# Patient Record
Sex: Male | Born: 1940 | Race: White | Hispanic: No | Marital: Married | Smoking: Former smoker
Health system: Southern US, Community
[De-identification: ages and names within clinical notes are randomized; demographics above are authoritative.]

## PROBLEM LIST (undated history)

## (undated) DIAGNOSIS — R05 Cough: Secondary | ICD-10-CM

## (undated) DIAGNOSIS — E785 Hyperlipidemia, unspecified: Secondary | ICD-10-CM

## (undated) DIAGNOSIS — I10 Essential (primary) hypertension: Secondary | ICD-10-CM

## (undated) DIAGNOSIS — I2510 Atherosclerotic heart disease of native coronary artery without angina pectoris: Secondary | ICD-10-CM

## (undated) DIAGNOSIS — Z86010 Personal history of colonic polyps: Secondary | ICD-10-CM

## (undated) DIAGNOSIS — R011 Cardiac murmur, unspecified: Secondary | ICD-10-CM

## (undated) DIAGNOSIS — I4891 Unspecified atrial fibrillation: Secondary | ICD-10-CM

## (undated) DIAGNOSIS — IMO0002 Reserved for concepts with insufficient information to code with codable children: Secondary | ICD-10-CM

## (undated) DIAGNOSIS — M545 Low back pain: Secondary | ICD-10-CM

## (undated) DIAGNOSIS — C801 Malignant (primary) neoplasm, unspecified: Secondary | ICD-10-CM

## (undated) DIAGNOSIS — M1990 Unspecified osteoarthritis, unspecified site: Secondary | ICD-10-CM

## (undated) DIAGNOSIS — N320 Bladder-neck obstruction: Secondary | ICD-10-CM

## (undated) DIAGNOSIS — J309 Allergic rhinitis, unspecified: Secondary | ICD-10-CM

## (undated) DIAGNOSIS — K219 Gastro-esophageal reflux disease without esophagitis: Secondary | ICD-10-CM

## (undated) DIAGNOSIS — R110 Nausea: Secondary | ICD-10-CM

## (undated) DIAGNOSIS — Z85828 Personal history of other malignant neoplasm of skin: Secondary | ICD-10-CM

## (undated) DIAGNOSIS — R9389 Abnormal findings on diagnostic imaging of other specified body structures: Secondary | ICD-10-CM

## (undated) DIAGNOSIS — R0609 Other forms of dyspnea: Secondary | ICD-10-CM

## (undated) DIAGNOSIS — R498 Other voice and resonance disorders: Secondary | ICD-10-CM

## (undated) DIAGNOSIS — IMO0001 Reserved for inherently not codable concepts without codable children: Secondary | ICD-10-CM

## (undated) DIAGNOSIS — R002 Palpitations: Secondary | ICD-10-CM

## (undated) DIAGNOSIS — J310 Chronic rhinitis: Secondary | ICD-10-CM

## (undated) DIAGNOSIS — R7309 Other abnormal glucose: Secondary | ICD-10-CM

## (undated) DIAGNOSIS — J338 Other polyp of sinus: Secondary | ICD-10-CM

## (undated) DIAGNOSIS — R0989 Other specified symptoms and signs involving the circulatory and respiratory systems: Secondary | ICD-10-CM

## (undated) DIAGNOSIS — E559 Vitamin D deficiency, unspecified: Secondary | ICD-10-CM

## (undated) DIAGNOSIS — R1011 Right upper quadrant pain: Secondary | ICD-10-CM

## (undated) DIAGNOSIS — T7840XA Allergy, unspecified, initial encounter: Secondary | ICD-10-CM

## (undated) DIAGNOSIS — K4090 Unilateral inguinal hernia, without obstruction or gangrene, not specified as recurrent: Secondary | ICD-10-CM

## (undated) DIAGNOSIS — B079 Viral wart, unspecified: Secondary | ICD-10-CM

## (undated) DIAGNOSIS — T50995A Adverse effect of other drugs, medicaments and biological substances, initial encounter: Secondary | ICD-10-CM

## (undated) DIAGNOSIS — R42 Dizziness and giddiness: Secondary | ICD-10-CM

## (undated) DIAGNOSIS — H269 Unspecified cataract: Secondary | ICD-10-CM

## (undated) DIAGNOSIS — M549 Dorsalgia, unspecified: Secondary | ICD-10-CM

## (undated) DIAGNOSIS — N400 Enlarged prostate without lower urinary tract symptoms: Secondary | ICD-10-CM

## (undated) DIAGNOSIS — I6529 Occlusion and stenosis of unspecified carotid artery: Secondary | ICD-10-CM

## (undated) DIAGNOSIS — K469 Unspecified abdominal hernia without obstruction or gangrene: Secondary | ICD-10-CM

## (undated) HISTORY — PX: BONY PELVIS SURGERY: SHX572

## (undated) HISTORY — PX: POLYPECTOMY: SHX149

## (undated) HISTORY — DX: Other specified symptoms and signs involving the circulatory and respiratory systems: R09.89

## (undated) HISTORY — PX: OTHER SURGICAL HISTORY: SHX169

## (undated) HISTORY — DX: Unilateral inguinal hernia, without obstruction or gangrene, not specified as recurrent: K40.90

## (undated) HISTORY — DX: Unspecified abdominal hernia without obstruction or gangrene: K46.9

## (undated) HISTORY — DX: Chronic rhinitis: J31.0

## (undated) HISTORY — DX: Personal history of colonic polyps: Z86.010

## (undated) HISTORY — DX: Essential (primary) hypertension: I10

## (undated) HISTORY — PX: HERNIA REPAIR: SHX51

## (undated) HISTORY — DX: Abnormal findings on diagnostic imaging of other specified body structures: R93.89

## (undated) HISTORY — DX: Occlusion and stenosis of unspecified carotid artery: I65.29

## (undated) HISTORY — DX: Dorsalgia, unspecified: M54.9

## (undated) HISTORY — DX: Adverse effect of other drugs, medicaments and biological substances, initial encounter: T50.995A

## (undated) HISTORY — DX: Vitamin D deficiency, unspecified: E55.9

## (undated) HISTORY — DX: Unspecified cataract: H26.9

## (undated) HISTORY — DX: Malignant (primary) neoplasm, unspecified: C80.1

## (undated) HISTORY — DX: Dizziness and giddiness: R42

## (undated) HISTORY — DX: Other voice and resonance disorders: R49.8

## (undated) HISTORY — DX: Personal history of other malignant neoplasm of skin: Z85.828

## (undated) HISTORY — DX: Atherosclerotic heart disease of native coronary artery without angina pectoris: I25.10

## (undated) HISTORY — DX: Other polyp of sinus: J33.8

## (undated) HISTORY — PX: COLONOSCOPY: SHX174

## (undated) HISTORY — PX: TONSILLECTOMY: SUR1361

## (undated) HISTORY — DX: Right upper quadrant pain: R10.11

## (undated) HISTORY — DX: Benign prostatic hyperplasia without lower urinary tract symptoms: N40.0

## (undated) HISTORY — DX: Gastro-esophageal reflux disease without esophagitis: K21.9

## (undated) HISTORY — DX: Reserved for inherently not codable concepts without codable children: IMO0001

## (undated) HISTORY — DX: Hyperlipidemia, unspecified: E78.5

## (undated) HISTORY — DX: Allergic rhinitis, unspecified: J30.9

## (undated) HISTORY — PX: CATARACT EXTRACTION: SUR2

## (undated) HISTORY — DX: Nausea: R11.0

## (undated) HISTORY — DX: Reserved for concepts with insufficient information to code with codable children: IMO0002

## (undated) HISTORY — DX: Unspecified atrial fibrillation: I48.91

## (undated) HISTORY — DX: Allergy, unspecified, initial encounter: T78.40XA

## (undated) HISTORY — DX: Cardiac murmur, unspecified: R01.1

## (undated) HISTORY — DX: Viral wart, unspecified: B07.9

## (undated) HISTORY — DX: Low back pain: M54.5

## (undated) HISTORY — DX: Cough: R05

## (undated) HISTORY — PX: LUMBAR SPINE SURGERY: SHX701

## (undated) HISTORY — PX: BACK SURGERY: SHX140

## (undated) HISTORY — DX: Palpitations: R00.2

## (undated) HISTORY — DX: Other forms of dyspnea: R06.09

## (undated) HISTORY — DX: Bladder-neck obstruction: N32.0

## (undated) HISTORY — DX: Other abnormal glucose: R73.09

## (undated) HISTORY — DX: Unspecified osteoarthritis, unspecified site: M19.90

---

## 1992-08-06 DIAGNOSIS — C4491 Basal cell carcinoma of skin, unspecified: Secondary | ICD-10-CM

## 1992-08-06 HISTORY — DX: Basal cell carcinoma of skin, unspecified: C44.91

## 1992-09-13 DIAGNOSIS — D229 Melanocytic nevi, unspecified: Secondary | ICD-10-CM

## 1992-09-13 HISTORY — DX: Melanocytic nevi, unspecified: D22.9

## 1992-10-08 DIAGNOSIS — D229 Melanocytic nevi, unspecified: Secondary | ICD-10-CM

## 1992-10-08 HISTORY — DX: Melanocytic nevi, unspecified: D22.9

## 2004-03-01 ENCOUNTER — Emergency Department (HOSPITAL_COMMUNITY): Admission: EM | Admit: 2004-03-01 | Discharge: 2004-03-01 | Payer: Self-pay | Admitting: Emergency Medicine

## 2004-03-03 ENCOUNTER — Emergency Department (HOSPITAL_COMMUNITY): Admission: EM | Admit: 2004-03-03 | Discharge: 2004-03-03 | Payer: Self-pay | Admitting: Emergency Medicine

## 2005-04-12 ENCOUNTER — Ambulatory Visit: Payer: Self-pay | Admitting: Internal Medicine

## 2005-04-20 ENCOUNTER — Ambulatory Visit: Payer: Self-pay | Admitting: Internal Medicine

## 2005-05-02 ENCOUNTER — Ambulatory Visit: Payer: Self-pay | Admitting: Internal Medicine

## 2005-06-20 ENCOUNTER — Ambulatory Visit: Payer: Self-pay | Admitting: Internal Medicine

## 2005-06-23 ENCOUNTER — Ambulatory Visit: Payer: Self-pay | Admitting: Internal Medicine

## 2005-11-21 ENCOUNTER — Ambulatory Visit: Payer: Self-pay | Admitting: Internal Medicine

## 2005-12-06 ENCOUNTER — Ambulatory Visit: Payer: Self-pay | Admitting: Internal Medicine

## 2006-01-18 ENCOUNTER — Ambulatory Visit: Payer: Self-pay | Admitting: Internal Medicine

## 2006-04-19 ENCOUNTER — Ambulatory Visit: Payer: Self-pay | Admitting: Internal Medicine

## 2006-07-17 ENCOUNTER — Ambulatory Visit: Payer: Self-pay | Admitting: Internal Medicine

## 2006-07-30 ENCOUNTER — Encounter: Admission: RE | Admit: 2006-07-30 | Discharge: 2006-07-30 | Payer: Self-pay | Admitting: Internal Medicine

## 2006-08-07 ENCOUNTER — Encounter: Payer: Self-pay | Admitting: Internal Medicine

## 2006-08-08 ENCOUNTER — Ambulatory Visit: Payer: Self-pay | Admitting: Internal Medicine

## 2006-10-05 ENCOUNTER — Ambulatory Visit: Payer: Self-pay | Admitting: Internal Medicine

## 2007-01-04 ENCOUNTER — Ambulatory Visit: Payer: Self-pay | Admitting: Internal Medicine

## 2007-01-04 ENCOUNTER — Encounter: Admission: RE | Admit: 2007-01-04 | Discharge: 2007-01-04 | Payer: Self-pay | Admitting: Internal Medicine

## 2007-05-22 ENCOUNTER — Ambulatory Visit: Payer: Self-pay | Admitting: Internal Medicine

## 2007-05-28 ENCOUNTER — Ambulatory Visit: Payer: Self-pay | Admitting: Internal Medicine

## 2007-05-28 LAB — CONVERTED CEMR LAB
ALT: 41 units/L — ABNORMAL HIGH (ref 0–40)
AST: 23 units/L (ref 0–37)
Albumin: 3.9 g/dL (ref 3.5–5.2)
Alkaline Phosphatase: 57 units/L (ref 39–117)
BUN: 14 mg/dL (ref 6–23)
Basophils Absolute: 0 10*3/uL (ref 0.0–0.1)
Calcium: 9.4 mg/dL (ref 8.4–10.5)
Chloride: 108 meq/L (ref 96–112)
Eosinophils Absolute: 0.3 10*3/uL (ref 0.0–0.6)
Eosinophils Relative: 4.3 % (ref 0.0–5.0)
GFR calc non Af Amer: 120 mL/min
HDL: 29.2 mg/dL — ABNORMAL LOW (ref >39.0)
MCHC: 34.7 g/dL (ref 30.0–36.0)
MCV: 94.4 fL (ref 78.0–100.0)
Platelets: 224 10*3/uL (ref 150–400)
RBC: 4.82 M/uL (ref 4.22–5.81)
Triglycerides: 138 mg/dL (ref 0–149)
WBC: 6.9 10*3/uL (ref 4.5–10.5)

## 2007-07-23 ENCOUNTER — Ambulatory Visit: Payer: Self-pay | Admitting: Internal Medicine

## 2007-07-23 DIAGNOSIS — I10 Essential (primary) hypertension: Secondary | ICD-10-CM

## 2007-07-23 DIAGNOSIS — J309 Allergic rhinitis, unspecified: Secondary | ICD-10-CM

## 2007-07-23 HISTORY — DX: Allergic rhinitis, unspecified: J30.9

## 2007-07-23 HISTORY — DX: Essential (primary) hypertension: I10

## 2007-07-25 ENCOUNTER — Encounter: Payer: Self-pay | Admitting: Internal Medicine

## 2007-07-25 ENCOUNTER — Ambulatory Visit: Payer: Self-pay

## 2007-07-25 DIAGNOSIS — E785 Hyperlipidemia, unspecified: Secondary | ICD-10-CM | POA: Insufficient documentation

## 2007-07-25 DIAGNOSIS — N400 Enlarged prostate without lower urinary tract symptoms: Secondary | ICD-10-CM

## 2007-07-25 DIAGNOSIS — K219 Gastro-esophageal reflux disease without esophagitis: Secondary | ICD-10-CM

## 2007-07-25 HISTORY — DX: Gastro-esophageal reflux disease without esophagitis: K21.9

## 2007-07-25 HISTORY — DX: Hyperlipidemia, unspecified: E78.5

## 2007-07-25 HISTORY — DX: Benign prostatic hyperplasia without lower urinary tract symptoms: N40.0

## 2007-07-31 ENCOUNTER — Ambulatory Visit: Payer: Self-pay | Admitting: Internal Medicine

## 2007-08-03 LAB — CONVERTED CEMR LAB
ALT: 44 units/L (ref 0–53)
AST: 25 units/L (ref 0–37)
BUN: 18 mg/dL (ref 6–23)
GFR calc Af Amer: 145 mL/min
HDL: 37.1 mg/dL — ABNORMAL LOW (ref >39.0)
Potassium: 4.3 meq/L (ref 3.5–5.1)
Sodium: 140 meq/L (ref 135–145)
Triglycerides: 78 mg/dL (ref 0–149)
VLDL: 16 mg/dL (ref 0–40)

## 2007-08-05 ENCOUNTER — Telehealth: Payer: Self-pay | Admitting: Internal Medicine

## 2007-08-19 ENCOUNTER — Ambulatory Visit: Payer: Self-pay | Admitting: Internal Medicine

## 2007-08-19 DIAGNOSIS — R9389 Abnormal findings on diagnostic imaging of other specified body structures: Secondary | ICD-10-CM | POA: Insufficient documentation

## 2007-08-19 DIAGNOSIS — T887XXA Unspecified adverse effect of drug or medicament, initial encounter: Secondary | ICD-10-CM | POA: Insufficient documentation

## 2007-08-19 HISTORY — DX: Abnormal findings on diagnostic imaging of other specified body structures: R93.89

## 2007-10-14 ENCOUNTER — Telehealth: Payer: Self-pay | Admitting: Internal Medicine

## 2007-10-18 ENCOUNTER — Ambulatory Visit: Payer: Self-pay | Admitting: Internal Medicine

## 2007-10-18 DIAGNOSIS — Z860100 Personal history of colon polyps, unspecified: Secondary | ICD-10-CM | POA: Insufficient documentation

## 2007-10-18 DIAGNOSIS — Z85828 Personal history of other malignant neoplasm of skin: Secondary | ICD-10-CM | POA: Insufficient documentation

## 2007-10-18 DIAGNOSIS — IMO0002 Reserved for concepts with insufficient information to code with codable children: Secondary | ICD-10-CM | POA: Insufficient documentation

## 2007-10-18 HISTORY — DX: Personal history of other malignant neoplasm of skin: Z85.828

## 2007-10-18 HISTORY — DX: Personal history of colonic polyps: Z86.010

## 2007-10-18 HISTORY — DX: Reserved for concepts with insufficient information to code with codable children: IMO0002

## 2007-10-18 HISTORY — DX: Personal history of colon polyps, unspecified: Z86.0100

## 2007-11-08 ENCOUNTER — Ambulatory Visit: Payer: Self-pay | Admitting: Cardiology

## 2007-12-10 ENCOUNTER — Telehealth: Payer: Self-pay | Admitting: Internal Medicine

## 2007-12-26 HISTORY — PX: CORONARY ARTERY BYPASS GRAFT: SHX141

## 2007-12-31 ENCOUNTER — Ambulatory Visit: Payer: Self-pay | Admitting: Internal Medicine

## 2007-12-31 DIAGNOSIS — R7309 Other abnormal glucose: Secondary | ICD-10-CM | POA: Insufficient documentation

## 2007-12-31 DIAGNOSIS — B079 Viral wart, unspecified: Secondary | ICD-10-CM | POA: Insufficient documentation

## 2007-12-31 HISTORY — DX: Other abnormal glucose: R73.09

## 2007-12-31 HISTORY — DX: Viral wart, unspecified: B07.9

## 2008-03-16 ENCOUNTER — Telehealth: Payer: Self-pay | Admitting: *Deleted

## 2008-03-23 ENCOUNTER — Telehealth: Payer: Self-pay | Admitting: Internal Medicine

## 2008-03-31 ENCOUNTER — Ambulatory Visit: Payer: Self-pay | Admitting: Internal Medicine

## 2008-04-02 LAB — CONVERTED CEMR LAB
CO2: 26 meq/L (ref 19–32)
CRP, High Sensitivity: 2 (ref 0.00–5.00)
Direct LDL: 187.2 mg/dL
GFR calc Af Amer: 144 mL/min
Glucose, Bld: 59 mg/dL — ABNORMAL LOW (ref 70–99)
HDL: 40.8 mg/dL (ref >39.0)
Hgb A1c MFr Bld: 5.7 % (ref 4.6–6.0)
Potassium: 4 meq/L (ref 3.5–5.1)
Sodium: 140 meq/L (ref 135–145)
Total CHOL/HDL Ratio: 5.8

## 2008-04-15 ENCOUNTER — Ambulatory Visit: Payer: Self-pay | Admitting: Internal Medicine

## 2008-04-15 DIAGNOSIS — T50995A Adverse effect of other drugs, medicaments and biological substances, initial encounter: Secondary | ICD-10-CM

## 2008-04-15 HISTORY — DX: Adverse effect of other drugs, medicaments and biological substances, initial encounter: T50.995A

## 2008-05-08 ENCOUNTER — Telehealth: Payer: Self-pay | Admitting: *Deleted

## 2008-05-11 ENCOUNTER — Ambulatory Visit: Payer: Self-pay | Admitting: Internal Medicine

## 2008-05-11 DIAGNOSIS — J019 Acute sinusitis, unspecified: Secondary | ICD-10-CM | POA: Insufficient documentation

## 2008-08-03 ENCOUNTER — Telehealth: Payer: Self-pay | Admitting: *Deleted

## 2008-08-06 ENCOUNTER — Ambulatory Visit: Payer: Self-pay | Admitting: Internal Medicine

## 2008-08-06 LAB — CONVERTED CEMR LAB
ALT: 31 units/L (ref 0–53)
AST: 19 units/L (ref 0–37)
Alkaline Phosphatase: 57 units/L (ref 39–117)
Bilirubin, Direct: 0.1 mg/dL (ref 0.0–0.3)
HDL: 33.1 mg/dL — ABNORMAL LOW (ref >39.0)
Total Bilirubin: 0.9 mg/dL (ref 0.3–1.2)
Total CHOL/HDL Ratio: 5.7

## 2008-08-21 ENCOUNTER — Ambulatory Visit: Payer: Self-pay | Admitting: Internal Medicine

## 2008-08-21 DIAGNOSIS — R0989 Other specified symptoms and signs involving the circulatory and respiratory systems: Secondary | ICD-10-CM

## 2008-08-21 DIAGNOSIS — R0609 Other forms of dyspnea: Secondary | ICD-10-CM | POA: Insufficient documentation

## 2008-08-21 HISTORY — DX: Other specified symptoms and signs involving the circulatory and respiratory systems: R09.89

## 2008-08-21 HISTORY — DX: Other forms of dyspnea: R06.09

## 2008-08-24 ENCOUNTER — Ambulatory Visit: Payer: Self-pay | Admitting: Cardiovascular Disease

## 2008-09-01 ENCOUNTER — Ambulatory Visit: Payer: Self-pay | Admitting: Internal Medicine

## 2008-09-07 ENCOUNTER — Ambulatory Visit: Payer: Self-pay

## 2008-09-07 ENCOUNTER — Ambulatory Visit: Payer: Self-pay | Admitting: Cardiovascular Disease

## 2008-09-07 ENCOUNTER — Encounter: Payer: Self-pay | Admitting: Internal Medicine

## 2008-09-07 ENCOUNTER — Encounter: Payer: Self-pay | Admitting: Cardiovascular Disease

## 2008-09-07 LAB — CONVERTED CEMR LAB
BUN: 13 mg/dL (ref 6–23)
Basophils Absolute: 0.1 10*3/uL (ref 0.0–0.1)
Basophils Relative: 0.9 % (ref 0.0–3.0)
Calcium: 9 mg/dL (ref 8.4–10.5)
Chloride: 112 meq/L (ref 96–112)
Creatinine, Ser: 0.7 mg/dL (ref 0.4–1.5)
Eosinophils Absolute: 0.2 10*3/uL (ref 0.0–0.7)
Eosinophils Relative: 3 % (ref 0.0–5.0)
GFR calc non Af Amer: 119 mL/min
HCT: 45.1 % (ref 39.0–52.0)
Hemoglobin: 15.8 g/dL (ref 13.0–17.0)
MCHC: 35.1 g/dL (ref 30.0–36.0)
MCV: 95.7 fL (ref 78.0–100.0)
Monocytes Absolute: 0.7 10*3/uL (ref 0.1–1.0)
Neutro Abs: 3.4 10*3/uL (ref 1.4–7.7)
RBC: 4.71 M/uL (ref 4.22–5.81)
WBC: 5.9 10*3/uL (ref 4.5–10.5)

## 2008-09-09 ENCOUNTER — Ambulatory Visit: Payer: Self-pay | Admitting: Cardiovascular Disease

## 2008-09-09 ENCOUNTER — Ambulatory Visit: Payer: Self-pay | Admitting: Cardiology

## 2008-09-09 ENCOUNTER — Inpatient Hospital Stay (HOSPITAL_BASED_OUTPATIENT_CLINIC_OR_DEPARTMENT_OTHER): Admission: RE | Admit: 2008-09-09 | Discharge: 2008-09-09 | Payer: Self-pay | Admitting: Cardiovascular Disease

## 2008-09-09 ENCOUNTER — Ambulatory Visit: Payer: Self-pay | Admitting: Thoracic Surgery (Cardiothoracic Vascular Surgery)

## 2008-09-09 ENCOUNTER — Inpatient Hospital Stay (HOSPITAL_COMMUNITY): Admission: AD | Admit: 2008-09-09 | Discharge: 2008-09-16 | Payer: Self-pay | Admitting: Cardiology

## 2008-09-10 ENCOUNTER — Encounter: Payer: Self-pay | Admitting: Cardiology

## 2008-09-15 ENCOUNTER — Telehealth: Payer: Self-pay | Admitting: Internal Medicine

## 2008-09-21 ENCOUNTER — Ambulatory Visit: Payer: Self-pay | Admitting: Thoracic Surgery (Cardiothoracic Vascular Surgery)

## 2008-10-05 ENCOUNTER — Ambulatory Visit: Payer: Self-pay | Admitting: Cardiovascular Disease

## 2008-10-12 ENCOUNTER — Ambulatory Visit: Payer: Self-pay | Admitting: Thoracic Surgery (Cardiothoracic Vascular Surgery)

## 2008-10-12 ENCOUNTER — Encounter
Admission: RE | Admit: 2008-10-12 | Discharge: 2008-10-12 | Payer: Self-pay | Admitting: Thoracic Surgery (Cardiothoracic Vascular Surgery)

## 2008-10-15 ENCOUNTER — Encounter (HOSPITAL_COMMUNITY): Admission: RE | Admit: 2008-10-15 | Discharge: 2008-12-23 | Payer: Self-pay | Admitting: Cardiovascular Disease

## 2008-10-22 ENCOUNTER — Ambulatory Visit: Payer: Self-pay | Admitting: Thoracic Surgery (Cardiothoracic Vascular Surgery)

## 2008-11-02 ENCOUNTER — Ambulatory Visit: Payer: Self-pay | Admitting: Thoracic Surgery (Cardiothoracic Vascular Surgery)

## 2008-12-25 ENCOUNTER — Encounter (HOSPITAL_COMMUNITY): Admission: RE | Admit: 2008-12-25 | Discharge: 2009-03-08 | Payer: Self-pay | Admitting: Cardiovascular Disease

## 2009-01-07 ENCOUNTER — Ambulatory Visit: Payer: Self-pay | Admitting: Cardiovascular Disease

## 2009-01-22 ENCOUNTER — Ambulatory Visit: Payer: Self-pay | Admitting: Internal Medicine

## 2009-01-22 DIAGNOSIS — IMO0001 Reserved for inherently not codable concepts without codable children: Secondary | ICD-10-CM

## 2009-01-22 DIAGNOSIS — I2510 Atherosclerotic heart disease of native coronary artery without angina pectoris: Secondary | ICD-10-CM | POA: Insufficient documentation

## 2009-01-22 DIAGNOSIS — R1011 Right upper quadrant pain: Secondary | ICD-10-CM

## 2009-01-22 DIAGNOSIS — R42 Dizziness and giddiness: Secondary | ICD-10-CM

## 2009-01-22 HISTORY — DX: Reserved for inherently not codable concepts without codable children: IMO0001

## 2009-01-22 HISTORY — DX: Atherosclerotic heart disease of native coronary artery without angina pectoris: I25.10

## 2009-01-22 HISTORY — DX: Right upper quadrant pain: R10.11

## 2009-01-22 HISTORY — DX: Dizziness and giddiness: R42

## 2009-01-26 ENCOUNTER — Encounter: Payer: Self-pay | Admitting: Internal Medicine

## 2009-01-26 LAB — CONVERTED CEMR LAB
Alkaline Phosphatase: 58 units/L (ref 39–117)
Basophils Absolute: 0.1 10*3/uL (ref 0.0–0.1)
Bilirubin, Direct: 0.2 mg/dL (ref 0.0–0.3)
Cholesterol: 136 mg/dL (ref 0–200)
GFR calc Af Amer: 144 mL/min
GFR calc non Af Amer: 119 mL/min
Glucose, Bld: 98 mg/dL (ref 70–99)
Hemoglobin: 15.8 g/dL (ref 13.0–17.0)
Lymphocytes Relative: 26.4 % (ref 12.0–46.0)
MCHC: 33.9 g/dL (ref 30.0–36.0)
MCV: 94.3 fL (ref 78.0–100.0)
Monocytes Absolute: 0.7 10*3/uL (ref 0.1–1.0)
Neutrophils Relative %: 55.9 % (ref 43.0–77.0)
Potassium: 4.1 meq/L (ref 3.5–5.1)
RBC: 4.93 M/uL (ref 4.22–5.81)
Sodium: 141 meq/L (ref 135–145)
Total Bilirubin: 1.1 mg/dL (ref 0.3–1.2)
VLDL: 13 mg/dL (ref 0–40)
WBC: 5.6 10*3/uL (ref 4.5–10.5)

## 2009-01-27 ENCOUNTER — Telehealth: Payer: Self-pay | Admitting: *Deleted

## 2009-02-01 ENCOUNTER — Telehealth: Payer: Self-pay | Admitting: Internal Medicine

## 2009-02-15 ENCOUNTER — Encounter: Payer: Self-pay | Admitting: Internal Medicine

## 2009-03-16 ENCOUNTER — Emergency Department (HOSPITAL_COMMUNITY): Admission: EM | Admit: 2009-03-16 | Discharge: 2009-03-16 | Payer: Self-pay | Admitting: Emergency Medicine

## 2009-03-23 ENCOUNTER — Ambulatory Visit: Payer: Self-pay | Admitting: Internal Medicine

## 2009-04-01 ENCOUNTER — Ambulatory Visit: Payer: Self-pay

## 2009-04-01 ENCOUNTER — Encounter: Payer: Self-pay | Admitting: Internal Medicine

## 2009-04-27 ENCOUNTER — Telehealth: Payer: Self-pay | Admitting: Cardiovascular Disease

## 2009-05-27 ENCOUNTER — Ambulatory Visit: Payer: Self-pay | Admitting: Internal Medicine

## 2009-05-28 LAB — CONVERTED CEMR LAB
CO2: 29 meq/L (ref 19–32)
Chloride: 108 meq/L (ref 96–112)
Glucose, Bld: 106 mg/dL — ABNORMAL HIGH (ref 70–99)
Potassium: 4 meq/L (ref 3.5–5.1)
Sodium: 142 meq/L (ref 135–145)

## 2009-07-06 DIAGNOSIS — I4891 Unspecified atrial fibrillation: Secondary | ICD-10-CM | POA: Insufficient documentation

## 2009-07-06 DIAGNOSIS — K469 Unspecified abdominal hernia without obstruction or gangrene: Secondary | ICD-10-CM

## 2009-07-06 HISTORY — DX: Unspecified atrial fibrillation: I48.91

## 2009-07-06 HISTORY — DX: Unspecified abdominal hernia without obstruction or gangrene: K46.9

## 2009-07-07 ENCOUNTER — Ambulatory Visit: Payer: Self-pay | Admitting: Cardiovascular Disease

## 2009-07-07 DIAGNOSIS — N320 Bladder-neck obstruction: Secondary | ICD-10-CM | POA: Insufficient documentation

## 2009-07-07 HISTORY — DX: Bladder-neck obstruction: N32.0

## 2009-07-27 ENCOUNTER — Encounter: Payer: Self-pay | Admitting: Cardiovascular Disease

## 2009-08-25 ENCOUNTER — Telehealth: Payer: Self-pay | Admitting: *Deleted

## 2009-09-08 ENCOUNTER — Telehealth: Payer: Self-pay | Admitting: *Deleted

## 2009-09-10 ENCOUNTER — Ambulatory Visit: Payer: Self-pay | Admitting: Internal Medicine

## 2009-09-16 ENCOUNTER — Telehealth: Payer: Self-pay | Admitting: *Deleted

## 2009-09-19 IMAGING — CR DG CHEST 2V
2 series · 2 of 2 positions shown · non-contrast
Comparison: 01/18/2006

CLINICAL DATA: Short of breath.

CHEST - 2 VIEW

[view not recorded (1 of 2)]
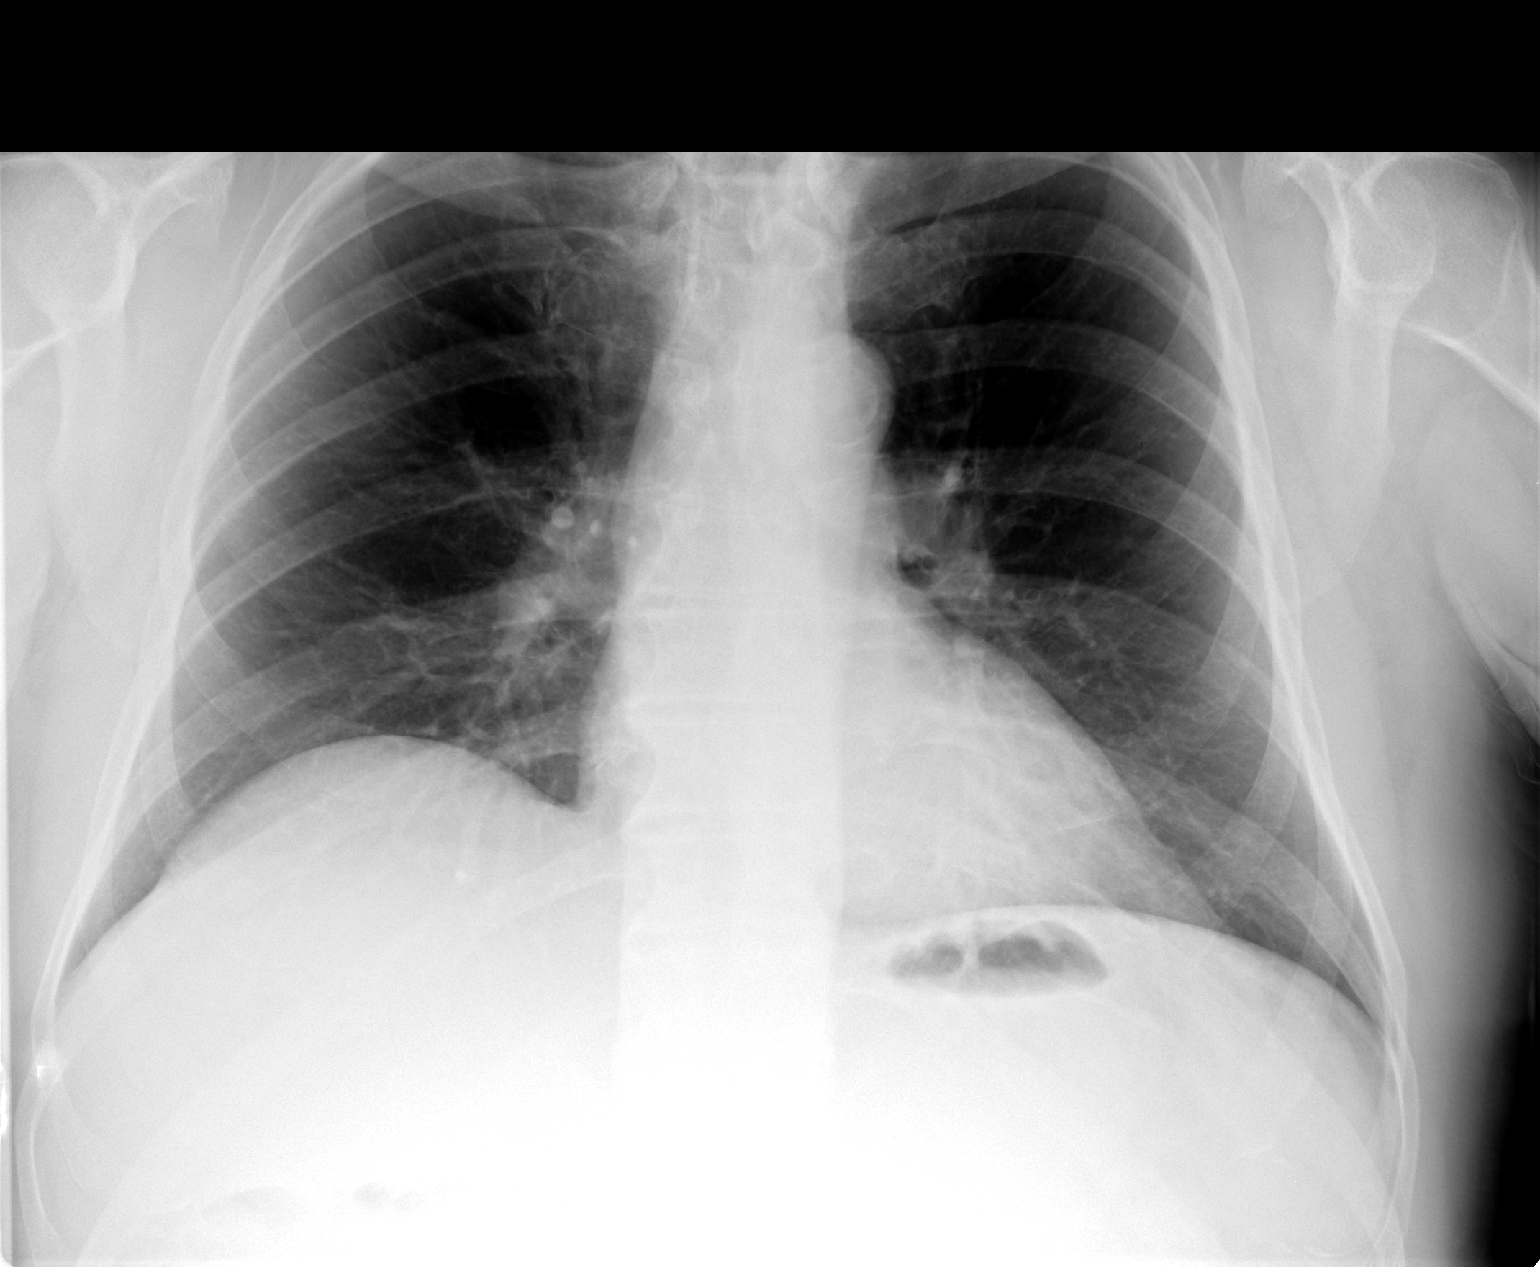

[view not recorded (2 of 2)]
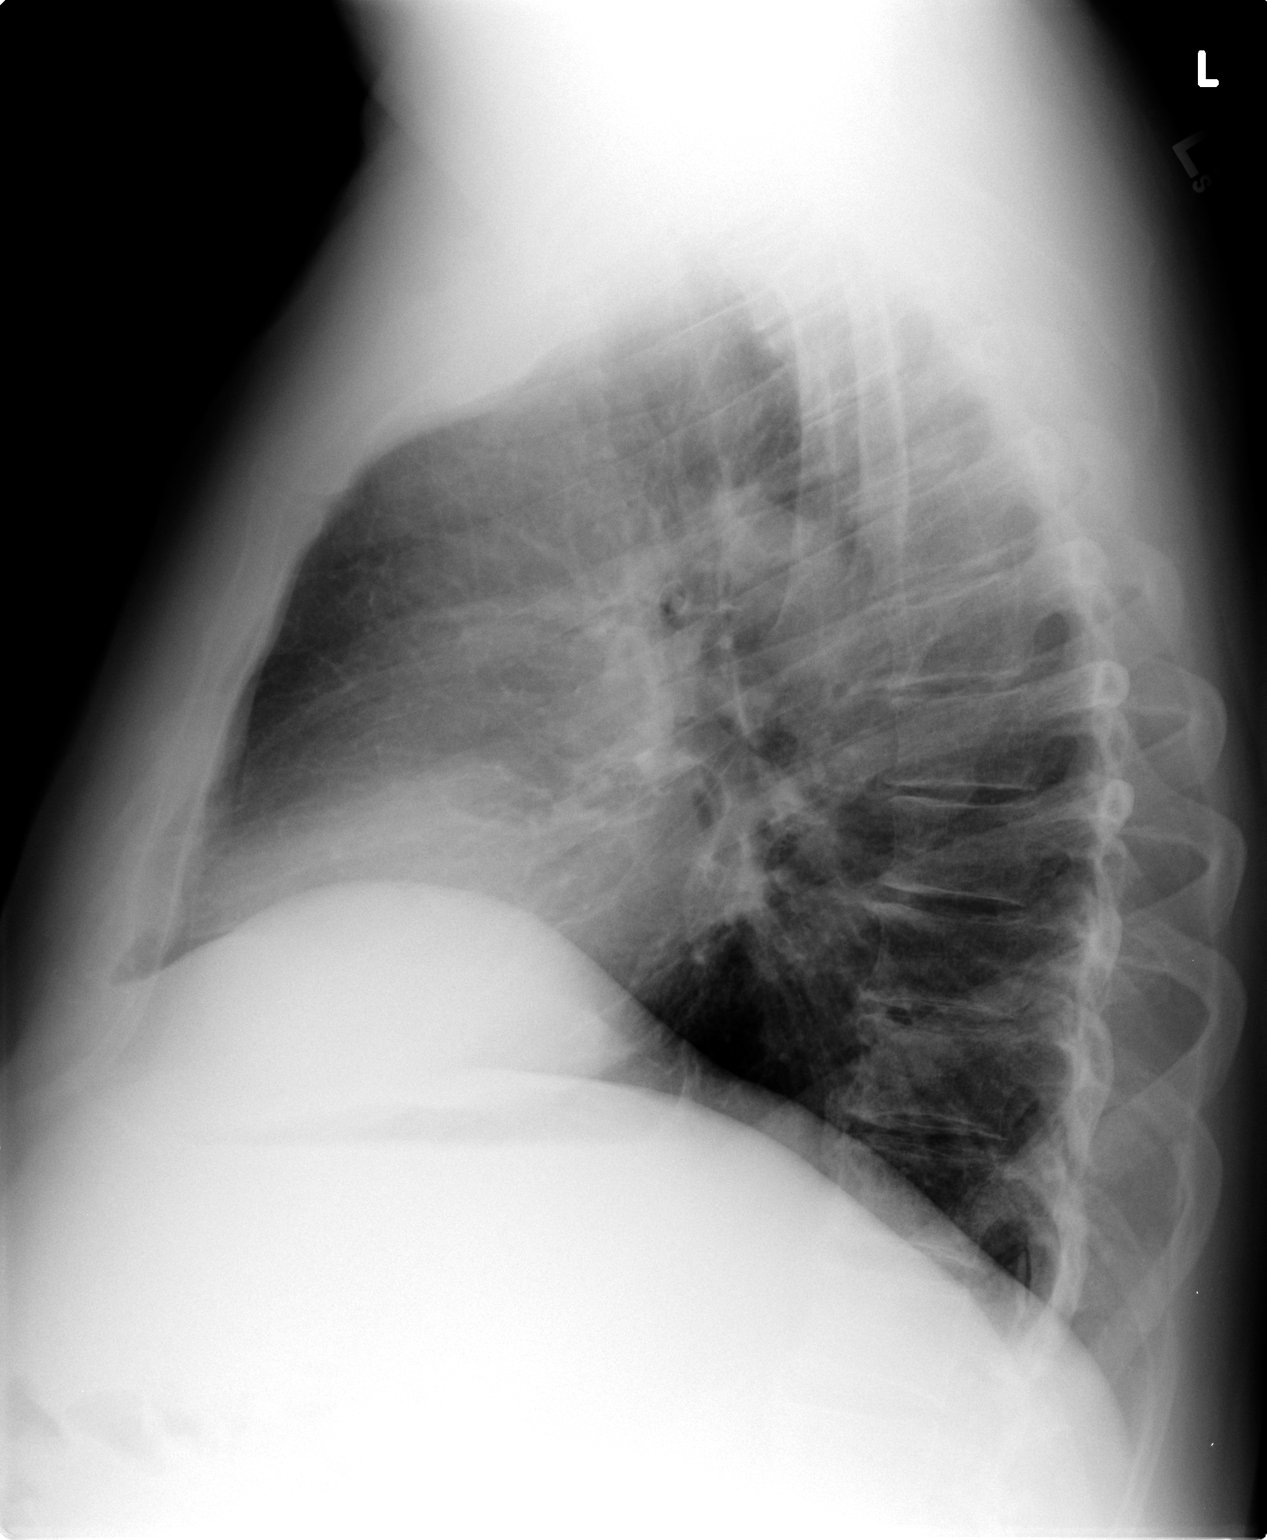

[2 of 2 positions shown; findings below may reference images not displayed]

FINDINGS: The lung volume is normal.  The heart size is normal and
there is no heart failure.  There may be mild chronic lung disease.
There is no acute infiltrate, effusion, or mass lesion.
IMPRESSION: No active cardiopulmonary disease.

## 2009-09-21 ENCOUNTER — Ambulatory Visit: Payer: Self-pay | Admitting: Family Medicine

## 2009-10-10 IMAGING — CR DG CHEST 1V PORT
1 series · 1 of 1 positions shown · non-contrast
Comparison: Preoperative exam 09/10/2008.

CLINICAL DATA: 66-year-old male status post CABG.

PORTABLE CHEST - 1 VIEW

[view not recorded]
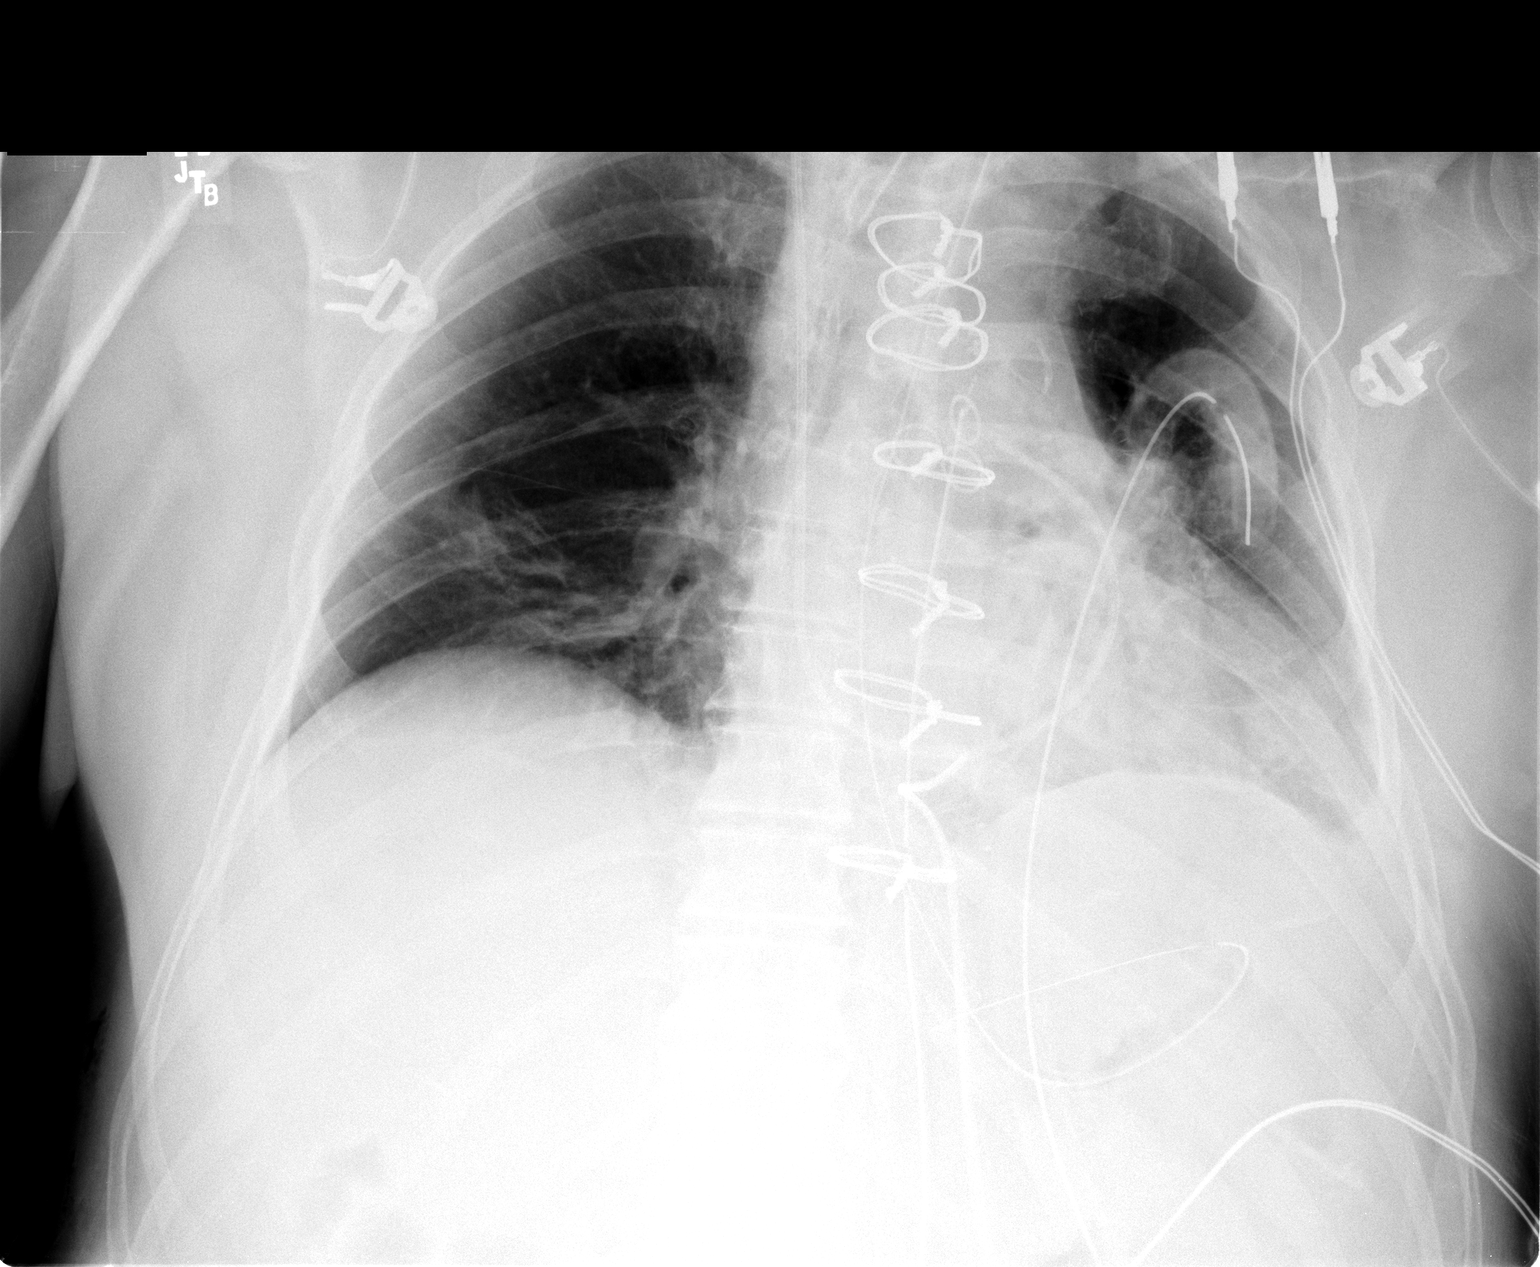

[1 of 1 positions shown; findings below may reference images not displayed]

FINDINGS: AP portable spine view at 4551 hours.  The patient is
rotated to the left.

Endotracheal tube tip projects between the level of clavicles and
carina.  Right IJ approach Swan-Ganz catheter, tip at the level of
the main pulmonary outflow tract.   Enteric tube is looped in the
region of the gastric fundus.  Two left thoracostomy tubes and a
mediastinal drain are in place.

New median sternotomy wires and CABG markers.   Bilateral pulmonary
atelectasis.  No pneumothorax.  Possible small left effusion. No
pulmonary edema. Stable cardiac size and mediastinal contours.
IMPRESSION: 1.  Support apparatus appear appropriately placed as detailed
above.
2.  Pulmonary atelectasis.  Possible trace left effusion.  No
pneumothorax identified.

## 2009-10-26 ENCOUNTER — Ambulatory Visit: Payer: Self-pay | Admitting: Internal Medicine

## 2009-10-26 LAB — CONVERTED CEMR LAB
Albumin: 4.2 g/dL (ref 3.5–5.2)
Basophils Relative: 0.7 % (ref 0.0–3.0)
CO2: 26 meq/L (ref 19–32)
Calcium: 9 mg/dL (ref 8.4–10.5)
Chloride: 107 meq/L (ref 96–112)
Eosinophils Absolute: 0.3 10*3/uL (ref 0.0–0.7)
Eosinophils Relative: 4.5 % (ref 0.0–5.0)
HDL: 39.2 mg/dL (ref >39.00)
Hemoglobin: 15.6 g/dL (ref 13.0–17.0)
LDL Cholesterol: 79 mg/dL (ref 0–99)
Lymphocytes Relative: 28.6 % (ref 12.0–46.0)
MCHC: 34.6 g/dL (ref 30.0–36.0)
MCV: 98.5 fL (ref 78.0–100.0)
Neutro Abs: 3.2 10*3/uL (ref 1.4–7.7)
RBC: 4.57 M/uL (ref 4.22–5.81)
Sodium: 141 meq/L (ref 135–145)
Total CHOL/HDL Ratio: 3
Total Protein: 6.5 g/dL (ref 6.0–8.3)
Triglycerides: 50 mg/dL (ref 0.0–149.0)
WBC: 5.9 10*3/uL (ref 4.5–10.5)

## 2009-11-02 ENCOUNTER — Ambulatory Visit: Payer: Self-pay | Admitting: Internal Medicine

## 2009-11-02 DIAGNOSIS — M549 Dorsalgia, unspecified: Secondary | ICD-10-CM

## 2009-11-02 DIAGNOSIS — E559 Vitamin D deficiency, unspecified: Secondary | ICD-10-CM | POA: Insufficient documentation

## 2009-11-02 HISTORY — DX: Dorsalgia, unspecified: M54.9

## 2009-11-02 HISTORY — DX: Vitamin D deficiency, unspecified: E55.9

## 2009-11-03 ENCOUNTER — Encounter (INDEPENDENT_AMBULATORY_CARE_PROVIDER_SITE_OTHER): Payer: Self-pay | Admitting: *Deleted

## 2009-11-05 ENCOUNTER — Encounter: Payer: Self-pay | Admitting: Cardiovascular Disease

## 2009-11-24 ENCOUNTER — Ambulatory Visit: Payer: Self-pay | Admitting: Internal Medicine

## 2009-12-27 ENCOUNTER — Telehealth: Payer: Self-pay | Admitting: *Deleted

## 2009-12-29 ENCOUNTER — Ambulatory Visit: Payer: Self-pay | Admitting: Cardiovascular Disease

## 2010-02-08 ENCOUNTER — Ambulatory Visit: Payer: Self-pay | Admitting: Cardiovascular Disease

## 2010-02-10 LAB — CONVERTED CEMR LAB
AST: 18 units/L (ref 0–37)
Albumin: 4 g/dL (ref 3.5–5.2)
HDL: 43.2 mg/dL (ref >39.00)
LDL Cholesterol: 81 mg/dL (ref 0–99)
Total Bilirubin: 0.9 mg/dL (ref 0.3–1.2)
Total CHOL/HDL Ratio: 3
Triglycerides: 84 mg/dL (ref 0.0–149.0)

## 2010-03-14 ENCOUNTER — Telehealth: Payer: Self-pay | Admitting: Internal Medicine

## 2010-04-08 ENCOUNTER — Encounter: Payer: Self-pay | Admitting: Internal Medicine

## 2010-04-08 DIAGNOSIS — I6529 Occlusion and stenosis of unspecified carotid artery: Secondary | ICD-10-CM

## 2010-04-08 HISTORY — DX: Occlusion and stenosis of unspecified carotid artery: I65.29

## 2010-04-11 ENCOUNTER — Encounter: Payer: Self-pay | Admitting: Cardiovascular Disease

## 2010-04-11 ENCOUNTER — Ambulatory Visit: Payer: Self-pay

## 2010-05-09 ENCOUNTER — Telehealth: Payer: Self-pay | Admitting: Cardiovascular Disease

## 2010-05-09 DIAGNOSIS — R002 Palpitations: Secondary | ICD-10-CM | POA: Insufficient documentation

## 2010-05-09 HISTORY — DX: Palpitations: R00.2

## 2010-05-10 ENCOUNTER — Encounter: Payer: Self-pay | Admitting: Cardiovascular Disease

## 2010-05-10 ENCOUNTER — Ambulatory Visit: Payer: Self-pay

## 2010-05-11 ENCOUNTER — Ambulatory Visit: Payer: Self-pay | Admitting: Internal Medicine

## 2010-05-11 DIAGNOSIS — J310 Chronic rhinitis: Secondary | ICD-10-CM | POA: Insufficient documentation

## 2010-05-11 HISTORY — DX: Chronic rhinitis: J31.0

## 2010-05-18 ENCOUNTER — Ambulatory Visit: Payer: Self-pay | Admitting: Cardiovascular Disease

## 2010-06-29 ENCOUNTER — Telehealth: Payer: Self-pay | Admitting: Internal Medicine

## 2010-07-13 ENCOUNTER — Encounter (INDEPENDENT_AMBULATORY_CARE_PROVIDER_SITE_OTHER): Payer: Self-pay | Admitting: *Deleted

## 2010-07-26 ENCOUNTER — Telehealth: Payer: Self-pay | Admitting: *Deleted

## 2010-07-28 ENCOUNTER — Ambulatory Visit: Payer: Self-pay | Admitting: Internal Medicine

## 2010-07-28 LAB — CONVERTED CEMR LAB: Vit D, 25-Hydroxy: 43 ng/mL (ref 30–89)

## 2010-08-10 ENCOUNTER — Ambulatory Visit: Payer: Self-pay | Admitting: Internal Medicine

## 2010-08-26 ENCOUNTER — Telehealth: Payer: Self-pay | Admitting: *Deleted

## 2010-09-15 ENCOUNTER — Ambulatory Visit: Payer: Self-pay | Admitting: Internal Medicine

## 2010-09-26 ENCOUNTER — Telehealth: Payer: Self-pay | Admitting: *Deleted

## 2010-10-25 ENCOUNTER — Telehealth: Payer: Self-pay | Admitting: Internal Medicine

## 2010-10-25 ENCOUNTER — Ambulatory Visit: Payer: Self-pay | Admitting: Internal Medicine

## 2010-10-25 DIAGNOSIS — M545 Low back pain, unspecified: Secondary | ICD-10-CM

## 2010-10-25 DIAGNOSIS — R059 Cough, unspecified: Secondary | ICD-10-CM

## 2010-10-25 DIAGNOSIS — IMO0002 Reserved for concepts with insufficient information to code with codable children: Secondary | ICD-10-CM | POA: Insufficient documentation

## 2010-10-25 DIAGNOSIS — R05 Cough: Secondary | ICD-10-CM

## 2010-10-25 HISTORY — DX: Reserved for concepts with insufficient information to code with codable children: IMO0002

## 2010-10-25 HISTORY — DX: Low back pain, unspecified: M54.50

## 2010-10-25 HISTORY — DX: Cough, unspecified: R05.9

## 2010-10-25 LAB — CONVERTED CEMR LAB
Blood in Urine, dipstick: NEGATIVE
Glucose, Urine, Semiquant: NEGATIVE
Nitrite: NEGATIVE
Specific Gravity, Urine: 1.025
pH: 5

## 2010-11-08 ENCOUNTER — Ambulatory Visit: Payer: Self-pay | Admitting: Internal Medicine

## 2010-11-09 ENCOUNTER — Encounter: Payer: Self-pay | Admitting: *Deleted

## 2010-11-09 LAB — CONVERTED CEMR LAB
BUN: 15 mg/dL (ref 6–23)
Basophils Relative: 0.9 % (ref 0.0–3.0)
Chloride: 106 meq/L (ref 96–112)
Eosinophils Relative: 7.6 % — ABNORMAL HIGH (ref 0.0–5.0)
GFR calc non Af Amer: 101.27 mL/min (ref >60)
HCT: 45.2 % (ref 39.0–52.0)
Lymphs Abs: 1.9 10*3/uL (ref 0.7–4.0)
MCV: 96.9 fL (ref 78.0–100.0)
Monocytes Absolute: 0.8 10*3/uL (ref 0.1–1.0)
Monocytes Relative: 12.2 % — ABNORMAL HIGH (ref 3.0–12.0)
Platelets: 205 10*3/uL (ref 150.0–400.0)
Potassium: 4.2 meq/L (ref 3.5–5.1)
RBC: 4.66 M/uL (ref 4.22–5.81)
Sodium: 138 meq/L (ref 135–145)
TSH: 1.07 microintl units/mL (ref 0.35–5.50)
WBC: 6.9 10*3/uL (ref 4.5–10.5)

## 2010-12-05 ENCOUNTER — Telehealth: Payer: Self-pay | Admitting: *Deleted

## 2010-12-08 ENCOUNTER — Ambulatory Visit: Payer: Self-pay | Admitting: Cardiovascular Disease

## 2011-01-22 LAB — CONVERTED CEMR LAB: Blood Glucose, Fingerstick: 101

## 2011-01-24 NOTE — Progress Notes (Signed)
Summary: REQUEST FOR INFORMATION  Phone Note Call from Patient   Caller: Spouse Summary of Call: Pts wife called in to speak with Carollee Herter, CMA..... Rishab has had hx of skin ca and they need the name of a dermatologist that Dr Fabian Sharp would recommend him to go for eval of leision on back..... Adv either pts cardiologist or  Dr Fabian Sharp sent pt to a male dermatologist years ago and pt was not satisfied with that physician.... would like info for another physician (dermatology) that Dr Fabian Sharp would recommend.  Pt can be reached at  [phone].  Initial call taken by: Debbra Riding,  September 26, 2010 9:33 AM  Follow-up for Phone Call        wife called again for status. Follow-up by: Warnell Forester,  September 27, 2010 8:09 AM  Additional Follow-up for Phone Call Additional follow up Details #1::        Dr  Donzetta Starch,  DrHarrison Turner , Dr Janalyn Harder. Dr Terri Piedra     to start .    Additional Follow-up by: Madelin Headings MD,  September 27, 2010 12:42 PM    Additional Follow-up for Phone Call Additional follow up Details #2::    Pt's wife aware of this.  Follow-up by: Romualdo Bolk, CMA (AAMA),  September 27, 2010 1:51 PM

## 2011-01-24 NOTE — Assessment & Plan Note (Signed)
Summary: follow up/ssc   Vital Signs:  Patient profile:   70 year old male Weight:      211 pounds BP sitting:   110 / 70  (right arm) CC: FU meds Tamsulosin has to be documented for Ins, Hypertension Management   History of Present Illness: [name] comesin with wife today for multiple medical problems . THey are going to [place] until July  BPH and prostate problems : had seen urology and given          but above med is just as good  and   generic.  CHronic sinusitis  ongoing waxing anwaning   plus allergie . Going to [place] .   chronic pnd . Nose drips no fever.  CV : fluttering in chest  has seen Dr Eden Emms and to have monitor eval in a few days before travel no associated signs .  No cp or sob.  CAD no cp sob.   Hypertension History:      He complains of palpitations, but denies headache, chest pain, peripheral edema, visual symptoms, neurologic problems, syncope, and side effects from treatment.  He notes no problems with any antihypertensive medication side effects.        Positive major cardiovascular risk factors include male age 27 years old or older, hyperlipidemia, and hypertension.  Negative major cardiovascular risk factors include non-tobacco-user status.        Positive history for target organ damage include ASHD (either angina/prior MI/prior CABG).  Further assessment for target organ damage reveals no history of cardiac end-organ damage (CHF/LVH) or renal insufficiency.     Preventive Screening-Counseling & Management  Alcohol-Tobacco     Alcohol drinks/day: <1     Alcohol type: beer     Smoking Status: never  Caffeine-Diet-Exercise     Caffeine use/day: 1     Does Patient Exercise: no  Current Medications (verified): 1)  Baby Aspirin 81 Mg  Chew (Aspirin) 2)  Aciphex 20 Mg  Tbec (Rabeprazole Sodium) .Marland Kitchen.. 1 By Mouth Once Daily 3)  Singulair 10 Mg  Tabs (Montelukast Sodium) .Marland Kitchen.. 1 By Mouth Once Daily 4)  Flomax 0.4 Mg  Cp24 (Tamsulosin Hcl) .... Take One Tablet  By Mouth Once Daily. 5)  Zyrtec Allergy 10 Mg Tabs (Cetirizine Hcl) 6)  Vitamin D 14782 Unit Caps (Ergocalciferol) .Marland Kitchen.. 1 Tab Every Other Wed 7)  Lipitor 40 Mg Tabs (Atorvastatin Calcium) .Marland Kitchen.. 1 Every Other Day 8)  Metoprolol Tartrate 50 Mg Tabs (Metoprolol Tartrate) .... 1/2 By Mouth Two Times A Day 9)  Co Q-10 30 Mg  Caps (Coenzyme Q10) 10)  Multivitamins   Tabs (Multiple Vitamin)  Allergies (verified): 1)  ! Darvocet  Past History:  Past medical, surgical, family and social histories (including risk factors) reviewed for relevance to current acute and chronic problems.  Past Medical History: Reviewed history from 11/24/2009 and no changes required. Current Problems:  CAD (ICD-414.00) HYPERLIPIDEMIA (ICD-272.4) HYPERTENSION (ICD-401.9) ATRIAL FIBRILLATION (ICD-427.31) UNS ADVRS EFF OTH RX MEDICINAL&BIOLOGICAL SBSTNC (ICD-995.29) HYPERGLYCEMIA, BORDERLINE (ICD-790.29 ? 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 Marjorie Smolder Dr. Cornelius Moras- Heart Surgeon  Past Surgical History: Reviewed history from 01/22/2009 and no changes required. 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 Family History of Alcoholism/Addiction Family History of CAD Male 1st degree relative  father died post op clot CABG Family History Hypertension Family History Lung cancer Family History of Cardiovascular disorder Family History Breast cancer 1st degree relative  sister      Social History: Reviewed  history from 11/24/2009 and no changes required. Retired  state of [employer]  Married Alcohol use-yes Never Smoked     Caffeine use/day:  1  Review of Systems  The patient denies anorexia, fever, weight loss, weight gain, vision loss, chest pain, syncope, dyspnea on exertion, hemoptysis, melena, hematochezia, severe indigestion/heartburn, hematuria, transient blindness, difficulty walking, enlarged lymph nodes, and angioedema.    Physical Exam  General:  Well-developed,well-nourished,in no acute distress; alert,appropriate and cooperative throughout examination Head:  normocephalic and atraumatic.   Eyes:  vision grossly intact, pupils equal, and pupils round.   Ears:  R ear normal, L ear normal, and no external deformities.   Nose:  no external deformity and no external erythema.  congested no face pain  Mouth:  pharynx pink and moist.   Neck:  No deformities, masses, or tenderness noted. Lungs:  Normal respiratory effort, chest expands symmetrically. Lungs are clear to auscultation, no crackles or wheezes.normal breath sounds.   Heart:  normal rate, regular rhythm, no gallop, no rub, and no lifts.   Pulses:  nl cap refill  Extremities:  trace left pedal edema and trace right pedal edema.   Skin:  turgor normal, color normal, no ecchymoses, and no petechiae.   Cervical Nodes:  shoddy nodes  Psych:  Oriented X3, good eye contact, not anxious appearing, and not depressed appearing.     Impression & Recommendations:  Problem # 1:  BLADDER NECK OBSTRUCTION (ICD-596.0) under urology care and evaluation  Problem # 2:  ALLERGIC RHINITIS (ICD-477.9)  His updated medication list for this problem includes:    Zyrtec Allergy 10 Mg Tabs (Cetirizine hcl) .Marland Kitchen... As needed  Problem # 3:  RHINITIS, CHRONIC (ICD-472.0) disc optiions and trial such as astelin as needed . at [facility] for secondary infection.    Problem # 4:  PALPITATIONS (ICD-785.1) under evaluation...sound benign  His updated  medication list for this problem includes:    Metoprolol Tartrate 50 Mg Tabs (Metoprolol tartrate) .Marland Kitchen... 1/2 by mouth two times a day  Problem # 5:  CAD (ICD-414.00)  His updated medication list for this problem includes:    Baby Aspirin 81 Mg Chew (Aspirin) .Marland Kitchen... 1 tab by mouth once daily    Metoprolol Tartrate 50 Mg Tabs (Metoprolol tartrate) .Marland Kitchen... 1/2 by mouth two times a day  Complete Medication List: 1)  Baby Aspirin 81 Mg Chew (Aspirin) .Marland Kitchen.. 1 tab by mouth once daily 2)  Aciphex 20 Mg Tbec (Rabeprazole sodium) .Marland Kitchen.. 1 by mouth once daily 3)  Singulair 10 Mg Tabs (Montelukast sodium) .Marland Kitchen.. 1 by mouth once daily 4)  Flomax 0.4 Mg Cp24 (Tamsulosin hcl) .... Take one tablet by mouth once daily. 5)  Zyrtec Allergy 10 Mg Tabs (Cetirizine hcl) .... As needed 6)  Vitamin D 63875 Unit Caps (Ergocalciferol) .Marland Kitchen.. 1 tab every other wed 7)  Lipitor 40 Mg Tabs (Atorvastatin calcium) .Marland Kitchen.. 1 every other day 8)  Metoprolol Tartrate 50 Mg Tabs (Metoprolol tartrate) .... 1/2 by mouth two times a day 9)  Co Q-10 30 Mg Caps (Coenzyme q10) .Marland Kitchen.. 1 tab by mouth once daily 10)  Multivitamins Tabs (Multiple  vitamin) .Marland Kitchen.. 1 tab by mouth once daily  Hypertension Assessment/Plan:      The patient's hypertensive risk group is category C: Target organ damage and/or diabetes.  Today's blood pressure is 110/70.  His blood pressure goal is < 140/90.  Patient Instructions: 1)  if increasing     signs  of infection go ahead and take  2 500 mg  amoxicillin   3 x per day for sinustis  for 7-10 days. 2)  COnsider adding   astelin antihistamine nose spray. 3)  follow up with Dr Eden Emms about the palpitation evaluation. 4)  Ask Urology about   poss overide of branded flomax.  Prescriptions: ASTELIN 137 MCG/SPRAY SOLN (AZELASTINE HCL) 2 spray two times a day for nasal draonage  #1 x 1   Entered and Authorized by:   Madelin Headings MD   Signed by:   Madelin Headings MD on 05/11/2010   Method used:   Print then Give to  Patient   RxID:   [id number]

## 2011-01-24 NOTE — Progress Notes (Signed)
Summary: refill on aciphex  Phone Note Call from Patient Call back at Home Phone [phone]   Caller: Patient Summary of Call: Pt also needs refills on aciphex Initial call taken by: Romualdo Bolk, CMA Duncan Dull),  December 27, 2009 4:57 PM  Follow-up for Phone Call        Rx sent electronically Follow-up by: Romualdo Bolk, CMA (AAMA),  December 27, 2009 4:58 PM    Prescriptions: ACIPHEX 20 MG  TBEC (RABEPRAZOLE SODIUM) 1 by mouth once daily  #90 x 3   Entered by:   Romualdo Bolk, CMA (AAMA)   Authorized by:   Madelin Headings MD   Signed by:   Romualdo Bolk, CMA (AAMA) on 12/27/2009   Method used:   [facility]        [facility] [phone]* (retail)       [address]       [place], [place]  [address]       Ph: [phone]       Fax: [phone]   RxID:   [id number]

## 2011-01-24 NOTE — Assessment & Plan Note (Signed)
Summary: flu shot//ccm/pt rsc/cjr  Nurse Visit   Allergies: 1)  ! Darvocet  Orders Added: 1)  Flu Vaccine 67yrs + MEDICARE PATIENTS [Q2039] 2)  Administration Flu vaccine - MCR [G0008] Flu Vaccine Consent Questions     Do you have a history of severe allergic reactions to this vaccine? no    Any prior history of allergic reactions to egg and/or gelatin? no    Do you have a sensitivity to the preservative Thimersol? no    Do you have a past history of Guillan-Barre Syndrome? no    Do you currently have an acute febrile illness? no    Have you ever had a severe reaction to latex? no    Vaccine information given and explained to patient? yes    Are you currently pregnant? no    Lot Number:[id number]   Exp Date:06/24/2011   Site Given  Left Deltoid IM 2)  Administration Flu vaccine - MCR [G0008] .lbmedflu

## 2011-01-24 NOTE — Progress Notes (Signed)
Summary: refill singulair  Phone Note Call from Patient   Caller: Patient Summary of Call: Pt needs a refill on singlair and [facility]oprolol[facility]  Initial call taken by: Romualdo Bolk, CMA Duncan Dull),  July 26, 2010 3:07 PM  Follow-up for Phone Call        Pt's wife aware that pt needs a vit d level and a rov before next refill. Rx sent to pharmacy Follow-up by: Romualdo Bolk, CMA Duncan Dull),  July 26, 2010 3:24 PM    Prescriptions: METOPROLOL TARTRATE 50 MG TABS (METOPROLOL TARTRATE) 1/2 by mouth two times a day  #90 x 0   Entered by:   Romualdo Bolk, CMA (AAMA)   Authorized by:   Madelin Headings MD   Signed by:   Romualdo Bolk, CMA (AAMA) on 07/26/2010   Method used:   [facility] to        [facility] [phone] (retail)       [address]       [place], [place]  [address]       Ph: [phone]       Fax: [phone]   RxID:   [id number] SINGULAIR 10 MG  TABS (MONTELUKAST SODIUM) 1 by mouth once daily  #30 x 0   Entered by:   Romualdo Bolk, CMA (AAMA)   Authorized by:   Madelin Headings MD   Signed by:   Romualdo Bolk, CMA (AAMA) on 07/26/2010   Method used:   [facility] to        [facility] [phone]* (retail)       [address]       [place], [place]  [address]       Ph: [phone]       Fax: [phone]   RxID:   [id number]

## 2011-01-24 NOTE — Assessment & Plan Note (Signed)
Summary: rov/palpitations/f/u 48hour monitor/dm   Primary Provider:  Madelin Headings MD  CC:  palpitations.  History of Present Illness: [name] is seen today post CABG in 2009.l  He has not had any SSCP palpitaitons or dyspnea.  He has leg cramps with most statins.  He is tolerating lipitor qod with an LDL of 82    He has prostate issues and flomax is working fairly well.  If his chills continue at night he will See Dr Isabel Caprice to have his prostate rechecked and get some BC's in our office.  He has had recurrent hernias in the past and is having recurrent issues on the right.  I gave hiim Dr Maris Berger name as a referral.  Otherwise he has been compliant with his meds and walks on a daily basis.  He had some palpitations and Event monitor was benign.    Current Problems (verified): 1)  Palpitations  (ICD-785.1) 2)  Carotid Artery Disease  (ICD-433.10) 3)  Vitamin D Deficiency  (ICD-268.9) 4)  Back Pain  (ICD-724.5) 5)  ? of Abscess, Tooth  (ICD-522.5) 6)  Bladder Neck Obstruction  (ICD-596.0) 7)  Cad  (ICD-414.00) 8)  Hyperlipidemia  (ICD-272.4) 9)  Hypertension  (ICD-401.9) 10)  Atrial Fibrillation  (ICD-427.31) 11)  Hernia  (ICD-553.9) 12)  Dizziness  (ICD-780.4) 13)  Ruq Pain  (ICD-789.01) 14)  Muscle Pain  (ICD-729.1) 15)  Dyspnea On Exertion  (ICD-786.09) 16)  Sinusitis- Acute-nos  (ICD-461.9) 17)  Uns Advrs Eff Oth Rx Medicinal&biological Sbstnc  (ICD-995.29) 18)  Hyperglycemia, Borderline  (ICD-790.29) 19)  Verruca Vulgaris  (ICD-078.10) 20)  ? of Patent Foramen Ovale  (ICD-745.5) 21)  Hx, Personal, Malignancy, Skin Nec  (ICD-V10.83) 22)  Lumbar Radiculopathy, Right  (ICD-724.4) 23)  Colonic Polyps, Hx of  (ICD-V12.72) 24)  Advef, Drug/medicinal/biological Subst Nos  (ICD-995.20) 25)  Echocardiogram, Abnormal  (ICD-793.2) 26)  Benign Prostatic Hypertrophy  (ICD-600.00) 27)  Gerd  (ICD-530.81) 28)  Allergic Rhinitis  (ICD-477.9)  Current Medications (verified): 1)  Baby  Aspirin 81 Mg  Chew (Aspirin) .Marland Kitchen.. 1 Tab By Mouth Once Daily 2)  Aciphex 20 Mg  Tbec (Rabeprazole Sodium) .Marland Kitchen.. 1 By Mouth Once Daily 3)  Singulair 10 Mg  Tabs (Montelukast Sodium) .Marland Kitchen.. 1 By Mouth Once Daily 4)  Flomax 0.4 Mg  Cp24 (Tamsulosin Hcl) .... Take One Tablet By Mouth Once Daily. 5)  Zyrtec Allergy 10 Mg Tabs (Cetirizine Hcl) .... As Needed 6)  Vitamin D 04540 Unit Caps (Ergocalciferol) .Marland Kitchen.. 1 Tab Every Other Wed 7)  Lipitor 40 Mg Tabs (Atorvastatin Calcium) .Marland Kitchen.. 1 Every Other Day 8)  Metoprolol Tartrate 50 Mg Tabs (Metoprolol Tartrate) .... 1/2 By Mouth Two Times A Day 9)  Co Q-10 30 Mg  Caps (Coenzyme Q10) .Marland Kitchen.. 1 Tab By Mouth Once Daily 10)  Multivitamins   Tabs (Multiple Vitamin) .Marland Kitchen.. 1 Tab By Mouth Once Daily  Allergies (verified): 1)  ! Darvocet  Past History:  Past Medical History: Last updated: 11/24/2009 Current Problems:  CAD (ICD-414.00) HYPERLIPIDEMIA (ICD-272.4) HYPERTENSION (ICD-401.9) ATRIAL FIBRILLATION (ICD-427.31) UNS ADVRS EFF OTH RX MEDICINAL&BIOLOGICAL SBSTNC (ICD-995.29) HYPERGLYCEMIA, BORDERLINE (ICD-790.29 ? 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 Marjorie Smolder Dr. Cornelius Moras- Heart Surgeon  Past Surgical History: Last updated: 01/22/2009 ls surgery fx pelvis basal cell and early melanoma removal  Inguinal herniorrhaphy Tonsillectomy  Hx of sinus polyps   CABG   2009     Family History: Last updated: 11/24/2009 Family History of Alcoholism/Addiction Family History of CAD Male 1st degree relative  father died post op clot CABG Family History Hypertension Family History Lung cancer Family History of  Cardiovascular disorder Family History Breast cancer 1st degree relative  sister      Social History: Last updated: 11/24/2009 Retired  state of [place]  Married Alcohol use-yes Never Smoked      Review of Systems       Denies fever, malais, weight loss, blurry vision, decreased visual acuity, cough, sputum, SOB, hemoptysis, pleuritic pain heartburn, abdominal pain, melena, lower extremity edema, claudication, or rash.   Vital Signs:  Patient profile:   70 year old male Height:      69 inches Weight:      207 pounds BMI:     30.68 Pulse rate:   77 / minute Resp:     14 per minute BP sitting:   124 / 74  (left arm)  Vitals Entered By: Kem Parkinson (May 18, 2010 10:07 AM)  Physical Exam  General:  Affect appropriate Healthy:  appears stated age HEENT: normal Neck supple with no adenopathy JVP normal no bruits no thyromegaly Lungs clear with no wheezing and good diaphragmatic motion Heart:  S1/S2 no murmur,rub, gallop or click PMI normal Abdomen: benighn, BS positve, no tenderness, no AAA no bruit.  No HSM or HJR Distal pulses intact with no bruits No edema Neuro non-focal Skin warm and dry    Impression & Recommendations:  Problem # 1:  PALPITATIONS (ICD-785.1) Benign no significant findings on monitor.  Continue BB His updated medication list for this problem includes:    Baby Aspirin 81 Mg Chew (Aspirin) .Marland Kitchen... 1 tab by mouth once daily    Metoprolol Tartrate 50 Mg Tabs (Metoprolol tartrate) .Marland Kitchen... 1/2 by mouth two times a day  Problem # 2:  CAROTID ARTERY DISEASE (ICD-433.10) F/U duplex in a year His updated medication list for this problem includes:    Baby Aspirin 81 Mg Chew (Aspirin) .Marland Kitchen... 1 tab by mouth once daily  Problem # 3:  CAD (ICD-414.00) CABG with no angina and non-ischemic myovue Continue medical Rx His updated medication list for this problem includes:    Baby Aspirin 81 Mg Chew (Aspirin) .Marland Kitchen... 1 tab by mouth once daily    Metoprolol  Tartrate 50 Mg Tabs (Metoprolol tartrate) .Marland Kitchen... 1/2 by mouth two times a day  Problem # 4:  HYPERLIPIDEMIA (ICD-272.4) Contineu lipitor every other day as he seems to tolerate this His updated medication list for this problem includes:    Lipitor 40 Mg Tabs (Atorvastatin calcium) .Marland Kitchen... 1 every other day  Patient Instructions: 1)  Your physician recommends that you schedule a follow-up appointment in: 6 months with Dr Eden Emms 2)  Your physician recommends that you continue on your current medications as directed. Please refer to the Current Medication list given to you today.

## 2011-01-24 NOTE — Progress Notes (Signed)
Summary: Pt having palp  Phone Note Call from Patient Call back at Home Phone [phone]   Caller: Patient Summary of Call: Pt having palp for the last two weeks Initial call taken by: Judie Grieve,  May 09, 2010 2:13 PM  Follow-up for Phone Call        Left message to call back Deliah Goody, RN  May 09, 2010 3:08 PM  spoke with pt, he has been having consistant palpitations now for the last 3 weeks. he states he usually has these palpitations are usually associated with the drinking of caffine but more recently they come on at anytime. will check into the pt getting a 48 hoiur monitor to see what is going on. pt scheduled for monitor tomorrow, follow up appt made for pt to see dr Eden Emms next week Deliah Goody, RN  May 09, 2010 4:03 PM   New Problems: PALPITATIONS (ICD-785.1)   New Problems: PALPITATIONS (ICD-785.1)

## 2011-01-24 NOTE — Letter (Signed)
Summary: Generic Letter  [place] [facility]  [address] [place], [place] [address]   Phone: [phone]  Fax: [phone]    11/09/2010  [name] [address] [place], [place]  [address]  Dear Mr. TITSWORTH,  (1) BMP (METABOL)   Sodium                    138 mEq/L                   135-145   Potassium                 4.2 mEq/L                   3.5-5.1   Chloride                  106 mEq/L                   96-112   Carbon Dioxide            27 mEq/L                    19-32   Glucose              [H]  106 mg/dL                   57-84   BUN                       15 mg/dL                    6-96   Creatinine                0.8 mg/dL                   2.9-5.2   Calcium                   8.7 mg/dL                   8.4-13.2   GFR                       101.27 mL/min               >60  Tests: (2) CBC Platelet w/Diff (CBCD)   White Cell Count          6.9 K/uL                    4.5-10.5   Red Cell Count            4.66 Mil/uL                 4.22-5.81   Hemoglobin                15.4 g/dL                   44.0-10.2   Hematocrit                45.2 %                      39.0-52.0   MCV  96.9 fl                     78.0-100.0   MCHC                      34.1 g/dL                   95.6-21.3   RDW                       13.6 %                      11.5-14.6   Platelet Count            205.0 K/uL                  150.0-400.0   Neutrophil %              52.0 %                      43.0-77.0   Lymphocyte %              27.3 %                      12.0-46.0   Monocyte %           [H]  12.2 %                      3.0-12.0   Eosinophils%         [H]  7.6 %                       0.0-5.0   Basophils %               0.9 %                       0.0-3.0   Neutrophill Absolute      3.6 K/uL                    1.4-7.7   Lymphocyte Absolute       1.9 K/uL                    0.7-4.0   Monocyte Absolute         0.8 K/uL                    0.1-1.0  Eosinophils, Absolute                             0.5 K/uL                    0.0-0.7   Basophils Absolute        0.1 K/uL                    0.0-0.1  Tests: (3) TSH (TSH)   FastTSH                   1.07 uIU/mL                 0.35-5.50   Your labs are okay. Your blood sugar is borderline and your thyroid is normal. Please  call to schedule a wellness visit in 6 months.       Sincerely,   Tor Netters, CMA (AAMA)

## 2011-01-24 NOTE — Assessment & Plan Note (Signed)
Summary: lower back pain/ssc   Vital Signs:  Patient profile:   70 year old male Weight:      204 pounds Temp:     98.4 degrees F oral Pulse rate:   72 / minute BP sitting:   130 / 80  (right arm) Cuff size:   regular  Vitals Entered By: Romualdo Bolk, CMA (AAMA) (October 25, 2010 1:15 PM) CC: Lower Back spasms with stinging after urination, no urinary frequency. This has been going on for 2 weeks with back pain. Back pain comes and goes.   History of Present Illness: Patient comes in today  for  urgent work in for acute LBP . CO of  shooting pain arouhd lower back and no  discrete radiation but  legs  feel heavy with this. worse    with sit and bend  and  walking may help a bit.   hx of herniated disc  in the past   years ago.    This feels different  hx of neck djd but no change in this  Cant take   narcotic s for se issues .  Leaving town   Nov 18th to go to [place] .   No  hematuria  some dysuria . No retention.  hx of  Gu  signs   CV no change   Anticoagulation Management History:      Positive risk factors for bleeding include an age of 44 years or older.  The bleeding index is 'intermediate risk'.  Positive CHADS2 values include History of HTN.  Negative CHADS2 values include Age > 28 years old.  His last INR was 1.0 RATIO.    Preventive Screening-Counseling & Management  Alcohol-Tobacco     Alcohol drinks/day: <1     Alcohol type: beer     Smoking Status: never  Caffeine-Diet-Exercise     Caffeine use/day: 1     Does Patient Exercise: no  Current Medications (verified): 1)  Baby Aspirin 81 Mg  Chew (Aspirin) .Marland Kitchen.. 1 Tab By Mouth Once Daily 2)  Aciphex 20 Mg  Tbec (Rabeprazole Sodium) .Marland Kitchen.. 1 By Mouth Once Daily 3)  Singulair 10 Mg  Tabs (Montelukast Sodium) .Marland Kitchen.. 1 By Mouth Once Daily 4)  Flomax 0.4 Mg  Cp24 (Tamsulosin Hcl) .... Take One Tablet By Mouth Once Daily. 5)  Zyrtec Allergy 10 Mg Tabs (Cetirizine Hcl) .... As Needed 6)  Vitamin D 40347 Unit Caps  (Ergocalciferol) .Marland Kitchen.. 1 Tab Every Other Wed 7)  Lipitor 40 Mg Tabs (Atorvastatin Calcium) .Marland Kitchen.. 1 Every Other Day 8)  Metoprolol Tartrate 50 Mg Tabs (Metoprolol Tartrate) .... 1/2 By Mouth Two Times A Day 9)  Co Q-10 30 Mg  Caps (Coenzyme Q10) .Marland Kitchen.. 1 Tab By Mouth Once Daily 10)  Multivitamins   Tabs (Multiple Vitamin) .Marland Kitchen.. 1 Tab By Mouth Once Daily 11)  Clobetasol Propionate 0.05 % Crea (Clobetasol Propionate) .... Apply To Back Two Times A Day  Allergies (verified): 1)  ! Darvocet  Past History:  Past medical, surgical, family and social histories (including risk factors) reviewed, and no changes noted (except as noted below).  Past Medical History: Reviewed history from 11/24/2009 and no changes required. Current Problems:  CAD (ICD-414.00) HYPERLIPIDEMIA (ICD-272.4) HYPERTENSION (ICD-401.9) ATRIAL FIBRILLATION (ICD-427.31) UNS ADVRS EFF OTH RX MEDICINAL&BIOLOGICAL SBSTNC (ICD-995.29) HYPERGLYCEMIA, BORDERLINE (ICD-790.29 ? 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 Marjorie Smolder Dr. Cornelius Moras- Heart Surgeon  Past Surgical History: Reviewed history from 01/22/2009 and no changes required. 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 Jenne Pane Dermatology: Dr. Margo Aye [facility] :  [facility]  Cardiology: Eden Emms Dermatology: Yetta Barre  Family History: Reviewed history from 11/24/2009 and no changes required. Family History of Alcoholism/Addiction Family History of CAD Male 1st degree relative  father died post op clot  CABG Family History Hypertension Family History Lung cancer Family History of Cardiovascular disorder Family History Breast cancer 1st degree relative  sister      Social History: Reviewed history from 11/24/2009 and no changes required. Retired  state of [employer]  Married Alcohol use-yes Never Smoked      Review of Systems  The patient denies anorexia, fever, weight loss, weight gain, vision loss, chest pain, syncope, dyspnea on exertion, prolonged cough, melena, hematochezia, severe indigestion/heartburn, hematuria, incontinence, transient blindness, enlarged lymph nodes, and angioedema.         had rash  upper back and was given cream for this   and some better   mid left back with area wf burning sensistiviy without a rash.   Physical Exam  General:  Well-developed,well-nourished,in no mild distress; alert,appropriate and cooperative throughout examination Head:  Normocephalic and atraumatic without obvious abnormalities. No apparent alopecia or balding. Eyes:  vision grossly intact.   Nose:  no external deformity and no external erythema.   Neck:  non tender localized  Lungs:  Normal respiratory effort, chest expands symmetrically. Lungs are clear to auscultation, no crackles or wheezes.no dullness.   Heart:  Normal rate and regular rhythm. S1 and S2 normal without gallop, murmur, click, rub or other extra sounds. Abdomen:  Bowel sounds positive,abdomen soft and non-tender without masses, organomegaly or  noted. Pulses:  pulses intact without delay   Extremities:  no clubbing cyanosis or edema  Neurologic:  gait antalgic and leans forward    no obvious focal weakness   states has numbness in right foot from pervi disc issue    alert & oriented X3.   Skin:  no ecchymoses and no petechiae.   Cervical Nodes:  No lymphadenopathy noted Psych:  Oriented X3, normally interactive, good eye contact, not anxious appearing, and not depressed appearing.     Impression &  Recommendations:  Problem # 1:  BACK PAIN, LUMBAR (ICD-724.2)  new onset   with leg .sx  acute on chronic  suspect mechanical  get x ray   and then refer .  Hc of djd  .  no ob weakness today   His updated medication list for this problem includes:    Baby Aspirin 81 Mg Chew (Aspirin) .Marland Kitchen... 1 tab by mouth once daily    Cyclobenzaprine Hcl 10 Mg Tabs (Cyclobenzaprine hcl) .Marland Kitchen... 1 by mouth three times a day as needed muscle relaxant  Orders: UA Dipstick w/o Micro (automated)  (81003) T-Lumbar Spine Complete, 5 Views (16109UE) Orthopedic Referral (Ortho)  Problem # 2:  NEURITIS (ICD-729.2)  left flank ? what to make of this .     seems not related to above.  wife worried about some type of referred lung issues  Orders: Orthopedic Referral (Ortho)  Problem # 3:  COUGH (ICD-786.2)  with chronic  sinusitis   check  cxray r./o pathology .     Orders: T-2 View CXR (71020TC)  Problem # 4:  CAD (ICD-414.00) stable  His updated medication list for this problem includes:    Baby Aspirin 81 Mg Chew (Aspirin) .Marland Kitchen... 1 tab by mouth once daily    Metoprolol Tartrate 50 Mg Tabs (Metoprolol tartrate) .Marland Kitchen... 1/2 by mouth two times a day  Complete Medication List: 1)  Baby Aspirin 81 Mg Chew (Aspirin) .Marland Kitchen.. 1 tab by mouth once daily 2)  Aciphex 20 Mg Tbec (Rabeprazole sodium) .Marland Kitchen.. 1 by mouth once daily 3)  Singulair 10 Mg Tabs (Montelukast sodium) .Marland Kitchen.. 1 by mouth once daily 4)  Flomax 0.4 Mg Cp24 (Tamsulosin hcl) .... Take one tablet by mouth once daily. 5)  Zyrtec Allergy 10 Mg Tabs (Cetirizine hcl) .... As needed 6)  Vitamin D 57846 Unit Caps (Ergocalciferol) .Marland Kitchen.. 1 tab every other wed 7)  Lipitor 40 Mg Tabs (Atorvastatin calcium) .Marland Kitchen.. 1 every other day 8)  Metoprolol Tartrate 50 Mg Tabs (Metoprolol tartrate) .... 1/2 by mouth two times a day 9)  Co Q-10 30 Mg Caps (Coenzyme q10) .Marland Kitchen.. 1 tab by mouth once daily 10)  Multivitamins Tabs (Multiple vitamin) .Marland Kitchen.. 1 tab by mouth once daily 11)   Clobetasol Propionate 0.05 % Crea (Clobetasol propionate) .... Apply to back two times a day 12)  Cyclobenzaprine Hcl 10 Mg Tabs (Cyclobenzaprine hcl) .Marland Kitchen.. 1 by mouth three times a day as needed muscle relaxant  Anticoagulation Management Assessment/Plan:             Patient Instructions: 1)  get x ray today and will call about results .   2)  can do referral  to ortho . 3)  This still acts like mechanical back pain .  4)  try muscle relaxant and tylenol  or  care with Consepcion Hearing   with care  Prescriptions: CYCLOBENZAPRINE HCL 10 MG TABS (CYCLOBENZAPRINE HCL) 1 by mouth three times a day as needed muscle relaxant  #30 x 0   Entered and Authorized by:   Madelin Headings MD   Signed by:   Madelin Headings MD on 10/25/2010   Method used:   Electronically to        [facility] [phone]* (retail)       [address]       [place], [place]  [address]       Ph: [phone]       Fax: [phone]   RxID:   [id number]    Orders Added: 1)  UA Dipstick w/o Micro (automated)  [81003] 2)  T-2 View CXR [71020TC] 3)  T-Lumbar Spine Complete, 5 Views [71110TC] 4)  Est. Patient Level IV [51884] 5)  Orthopedic Referral [Ortho]    Laboratory Results   Urine Tests    Routine Urinalysis   Color: yellow Appearance: Clear Glucose: negative   (Normal Range: Negative) Bilirubin: 1+   (Normal Range: Negative) Ketone: trace (5)   (Normal Range: Negative) Spec. Gravity: 1.025   (Normal Range: 1.003-1.035) Blood: negative   (Normal Range: Negative) pH: 5.0   (Normal Range: 5.0-8.0) Protein: trace   (Normal Range: Negative) Urobilinogen: 0.2   (Normal Range: 0-1) Nitrite: negative   (Normal Range: Negative) Leukocyte Esterace: negative   (Normal Range: Negative)    Comments: Rita Ohara  October 25, 2010 1:27 PM

## 2011-01-24 NOTE — Progress Notes (Signed)
Summary: lower back pain and trouble walking  Phone Note Call from Patient Call back at Home Phone [phone]   Caller: Wife Summary of Call: Spoke to wife- Pt is having a hard time walking due to pain. Pt seen by a derm and given that has been using for x 1 month. Pt is having lower back pain. The whole top part of his body is leaning forward. This has been going on since 10/31. They want to be worked in this afternoon.  Initial call taken by: Romualdo Bolk, CMA (AAMA),  October 25, 2010 9:06 AM  Follow-up for Phone Call        Per Dr. Fabian Sharp- work in the afternoon. Pt's wife aware and appt made. Follow-up by: Romualdo Bolk, CMA (AAMA),  October 25, 2010 11:52 AM

## 2011-01-24 NOTE — Progress Notes (Signed)
Summary: Pt is having catarac surgery on Wed March 30th. No pre-op req  Phone Note Call from Patient Call back at Home Phone [phone]   Caller: spouse-Eileen Summary of Call: Pts wife called and pt is going to be having catarac surgery on Wednesday 03/23/10. There is no pre-op appt req per Dr. Rogers [facility], so pt has cx his pre-op appt on April 20th 2011. Initial call taken by: Lucy Antigua,  March 14, 2010 3:10 PM

## 2011-01-24 NOTE — Procedures (Signed)
Summary: summary report  summary report   Imported By: Mirna Mires 05/24/2010 11:29:37  _____________________________________________________________________  External Attachment:    Type:   Image     Comment:   External Document

## 2011-01-24 NOTE — Assessment & Plan Note (Signed)
Summary: per check out/sf   Visit Type:  Follow-up Primary Provider:  Madelin Headings MD  CC:  no complaints.  History of Present Illness: [name] is seen today post CABG in 2009.l  He has not had any SSCP palpitaitons or dyspnea.  He has leg cramps with most statins.  We will try to have him take lipitor every other day and check his lipids in 3 months.  He has had some chills at night but has no signs of occult infection or SBE.  He has prostate issues and flomax is working fairly well.  If his chills continue at night he will See Dr Isabel Caprice to have his prostate rechecked and get some BC's in our office.  He has had recurrent hernias in the past and is having recurrent issues on the right.  I gave hiim Dr Maris Berger name as a referral.  Otherwise he has been compliant with his meds and walks on a daily basis.    Current Problems (verified): 1)  Vitamin D Deficiency  (ICD-268.9) 2)  Back Pain  (ICD-724.5) 3)  ? 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 of Patent Foramen Ovale  (ICD-745.5) 19)  Hx, Personal, Malignancy, Skin Nec  (ICD-V10.83) 20)  Lumbar Radiculopathy, Right  (ICD-724.4) 21)  Colonic Polyps, Hx of  (ICD-V12.72) 22)  Advef, Drug/medicinal/biological Subst Nos  (ICD-995.20) 23)  Echocardiogram, Abnormal  (ICD-793.2) 24)  Benign Prostatic Hypertrophy  (ICD-600.00) 25)  Gerd  (ICD-530.81) 26)  Allergic Rhinitis  (ICD-477.9)  Current Medications (verified): 1)  Baby Aspirin 81 Mg   Chew (Aspirin) 2)  Aciphex 20 Mg  Tbec (Rabeprazole Sodium) .Marland Kitchen.. 1 By Mouth Once Daily 3)  Singulair 10 Mg  Tabs (Montelukast Sodium) .Marland Kitchen.. 1 By Mouth Once Daily 4)  Flomax 0.4 Mg  Cp24 (Tamsulosin Hcl) .... Take One Tablet By Mouth Once Daily. 5)  Zyrtec Allergy 10 Mg Tabs (Cetirizine Hcl) 6)  Vitamin D 60454 Unit Caps (Ergocalciferol) .Marland Kitchen.. 1 Tab Every Other Wed 7)  Lipitor 40 Mg Tabs (Atorvastatin Calcium) .Marland Kitchen.. 1 Every Other Day 8)  Metoprolol Tartrate 50 Mg Tabs (Metoprolol Tartrate) .... 1/2 By Mouth Two Times A Day 9)  Co Q-10 30 Mg  Caps (Coenzyme Q10)  Allergies (verified): 1)  ! Darvocet  Past History:  Past Medical History: Last updated: 11/24/2009 Current Problems:  CAD (ICD-414.00) HYPERLIPIDEMIA (ICD-272.4) HYPERTENSION (ICD-401.9) ATRIAL FIBRILLATION (ICD-427.31) UNS ADVRS EFF OTH RX MEDICINAL&BIOLOGICAL SBSTNC (ICD-995.29) HYPERGLYCEMIA, BORDERLINE (ICD-790.29 ? 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 Marjorie Smolder Dr. Cornelius Moras- Heart Surgeon  Past Surgical History: Last updated: 01/22/2009 ls surgery fx pelvis basal cell and early melanoma removal Inguinal herniorrhaphy Tonsillectomy  Hx of sinus polyps   CABG   2009     Family History: Last updated: 11/24/2009 Family History of  Alcoholism/Addiction Family History of CAD Male 1st degree relative  father died post op clot CABG Family History Hypertension Family History Lung cancer Family History of Cardiovascular disorder Family History Breast cancer 1st degree relative  sister      Social History: Last updated: 11/24/2009 Retired  state of [employer]  Married Alcohol  use-yes Never Smoked      Review of Systems       Denies fever, malais, weight loss, blurry vision positive with need for cataract surgery, decreased visual acuity, cough, sputum, SOB, hemoptysis, pleuritic pain, palpitaitons, heartburn, abdominal pain, melena, lower extremity edema, claudication, or rash.  All other systems reviewed and negative except as noted in HPI  Vital Signs:  Patient profile:   70 year old male Height:      69 inches Weight:      207 pounds BMI:     30.68 Pulse rate:   90 / minute BP sitting:   116 / 74  (left arm) Cuff size:   regular  Vitals Entered By: Burnett Kanaris, CNA (December 29, 2009 8:53 AM)  Physical Exam  General:  Affect appropriate Healthy:  appears stated age HEENT: normal Neck supple with no adenopathy JVP normal no bruits no thyromegaly Lungs clear with no wheezing and good diaphragmatic motion Heart:  S1/S2 no murmur,rub, gallop or click PMI normal Abdomen: benighn, BS positve, no tenderness, no AAA no bruit.  No HSM or HJR Distal pulses intact with no bruits No edema Neuro non-focal Skin warm and dry    Impression & Recommendations:  Problem # 1:  CAD (ICD-414.00) Nl LV CABG 2009 continue ASA and BB His updated medication list for this problem includes:    Baby Aspirin 81 Mg Chew (Aspirin)    Metoprolol Tartrate 50 Mg Tabs (Metoprolol tartrate) .Marland Kitchen... 1/2 by mouth two times a day  Problem # 2:  HYPERLIPIDEMIA (ICD-272.4) every other day on statin and recheck in 3 months.  Continue CoEnQ His updated medication list for this problem includes:    Lipitor 40 Mg Tabs (Atorvastatin calcium) .Marland Kitchen... 1 every other day  Problem # 3:  BLADDER NECK OBSTRUCTION (ICD-596.0) F/U Grapey continue flomax  Problem # 4:  HYPERTENSION (ICD-401.9) Well cotnrolled continue low sodium diet His updated medication list for this problem includes:    Baby Aspirin 81 Mg Chew (Aspirin)    Metoprolol Tartrate 50 Mg Tabs (Metoprolol tartrate) .Marland Kitchen...  1/2 by mouth two times a day  Problem # 5:  HERNIA (ICD-553.9) Refer to Dr Abbey Chatters.  Limit weight lifting to 20 lbs.  Patient Instructions: 1)  Your physician recommends that you schedule a follow-up appointment in: 6 months with dr Eden Emms 2)  Your physician recommends that you continue on your current medications as directed. Please refer to the Current Medication list given to you today.take lipitor  evry other day  3)  Your physician recommends that you return for lab work in: return for fasting labs in 6-8 weeks lipid liver

## 2011-01-24 NOTE — Assessment & Plan Note (Signed)
Summary: fup labs//ccm   Vital Signs:  Patient profile:   70 year old male Weight:      204 pounds Pulse rate:   78 / minute BP sitting:   120 / 80  (right arm) Cuff size:   regular  Vitals Entered By: Romualdo Bolk, CMA Duncan Dull) (August 10, 2010 2:27 PM) CC: Follow-up visit on labs, Hypertension Management   History of Present Illness: [name] comes in today  with wife for follow up of labs and multiple medical problems . Since last visit  here  there have been no major changes in health status  . HT  no se of  meds seen  VIt d  her for follow up  CAD :   NO new cp sob. Neck  : no change    Hypertension History:      He denies headache, chest pain, palpitations, dyspnea with exertion, orthopnea, PND, peripheral edema, visual symptoms, neurologic problems, syncope, and side effects from treatment.  He notes no problems with any antihypertensive medication side effects.        Positive major cardiovascular risk factors include male age 72 years old or older, hyperlipidemia, and hypertension.  Negative major cardiovascular risk factors include non-tobacco-user status.        Positive history for target organ damage include ASHD (either angina/prior MI/prior CABG).  Further assessment for target organ damage reveals no history of cardiac end-organ damage (CHF/LVH) or renal insufficiency.     Preventive Screening-Counseling & Management  Alcohol-Tobacco     Alcohol drinks/day: <1     Alcohol type: beer     Smoking Status: never  Caffeine-Diet-Exercise     Caffeine use/day: 1     Does Patient Exercise: no  Current Medications (verified): 1)  Baby Aspirin 81 Mg  Chew (Aspirin) .Marland Kitchen.. 1 Tab By Mouth Once Daily 2)  Aciphex 20 Mg  Tbec (Rabeprazole Sodium) .Marland Kitchen.. 1 By Mouth Once Daily 3)  Singulair 10 Mg  Tabs (Montelukast Sodium) .Marland Kitchen.. 1 By Mouth Once Daily 4)  Flomax 0.4 Mg  Cp24 (Tamsulosin Hcl) .... Take One Tablet By Mouth Once Daily. 5)  Zyrtec Allergy 10 Mg Tabs  (Cetirizine Hcl) .... As Needed 6)  Vitamin D 98119 Unit Caps (Ergocalciferol) .Marland Kitchen.. 1 Tab Every Other Wed 7)  Lipitor 40 Mg Tabs (Atorvastatin Calcium) .Marland Kitchen.. 1 Every Other Day 8)  Metoprolol Tartrate 50 Mg Tabs (Metoprolol Tartrate) .... 1/2 By Mouth Two Times A Day 9)  Co Q-10 30 Mg  Caps (Coenzyme Q10) .Marland Kitchen.. 1 Tab By Mouth Once Daily 10)  Multivitamins   Tabs (Multiple Vitamin) .Marland Kitchen.. 1 Tab By Mouth Once Daily  Allergies (verified): 1)  ! Darvocet  Past History:  Past medical, surgical, family and social histories (including risk factors) reviewed for relevance to current acute and chronic problems.  Past Medical History: Reviewed history from 11/24/2009 and no changes required. Current Problems:  CAD (ICD-414.00) HYPERLIPIDEMIA (ICD-272.4) HYPERTENSION (ICD-401.9) ATRIAL FIBRILLATION (ICD-427.31) UNS ADVRS EFF OTH RX MEDICINAL&BIOLOGICAL SBSTNC (ICD-995.29) HYPERGLYCEMIA, BORDERLINE (ICD-790.29 ? 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 Marjorie Smolder Dr. Cornelius Moras- Heart Surgeon  Past Surgical History: Reviewed history from 01/22/2009 and no changes required. 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 Jenne Pane Dermatology: Dr. Margo Aye [facility]rology :  [facility]  Cardiology: Eden Emms PMH-FH-SH reviewed for relevance  Family History: Reviewed history from 11/24/2009 and no changes required. Family History of Alcoholism/Addiction Family History of CAD Male 1st degree relative  father died post op clot CABG Family History  Hypertension Family History Lung cancer Family History of Cardiovascular disorder Family History Breast cancer 1st degree relative  sister      Social History: Reviewed history from 11/24/2009 and no changes required. Retired  state of [employer]  Married Alcohol use-yes Never Smoked      Review of Systems  The patient denies dyspnea on exertion and peripheral edema.         chronic rhinitis , chronic  neck pain  Physical Exam  General:  Well-developed,well-nourished,in no acute distress; alert,appropriate and cooperative throughout examination Head:  normocephalic and atraumatic.   Eyes:  vision grossly intact.   Neck:  no masses.   Lungs:  Normal respiratory effort, chest expands symmetrically. Lungs are clear to auscultation, no crackles or wheezes.normal breath sounds.   Heart:  normal rate, regular rhythm, no gallop, no rub, and no lifts.   Abdomen:  Bowel sounds positive,abdomen soft and non-tender without masses, organomegaly or  noted. Pulses:  nl cap refill  Neurologic:  alert & oriented X3 and gait normal.   Skin:  turgor normal and color normal.   Cervical Nodes:  No lymphadenopathy noted Psych:  Oriented X3, good eye contact, not anxious appearing, and not depressed appearing.   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 1 tab by mouth once daily  Problem # 3:  HYPERTENSION (ICD-401.9)  His updated medication list for this problem includes:    Metoprolol Tartrate 50 Mg Tabs (Metoprolol tartrate) .Marland Kitchen... 1/2 by mouth two times a day  BP today: 120/80 Prior BP: 124/74 (05/18/2010)  Prior 10 Yr Risk Heart Disease: N/A (11/02/2009)  Labs Reviewed: K+: 4.1 (10/26/2009) Creat: : 0.7 (10/26/2009)   Chol:  141 (02/08/2010)   HDL: 43.20 (02/08/2010)   LDL: 81 (02/08/2010)   TG: 84.0 (02/08/2010)  Problem # 4:  HYPERLIPIDEMIA (ICD-272.4)  His updated medication list for this problem includes:    Lipitor 40 Mg Tabs (Atorvastatin calcium) .Marland Kitchen... 1 every other day  Labs Reviewed: SGOT: 18 (02/08/2010)   SGPT: 28 (02/08/2010)  Prior 10 Yr Risk Heart Disease: N/A (11/02/2009)   HDL:43.20 (02/08/2010), 39.20 (10/26/2009)  LDL:81 (02/08/2010), 79 (10/26/2009)  Chol:141 (02/08/2010), 128 (10/26/2009)  Trig:84.0 (02/08/2010), 50.0 (10/26/2009)  Complete Medication List: 1)  Baby Aspirin 81 Mg Chew (Aspirin) .Marland Kitchen.. 1 tab by mouth once daily 2)  Aciphex 20 Mg Tbec (Rabeprazole sodium) .Marland Kitchen.. 1 by mouth once daily 3)  Singulair 10 Mg Tabs (Montelukast sodium) .Marland Kitchen.. 1 by mouth once daily 4)  Flomax 0.4 Mg Cp24 (Tamsulosin hcl) .... Take one tablet by mouth once daily. 5)  Zyrtec Allergy 10 Mg Tabs (Cetirizine hcl) .... As needed 6)  Vitamin D 16109 Unit Caps (Ergocalciferol) .Marland Kitchen.. 1 tab every other wed 7)  Lipitor 40 Mg Tabs (Atorvastatin calcium) .Marland Kitchen.. 1 every other day 8)  Metoprolol Tartrate 50 Mg Tabs (Metoprolol tartrate) .... 1/2 by mouth two  times a day 9)  Co Q-10 30 Mg Caps (Coenzyme q10) .Marland Kitchen.. 1 tab by mouth once daily 10)  Multivitamins Tabs (Multiple vitamin) .Marland Kitchen.. 1 tab by mouth once daily  Hypertension Assessment/Plan:      The patient's hypertensive risk group is category C: Target organ damage and/or diabetes.  Today's blood pressure is 120/80.  His blood pressure goal is < 140/90.  Patient Instructions: 1)  bmp,  tsh ,  cbcdiff in early november  .  2)  dx  CAD, HT , DJD,   High risk   meds   3)  follow up depending  on results . 4)  ok to finish out the vitamin d and then stop.

## 2011-01-24 NOTE — Progress Notes (Signed)
Summary: [facility] [facility] [place]  Phone Note Refill Request Call back at [phone] [facility] from:  spouse Marjean Donna on June 29, 2010 2:09 PM  Refills Requested: Medication #1:  SINGULAIR 10 MG  TABS 1 by mouth once daily Pt out of town in [facility], [place] and is needing a refill of Singulair 10mg  [facility] [place] [place]  [phone]   Method Requested: Telephone to Pharmacy Initial call taken by: Lucy Antigua,  June 29, 2010 2:11 PM  Follow-up for Phone Call        Rx called to pharmacy Follow-up by: Raechel Ache, RN,  June 29, 2010 2:57 PM    Prescriptions: SINGULAIR 10 MG  TABS (MONTELUKAST SODIUM) 1 by mouth once daily  #30 x 0   Entered by:   Raechel Ache, RN   Authorized by:   Madelin Headings MD   Signed by:   Raechel Ache, RN on 06/29/2010   Method used:   Historical   RxID:   [id number]

## 2011-01-24 NOTE — Letter (Signed)
Summary: Colonoscopy-Changed to Office Visit Letter  [facility]  [address] [place], [place] [address]   Phone: [phone]  Fax: [phone]      July 13, 2010 MRN: [id number]   [name] [address] [place], [place]  [address]   Dear Mr. TOSO,   According to our records, it is time for you to schedule a Colonoscopy. However, after reviewing your medical record, I feel that an office visit would be most appropriate to more completely evaluate you and determine your need for a repeat procedure.  Please call [phone] (option #2) at your convenience to schedule an office visit. If you have any questions, concerns, or feel that this letter is in error, we would appreciate your call.   Sincerely,    Iva Boop, M.D.  [facility] [phone]

## 2011-01-24 NOTE — Progress Notes (Signed)
Summary: REFILL REQUEST  Phone Note Refill Request   Refills Requested: Medication #1:  ACIPHEX 20 MG  TBEC 1 by mouth once daily   [facility]...Marland KitchenMarland Kitchen90-day supply.  Medication #2:  SINGULAIR 10 MG  TABS 1 by mouth once daily   [facility]...Marland KitchenMarland Kitchen90-day supply.    Initial call taken by: Debbra Riding,  August 26, 2010 10:34 AM  Follow-up for Phone Call        Rx sent to pharmacy and left message on pharmacy's voicemail to fill the singulair as the 90 days and not the 30 days. Follow-up by: Romualdo Bolk, CMA (AAMA),  August 26, 2010 10:37 AM    Prescriptions: SINGULAIR 10 MG  TABS (MONTELUKAST SODIUM) 1 by mouth once daily  #90 x 0   Entered by:   Romualdo Bolk, CMA (AAMA)   Authorized by:   Madelin Headings MD   Signed by:   Romualdo Bolk, CMA (AAMA) on 08/26/2010   Method used:   Electronically to        [facility] [phone]* (retail)       [address]       [place], [place]  [address]       Ph: [phone]       Fax: [phone]   RxID:   [id number] SINGULAIR 10 MG  TABS (MONTELUKAST SODIUM) 1 by mouth once daily  #30 x 2   Entered by:   Romualdo Bolk, CMA (AAMA)   Authorized by:   Madelin Headings MD   Signed by:   Romualdo Bolk, CMA (AAMA) on 08/26/2010   Method used:   Electronically to        [facility] [phone]* (retail)       [address]       [place], [place]  [address]       Ph: [phone]       Fax: [phone]   RxID:   [id number] ACIPHEX 20 MG  TBEC (RABEPRAZOLE SODIUM) 1 by mouth once daily  #90 x 0   Entered by:   Romualdo Bolk, CMA (AAMA)   Authorized by:   Madelin Headings MD   Signed by:   Romualdo Bolk, CMA (AAMA) on 08/26/2010   Method used:   Electronically to        [facility] [phone]* (retail)       [address]       [place], [place]  [address]       Ph:  [phone]       Fax: [phone]   RxID:   [id number]

## 2011-01-24 NOTE — Miscellaneous (Signed)
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 

## 2011-01-26 NOTE — Assessment & Plan Note (Signed)
Summary: 6 months CAD/CABG/Palpitations  pfh,rn   Visit Type:  6 months follow up Primary Provider:  Madelin Headings MD   History of Present Illness: [name] is seen today post CABG in 2009.l  He has not had any SSCP palpitaitons or dyspnea.  He has leg cramps with most statins.  He is tolerating lipitor qod with an LDL of 82    He has prostate issues and flomax is working fairly well.  If his chills continue at night he will See Dr Isabel [name] to have his prostate rechecked and get some BC's in our office.  He has had recurrent hernias in the past and is having recurrent issues on the right.  I gave hiim Dr Maris Berger name as a referral.  Otherwise he has been compliant with his meds and walks on a daily basis.  He had some palpitations and Event monitor was benign.    He is excited to go to [facility] to see his daughter who is a Clinical research associate and got a new job Clinical biochemist for [employer].  Current Problems (verified): 1)  Neuritis  (ICD-729.2) 2)  Cough  (ICD-786.2) 3)  Back Pain, Lumbar  (ICD-724.2) 4)  Rhinitis, Chronic  (ICD-472.0) 5)  Palpitations  (ICD-785.1) 6)  Carotid Artery Disease  (ICD-433.10) 7)  Vitamin D Deficiency  (ICD-268.9) 8)  Back Pain  (ICD-724.5) 9)  ? 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 of Patent Foramen Ovale  (ICD-745.5) 25)  Hx, Personal, Malignancy, Skin Nec  (ICD-V10.83) 26)  Lumbar Radiculopathy, Right  (ICD-724.4) 27)  Colonic Polyps, Hx of   (ICD-V12.72) 28)  Advef, Drug/medicinal/biological Subst Nos  (ICD-995.20) 29)  Echocardiogram, Abnormal  (ICD-793.2) 30)  Benign Prostatic Hypertrophy  (ICD-600.00) 31)  Gerd  (ICD-530.81) 32)  Allergic Rhinitis  (ICD-477.9)  Current Medications (verified): 1)  Baby Aspirin 81 Mg  Chew (Aspirin) .Marland Kitchen.. 1 Tab By Mouth Once Daily 2)  Aciphex 20 Mg  Tbec (Rabeprazole Sodium) .Marland Kitchen.. 1 By Mouth Once Daily 3)  Singulair 10 Mg  Tabs (Montelukast Sodium) .Marland Kitchen.. 1 By Mouth Once Daily 4)  Flomax 0.4 Mg  Cp24 (Tamsulosin Hcl) .... Take One Tablet By Mouth Once Daily. 5)  Zyrtec Allergy 10 Mg Tabs (Cetirizine Hcl) .... As Needed 6)  Vitamin D 13086 Unit Caps (Ergocalciferol) .Marland Kitchen.. 1 Tab Every Other Wed 7)  Lipitor 40 Mg Tabs (Atorvastatin Calcium) .Marland Kitchen.. 1 Every Other Day 8)  Metoprolol Tartrate 50 Mg Tabs (Metoprolol Tartrate) .... 1/2 By Mouth Two Times A Day 9)  Co Q-10 30 Mg  Caps (Coenzyme Q10) .Marland Kitchen.. 1 Tab By Mouth Once Daily 10)  Multivitamins   Tabs (Multiple Vitamin) .Marland Kitchen.. 1 Tab By Mouth Once Daily 11)  Clobetasol Propionate 0.05 % Crea (Clobetasol Propionate) .... Apply To Back Two Times A Day 12)  Cyclobenzaprine Hcl 10 Mg Tabs (Cyclobenzaprine Hcl) .Marland Kitchen.. 1 By Mouth Three Times A Day As Needed Muscle Relaxant  Allergies: 1)  ! Darvocet  Past History:  Past Medical  History: Last updated: 11/24/2009 Current Problems:  CAD (ICD-414.00) HYPERLIPIDEMIA (ICD-272.4) HYPERTENSION (ICD-401.9) ATRIAL FIBRILLATION (ICD-427.31) UNS ADVRS EFF OTH RX MEDICINAL&BIOLOGICAL SBSTNC (ICD-995.29) HYPERGLYCEMIA, BORDERLINE (ICD-790.29 ? 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  Marjorie Smolder Dr. Cornelius Moras- Heart Surgeon  Past Surgical History: Last updated: 01/22/2009 ls surgery fx pelvis basal cell and early melanoma removal Inguinal herniorrhaphy Tonsillectomy  Hx of sinus polyps   CABG   2009     Family History: Last updated: 11/24/2009 Family History of Alcoholism/Addiction Family History of CAD Male 1st degree relative  father died post op clot CABG Family History Hypertension Family History Lung cancer Family History of Cardiovascular disorder Family History Breast cancer 1st degree relative  sister      Social History: Last updated: 11/24/2009 Retired  state of [place]  Married Alcohol use-yes Never Smoked      Review of Systems       Denies fever, malais, weight loss, blurry vision, decreased visual acuity, cough, sputum, SOB, hemoptysis, pleuritic pain, palpitaitons, heartburn, abdominal pain, melena, lower extremity edema, claudication, or rash.   Vital Signs:  Patient profile:   70 year old male Height:      69 inches Weight:      204 pounds BMI:     30.23 Pulse rate:   80 / minute Pulse rhythm:   regular Resp:     18 per minute BP sitting:   118 / 70  (left arm) Cuff size:   large  Vitals Entered By: Vikki Ports (December 08, 2010 9:26 AM)  Physical Exam  General:  Affect appropriate Healthy:  appears stated age HEENT: normal Neck supple with no adenopathy JVP normal no bruits no thyromegaly Lungs clear with no wheezing and good diaphragmatic motion Heart:  S1/S2 no murmur,rub, gallop or click PMI normal Abdomen: benighn, BS positve, no tenderness, no AAA no bruit.  No HSM or HJR Distal pulses intact with no bruits No edema Neuro non-focal Skin warm and dry    Impression & Recommendations:  Problem # 1:  PALPITATIONS (ICD-785.1) Benign no arrythmia on event monitor  Continue BB His updated medication list for this problem includes:    Baby Aspirin 81 Mg Chew (Aspirin) .Marland Kitchen... 1 tab by mouth once daily     Metoprolol Tartrate 50 Mg Tabs (Metoprolol tartrate) .Marland Kitchen... 1/2 by mouth two times a day  Problem # 2:  CAD (ICD-414.00) CABG 2009 no angina cotniue ASA and BB His updated medication list for this problem includes:    Baby Aspirin 81 Mg Chew (Aspirin) .Marland Kitchen... 1 tab by mouth once daily    Metoprolol Tartrate 50 Mg Tabs (Metoprolol tartrate) .Marland Kitchen... 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  Stable  F/U Grapey  Problem # 4:  HYPERLIPIDEMIA (ICD-272.4) Continue every other day lipitor  Does not need generic as insurance plan covers regular lipitor and $5 His updated medication list for this problem includes:    Lipitor 40 Mg Tabs (Atorvastatin calcium) .Marland Kitchen... 1 every other day

## 2011-01-26 NOTE — Progress Notes (Signed)
Summary: refills  Phone Note Call from Patient Call back at Home Phone [phone]   Caller: Patient Summary of Call: Pt needs to have written rx to take to [facility]. on all rx's. Initial call taken by: Romualdo Bolk, CMA Duncan Dull),  December 05, 2010 5:04 PM  Follow-up for Phone Call        wife aware that we will have the rx's ready when she comes in for lab work Follow-up by: Romualdo Bolk, CMA Duncan Dull),  December 05, 2010 5:05 PM    Prescriptions: METOPROLOL TARTRATE 50 MG TABS (METOPROLOL TARTRATE) 1/2 by mouth two times a day  #90 Tablet x 0   Entered by:   Romualdo Bolk, CMA (AAMA)   Authorized by:   Madelin Headings MD   Signed by:   Romualdo Bolk, CMA (AAMA) on 12/05/2010   Method used:   Print then Give to Patient   RxID:   [id number] LIPITOR 40 MG TABS (ATORVASTATIN CALCIUM) 1 every other day  #90 x 0   Entered by:   Romualdo Bolk, CMA (AAMA)   Authorized by:   Madelin Headings MD   Signed by:   Romualdo Bolk, CMA (AAMA) on 12/05/2010   Method used:   Print then Give to Patient   RxID:   [id number] FLOMAX 0.4 MG  CP24 (TAMSULOSIN HCL) Take one tablet by mouth once daily.  #180 Capsule x 0   Entered by:   Romualdo Bolk, CMA (AAMA)   Authorized by:   Madelin Headings MD   Signed by:   Romualdo Bolk, CMA (AAMA) on 12/05/2010   Method used:   Print then Give to Patient   RxID:   [id number] SINGULAIR 10 MG  TABS (MONTELUKAST SODIUM) 1 by mouth once daily  #90 Tablet x 0   Entered by:   Romualdo Bolk, CMA (AAMA)   Authorized by:   Madelin Headings MD   Signed by:   Romualdo Bolk, CMA (AAMA) on 12/05/2010   Method used:   Print then Give to Patient   RxID:   [id number] ACIPHEX 20 MG  TBEC (RABEPRAZOLE SODIUM) 1 by mouth once daily  #90 x 0   Entered by:   Romualdo Bolk, CMA (AAMA)   Authorized by:   Madelin Headings MD   Signed by:   Romualdo Bolk, CMA (AAMA) on 12/05/2010   Method used:   Print then  Give to Patient   RxID:   [id number]

## 2011-02-27 ENCOUNTER — Encounter: Payer: Self-pay | Admitting: Internal Medicine

## 2011-02-27 ENCOUNTER — Ambulatory Visit (INDEPENDENT_AMBULATORY_CARE_PROVIDER_SITE_OTHER): Payer: Medicare Other | Admitting: Internal Medicine

## 2011-02-27 VITALS — BP 120/80 | HR 64 | Temp 98.9°F | Wt 202.0 lb

## 2011-02-27 DIAGNOSIS — J069 Acute upper respiratory infection, unspecified: Secondary | ICD-10-CM

## 2011-02-27 DIAGNOSIS — B9689 Other specified bacterial agents as the cause of diseases classified elsewhere: Secondary | ICD-10-CM

## 2011-02-27 DIAGNOSIS — J329 Chronic sinusitis, unspecified: Secondary | ICD-10-CM

## 2011-02-27 MED ORDER — AZITHROMYCIN 250 MG PO TABS: 250.0000 mg | ORAL_TABLET | ORAL | Status: AC

## 2011-02-27 NOTE — Progress Notes (Signed)
  Subjective:    Patient ID: [name], male    DOB: 1941-01-26, 70 y.o.   MRN: [id number]  HPI Comes in today for acute visit after about 2 weeks of uri congestion worse than baseline and no increasing coughing now thick green phlegm. Some wheezy feeling in chest. No fever CP SOB . No hemoptysis.   Face and sinuses somewhat worse .  Not taking new meds for this .   Recently traveled back from [place] .     Personally reviewed  and updated past/ social hx  Review of Systems No fever sweats chest pain sob NVD .  No change in  Ms status .  No ets . No bleeding vision or hearing changes     Objective:   Physical Exam WDWN in nad looks tired and congested .  HEENT: Normocephalic ;atraumatic , Eyes;  PERRL, EOMs  Full, lids and conjunctiva clear,,Ears: no deformities, canals nl, TM landmarks normal, Nose: no deformity mucoid  discharge  Mouth : OP clear without lesion or edema .  Neck no masses or adenopathy Chest:  Clear to A&P without wheezes rales or rhonchi  ? Coarse BS  Well healed midline scar CV:  S1-S2 no gallops or murmurs peripheral perfusion is normal No clubbing cyanosis or edema  Nl cap perfusion Neuro  Grossly non focal         Assessment & Plan:  Poss bacterial bronchitis with prolonged uri Acute on chronic sinus congestion. CAD stable  Because of length of illness and progression will add antibiotic and if not getting better  Consider getting chest x ray.  Call with alarm sx for eval prn.    Expectant management.

## 2011-03-02 ENCOUNTER — Encounter: Payer: Self-pay | Admitting: Internal Medicine

## 2011-03-28 ENCOUNTER — Other Ambulatory Visit: Payer: Self-pay

## 2011-04-03 ENCOUNTER — Ambulatory Visit (INDEPENDENT_AMBULATORY_CARE_PROVIDER_SITE_OTHER)
Admission: RE | Admit: 2011-04-03 | Discharge: 2011-04-03 | Disposition: A | Discharge status: Home / Self Care | Payer: Medicare Other | Source: Ambulatory Visit | Attending: Internal Medicine | Admitting: Internal Medicine

## 2011-04-03 ENCOUNTER — Ambulatory Visit (INDEPENDENT_AMBULATORY_CARE_PROVIDER_SITE_OTHER): Payer: Medicare Other | Admitting: Internal Medicine

## 2011-04-03 ENCOUNTER — Encounter: Payer: Self-pay | Admitting: Internal Medicine

## 2011-04-03 VITALS — BP 100/70 | HR 66 | Wt 204.0 lb

## 2011-04-03 DIAGNOSIS — L259 Unspecified contact dermatitis, unspecified cause: Secondary | ICD-10-CM

## 2011-04-03 DIAGNOSIS — I10 Essential (primary) hypertension: Secondary | ICD-10-CM

## 2011-04-03 DIAGNOSIS — M25472 Effusion, left ankle: Secondary | ICD-10-CM | POA: Insufficient documentation

## 2011-04-03 DIAGNOSIS — M25476 Effusion, unspecified foot: Secondary | ICD-10-CM

## 2011-04-03 DIAGNOSIS — L309 Dermatitis, unspecified: Secondary | ICD-10-CM

## 2011-04-03 DIAGNOSIS — M25473 Effusion, unspecified ankle: Secondary | ICD-10-CM

## 2011-04-03 DIAGNOSIS — I2510 Atherosclerotic heart disease of native coronary artery without angina pectoris: Secondary | ICD-10-CM

## 2011-04-03 LAB — CBC WITH DIFFERENTIAL/PLATELET
Basophils Absolute: 0.1 10*3/uL (ref 0.0–0.1)
Eosinophils Absolute: 0.5 10*3/uL (ref 0.0–0.7)
Hemoglobin: 15.9 g/dL (ref 13.0–17.0)
Lymphocytes Relative: 20.8 % (ref 12.0–46.0)
MCHC: 34.4 g/dL (ref 30.0–36.0)
Monocytes Relative: 8.7 % (ref 3.0–12.0)
Neutrophils Relative %: 63.1 % (ref 43.0–77.0)
RDW: 13.8 % (ref 11.5–14.6)

## 2011-04-03 LAB — BASIC METABOLIC PANEL
Calcium: 9.4 mg/dL (ref 8.4–10.5)
GFR: 87.18 mL/min (ref >60.00)
Glucose, Bld: 121 mg/dL — ABNORMAL HIGH (ref 70–99)
Potassium: 4.1 mEq/L (ref 3.5–5.1)
Sodium: 138 mEq/L (ref 135–145)

## 2011-04-03 LAB — SEDIMENTATION RATE: Sed Rate: 5 mm/hr (ref 0–22)

## 2011-04-03 LAB — URIC ACID: Uric Acid, Serum: 5.2 mg/dL (ref 4.0–7.8)

## 2011-04-03 NOTE — Assessment & Plan Note (Signed)
No obvious injury but area of pain as the lateral ankle. Good imaging study and lab work. Consider orthopedic evaluation if persistent or progressive.

## 2011-04-03 NOTE — Progress Notes (Signed)
  Subjective:    Patient ID: [name], male    DOB: Nov 10, 1941, 70 y.o.   MRN: [id number]  HPI Patient comes in today with wife for an acute visit. He noticed the insidious onset of left foot and ankle discomfort he says a month ago but over the last few days has increased in pain in intensity. He denies a specific injury. He feels some swelling at the end of the day and his ankle mostly on the outside. It is decreased in the morning increased pain to walk. He has also developed a rash on the top of his foot that he is using clobetasol for a few days. He had a rash similar to this on the back of his neck and is seeing a dermatologist at [facility] dermatology. He has no history of gout and has not taken any medication for his ankle leg. He is able to walk and his tennis shoe without limping and it feels better when it was on. CAD HT  Stable.   Review of Systems No new chest pain shortness of breath skin redness claudication no fever sweats . No other new joint swelling. Rest as per HPI     Objective:   Physical Exam Well-developed well-nourished in no acute distress. Examination of his left lower extremity shows some mild swelling at the left lateral malleolus and retrocalcaneal area. Point tenderness around the anterior talofibular ligaments but no bony tenderness he has some pain on eversion and more on inversion. No joint swelling on his is no significant redness over the joint or warmth. Negative squeeze test.   ok   Pulse present no ulcers  Skin left foot a patch of feeding hyperemic macular rash on the dorsum of his left foot no vesicle noted  back of neck is clear.     Assessment & Plan:  Left foot and ankle pain. He is to be more related to the lateral ankle of the of the fifth metatarsal could be involved. If this history was more consistent it would seem like an ankle sprain. However this could be arthritis. We discussed the possibility of gout although the onset does not seem  typical. There is always a risk and has no history of same.  Wi'll get x-ray of the area and laboratory tests including a CBC sedimentation rate and uric acid. Realizing the limitations of these lab tests  discussed using a compression support but his wife said he won't do it anyway.    Rash on top of the foot this appears to be a dermatitis almost like a contact but may be fading has only been on the steroid cream for 2 days they will follow up with dermatology as planned.  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

## 2011-04-03 NOTE — Patient Instructions (Signed)
Will notify you  of labs when available. And also x ray report.  Then plan follow up

## 2011-04-04 NOTE — Progress Notes (Signed)
Pt's wife aware of results

## 2011-04-21 ENCOUNTER — Encounter: Payer: Self-pay | Admitting: Internal Medicine

## 2011-04-21 ENCOUNTER — Ambulatory Visit (INDEPENDENT_AMBULATORY_CARE_PROVIDER_SITE_OTHER): Payer: Medicare Other | Admitting: Internal Medicine

## 2011-04-21 ENCOUNTER — Ambulatory Visit (INDEPENDENT_AMBULATORY_CARE_PROVIDER_SITE_OTHER)
Admission: RE | Admit: 2011-04-21 | Discharge: 2011-04-21 | Disposition: A | Discharge status: Home / Self Care | Payer: Medicare Other | Source: Ambulatory Visit | Attending: Internal Medicine | Admitting: Internal Medicine

## 2011-04-21 VITALS — BP 110/80 | Temp 98.8°F | Wt 204.0 lb

## 2011-04-21 DIAGNOSIS — R059 Cough, unspecified: Secondary | ICD-10-CM | POA: Insufficient documentation

## 2011-04-21 DIAGNOSIS — J988 Other specified respiratory disorders: Secondary | ICD-10-CM

## 2011-04-21 DIAGNOSIS — R0789 Other chest pain: Secondary | ICD-10-CM

## 2011-04-21 DIAGNOSIS — R05 Cough: Secondary | ICD-10-CM | POA: Insufficient documentation

## 2011-04-21 DIAGNOSIS — I2510 Atherosclerotic heart disease of native coronary artery without angina pectoris: Secondary | ICD-10-CM

## 2011-04-21 NOTE — Progress Notes (Signed)
  Subjective:    Patient ID: [name], male    DOB: [birth year], 70 y.o.   MRN: [id number]  HPI  Patient comes in today with wife for acute visit. He had the sudden onset of upper respiratory congestion sneezing     3 days ago with malaise but no fever or chills. He has a runny nose.   Now complains of front of chest and heavy and sore with cough no hemoptysis no face pain. This pain and discomfort does not feel like his heart pain. It is a burning in his chest when he breathes.  No current tobacco no shortness of breath question of wheezing.  Has tried over-the-counter Mucinex DM with mild help. Because of the weekend to come in today for evaluation..    Past history family history social history reviewed in the electronic medical record.  Review of Systems No change in exercise tolerance hearing vision neck pain nausea vomiting diarrhea. His left ankle is much better the ankle brace since last visit no unusual bleeding. No hemoptysis. No syncope or neurologic symptoms.    Objective:   Physical Exam Well-developed well-nourished in no acute distress looks mildly fatigued no respiratory symptoms quiet respirations without laboring. HEENT: Normocephalic ;atraumatic , Eyes;  PERRL, EOMs  Full, lids and conjunctiva clear,,Ears: no deformities, canals nl, TM landmarks normal, Nose: no deformity Minimal discharge no face pain  Mouth : OP clear without lesion or edema . Neck no mass or bruit Chest:  Clear to A&P without wheezes rales or rhonchi CV:  S1-S2 no gallops or murmurs peripheral perfusion is normal RR  Abd non tender   No clubbing or cyanosis or sig edema.     Assessment & Plan:  Acute respiratory infection with cough and chest discomfort. His pulse ox symmetry is good.  I do not hear pneumonia and this is probably viral however he is at high risk. Discussed options. We'll get chest x-ray today and if negative for acute disease. Will observe this as a viral respiratory infection.  He'll call with alarm features or persistence or progression.Marland Kitchen  Avoid decongestants but can use some of the other of the counters if helpful. Can call next week if needed.   Total visit > 50% spent counseling and coordinating care

## 2011-04-21 NOTE — Progress Notes (Signed)
Patient is aware 

## 2011-04-21 NOTE — Patient Instructions (Signed)
This may be a viral  Respiratory infection but chest x ray will  May sure you dont have pneumonia. Continue meds  and tylenol .  Will notify you of chest  X ray results  when back

## 2011-05-09 NOTE — Assessment & Plan Note (Signed)
OFFICE VISIT   [name], [name]  DOB:  [birth year]                                        November 02, 2008  CHART #:  [id number]   HISTORY:  The patient is a 70 year old gentleman who returns on today's  date for office visit for followup on a sternal wound cellulitis.  The  patient previously underwent a coronary artery bypass grafting x5 on  September 11, 2008, by Dr. Tressie Stalker.  He was seen on October 22, 2008, in the office by Dr. Cornelius Moras where he was found to have some small  areas of redness and swelling along the sternal incision, which he  thought looked perhaps like a small abscess.  He applied Neosporin  ointment and was instructed to follow up in the office.  He was seen by  Dr. Cornelius Moras and felt to have a mild cellulitis involving the sternal  incision and was given a 10-day course for oral Keflex.  He returns on  today's date for followup.  Currently, he reports that he is doing well.  The erythema has shown significant improvement.  He denies fevers,  chills, or other constitutional symptoms.  He does have an occasional  cough with some chest discomfort, but no obvious click in the sternum  itself.  He feels as though he is progressing nicely in his overall  recovery.   PHYSICAL EXAMINATION:  VITAL SIGNS:  Blood pressure is 100/64, pulse is  80, respirations 18, and oxygen saturation is 95% on room air.  GENERAL:  A well-developed, adult male in no acute distress.  The  incision is healing well.  There is no signs currently of erythema,  flocculence, or any type of drainage.  There is no sternal click.  CARDIAC:  Regular rate and rhythm.  Normal S1 and S2.  PULMONARY:  Lungs are clear.  EXTREMITY:  Trace edema.   ASSESSMENT:  The patient has continued his progressive recovery  following his coronary revascularization.  He shows no current evidence  of ongoing sternal incision cellulitis and has  gotten a good result from the Keflex.  At this  point, we can now see him  again on a p.r.n. basis if he has any further surgically related  difficulties.   Rowe Clack, P.A.-C.   Sherryll Burger  D:  11/02/2008  T:  11/02/2008  Job:  [id number]   cc:   [facility] Office

## 2011-05-09 NOTE — Assessment & Plan Note (Signed)
[facility]                            CARDIOLOGY OFFICE NOTE   NAME:[name], [name]                       MRN:          [id number]  DATE:01/07/2009                            DOB:          February 23, 1941    [name] returns today for followup.  He had coronary bypass surgery in  September.  He had a small area of serous drainage from his chest, which  is now healed.  There was no febrile illness or evidence of significant  sternal wound infection.  From a cardiac perspective, he is not having  significant chest pain.  There is no PND, orthopnea, no palpitations.   He does have some postural symptoms.  He has had these for a long time,  particularly in the morning when he gets up, he feels a little bit oozy  and dizzy.  The symptoms can last for about an hour.  Again, he has had  them for more than 5 years prior to his cardiac surgery.  There was no  vertebrobasilar insufficiency or carotid disease.  He has not had frank  syncope and this sense of dizziness is not related to true vertigo  and/or palpitations.   The patient also complains of significant sinus infection.  He had some  leftover penicillin that he took with some improvement.  However, his  previous anginal symptoms involve some right-sided face and jaw pain and  he was concerned because he feels his sinuses are still congested.   He has not had any sputum production.  He has had a bit of a headache  with the right maxillary pain.  There has been no phonophobia and there  has been a bit of neuralgia on this side.   The patient has been finishing cardiac rehab.  He has been doing well.  He asked when he can go back to golfing.  I told him he could golf now,  but I would not drive off the [facility] March.   His review of systems otherwise negative.   CURRENT MEDICATIONS:  1. Coenzyme Q.  2. Flomax 0.4 mg a day.  3. Aciphex 20 a day.  4. An aspirin a day.  5. Singulair 10 a day.  6. Zyrtec 10 a  day.  7. Lipitor 40 a day.  8. Metoprolol 25 b.i.d.  9. He uses a CVES on [place] in [place].   FAMILY HISTORY:  Noncontributory.   PHYSICAL EXAMINATION:  GENERAL:  Remarkable for an elderly male in no  distress.  VITAL SIGNS:  Weight is 198, blood pressure 110/70, he is not postural,  pulse 73 and regular, respiratory 14, afebrile.  HEENT:  Unremarkable.  NECK:  Carotids are normal without bruit.  No lymphadenopathy,  thyromegaly, or JVP elevation.  LUNGS:  Clear with good diaphragmatic motion.  No wheezing.  S1 and S2  with normal heart sounds.  PMI normal.  Sternum is well-healed with no  serous drainage or areas of dehiscence.  ABDOMEN:  Benign.  Bowel sounds positive.  No AAA, no tenderness, no  bruit, no hepatosplenomegaly, no hepatojugular, or tenderness.  EXTREMITIES:  Pulses are intact.  No edema.  NEURO:  Nonfocal.  SKIN:  Warm and dry.  No muscular weakness.  He is status post  endovascular harvesting with the medial incisions across from both  knees.  He interestingly has an abundance of hair growth on the right  leg likely from having it shaved prior to surgery.   His electrocardiogram shows sinus rhythm with nonspecific ST-T-wave  changes, poor R-wave progression.   IMPRESSION:  1. Coronary artery disease, previous coronary artery bypass grafting.      Continue aspirin, beta-blocker.  Currently not having any chest      pain.  Sternum well healed now.  2. Dizziness in the setting of sinus infection.  We will check a      noncontrast head CT to rule out acoustic neuroma since this is a      chronic problem and also to check his sinuses for resolution.  He      may benefit from an ENT referral.  3. Hypertension, currently well controlled.  Continue low-sodium diet      and current medications.  4. Hyperlipidemia in the setting of coronary artery disease.  Continue      Lipitor.  Lipid and liver profile in 6 months.  5. 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      Briefly, currently maintaining sinus rhythm.  He will call me if      there is any palpitations.  He will continue aspirin.  6. Rehabilitation.  The patient will finish his cardiac rehab this      week.  He will then slowly get back into his golfing, otherwise he      has no significant limitations.  He will follow up with his primary      care MD for his other medical problems.  7. History of reflux.  Continue p.r.n. Aciphex.  Avoid late meals and      spicy foods.  Avoid recumbency on a full stomach.     Noralyn Pick. Eden Emms, MD, [facility]  Electronically Signed    PCN/MedQ  DD: 01/07/2009  DT: 01/07/2009  Job #: [id number]

## 2011-05-09 NOTE — Assessment & Plan Note (Signed)
[name]                            CARDIOLOGY OFFICE NOTE   NAME:[name], GER[place]                       MRN:          [id number]  DATE:09/07/2008                            DOB:          26-Jul-1941    [name] was seen today as an add-on and he had a high-risk Myoview  study.   The patient was initially referred by Dr. Fabian Chambers for dyspnea, chest  pain, and a history of rheumatic fever.   He has been having exertional dyspnea and diaphoresis.  He gets  occasional chest tightness.  His Myoview showed evidence of multivessel  disease with inferolateral wall ischemia from base to apex and also an  area of anterior wall ischemia.  I talked to the patient at length about  his results and showed him his pictures.  I explained to him I would  like to proceed with diagnostic heart cath as soon as possible.  We will  also add long-acting Imdur to his beta-blocker and ACE inhibitor in  terms of his symptoms.  He has not had any rest pain.  He knows to go to  the hospital immediately for any significant recurrent episodes.   The risks of cath were discussed including stroke, need for emergency  surgery, bleeding, and myocardial infarction.  His wife had a lot of  questions in regards to this and also in regards to a referral for open-  heart surgery.  I explained to her that we had very good surgeons here  in town and he would not need to be referred elsewhere.  I also  explained to them that I did not have a day in the lab this week, but I  wanted him done as soon as possible and one of my partners would do his  diagnostic cath.   They seem to understand the need for surgery and also the need for  diagnostic cath in the wrist.   ALLERGIES:  He is allergic to DARVOCET.   CURRENT MEDICATIONS:  1. Flomax 0.4 a day.  2. Metoprolol 25 b.i.d.  3. Benicar 20 a day.  4. Aciphex 20 a day.  5. An aspirin a day.  6. Singulair 10 a day.  7. Zyrtec 10 a  day.  8. Multivitamins.  9. Saw palmetto.  10.Crestor 5 mg every other day.  11.Imdur 30 a day to be started this noon.   PHYSICAL EXAMINATION:  GENERAL:  Remarkable for healthy-appearing male  in no distress.  VITALS:  His blood pressure 130/80, pulse 70 and regular, respiratory  rate 14, and afebrile.  HEENT:  Unremarkable.  NECK:  Carotids normal without bruit.  No lymphadenopathy, thyromegaly,  or JVP elevation.  LUNGS:  Clear.  Good diaphragmatic motion.  No wheezing.  CARDIAC:  S1, S2 with normal heart sounds.  ABDOMEN:  Benign.  Bowel sounds positive.  No AAA.  No tenderness.  No  bruit.  No hepatosplenomegaly or hepatojugular reflux.  No tenderness.  EXTREMITIES:  Distal pulses are intact.  No edema.  NEURO:  Nonfocal.  SKIN:  Warm and dry.  MUSCULOSKELETAL:  No muscular weakness.   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  Myoview is high-risk showing inferolateral wall ischemia as  well as an area of anterior wall ischemia.   IMPRESSION:  1. Diagnostic heart cath to be performed, add Imdur.  Significant      evidence for multivessel coronary disease.  2. Hypertension, currently well controlled.  Continue current dose of      Benicar.  3. Hypercholesterolemia, continue Crestor.  4. Reflux, continue Aciphex.  May need to reassess use of proton pump      inhibitors if he needs to be placed on Plavix.  5. History of prostatism, continue Flomax.  PSA in 6 months.   Considerable time was spent with the patient and his wife today going  over the results of the stress test and formulating a plan for  diagnostic heart cath.     Jon Pick. Eden Emms, MD, [facility]  Electronically Signed    PCN/MedQ  DD: 09/07/2008  DT: 09/08/2008  Job #: [id number]

## 2011-05-09 NOTE — Consult Note (Signed)
[name], [name] NO.:  [id number]   MEDICAL RECORD NO.:  [id number]          Chambers TYPE:  INP   LOCATION:  3311                         FACILITY:  [facility]   PHYSICIAN:  Salvatore Decent. Cornelius Moras, M.D. DATE OF BIRTH:  02-Jan-1941   DATE OF CONSULTATION:  09/09/2008  DATE OF DISCHARGE:                                 CONSULTATION   REQUESTING PHYSICIAN:  Noralyn Pick. Eden Emms, MD, [facility]   REASON FOR CONSULTATION:  Severe three-vessel coronary artery disease.   HISTORY OF PRESENT ILLNESS:  [name] is a 70 year old gentleman  with no previous history of coronary artery disease, but risk factors  notable for history of hypertension, hyperlipidemia, remote history of  tobacco use, and a family history of coronary artery disease.  Jon  Chambers was originally referred to Dr. 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  More recently, [name] was referred back to Dr.  Eden Emms for Jon new development of symptoms of exertional shortness of  breath and chest discomfort.  [name] states that for probably Jon  past 6 months or so, [name] has had some mild tightness that occurs in his  neck, left shoulder, and upper chest that is usually associated with  strenuous physical activity and relieved by rest.  These symptoms have  gone off and on sporadically until approximately 2 months ago when [name]  developed particularly severe episode while [name] was attending a wedding.  This tightness was associated with severe diaphoresis and shortness of  breath.  Jon symptoms resolved after about 30 minutes.  Since then, [name]  has continued to have some intermittent episodes that are similar, again  usually associated with walking up a hill or other relatively strenuous  activity.  [name] returned to see Dr. Charlton Haws and his stress Cardiolite  exam was performed.  This was abnormal and notable for evidence  suggestive of multivessel coronary artery disease with inferolateral  wall ischemia and anterior wall ischemia.  [name] subsequently was  brought in for elective cardiac catheterization today.  This was  performed by Dr. Marca Ancona and Dr. Tonny Bollman.  Findings are  notable for severe three-vessel coronary artery disease with preserved  left ventricular function and coronary anatomy relatively unfavorable  for percutaneous coronary intervention.  Cardiothoracic surgical  consultation was requested.   REVIEW OF SYSTEMS:  GENERAL:  [name] reports normal appetite.  [name]  is actually gained a few pounds over Jon last couple of months due to  decreased activity.  CARDIAC:  Notable for a 76-month history of  progressive symptoms of exertional tightness in his neck, left shoulder  and upper chest that is usually brought on with physical activity and  relieved by rest.  [name] denies any episodes of prolonged chest  discomfort lasting more than 30 minutes.  [name] denies PND,  orthopnea, or lower extremity edema.  [name] has occasional  tachypalpitations and dizzy spells.  [name] denies any syncopal episodes.  RESPIRATORY:  Notable only for Jon presence of shortness of breath that  occurs with activity during episodes of chest discomfort.  [name]  otherwise has no exertional shortness of breath.  [name] denies  persistent cough or history of hemoptysis.  GASTROINTESTINAL:  Notable  for some symptoms of reflux that are controlled with medical treatment.  [name] has no difficulty swallowing.  [name] does report  irregular bowel function, although [name] denies any problems with severe  constipation or diarrhea.  [name] has no history of hematemesis,  hematochezia, or melena.  MUSCULOSKELETAL:  Notable for chronic aches  and pains related to degenerative disk disease of Jon cervical spine and  lumbar spine.  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  This involves Jon right foot.  [name] denies  symptoms suggestive of previous TIA or stroke.  GENITOURINARY:  Notable  for longstanding symptoms of benign prostatic hypertrophy which are  controlled.  [name] denies urgency or dysuria.  HEENT:  Negative.  PSYCHIATRIC:  Negative.   PAST MEDICAL HISTORY:  1. Hypertension.  2. Hyperlipidemia.  3. 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  4. Benign prostatic hypertrophy.  5. GE reflux disease.   PAST SURGICAL HISTORY:  1. Multiple surgeries for bilateral inguinal hernias and recurrent      inguinal hernias.  2. Sinus surgery for recurrent sinus infections.  3. Lumbar laminectomy and diskectomy.   Family history is notable in [name]'s father, suffered heart attack  relatively young age and ultimately died during open heart surgery.   SOCIAL HISTORY:  [name] is married and lives with his wife here  locally in [place].  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  [name] has a distant past history of tobacco use,  although [name] quit smoking many years ago.  [name] denies excessive alcohol  consumption.   CURRENT MEDICATIONS:  1. Flomax 0.4 mg daily.  2. Metoprolol 25 mg twice daily.  3. Benicar 20 mg daily.  4. Aciphex 20 mg daily.  5. Aspirin 325 mg daily.  6. Singulair 10 mg daily.  7. Zyrtec 10 mg daily.  8. Multivitamin.  9. Saw palmetto.  10.Crestor 5 mg every other day.  11.Imdur 30 mg a day.   DRUG ALLERGIES:  [name] has had sensitivity to most STATIN DRUGS  with muscle aches and pain.   PHYSICAL EXAM:  GENERAL:  [name] is a well-appearing male who  appears his stated age in no acute distress.  HEENT:  Unrevealing.  NECK:  Supple.  There is no cervical or supraclavicular lymphadenopathy.  There is no jugular venous distention.  No carotid bruits are noted.   CHEST:  Auscultation of Jon chest demonstrates clear breath sounds,  which are symmetrical bilaterally.  No wheezes or rhonchi are noted.  CARDIOVASCULAR:  Notable for regular rate and rhythm.  No murmurs, rubs,  or gallops noted.  ABDOMEN:  Soft, nondistended, and nontender.  Bowel sounds are present.  EXTREMITIES:  Warm and well perfused.  There is no lower extremity  edema.  Pulses are palpable in Jon posterior tibial positions in both  feet.  There is no sign of venous insufficiency.  SKIN:  Clean, dry, and healthy-appearing throughout.  RECTAL AND GU:  Deferred.  NEUROLOGIC:  Grossly nonfocal and symmetrical throughout.   DIAGNOSTIC TEST:  Cardiac catheterization performed earlier today is  reviewed.  This demonstrates severe three-vessel coronary artery disease  with normal left ventricular function.  Specifically, there is 30%  stenosis of Jon distal left main coronary artery with heavily-calcified  70-80% ostial and proximal stenosis of Jon left anterior descending  coronary artery.  There is 70% proximal stenosis of Jon first diagonal  branch.  There is 80-90% stenosis of Jon left circumflex coronary artery  just after takeoff of Jon first circumflex marginal branch.  There is  60% proximal stenosis of Jon circumflex marginal branch.  There is right  dominant coronary circulation.  There is 90% stenosis of Jon mid right  coronary artery.  There is 80% ostial stenosis of Jon posterior  descending coronary artery and there is 70% stenosis of Jon distal right  coronary artery beyond Jon bifurcation with Jon posterior descending  coronary artery.  Ejection fraction estimated roughly 55%.  There are no  significant wall motion abnormalities.   IMPRESSION:  Severe three-vessel coronary artery disease with symptoms  of angina pectoris, normal left ventricular function, and coronary  anatomy relatively unfavorable for percutaneous coronary intervention.  I agree that [name]  would best be treated with surgical  revascularization.   PLAN:  I have discussed options at length with [name] and his  family this afternoon.  Alternative treatment strategies have been  reviewed.  They understand and accept all potential associated risks of  surgery including but not limited to risk of death, stroke, myocardial  infarction, congestive heart failure, respiratory failure, pneumonia,  bleeding requiring blood transfusion, arrhythmia, infection, and  recurrent coronary artery disease.  All of their questions have been  addressed.  We tentatively plan to proceed with surgery on Friday  September 11, 2008.      Salvatore Decent. Cornelius Moras, M.D.  Electronically Signed     CHO/MEDQ  D:  09/09/2008  T:  09/09/2008  Job:  [id number]   cc:   Noralyn Pick. Eden Emms, MD, [facility]  Marca Ancona, MD  Neta Mends. Fabian Sharp, MD

## 2011-05-09 NOTE — Assessment & Plan Note (Signed)
OFFICE VISIT   BACH, ROCCHI  DOB:  [birth year]                                        October 12, 2008  CHART #:  [id number]   HISTORY OF PRESENT ILLNESS:  The patient returns for further followup  status post coronary artery bypass grafting x5 on September 11, 2008.  He was last seen here in the office on September 21, 2008.  Since then  the patient has continued to do quite well.  He was seen in the office  last week by Dr. Eden Emms.  He was taken off of amiodarone at that time.  He was given a prescription for doxycycline due to what was felt to be  probable mild superficial cellulitis involving his right lower leg  endoscopic vein harvest incision.  He otherwise continues to do  extremely well and he returns to the office for routine followup today.  The patient reports he feels quite good at this time.  He has no  shortness of breath.  He has minimal chest discomfort and he is no  longer taking any pain medicines other than Tylenol.  His activity level  is quite good and he is walking up to an hour at each time everyday.  His exercise tolerance is excellent.  His appetite is good.  He is  sleeping well at night.  He has no complaints.   PHYSICAL EXAMINATION:  GENERAL:  Notable for well-appearing male.  VITAL SIGNS:  Blood pressure 116/77, pulse 73 and regular, and oxygen  saturation 94% on room air.  CHEST:  Reveals a median sternotomy incision that is healing nicely.  The sternum is stable on palpation.  The breath sounds are clear to  auscultation and symmetrical bilaterally.  CARDIOVASCULAR:  Includes regular rate and rhythm.  No murmurs, rubs, or  gallops are noted.  ABDOMEN:  Soft and nontender.  EXTREMITIES:  Warm and well perfused.  The small incision just below the  right knee from endoscopic vein harvest has healed completely.  There is  no sign of ongoing infection at all and there is no surrounding redness  or cellulitis.  There is no  lower extremity edema.   DIAGNOSTIC TEST:  Chest x-ray obtained today at the [facility] is reviewed.  This reveals clear lung fields bilaterally.  There  are no pleural effusions.  All the sternal wires appeared intact.   IMPRESSION:  Satisfactory progress following recent coronary artery  bypass grafting.   PLAN:  I have encouraged the patient to continue to increase his  physical activity as tolerated with his only limitation at this point  remaining that he refrain from heavy lifting or strenuous use of his  arms or shoulders for at least another 2 months.  I think, he can go  back to driving an automobile.  I have encouraged him to get started in  the cardiac rehabilitation program.  All of his questions have been  addressed.  We have not made any changes in his current medications.  In  the future, he will call and return to see [place] here at [facility] only should further problems or difficulties arise.   Salvatore Decent. Cornelius Moras, M.D.  Electronically Signed   CHO/MEDQ  D:  10/12/2008  T:  10/12/2008  Job:  [id number]   cc:  Wallis Bamberg. Johnsie Cancel, MD, [facility]

## 2011-05-09 NOTE — Assessment & Plan Note (Signed)
OFFICE VISIT   [name], [name]  DOB:  1941-08-16                                        October 22, 2008  CHART #:  [id number]   HISTORY OF PRESENT ILLNESS:  The patient returns as an unscheduled visit  to the office today, having previously undergone coronary artery bypass  grafting x5 on September 11, 2008.  He was last seen here in the office  10 days ago at which time, he was doing quite well.  The patient has  continued to do well overall and is now actively taking part in the  cardiac rehabilitation program.  He noticed some small areas of redness  and swelling along his sternal incision, which he thought looked perhaps  like a small abscess.  He applied Neosporin ointment.  He called the  office and subsequently was instructed to come to the office today for  further evaluation.  He has not had any fevers or chills.  He has not  had any night sweats.  He is otherwise feeling quite well.  He has no  significant pain in the chest whatsoever.  He has not had any drainage  from the sternal incision.  The remainder of his review of systems is  noncontributory.  The remainder of his past medical history is  unchanged.   PHYSICAL EXAMINATION:  GENERAL:  Notable for a well-appearing male who  appears of stated age, in no acute distress.  VITAL SIGNS:  Blood pressure 124/76, pulse 72, and temperature 97.3  degrees Fahrenheit.  CHEST:  Reveals that the sternal incision remains entirely intact.  There is no tenderness on palpation at all up and down either side of  the sternum.  There is no surrounding cellulitis.  There is some mild  areas of redness along the sternal incision which is not very  impressive.  On palpation, there is no associated fluctuance.  No  drainage can be elicited.  The appearance of the redness is almost  suggestive of mild folliculitis, although the induration is really very  mild.  There is nothing to suggest there is an underlying  stitch abscess  or deep sternal wound infection.  The remainder of his physical exam is entirely unrevealing.   IMPRESSION:  Question mild cellulitis involving sternal incision.   PLAN:  We will give the patient a prescription for oral Keflex 250 mg  p.o. 3 times daily to take for the next 10 days.  We will plan to see  him back in 2 weeks.  He will call or return sooner should he develop  any increased redness, chest pain, drainage, or fever.  All of his  questions have been addressed.   Salvatore Decent. Cornelius Moras, M.D.  Electronically Signed   CHO/MEDQ  D:  10/22/2008  T:  10/22/2008  Job:  [id number]

## 2011-05-09 NOTE — Assessment & Plan Note (Signed)
[facility]                            CARDIOLOGY OFFICE NOTE   NAME:[name], [name]                       MRN:          [id number]  DATE:10/05/2008                            DOB:          Jul 28, 1941    [name] returns for followup.  He had recent coronary artery bypass  surgery.  He was Dr. Cornelius Moras in followup.  There is a superficial drainage  from his sternum.  He was treated with Keflex for 5 days.  It seems to  have improved.  There has been no evidence of systemic or sternal wound  infection.   He is otherwise doing well.  He and his wife had a myriad of questions  today.  Their daughter is getting married and they wanted to know if he  could drive.  I told him usually not for 4 weeks, but he could certainly  be a passenger.  I told him not to be in the car for more than 2 hours  at a time without getting out to stretch his legs.  He continues to have  some pain and swelling with erythema in the right lower extremity.  After looking at it, I told him we would probably add doxycycline to his  regimen.  He is to see Dr. Cornelius Moras on Monday.  At that point, he can be  rechecked and he is to have his chest x-ray then.  He has had some  musculoskeletal-type pain in his neck and chest.  There has been no  evidence of recurrent angina.  He had PAF in the perioperative period.  It was not recurred.  I told him we would stop his amiodarone today.  His blood pressure is little low and he needs to cut back on his  Lopressor, otherwise he is doing fine.   ALLERGIES:  He is allergic to DARVOCET.   CURRENT MEDICATIONS INCLUDE:  1. Flomax 0.4 a day.  2. Aciphex 20 a day.  3. Aspirin a day.  4. Singulair 10 a day.  5. Zyrtec 10 a day.  6. Keflex 500 mg b.i.d. for 5 days, which he has done with Lipitor 40      a day.  7. Folic acid.  8. Amiodarone to be discontinued.  9. Metoprolol to be cut back to 25 b.i.d.  10.Tramadol p.r.n. for pain.  11.He uses the  [facility] on [address] in [place].   PHYSICAL EXAMINATION:  GENERAL:  Remarkable for healthy-appearing male  in no distress.  VITAL SIGNS:  Blood pressure is 100/70, pulse 71 and regular,  respiratory rate 14, afebrile, and weight 197.  HEENT:  Unremarkable.  Carotids normal without bruit.  No  lymphadenopathy, thyromegaly, or JVP elevation.  LUNGS:  Clear.  Good diaphragmatic motion.  No wheezing.  HEART:  S1 and S2 with normal heart sounds.  PMI normal.  Sternum is  well healed without any serous drainage.  No crepitus.  ABDOMEN:  Benign.  Bowel sounds positive.  No AAA.  No tenderness, no  bruit, no hepatosplenomegaly, no hepatojugular reflux.  EXTREMITIES:  Distal pulses are intact.  There is trace edema in the  right lower extremity.  He has a bit of erythema around the endoscopic  vein harvest site in the right leg with a small cord, but no evidence of  DVT.  Distal pulses are intact.  NEURO:  Nonfocal.  SKIN:  Warm and dry.  No muscular weakness.   IMPRESSION:  1. Coronary artery disease status post coronary artery bypass graft.      Continue aspirin and beta-blocker.  2. Question superficial infection with thrombophlebitis in the right      lower extremity.  Elevate leg at the end of the night.  Warm      compresses.  Doxycycline 100 b.i.d. for 7 days.  Follow up with Dr.      Cornelius Moras next Monday.  3. History of paroxysmal atrial fibrillation.  Currently maintaining      sinus rhythm.  Continue low-dose beta-blocker.  Stop amiodarone.  4. Hyperlipidemia in the setting of coronary artery disease.  Continue      Lipitor.  Lipid and liver profile in 6 months.  5. Seasonal allergies.  Continue Singulair and Zyrtec p.r.n.  6. History of reflux, improved.  Continue Aciphex 20 a day.   I will see the patient back in 3 months.     Noralyn Pick. Eden Emms, MD, [facility]  Electronically Signed    PCN/MedQ  DD: 10/05/2008  DT: 10/06/2008  Job #: [id number]

## 2011-05-09 NOTE — Discharge Summary (Signed)
[name], [name] NO.:  [id number]   MEDICAL RECORD NO.:  [id number]          PATIENT TYPE:  INP   LOCATION:  2015                         FACILITY:  [facility]   PHYSICIAN:  Salvatore Decent. Cornelius Moras, M.D. DATE OF BIRTH:  March 04, 1941   DATE OF ADMISSION:  09/09/2008  DATE OF DISCHARGE:  09/16/2008                               DISCHARGE SUMMARY   ADMITTING DIAGNOSES:  1. Multivessel coronary artery disease (with an ejection fraction of      55%).  2. History of hypertension.  3. History of hyperlipidemia.  4. History of remote tobacco use.  5. History of gastroesophageal reflux disease.  6. History of benign prostatic hypertrophy.   DISCHARGE DIAGNOSES:  1. Multivessel coronary artery disease (with an ejection fraction of      55%).  2. History of hypertension.  3. History of hyperlipidemia.  4. History of remote tobacco use.  5. History of gastroesophageal reflux disease.  6. History of benign prostatic hypertrophy.  7. Postoperative paroxysmal atrial fibrillation and flutter with      increased ventricular rate (conversion to normal sinus rhythm).   PROCEDURES:  1. Cardiac catheterization performed on September 09, 2008, by Dr.      Shirlee Latch (left anterior descending showed a hazy lesion proximally      with a stenosis of up to 90%, first diagonal branch totally      occluded 50-60% proximal circumflex stenosis, 90% proximal right      coronary artery and 80% posterior descending artery with an      ejection fraction of 55%).  2. Coronary artery bypass grafting surgery x5 (left internal mammary      artery to left anterior descending, saphenous vein graft to first      diagonal branch, saphenous vein graft to second circumflex marginal      branch, saphenous vein graft to posterior descending coronary      artery with sequential saphenous vein graft to second right      posterolateral branch with endoscopic vein harvesting of the right      lower extremity by  Dr. Cornelius Moras on September 11, 2008.   HISTORY OF PRESENTING ILLNESS:  This is a 70 year old Caucasian male  with a past medical history of hypertension, hyperlipidemia, remote  history of tobacco use, and a family history of coronary artery disease  who presented with symptoms of exertional shortness of breath and chest  discomfort.  According to medical records, this has been going on for  about the last 6 months.  He described the chest discomfort as mild  tightness that occurred in his left upper chest, left neck, and left  shoulder.  It was usually associated with strenuous physical activity,  but relieved by rest.  About 2 months ago, he developed these symptoms  while he was attending a wedding, but also associated with these  aforementioned symptoms with severe diaphoresis.  The symptoms resolved  after approximately 30 minutes.  A Cardiolite exam was performed by Dr.  Eden Emms.  The results were abnormal and findings were suggestive of  multivessel coronary artery disease with inferolateral  wall ischemia and  anterior wall ischemia.  The patient then presented to [facility] on  September 09, 2008, to undergo the aforementioned cardiac  catheterization by Dr. Shirlee Latch and Dr. Excell Seltzer.  The patient was found to  have multivessel coronary artery disease with preserved EF.  Cardiothoracic consultation was obtained with Dr. Cornelius Moras.  It should be  note that the patient had undergone carotid studies preoperatively.  There was no evidence of significant internal carotid artery stenosis.  The patient then underwent the aforementioned coronary artery bypass  grafting surgery with Dr. Cornelius Moras on September 11, 2008.   BRIEF HOSPITAL COURSE STAY:  The patient was extubated later the evening  of surgery.  Drips were weaned as tolerated.  He later went into atrial  fibrillation with increased ventricular rate into the 120s and 130s.  He  was placed on an amiodarone drip.  He then converted to normal sinus   rhythm.  The amiodarone drip was discontinued and he was placed on p.o.  amiodarone.  The patient was anemic postoperatively.  H and H was  monitored closely.  He did not require any transfusion.  He was started  on Nu-Iron.  Chest tubes were removed by September 13, 2008.  Followup  chest x-ray revealed no pneumothorax, left lower lobe atelectasis,  however.  The patient was transferred from CVICU to 2000 for further  convalescence.  The patient had some complaints of dizziness on  postoperative day #3.  He was again found to have PAF, flutter with  increased ventricular rate in the 120s-140s.  He was given Lopressor IV  the evening of September 13, 2008, as well as the morning of September 14, 2008, because of increased ventricular rate despite both of these  doses.  Amiodarone drip was resumed.  The patient already had been  previously placed on a Lopressor.  This was increased 3 times daily as  well as amiodarone p.o.  The patient again converted to normal sinus  rhythm where he remained until discharge.  The patient was volume  overloaded.  He was diuresed accordingly.  He was also found to have  thrombocytopenia, but gradually his platelets continued to increase,  last platelet count was found to be 166,000.  Blood pressure medications  were adjusted accordingly over the next couple of days.  The patient was  weaned off oxygen, continued to progress with cardiac rehab and  currently on postop day #5 the patient had some complaints of postnasal  drip and occasional cough.   LABORATORY STUDIES:  PT and INR were 15.4 and 1.2 respectively.  Last  BMET done September 15, 2008, potassium 4, BUN and creatinine were 16  and 0.68 respectively.  Last CBC also done this date, H and H was 8.6  and 24.9, white count of 9100, platelet count of 166,000.  Last chest x-  ray was done on September 14, 2008, which showed improving aeration of  the left base.  A small left pleural effusion with  atelectasis as well  as mild atelectasis of the right base.   PHYSICAL EXAMINATION:  VITAL SIGNS:  T-max of 99.5, but later became  afebrile, heart rate in the 90s, BP 115-120s/70s, O2 sat was 99% on room  air, preop weight 93 kg, today's weight was down to 96.3 kg.  CBG 112,  135, and 104 respectively.  CARDIOVASCULAR:  Regular rate and rhythm.  PULMONARY:  Clear to auscultation bilaterally.  No rales, wheezes, or  rhonchi.  ABDOMEN:  Soft, nontender, occasional bowel sounds.  EXTREMITIES:  Trace edema right greater than left.  Right lower  extremity wounds are clean, dry and continue to be heal.   DISCHARGE INSTRUCTIONS:  The patient is going to be discharged home  today.  The patient was instructed he is not to drive or lift more than  10 pounds.  He is to continue with his breathing exercises daily, he is  to walk every day and increase his frequency and durations as tolerated.  He is to remain on a low-fat, low-salt diet.  He may shower.  He is to  cleanse his wounds with soap and water.  Regarding followup  appointments, the patient needs to contact Dr. Fabio Bering office for  followup appointment in 2 weeks.  He also has an appointment to see Dr.  Cornelius Moras on October 12, 2008.  Prior to this office appointment, a chest x-  ray will be obtained.   Discharge medications are as follows;  1. EC-ASA 81 mg p.o. daily.  2. Singulair 10 mg p.o. daily.  3. Zyrtec 10 mg p.o. daily.  4. Aciphex 20 mg p.o. daily.  5. Flomax 0.4 mg p.o. daily.  6. Lasix 40 mg p.o. daily x6 days.  7. KCl 20 mEq p.o. daily x6 days.  8. Lipitor 40 mg p.o. at bedtime.  9. Benicar 10 mg p.o. daily.  10.Nu-Iron 150 mg p.o. daily.  11.Lopressor 50 mg p.o. two times daily.  12.Amiodarone 400 mg p.o. daily x2 weeks, then 200 mg p.o. daily      thereafter.  13.Folic acid 1 mg p.o. daily.  14.Ultram 50 mg 1-2 tablets q.4-6 h. as needed for pain.  Please note,      the patient was not discharged home on Coumadin  because he is      maintaining a normal sinus rhythm.      Doree Fudge, PA      [place] [facility]. Cornelius Moras, M.D.  Electronically Signed    DZ/MEDQ  D:  09/16/2008  T:  09/17/2008  Job:  [id number]   cc:   Noralyn Pick. Eden Emms, MD, [facility]  Marca Ancona, MD  Neta Mends. Fabian Sharp, MD

## 2011-05-09 NOTE — Cardiovascular Report (Signed)
NAMEREILLY, BLADES NO.:  [id number]   MEDICAL RECORD NO.:  [id number]          PATIENT TYPE:  OIB   LOCATION:  1962                         FACILITY:  [facility]   PHYSICIAN:  Marca Ancona, MD      DATE OF BIRTH:  [birth year]   DATE OF PROCEDURE:  09/09/2008  DATE OF DISCHARGE:  09/09/2008                            CARDIAC CATHETERIZATION   INDICATIONS:  Stable angina with positive Myoview.   PROCEDURES:  1. Left heart catheterization.  2. Coronary angiography.  3. Left ventriculography.   PROCEDURE NOTE:  After informed consent was obtained, the patient was  sterilely prepped and draped.  Lidocaine 1% was used to locally  anesthetize the right groin area.  The right common femoral artery was  entered using Seldinger technique and a 4-French arterial sheath was  placed in the artery.  The left coronary artery was engaged with the 4-  [place] JL-4 catheter.  The right coronary artery was engaged with the 4-  [place] 3DRC catheter and the left ventricle was entered using the angled  pigtail catheter.   COMPLICATIONS:  There were no complications.   FINDINGS:  Hemodynamics:  1. LV 125/13/15, aorta 123/72.  2. Left ventriculogram.  EF was 55%.  There were no significant wall      motion abnormalities.  There was no gradient on pullback from the      left ventricle to the aorta.  3. Coronary angiography:  The coronary artery system is right      dominant.  There is a 90% proximal RCA stenosis and an 80% ostial      PDA stenosis.  The left main has a 30% distal lesion.  The LAD has      a hazy lesion proximally that could be up to 90% at the ostium of      the LAD.  The first diagonal has a branch that is totally occluded.      There is a probably 50-60% proximal circumflex stenosis.   ASSESSMENT AND PLAN:  There is severe proximal right coronary artery and  ostial posterior descending artery stenoses.  Additionally, there is a  defect in the proximal left  anterior descending that appears to be  consistent with significant ostial left anterior descending stenosis.  I  discussed this case with Interventional Cardiology, who also reviewed  the films and we think the best plan at this point will be bypass  surgery.  We will contact surgery and plan for coronary artery bypass  grafting.      Marca Ancona, MD  Electronically Signed    DM/MEDQ  D:  09/09/2008  T:  09/10/2008  Job:  [id number]   cc:   Noralyn Pick. Eden Emms, MD, [facility]

## 2011-05-09 NOTE — Discharge Summary (Signed)
NAMEBEAR, OSTEN NO.:  [id number]   MEDICAL RECORD NO.:  [id number]          PATIENT TYPE:  INP   LOCATION:  2015                         FACILITY:  [facility]   PHYSICIAN:  Salvatore Decent. Cornelius Moras, M.D. DATE OF BIRTH:  Jul 10, 1941   DATE OF ADMISSION:  09/09/2008  DATE OF DISCHARGE:  09/16/2008                               DISCHARGE SUMMARY   ADDENDUM   DISCHARGE MEDICATIONS:  It was stated he was discharged on Benicar 10 mg  p.o. daily.  This was an error.  He was informed he is not to take  Benicar until seen by his cardiologist.      Doree Fudge, PA      Salvatore Decent. Cornelius Moras, M.D.  Electronically Signed    DZ/MEDQ  D:  09/18/2008  T:  09/18/2008  Job:  [id number]

## 2011-05-09 NOTE — Assessment & Plan Note (Signed)
[facility]                            CARDIOLOGY OFFICE NOTE   NAME:Greenhalgh, REGINOLD                       MRN:          [id number]  DATE:08/24/2008                            DOB:          11/21/1941    [name] is a 70 year old patient followed by Dr. Fabian Sharp for  dyspnea, chest pain, history of rheumatic fever.   The patient had a bad episode about 6 weeks ago at a wedding, he had  severe exertional dyspnea and diaphoresis.  His chest got tight and  lasted for about 30 minutes.  He needed to stop in his tracks.  He had  never had anything quite this bad before.  He was a previous Brewing technologist and currently has had some occupational exposures in the  past.   However, he does not carry a diagnosis of COPD, asthma and he quit  smoking 45 years ago.   In general, he can get some tightness in his chest with exertion and it  is not clear whether this is an asthmatic component or angina.   There is no radiation to the shoulders.  No diaphoresis.  He has chronic  palpitations which have not changed, they tend to be flip-flop.  He has  had no sustained long heart beats and no sudden changes in his heart  beats.  There is a question of previous rheumatic fever.   The patient apparently has a previous history of an abnormal echo with a  leaky heart valve and a question of PFO.  We have no documentation of  this.   The patient's coronary risk factors include hypertension,  hyperlipidemia, and borderline hyperglycemia.   Family history is also positive with the father having premature  coronary artery disease.  He died post CABG from a clot.   The patient's review of systems otherwise negative.   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   His meds include baby  aspirin, Aciphex 20 a day, Singulair 10 a day,  Flomax 0.4 a day, metoprolol 50 mg half a tablet b.i.d., Benicar 20 a  day.   He also takes Crestor 5 mg a day.  He is allergic to DARVOCET.   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   The patient is happily married.  He has three daughters, 2 used to live  here, 1 still lives up in [place].  His wife's health is fine.  He does  not drink or smoke.  He enjoys golfing and coin collecting.   PHYSICAL EXAMINATION:  GENERAL:  A pleasant elderly white male with a  [place] accent.  VITAL SIGNS:  His blood pressure is equal to 138/85, pulse 70 and  regular, respiratory 14, afebrile.  Weight 206.  HEENT:  Unremarkable.  NECK:  Carotids are normal without bruit.  No lymphadenopathy,  thyromegaly, or JVP elevation.  LUNGS:  Clear with good  diaphragmatic motion.  No wheezing.  CARDIAC:  S1 and S2 with a soft systolic murmur.  PMI normal.  ABDOMEN:  Benign.  Bowel sounds positive.  No AAA.  No tenderness.  No  bruit.  No hepatosplenomegaly or hepatojugular reflux.  EXTREMITIES:  Distal pulses are intact.  No edema.  NEURO:  Nonfocal.  SKIN:  Warm and dry.  MUSCULOSKELETAL:  No muscular weakness.   EKG shows sinus rhythm with first-degree heart block with PR interval of  218, slightly poor R-wave progression, and flat ST segments in the  lateral leads.   IMPRESSION:  1. Dyspnea with particularly bad episode 6 weeks ago, check PFTs being      post bronchodilator.  Check 2-D echocardiogram given history of      rheumatic disease.  Check right ventricular and left ventricular      function.  2. Tightness in the chest with coronary risk factors, abnormal EKG.      Followup stress Myoview.  3. Soft murmur in the history of question of a leaky heart valve and a      patent foramen ovale.  We will check his 2-D echocardiogram.      Initially, we will just  check for patent foramen ovale with color      Doppler and not to use contrast.  4. Hypertension, currently well controlled.  Continue current dose of      ACE inhibitor and beta-blocker.  5. 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      Check PFTs being postbronchodilator.  6. Hyperlipidemia with multiple coronary risk factors.  Continue      Crestor.  Lipid and liver profile in 6 months.  7. History of reflux.  Continue Aciphex, low-spice diet.  Avoid late-      night meals.  8. History of prostatism.  Continue Flomax, urinary stream adequate.      Check PSA at least yearly.   Further recommendations will be based on the results of his echo stress  test and PFTs.     Noralyn Pick. Eden Emms, MD, [facility]  Electronically Signed   PCN/MedQ  DD: 08/24/2008  DT: 08/25/2008  Job #: [id number]

## 2011-05-09 NOTE — Assessment & Plan Note (Signed)
OFFICE VISIT   DODGE, ATOR  DOB:  January 25, 1941                                        September 21, 2008  CHART #:  [id number]   HISTORY OF PRESENT ILLNESS:  The patient returns to the office as an  unscheduled visit for followup, status post coronary artery bypass  grafting x5 on September 11, 2008.  He was discharged from the hospital  last week.  He called to say that he was concerned about some drainage  from his sternal incision.  He presents to the office today and overall,  he reports feeling quite well.  He denies any fevers or chills.  He  denies any shortness of breath.  He has only minimal residual soreness  in his chest.  His breathing is quite good and he is getting around  quite well without problem.  He states that over the weekend, he had a  couple of small spots of yellowish drainage inferiorly from his sternal  incision.  This was not enough to saturate his shirt and represented  perhaps 10-12 small droplets of drainage.  The remainder of his review  of systems is unrevealing.   PHYSICAL EXAMINATION:  GENERAL:  Notable for well-appearing male.  VITAL SIGNS:  Blood pressure 109/67, pulse 82, and oxygen saturation  95%.  He is afebrile.  CHEST:  Reveals a median sternotomy incision that is healing nicely.  Inferiorly, one can barely appreciate a small droplet of serous drainage  when palpating along the xiphoid process.  There is no surrounding  cellulitis.  There is no purulent drainage whatsoever.  Skin edges are  intact without necrosis.  Breath sounds are clear to auscultation.  CARDIOVASCULAR:  Demonstrates regular rate and rhythm.  ABDOMEN:  Soft and nontender.  EXTREMITIES:  The right lower extremity endoscopic vein harvest  incisions are healing well.  The remainder of his physical exam is unrevealing.   IMPRESSION:  The patient appears to be doing well.  He has a tiny amount  of drainage inferiorly from the sternal incision  that is probably not of  any significance.   PLAN:  We will give the patient a short course of oral Keflex in the  event this could potentially represent a very minor superficial wound  infection.  However, I am skeptical that there is a problem at all.  We  will see the patient back in 3 weeks as previously planned.   Salvatore Decent. Cornelius Moras, M.D.  Electronically Signed   CHO/MEDQ  D:  09/21/2008  T:  09/22/2008  Job:  [id number]

## 2011-05-09 NOTE — Op Note (Signed)
[name], Jon NO.:  [id number]   MEDICAL RECORD NO.:  [id number]          PATIENT TYPE:  INP   LOCATION:  2314                         FACILITY:  [facility]   PHYSICIAN:  Salvatore Decent. Cornelius Moras, M.D. DATE OF BIRTH:  [birth year]   DATE OF PROCEDURE:  09/11/2008  DATE OF DISCHARGE:                               OPERATIVE REPORT   PREOPERATIVE DIAGNOSIS:  Severe 3-vessel coronary artery disease.   POSTOPERATIVE DIAGNOSIS:  Severe 3-vessel coronary artery disease.   PROCEDURE:  Median sternotomy for coronary artery bypass grafting X5  (left internal mammary artery to distal left anterior descending  coronary artery, saphenous vein graft to first diagonal branch,  saphenous vein graft to second circumflex marginal branch, saphenous  vein graft to posterior descending coronary artery with sequential  saphenous vein graft to second right posterolateral branch, and  endoscopic saphenous vein harvest from right thigh and right lower leg).   SURGEON:  Salvatore Decent. Cornelius Moras, MD   ASSISTANT:  Doree Fudge, PA   ANESTHESIA:  General.   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  The  patient presents with symptoms of exertional shortness of breath and  chest discomfort.  Stress Myoview exam was abnormal prompting cardiac  catheterization.  This was performed on September 09, 2008, and findings  were notable for the presence of severe 3-vessel coronary artery disease  with normal left ventricular function.  A full consultation note has  been dictated previously.  The patient and his s family have been  counseled at length regarding the indications, risks, and potential  benefits of surgery.  Alternative treatment strategies have been  discussed.  They understand and accept all potential associated risks  and desire to proceed with surgery as described.   OPERATIVE FINDINGS:  1. Normal left ventricular systolic function.  2. Trivial mitral regurgitation.  3. Good-quality left internal mammary artery and saphenous vein      conduit for grafting.  4. Diffuse coronary artery disease with only fair target vessel for      grafting and poor target vessels for grafting including the      diagonal branch and the circumflex marginal branch.   OPERATIVE NOTE IN DETAIL:  The patient was brought to the operating room  on the above-mentioned date and central monitoring was established by  the Anesthesia Service under the care and direction of Dr. Laverle Hobby.  Specifically, a Swan-Ganz catheter was placed through the right  internal jugular approach.  A radial arterial line was placed.  Intravenous antibiotics were administered.  Following induction with  general endotracheal anesthesia, a Foley catheter was placed.  The  patient's chest, abdomen, both groins, and both lower extremities are  prepared and draped in the sterile manner.  Baseline transesophageal  echocardiogram was performed by Dr. Katrinka Blazing.  This demonstrated normal  left ventricular systolic function with trivial mitral regurgitation.  No other  abnormalities were noted.   A median sternotomy incision was performed and the left internal mammary  artery was dissected from the chest wall and prepared for bypass  grafting.  The left internal mammary artery was good-quality conduit.  Simultaneously, saphenous vein was obtained from the patient's right  thigh and the upper portion of the right lower leg using endoscopic vein  harvest technique.  The saphenous vein was good-quality conduit.  After  the saphenous vein had been removed, the small incisions in the right  lower extremity were closed in multiple layers with running absorbable  suture.  The patient was  heparinized systemically and the left internal  mammary artery was transected.  It was noted to have excellent flow.   The pericardium was opened.  The ascending aorta was normal in  appearance.  The ascending aorta and right atrium were cannulated for  cardiopulmonary bypass.  Cardiopulmonary bypass was begun and the  surface of heart was inspected.  Distal target vessels were selected for  coronary artery bypass grafting.  A temperature probe was placed in the  left ventricular septum and a cardioplegic catheter was placed in the  ascending aorta.  The patient was allowed to cool passively to 32  degrees systemic temperature.  The aortic crossclamp was applied and  cold blood cardioplegia was delivered in the antegrade fashion through  the aortic root.  Iced saline slush was applied for topical hypothermia.  The initial cardioplegic arrest and myocardial cooling was felt to be  excellent.  Repeat doses of cardioplegia were administered  intermittently throughout the crossclamp portion of the operation  through the aortic root and down the subsequently placed vein graft to  maintain left ventricular septal temperature below 15 degrees  centigrade.   The following distal coronary anastomoses were performed:  1. The posterior descending coronary artery was grafted with saphenous      vein graft in the side-to-side fashion.  This vessel was somewhat      small caliber measuring 1.3 mm in diameter, but otherwise was      fairly good quality target vessel for grafting.  2. The second posterolateral branch was grafted using a sequential      saphenous vein graft off the vein placed to the posterior      descending coronary artery.  This vessel measured 1.5 mm in      diameter and this was a good-quality target vessel for grafting.  3. The second circumflex marginal branch was grafted with a saphenous      vein graft in the end-to-side fashion.  This vessel measured 1.2 mm      in diameter.   It was diffusely diseased and was a very poor quality      target vessel for grafting.  It was chronically occluded      proximally.  4. The diagonal branch of the left anterior descending coronary artery      was grafted with saphenous vein graft in the end-to-side fashion.      This vessel measured 1.2 mm in diameter and was a very poor prep      quality target vessel for grafting.  5. The distal left anterior descending coronary artery was grafted      with left internal mammary artery in the end-to-side fashion.  This      vessel measured 1.6 mm in diameter and was a good-quality target      vessel at the site of distal grafting.   All  3 proximal saphenous vein anastomoses were performed directly to the  ascending aorta prior to removal of the aortic crossclamp.  The left  ventricular septal temperature rose rapidly with reperfusion of the left  internal mammary artery.  The aortic crossclamp was removed after a  total crossclamp time of 102 minutes.  All proximal and distal coronary  anastomoses were inspect for hemostasis and appropriate graft  orientation.  Epicardial pacing wires were fixed to the right  ventricular free wall and to the right atrial appendage.  Normal sinus  rhythm resumed spontaneously.  The patient weaned from cardiopulmonary  bypass without difficulty.  The patient's rhythm at separation from  bypass was normal sinus rhythm.  No inotropic support was required.  Total cardiopulmonary bypass time of the operation was 124 minutes.  Followup transesophageal echocardiogram performed by Dr. Katrinka Blazing after  separation from bypass demonstrated normal left ventricular function.  No other abnormalities were noted.   The venous and arterial cannulae were removed uneventfully.  Protamine  was administered to reverse anticoagulation.  The mediastinum and the  left chest were irrigated with saline solution-containing vancomycin.  Meticulous surgical hemostasis was  ascertained.  The mediastinum and  left chest were drained with 3 chest tubes, exited through separate stab  incisions inferiorly.  The pericardium and soft tissues anterior to the  aorta were reapproximated loosely.  The sternum was closed with double-  strength sternal wire.  The soft tissues anterior to the sternum were  closed in multiple layers and the skin was closed with a running  subcuticular skin closure.   The patient tolerated the procedure well and was transported to the  surgical intensive care unit in stable condition.  There were no  intraoperative complications.  All sponge, instrument, and needle counts  were verified as correct at the completion of the operation.  No blood  products were administered.      Salvatore Decent. Cornelius Moras, M.D.  Electronically Signed     CHO/MEDQ  D:  09/11/2008  T:  09/12/2008  Job:  [id number]   cc:   Noralyn Pick. Eden Emms, MD, [facility]  Veverly Fells. Excell Seltzer, MD

## 2011-05-09 NOTE — Assessment & Plan Note (Signed)
[facility]                            CARDIOLOGY OFFICE NOTE   NAME:[name], [name]                       MRN:          [id number]  DATE:11/08/2007                            DOB:          01/08/1941    PRIMARY CARE PHYSICIAN:  Neta Mends. Panosh, MD.   REASON FOR PRESENTATION:  Evaluate patient with an abnormal echo and  palpitations.   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  He says he is not sure whether this was  rheumatic heart disease or polio.  He has never had any residual other  than he was told he had a slightly leaky mitral valve some years ago on  an echocardiogram.  He has had stress tests in the past.  The last one  was probably a little longer than five years ago.  Apparently these have  been normal.  He did, because of his questionable rheumatic history and  mitral valve history, have an echo ordered by Dr. Fabian Sharp.  This  demonstrated a questionable patent foramen ovale with a slightly  aneurysmal septum, but no significant valvular abnormalities.  He had a  well-preserved ejection fraction.   He presents for evaluation of this.   He does get around and does things at his daughter's house such as leaf  blowing.  With this activity, he does not bring on any chest discomfort,  neck or arm discomfort.  He does not have any activity such as nausea,  vomiting, or diaphoresis.  He is somewhat limited by cervical disk  disease.  He does occasionally get palpitations.  He cannot bring these  on.  These seem to be isolated skipped beats.  They were worse some time  ago and are better now.  He has not had any presyncope or syncope  associated with these.  He has had no shortness of breath and denies any  PND or orthopnea.   PAST MEDICAL HISTORY:  1. Hypertension x4 years well controlled.  2. Gastroesophageal reflux  disease.  3. Nasal polyps.  4. 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   ALLERGIES:  CODEINE.  He has also been INTOLERANT OF ALL THE  CHOLESTEROL DRUGS.   CURRENT MEDICATIONS:  1. Aspirin 81 mg daily.  2. Singular 10 mg daily.  3. Aciphex 20 mg b.i.d.  4. Benicar 20 mg daily.  5. Flomax 0.4 mg daily.  6. Saw Palmetto, fish oil, multivitamin, zinc, Coenzyme-Q.   SOCIAL HISTORY:  The patient is retired.  He is married.  He has three  children.  He was a Company secretary.   FAMILY HISTORY:  Noncontributory for early coronary artery disease  though his father had heart disease in his 47s.   REVIEW OF SYSTEMS:  As stated in the HPI and positive for dizziness,  occasional palpitations, and hypertension, reflux, colitis, decreased  urinary stream, neck discomfort.  Negative for other systems.   PHYSICAL EXAMINATION:  GENERAL:  The patient is in no distress.  VITAL SIGNS:  Blood pressure 135/77, heart rate 79 and regular, weight  210 pounds, body mass index 30.  HEENT:  Eyelids unremarkable, pupils equal, round, and react to light,  fundi not visualized, oral mucosa unremarkable.  NECK:  No jugulovenous distention at 45 degrees, carotid upstroke brisk  and symmetric, no bruits, no thyromegaly.  LYMPHATICS:  No cervical, axillary, or inguinal adenopathy.  LUNGS:  Clear to auscultation bilaterally.  BACK:  No costovertebral angle tenderness.  CHEST:  Unremarkable.  HEART:  The PMI not displaced or sustained, S1 and S2 within normal  limits, no S3, no S4, no clicks, no rubs, no murmurs.  ABDOMEN:  Obese, positive bowel sounds normal in frequency and pitch, no  bruits, no rebound, no guarding, no midline pulsatile mass, no  hepatomegaly, no splenomegaly.  SKIN:  No rashes, no nodules.  EXTREMITIES:  2+ pulses, no edema.   EKG:  Sinus rhythm, rate 79, axis within normal limits, intervals within  normal limits, no acute STT wave changes.   ASSESSMENT AND PLAN:  1. 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  The patient has a mild      abnormality on his echocardiogram and no symptoms.  At this point,      there is no suggestion of a shunt.  There will be no change in his      therapy.  No further cardiovascular testing is suggested.  2. Palpitations.  These are infrequent.  No further cardiovascular      testing is suggested.  3. Primary risk reduction.  He has had a stress test in the last five      years or so, and no further testing is indicated.  He will continue      with primary risk reduction.  4. Hypertension.  Blood pressure is well-controlled, and he will      continue with the medications as listed.  5. Dyslipidemia.  Unfortunately, he has NOT TOLERATED LIPID LOWERING      DRUGS.  He is      going to continue with the fish oil.  6. Followup.  I will see him back as needed.     Rollene Rotunda, MD, [facility]  Electronically Signed    JH/MedQ  DD: 11/08/2007  DT: 11/09/2007  Job #: [id number]   cc:   Neta Mends. Fabian Sharp, MD

## 2011-05-13 ENCOUNTER — Encounter: Payer: Self-pay | Admitting: Cardiovascular Disease

## 2011-05-23 ENCOUNTER — Other Ambulatory Visit: Payer: Self-pay | Admitting: Internal Medicine

## 2011-05-23 MED ORDER — MONTELUKAST SODIUM 10 MG PO TABS: 10.0000 mg | ORAL_TABLET | Freq: Every day | ORAL | Status: DC

## 2011-05-23 MED ORDER — METOPROLOL TARTRATE 50 MG PO TABS: 50.0000 mg | ORAL_TABLET | Freq: Two times a day (BID) | ORAL | Status: DC

## 2011-05-23 MED ORDER — RABEPRAZOLE SODIUM 20 MG PO TBEC: 20.0000 mg | DELAYED_RELEASE_TABLET | Freq: Every day | ORAL | Status: DC

## 2011-05-23 NOTE — Telephone Encounter (Signed)
Pt req written scripts for Aciphex, Singulair,Metoprolol. Pls call when ready.

## 2011-05-23 NOTE — Telephone Encounter (Signed)
Rx's [facility]. Left message for pt's wife about this.

## 2011-05-24 ENCOUNTER — Telehealth: Payer: Self-pay | Admitting: *Deleted

## 2011-05-24 MED ORDER — METOPROLOL TARTRATE 50 MG PO TABS: 50.0000 mg | ORAL_TABLET | Freq: Two times a day (BID) | ORAL | Status: DC

## 2011-05-24 NOTE — Telephone Encounter (Signed)
[facility] Wife wants rx to be printed out and she will pick it up today.

## 2011-05-26 ENCOUNTER — Encounter: Payer: Self-pay | Admitting: Cardiovascular Disease

## 2011-05-26 ENCOUNTER — Ambulatory Visit (INDEPENDENT_AMBULATORY_CARE_PROVIDER_SITE_OTHER): Payer: Medicare Other | Admitting: Cardiovascular Disease

## 2011-05-26 ENCOUNTER — Ambulatory Visit: Payer: Medicare Other | Admitting: Cardiovascular Disease

## 2011-05-26 VITALS — BP 128/72 | HR 74 | Resp 18 | Ht 69.0 in | Wt 206.8 lb

## 2011-05-26 DIAGNOSIS — I2510 Atherosclerotic heart disease of native coronary artery without angina pectoris: Secondary | ICD-10-CM

## 2011-05-26 DIAGNOSIS — N400 Enlarged prostate without lower urinary tract symptoms: Secondary | ICD-10-CM

## 2011-05-26 DIAGNOSIS — E785 Hyperlipidemia, unspecified: Secondary | ICD-10-CM

## 2011-05-26 DIAGNOSIS — I10 Essential (primary) hypertension: Secondary | ICD-10-CM

## 2011-05-26 NOTE — Assessment & Plan Note (Signed)
Well controlled.  Continue current medications and low sodium Dash type diet.    

## 2011-05-26 NOTE — Assessment & Plan Note (Signed)
Cholesterol is at goal.  Continue current dose of statin and diet Rx.  No myalgias or side effects.  F/U  LFT's in 6 months. Lab Results  Component Value Date   LDLCALC 81 02/08/2010

## 2011-05-26 NOTE — Patient Instructions (Signed)
Your physician recommends that you schedule a follow-up appointment in: YEAR WITH DR NISHAN  Your physician recommends that you continue on your current medications as directed. Please refer to the Current Medication list given to you today. 

## 2011-05-26 NOTE — Assessment & Plan Note (Signed)
Stable with no angina and good activity level.  Continue medical Rx  

## 2011-05-26 NOTE — Assessment & Plan Note (Signed)
F/U Dr Annabell Howells  Encouraged him to get a PSA when he returns from [place]

## 2011-05-26 NOTE — Progress Notes (Signed)
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 He has leg cramps with most statins. He is tolerating lipitor qod with an LDL of 82 He has prostate issues and flomax is working fairly well.He has had recurrent hernias in the past and is having recurrent issues on the right. I gave hiim Dr Maris Berger name as a referral. Otherwise he has been compliant with his meds and walks on a daily basis. He had some palpitations and Event monitor was benign.  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 He will be going to [place] soon and is an avid Public librarian.   ROS: Denies fever, malais, weight loss, blurry vision, decreased visual acuity, cough, sputum, SOB, hemoptysis, pleuritic pain, palpitaitons, heartburn, abdominal pain, melena, lower extremity edema, claudication, or rash.  All other systems reviewed and negative  General: Affect appropriate Healthy:  appears stated age HEENT: normal Neck supple with no adenopathy JVP normal no bruits no thyromegaly Lungs clear with no wheezing and good diaphragmatic motion Heart:  S1/S2 no murmur,rub, gallop or click PMI normal Abdomen: benighn, BS positve, no tenderness, no AAA no bruit.  No HSM or HJR Distal pulses intact with no bruits No edema Neuro non-focal Skin warm and dry No muscular weakness   Current Outpatient Prescriptions  Medication Sig Dispense Refill  . aspirin 81 MG tablet Take 81 mg by mouth daily.        Marland Kitchen atorvastatin (LIPITOR) 40 MG tablet Take 40 mg by mouth daily. 1 every other day      . cetirizine (ZYRTEC) 10 MG tablet Take 10 mg by mouth daily.        Marland Kitchen co-enzyme Q-10 30 MG capsule Take 30 mg by mouth daily.       . metoprolol (LOPRESSOR) 50 MG tablet Take 1 tablet (50 mg total) by mouth 2 (two) times daily. Take 1/2 bid   180 tablet  2  . montelukast (SINGULAIR) 10 MG tablet Take 1 tablet (10 mg  total) by mouth at bedtime.  90 tablet  2  . MULTIPLE VITAMIN PO Take by mouth.        . NON FORMULARY Mag, zinc and calcium       . RABEprazole (ACIPHEX) 20 MG tablet Take 1 tablet (20 mg total) by mouth daily.  90 tablet  2  . Tamsulosin HCl (FLOMAX) 0.4 MG CAPS Take 0.4 mg by mouth daily.       Marland Kitchen DISCONTD: clobetasol (TEMOVATE) 0.05 % cream Apply topically 2 (two) times daily.          Allergies  Propoxyphene n-acetaminophen  Electrocardiogram:  NSR 75 PR 210 otherwise normal ECG  Assessment and Plan

## 2011-07-21 ENCOUNTER — Ambulatory Visit (INDEPENDENT_AMBULATORY_CARE_PROVIDER_SITE_OTHER): Payer: Medicare Other | Admitting: Internal Medicine

## 2011-07-21 ENCOUNTER — Encounter: Payer: Self-pay | Admitting: Internal Medicine

## 2011-07-21 VITALS — BP 130/90 | HR 68 | Temp 98.6°F | Wt 204.0 lb

## 2011-07-21 DIAGNOSIS — M79609 Pain in unspecified limb: Secondary | ICD-10-CM

## 2011-07-21 DIAGNOSIS — M79672 Pain in left foot: Secondary | ICD-10-CM

## 2011-07-21 NOTE — Patient Instructions (Addendum)
This could be a tendinitis   But   Concerned  About other   Processes. Get appt with   Orthopedics and call us with appt so we can send copy of notes.  And x ray reports.

## 2011-07-22 DIAGNOSIS — M79672 Pain in left foot: Secondary | ICD-10-CM | POA: Insufficient documentation

## 2011-07-22 NOTE — Progress Notes (Signed)
  Subjective:    Patient ID: [name], male    DOB: 11/14/1941, 70 y.o.   MRN: [id number]  HPI Comes in today with wife because of continue problem let foot .  See last visit .  Has used a ankle brace .  Got some better and then went to [place] did a lot of walking . Pain  On top of foot and lateral.    Gets better after up an around awhile. No new injury.  Took ibuprofen a couple times and using elastic support .  No other rx.   Review of Systems NO fever sob.  Rash under the wrap  Has cortisone cream to use.from dermatology Past history family history social history reviewed in the electronic medical record.     Objective:   Physical Exam WDWN in nad Left le  :   Minimal swelling at ankle and neg squeexz test.  No redness ok rom tender over extensor tendons and pain with dorsiflexion active more than passive.  No point tenderness.  Pulse present nl cap refill   SKIN: post le red rash no vesicle. Reviewed past x rays    Assessment & Plan:  Left foot pain  Prev exam more consistent with ankle swelling but now acts like foot tendinitis however because of  Context    And persistence rec ortho consult.

## 2011-08-02 ENCOUNTER — Other Ambulatory Visit: Payer: Self-pay | Admitting: *Deleted

## 2011-08-02 MED ORDER — ATORVASTATIN CALCIUM 40 MG PO TABS: 40.0000 mg | ORAL_TABLET | Freq: Every day | ORAL | Status: DC

## 2011-08-03 ENCOUNTER — Telehealth: Payer: Self-pay | Admitting: Gastroenterology

## 2011-08-03 NOTE — Telephone Encounter (Signed)
Numbers in the system are out of order. No additional contact numbers. Noted to mail a letter to the patient.

## 2011-08-04 ENCOUNTER — Telehealth: Payer: Self-pay | Admitting: Cardiovascular Disease

## 2011-08-04 MED ORDER — ATORVASTATIN CALCIUM 40 MG PO TABS: ORAL_TABLET | ORAL | Status: DC

## 2011-08-04 NOTE — Telephone Encounter (Signed)
Pt needs refill on [phone] and they need to verify directions

## 2011-09-25 LAB — GLUCOSE, CAPILLARY
Glucose-Capillary: 104 — ABNORMAL HIGH
Glucose-Capillary: 107 — ABNORMAL HIGH
Glucose-Capillary: 110 — ABNORMAL HIGH
Glucose-Capillary: 113 — ABNORMAL HIGH
Glucose-Capillary: 123 — ABNORMAL HIGH
Glucose-Capillary: 125 — ABNORMAL HIGH
Glucose-Capillary: 126 — ABNORMAL HIGH
Glucose-Capillary: 128 — ABNORMAL HIGH
Glucose-Capillary: 130 — ABNORMAL HIGH
Glucose-Capillary: 133 — ABNORMAL HIGH
Glucose-Capillary: 135 — ABNORMAL HIGH
Glucose-Capillary: 135 — ABNORMAL HIGH
Glucose-Capillary: 143 — ABNORMAL HIGH
Glucose-Capillary: 163 — ABNORMAL HIGH
Glucose-Capillary: 166 — ABNORMAL HIGH
Glucose-Capillary: 86

## 2011-09-25 LAB — POCT I-STAT 4, (NA,K, GLUC, HGB,HCT)
Glucose, Bld: 118 — ABNORMAL HIGH
Glucose, Bld: 118 — ABNORMAL HIGH
Glucose, Bld: 126 — ABNORMAL HIGH
HCT: 26 — ABNORMAL LOW
HCT: 26 — ABNORMAL LOW
HCT: 33 — ABNORMAL LOW
HCT: 49
Hemoglobin: 11.2 — ABNORMAL LOW
Hemoglobin: 16.7
Hemoglobin: 8.8 — ABNORMAL LOW
Potassium: 3.7
Potassium: 3.7
Potassium: 4.7
Sodium: 137
Sodium: 137
Sodium: 141

## 2011-09-25 LAB — COMPREHENSIVE METABOLIC PANEL
ALT: 31
BUN: 10
CO2: 25
Calcium: 9
GFR calc non Af Amer: >60
Glucose, Bld: 106 — ABNORMAL HIGH
Sodium: 139

## 2011-09-25 LAB — CBC
HCT: 23.7 — ABNORMAL LOW
HCT: 24.9 — ABNORMAL LOW
HCT: 24.9 — ABNORMAL LOW
HCT: 25.4 — ABNORMAL LOW
HCT: 27.6 — ABNORMAL LOW
HCT: 44.7
Hemoglobin: 10.5 — ABNORMAL LOW
Hemoglobin: 15.4
Hemoglobin: 8.1 — ABNORMAL LOW
Hemoglobin: 8.5 — ABNORMAL LOW
Hemoglobin: 8.6 — ABNORMAL LOW
Hemoglobin: 8.7 — ABNORMAL LOW
Hemoglobin: 9.4 — ABNORMAL LOW
MCHC: 33.7
MCHC: 34.1
MCHC: 34.1
MCHC: 34.3
MCHC: 34.5
MCHC: 34.7
MCHC: 35.2
MCV: 94.5
MCV: 95
MCV: 96.6
MCV: 96.9
MCV: 96.9
MCV: 97.3
MCV: 97.6
Platelets: 115 — ABNORMAL LOW
Platelets: 116 — ABNORMAL LOW
Platelets: 118 — ABNORMAL LOW
Platelets: 89 — ABNORMAL LOW
Platelets: 93 — ABNORMAL LOW
RBC: 2.43 — ABNORMAL LOW
RBC: 2.56 — ABNORMAL LOW
RBC: 2.57 — ABNORMAL LOW
RBC: 2.63 — ABNORMAL LOW
RBC: 3.22 — ABNORMAL LOW
RBC: 4.43
RDW: 13.1
RDW: 13.6
RDW: 13.6
RDW: 13.7
RDW: 14.1
WBC: 11.5 — ABNORMAL HIGH
WBC: 8.9
WBC: 9.1
WBC: 9.6

## 2011-09-25 LAB — URINALYSIS, ROUTINE W REFLEX MICROSCOPIC
Bilirubin Urine: NEGATIVE
Glucose, UA: NEGATIVE
Hgb urine dipstick: NEGATIVE
Ketones, ur: NEGATIVE
Nitrite: NEGATIVE
Protein, ur: NEGATIVE
Specific Gravity, Urine: 1.02
Urobilinogen, UA: 0.2
pH: 5

## 2011-09-25 LAB — BASIC METABOLIC PANEL
BUN: 11
BUN: 17
BUN: 17
CO2: 24
CO2: 25
CO2: 27
CO2: 27
CO2: 29
Calcium: 7.5 — ABNORMAL LOW
Calcium: 7.6 — ABNORMAL LOW
Calcium: 7.7 — ABNORMAL LOW
Chloride: 100
Chloride: 102
Chloride: 105
Chloride: 107
Chloride: 109
Creatinine, Ser: 0.69
Creatinine, Ser: 0.71
Creatinine, Ser: 0.74
GFR calc Af Amer: >60
GFR calc Af Amer: >60
GFR calc Af Amer: >60
GFR calc Af Amer: >60
GFR calc non Af Amer: >60
GFR calc non Af Amer: >60
GFR calc non Af Amer: >60
Glucose, Bld: 101 — ABNORMAL HIGH
Glucose, Bld: 113 — ABNORMAL HIGH
Glucose, Bld: 114 — ABNORMAL HIGH
Glucose, Bld: 138 — ABNORMAL HIGH
Glucose, Bld: 145 — ABNORMAL HIGH
Potassium: 3.5
Potassium: 3.5
Potassium: 3.7
Potassium: 3.8
Sodium: 133 — ABNORMAL LOW
Sodium: 135
Sodium: 136
Sodium: 137
Sodium: 139

## 2011-09-25 LAB — POCT I-STAT 3, ART BLOOD GAS (G3+)
Bicarbonate: 24.7 — ABNORMAL HIGH
Bicarbonate: 24.9 — ABNORMAL HIGH
Bicarbonate: 25.2 — ABNORMAL HIGH
O2 Saturation: 100
O2 Saturation: 97
O2 Saturation: 98
Patient temperature: 36.3
Patient temperature: 37.6
TCO2: 26
TCO2: 26
TCO2: 26
pCO2 arterial: 40.5
pCO2 arterial: 41.1
pCO2 arterial: 42.1
pH, Arterial: 7.38
pH, Arterial: 7.39
pH, Arterial: 7.398
pO2, Arterial: 111 — ABNORMAL HIGH
pO2, Arterial: 290 — ABNORMAL HIGH
pO2, Arterial: 88

## 2011-09-25 LAB — HEMOGLOBIN A1C
Hgb A1c MFr Bld: 5.9
Mean Plasma Glucose: 123

## 2011-09-25 LAB — CREATININE, SERUM
Creatinine, Ser: 0.71
GFR calc Af Amer: >60
GFR calc non Af Amer: >60
GFR calc non Af Amer: >60

## 2011-09-25 LAB — PROTIME-INR
INR: 1.2
INR: 1.5
Prothrombin Time: 18.6 — ABNORMAL HIGH

## 2011-09-25 LAB — APTT
aPTT: 118 — ABNORMAL HIGH
aPTT: 38 — ABNORMAL HIGH

## 2011-09-25 LAB — POCT I-STAT, CHEM 8
BUN: 13
BUN: 9
Calcium, Ion: 1.19
Calcium, Ion: 1.21
Chloride: 105
Chloride: 107
Creatinine, Ser: 0.8
HCT: 24 — ABNORMAL LOW
Potassium: 3.8
TCO2: 21

## 2011-09-25 LAB — BLOOD GAS, ARTERIAL
Acid-Base Excess: 1.4
Drawn by: 129711
O2 Saturation: 95.5
Patient temperature: 98.6
TCO2: 27.1

## 2011-09-25 LAB — TYPE AND SCREEN: ABO/RH(D): O NEG

## 2011-09-25 LAB — POCT I-STAT GLUCOSE
Glucose, Bld: 122 — ABNORMAL HIGH
Operator id: 3406

## 2011-09-25 LAB — PLATELET COUNT: Platelets: 184

## 2011-09-25 LAB — LIPID PANEL
Cholesterol: 204 — ABNORMAL HIGH
HDL: 31 — ABNORMAL LOW
LDL Cholesterol: 157 — ABNORMAL HIGH
Triglycerides: 79

## 2011-09-25 LAB — MAGNESIUM
Magnesium: 2.1
Magnesium: 2.4

## 2011-09-25 LAB — HEMOGLOBIN AND HEMATOCRIT, BLOOD: Hemoglobin: 9.4 — ABNORMAL LOW

## 2011-09-25 LAB — HEPARIN LEVEL (UNFRACTIONATED): Heparin Unfractionated: 0.5

## 2011-09-25 LAB — ABO/RH: ABO/RH(D): O NEG

## 2011-10-11 ENCOUNTER — Telehealth: Payer: Self-pay | Admitting: Internal Medicine

## 2011-10-11 MED ORDER — TAMSULOSIN HCL 0.4 MG PO CAPS: 0.4000 mg | ORAL_CAPSULE | Freq: Every day | ORAL | Status: DC

## 2011-10-11 NOTE — Telephone Encounter (Signed)
Pts wife called and said that pt needs to pick up a written script for Tamsulosin HCl (FLOMAX) 0.4 MG CAPS on Friday 10/13/11, around 9am when pt comes in for flu shot. Pt is leaving to go out of state this weekend, so they have to pick up written script.

## 2011-10-13 ENCOUNTER — Ambulatory Visit (INDEPENDENT_AMBULATORY_CARE_PROVIDER_SITE_OTHER): Payer: Medicare Other | Admitting: Internal Medicine

## 2011-10-13 ENCOUNTER — Ambulatory Visit: Payer: Medicare Other

## 2011-10-13 DIAGNOSIS — Z23 Encounter for immunization: Secondary | ICD-10-CM

## 2011-11-28 ENCOUNTER — Encounter: Payer: Self-pay | Admitting: Internal Medicine

## 2011-11-28 ENCOUNTER — Ambulatory Visit (INDEPENDENT_AMBULATORY_CARE_PROVIDER_SITE_OTHER): Payer: Medicare Other | Admitting: Internal Medicine

## 2011-11-28 VITALS — BP 120/80 | HR 78 | Wt 204.0 lb

## 2011-11-28 DIAGNOSIS — I10 Essential (primary) hypertension: Secondary | ICD-10-CM

## 2011-11-28 DIAGNOSIS — K219 Gastro-esophageal reflux disease without esophagitis: Secondary | ICD-10-CM

## 2011-11-28 DIAGNOSIS — R7309 Other abnormal glucose: Secondary | ICD-10-CM

## 2011-11-28 DIAGNOSIS — E785 Hyperlipidemia, unspecified: Secondary | ICD-10-CM

## 2011-11-28 DIAGNOSIS — J309 Allergic rhinitis, unspecified: Secondary | ICD-10-CM

## 2011-11-28 DIAGNOSIS — I2510 Atherosclerotic heart disease of native coronary artery without angina pectoris: Secondary | ICD-10-CM

## 2011-11-28 LAB — LIPID PANEL
Cholesterol: 138 mg/dL (ref 0–200)
HDL: 42.3 mg/dL (ref >39.00)
Triglycerides: 102 mg/dL (ref 0.0–149.0)
VLDL: 20.4 mg/dL (ref 0.0–40.0)

## 2011-11-28 LAB — HEMOGLOBIN A1C: Hgb A1c MFr Bld: 5.7 % (ref 4.6–6.5)

## 2011-11-28 LAB — TSH: TSH: 1.22 u[IU]/mL (ref 0.35–5.50)

## 2011-11-28 LAB — HEPATIC FUNCTION PANEL
ALT: 24 U/L (ref 0–53)
Albumin: 4.1 g/dL (ref 3.5–5.2)
Bilirubin, Direct: 0.1 mg/dL (ref 0.0–0.3)
Total Protein: 6.7 g/dL (ref 6.0–8.3)

## 2011-11-28 NOTE — Progress Notes (Signed)
  Subjective:    Patient ID: [name], male    DOB: 10-Jul-1941, 70 y.o.   MRN: [id number]  HPI Patient comes in today for follow up of  multiple medical problems.   CAD: no cp sob  Sees cards about every 6 months LIPIDS: no se of meds due  On generic  atorva  No  Se of meds. MS left shoulder sore at times better with advil prn NO bleeding GERD best on aciphex  Other dont work as well.  To travel to [place]  To visit daughter soon.   Review of Systems NO fver co sob has chronic nasal congestion  No bleeding falling sob  Check itchy skin area right arm and left neck with tag     Objective:   Physical Exam WDWN in nad HEENT at eyes clear Neck: without adenopathy or masses or bruits Chest:  Clear to A&P without wheezes rales or rhonchi CV:  S1-S2 no gallops or murmurs peripheral perfusion is normal Abdomen:  Sof,t normal bowel sounds without hepatosplenomegaly, no guarding rebound or masses no CVA tenderness Skin: right arm small excoriation and Bodfish thickening  About 1 cm left neck  Tag fibroma  Reviewed labs   Recent       Assessment & Plan:  Hyperlipidema  Due for labs no se of meds CAD:  No new sx or change in status. MS lft shoulder  Good rom now GERD: on meds  No change Chronic  Congestion poss allergic Skin areas tag and other area with induration? Cause  Have him seen derm

## 2011-11-28 NOTE — Patient Instructions (Signed)
No change in meds at this time  Continue lifestyle intervention healthy eating and exercise . Have dermatology check the skin area .  return office visit depending on how you are doing  visit in 6 months.

## 2011-11-29 ENCOUNTER — Encounter: Payer: Self-pay | Admitting: *Deleted

## 2012-01-01 ENCOUNTER — Telehealth: Payer: Self-pay | Admitting: Cardiovascular Disease

## 2012-01-01 NOTE — Telephone Encounter (Signed)
New Msg: pt wife calling stating that pt is having occasional chest pains and wanted to see Dr. Eden Emms to discuss further. Pt scheduled appt with Dr. Eden Emms on 1/22; however pt wife wanted to know if pt needs to be seen sooner. Please return pt call to discuss further.

## 2012-01-03 ENCOUNTER — Telehealth: Payer: Self-pay | Admitting: Cardiovascular Disease

## 2012-01-03 MED ORDER — NITROGLYCERIN 0.4 MG SL SUBL: 0.4000 mg | SUBLINGUAL_TABLET | SUBLINGUAL | Status: DC | PRN

## 2012-01-03 NOTE — Telephone Encounter (Signed)
SPOKE WITH PT'S WIFE  HAS NOT HAD  ANY CHEST PAIN SINCE  SAT  PAIN IS DIFFER THAN PREVIOUS CABG PAIN AFTER LONG DISCUSSION WILL KEEP APPT WITH DR Eden Emms AS SCHEDULED   INSTRUCTED  IF S/S INCREASE   TO GO TO ER  FOR EVAL AND TX  NTG   SENT  VIA EPIC TO PHARMACY  AND INSTRUCTIONS  GIVEN ON USAGE./CY

## 2012-01-03 NOTE — Telephone Encounter (Signed)
SEE OTHER PHONE NOTE./CY 

## 2012-01-03 NOTE — Telephone Encounter (Signed)
Follow-up: ° ° ° °Patient returned your phone call. °

## 2012-01-16 ENCOUNTER — Ambulatory Visit (INDEPENDENT_AMBULATORY_CARE_PROVIDER_SITE_OTHER): Payer: Medicare Other | Admitting: Cardiovascular Disease

## 2012-01-16 ENCOUNTER — Encounter: Payer: Self-pay | Admitting: Cardiovascular Disease

## 2012-01-16 DIAGNOSIS — I209 Angina pectoris, unspecified: Secondary | ICD-10-CM | POA: Diagnosis not present

## 2012-01-16 DIAGNOSIS — K469 Unspecified abdominal hernia without obstruction or gangrene: Secondary | ICD-10-CM

## 2012-01-16 DIAGNOSIS — I10 Essential (primary) hypertension: Secondary | ICD-10-CM | POA: Diagnosis not present

## 2012-01-16 DIAGNOSIS — N400 Enlarged prostate without lower urinary tract symptoms: Secondary | ICD-10-CM

## 2012-01-16 DIAGNOSIS — I2510 Atherosclerotic heart disease of native coronary artery without angina pectoris: Secondary | ICD-10-CM | POA: Diagnosis not present

## 2012-01-16 NOTE — Patient Instructions (Addendum)
Your physician wants you to follow-up in: 6 months. You will receive a reminder letter in the mail two months in advance. If you don't receive a letter, please call our office to schedule the follow-up appointment.   Your physician has requested that you have en exercise stress myoview. For further information please visit [email or web address]. Please follow instruction sheet, as given.    

## 2012-01-16 NOTE — Assessment & Plan Note (Signed)
STable reducable  Multiple previous surgeries.  Has Dr Arne Cleveland name for reference

## 2012-01-16 NOTE — Assessment & Plan Note (Signed)
?  Angina  3 years post CABG  Continue ASA and beta blocker  Myovue  May need Lexiscan and low level exercise but will try to walk Has nitro

## 2012-01-16 NOTE — Assessment & Plan Note (Signed)
Still with frequency  Encouraged to F/U with Dr Isabel Caprice

## 2012-01-16 NOTE — Assessment & Plan Note (Signed)
Cholesterol is at goal.  Continue current dose of statin and diet Rx.  No myalgias or side effects.  F/U  LFT's in 6 months. Lab Results  Component Value Date   LDLCALC 75 11/28/2011

## 2012-01-16 NOTE — Assessment & Plan Note (Signed)
Well controlled.  Continue current medications and low sodium Dash type diet.    

## 2012-01-16 NOTE — Progress Notes (Signed)
[name] is seen today post CABG in 2009.l He has not had any  palpitaitons or dyspnea. He has leg cramps with most statins. He is tolerating lipitor qod with an LDL of 82 He has prostate issues and flomax is working fairly well.He has had recurrent hernias in the past and is having recurrent issues on the right. I gave hiim Dr Maris Berger name as a referral. Otherwise he has been compliant with his meds and walks on a daily basis. He had some palpitations and Event monitor was benign.   Had some chest pain first part of January One episode after eating ? Indigestion lasted less than a minute.  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   ROS: Denies fever, malais, weight loss, blurry vision, decreased visual acuity, cough, sputum, SOB, hemoptysis, pleuritic pain, palpitaitons, heartburn, abdominal pain, melena, lower extremity edema, claudication, or rash.  All other systems reviewed and negative  CABG 9/18/9  Cornelius Moras PROCEDURE: Median sternotomy for coronary artery bypass grafting X5  (left internal mammary artery to distal left anterior descending  coronary artery, saphenous vein graft to first diagonal branch,  saphenous vein graft to second circumflex marginal branch, saphenous  vein graft to posterior descending coronary artery with sequential  saphenous vein graft to second right posterolateral branch, and  endoscopic saphenous vein harvest from right thigh and right lower leg).   General: Affect appropriate Healthy:  appears stated age HEENT: normal Neck supple with no adenopathy JVP normal no bruits no thyromegaly Lungs clear with no wheezing and good diaphragmatic motion Heart:  S1/S2 no murmur,rub, gallop or click PMI normal Abdomen: benighn, BS positve, no tenderness, no AAA no bruit.  No HSM or HJR Distal pulses intact with  no bruits No edema Neuro non-focal Skin warm and dry No muscular weakness   Current Outpatient Prescriptions  Medication Sig Dispense Refill  . aspirin 81 MG tablet Take 81 mg by mouth daily.        Marland Kitchen atorvastatin (LIPITOR) 40 MG tablet 1 every other day  45 tablet  6  . co-enzyme Q-10 30 MG capsule Take 30 mg by mouth daily.       . metoprolol (LOPRESSOR) 50 MG tablet Take 1/2 bid      . MULTIPLE VITAMIN PO Take by mouth.        . nitroGLYCERIN (NITROSTAT) 0.4 MG SL tablet Place 1 tablet (0.4 mg total) under the tongue every 5 (five) minutes as needed for chest pain.  25 tablet  4  . RABEprazole (ACIPHEX) 20 MG tablet Take 1 tablet (20 mg total) by mouth daily.  90 tablet  2  . Tamsulosin HCl (FLOMAX) 0.4 MG CAPS Take 1 capsule (0.4 mg total) by mouth daily.  90 capsule  1    Allergies  Propoxyphene n-acetaminophen  Electrocardiogram:  SR 62 first degree other wise normal 6/12  Today NSR rate 71 PR 226 stable no acute ischemic chages  Assessment and Plan

## 2012-01-23 ENCOUNTER — Ambulatory Visit (HOSPITAL_COMMUNITY): Payer: Medicare Other | Attending: Cardiology | Admitting: Radiology

## 2012-01-23 DIAGNOSIS — R079 Chest pain, unspecified: Secondary | ICD-10-CM | POA: Diagnosis not present

## 2012-01-23 DIAGNOSIS — I10 Essential (primary) hypertension: Secondary | ICD-10-CM | POA: Diagnosis not present

## 2012-01-23 DIAGNOSIS — R0609 Other forms of dyspnea: Secondary | ICD-10-CM | POA: Insufficient documentation

## 2012-01-23 DIAGNOSIS — R579 Shock, unspecified: Secondary | ICD-10-CM

## 2012-01-23 DIAGNOSIS — R0602 Shortness of breath: Secondary | ICD-10-CM

## 2012-01-23 DIAGNOSIS — E785 Hyperlipidemia, unspecified: Secondary | ICD-10-CM | POA: Insufficient documentation

## 2012-01-23 DIAGNOSIS — Z951 Presence of aortocoronary bypass graft: Secondary | ICD-10-CM | POA: Insufficient documentation

## 2012-01-23 DIAGNOSIS — R42 Dizziness and giddiness: Secondary | ICD-10-CM | POA: Diagnosis not present

## 2012-01-23 DIAGNOSIS — R002 Palpitations: Secondary | ICD-10-CM | POA: Diagnosis not present

## 2012-01-23 DIAGNOSIS — R0989 Other specified symptoms and signs involving the circulatory and respiratory systems: Secondary | ICD-10-CM | POA: Insufficient documentation

## 2012-01-23 DIAGNOSIS — I779 Disorder of arteries and arterioles, unspecified: Secondary | ICD-10-CM | POA: Diagnosis not present

## 2012-01-23 DIAGNOSIS — I2510 Atherosclerotic heart disease of native coronary artery without angina pectoris: Secondary | ICD-10-CM | POA: Diagnosis not present

## 2012-01-23 MED ORDER — TECHNETIUM TC 99M TETROFOSMIN IV KIT
10.0000 | PACK | Freq: Once | INTRAVENOUS | Status: AC | PRN
  Administered 2012-01-23: 10 via INTRAVENOUS

## 2012-01-23 MED ORDER — TECHNETIUM TC 99M TETROFOSMIN IV KIT
30.0000 | PACK | Freq: Once | INTRAVENOUS | Status: AC | PRN
  Administered 2012-01-23: 30 via INTRAVENOUS

## 2012-01-23 NOTE — Progress Notes (Signed)
[facility] SITE 3 NUCLEAR MED [address] [place] [address] [phone]  Cardiology Nuclear Med Study  [name] is a 71 y.o. male [id number] 07-Aug-1941   Nuclear Med Background Indication for Stress Test:  Evaluation for Ischemia and Graft Patency History: 09 Echo: EF=65% and normal with mild dias function,09 MPS:EF=61% and Abnormal moderate ischemia of inferior lateral, Heart Catheterization:EF= 55% and CABG x 5 Cardiac Risk Factors: Carotid Disease, Hypertension and Lipids  Symptoms:  Chest Pain and radiates to chin and neck with last episode 3 weeks ago, Dizziness, DOE(rare), Light-Headedness and Palpitations   Nuclear Pre-Procedure Caffeine/Decaff Intake:  None NPO After: 10:00pm   Lungs:  clear IV 0.9% NS with Angio Cath:  20g  IV Site: R Antecubital  IV Started by:  Stanton Kidney, EMT-P  Chest Size (in):  42 Cup Size: n/a  Height: 5\' 9"  (1.753 m)  Weight:  202 lb (91.627 kg)  BMI:  Body mass index is 29.83 kg/(m^2). Tech Comments:  Lopressor held > 24 hours, per patient.    Nuclear Med Study 1 or 2 day study: 1 day  Stress Test Type:  Stress  Reading MD: Olga Millers, MD  Order Authorizing Provider:  Burna Cash  Resting Radionuclide: Technetium 12m Tetrofosmin  Resting Radionuclide Dose: 11.0 mCi   Stress Radionuclide:  Technetium 46m Tetrofosmin  Stress Radionuclide Dose: 33.0 mCi           Stress Protocol Rest HR: 80 Stress HR: 146  Rest BP: 147/82 Stress BP: 187/95  Exercise Time (min): 7:17 METS: 8.0   Predicted Max HR: 148 bpm % Max HR: 98.65 bpm Rate Pressure Product: 46962   Dose of Adenosine (mg):  n/a Dose of Lexiscan: n/a mg  Dose of Atropine (mg): n/a Dose of Dobutamine: n/a mcg/kg/min (at max HR)  Stress Test Technologist: Cathlyn Parsons, RN  Nuclear Technologist:  Domenic Polite, CNMT     Rest Procedure:  Myocardial perfusion imaging was performed at rest 45 minutes following the intravenous  administration of Technetium 9m Tetrofosmin. Rest ECG: NSR-IRBBB  Stress Procedure:  The patient exercised for 7:17.  The patient stopped due to target heart rate,fatigue and SOB and denied any chest pain.  There were no significant ST-T wave changes and occasional PVC's.  Technetium 56m Tetrofosmin was injected at peak exercise and myocardial perfusion imaging was performed after a brief delay. Stress ECG: No significant ST segment change suggestive of ischemia.  QPS Raw Data Images:  Acquisition technically good; normal left ventricular size. Stress Images:  There is decreased uptake in the lateral wall. Rest Images:  Normal homogeneous uptake in all areas of the myocardium. Subtraction (SDS):  These findings are consistent with ischemia. Transient Ischemic Dilatation (Normal <1.22):  0.95 Lung/Heart Ratio (Normal <0.45):  0.30  Quantitative Gated Spect Images QGS EDV:  77 ml QGS ESV:  30 ml QGS cine images:  NL LV Function; NL Wall Motion QGS EF: 61%  Impression Exercise Capacity:  Fair exercise capacity. BP Response:  Normal blood pressure response. Clinical Symptoms:  No chest pain. ECG Impression:  No significant ST segment change suggestive of ischemia. Comparison with Prior Nuclear Study: No images to compare  Overall Impression:  Abnormal stress nuclear study with a small, reversible lateral defect consistent with mild ischemia.  Olga Millers

## 2012-01-24 ENCOUNTER — Other Ambulatory Visit (HOSPITAL_COMMUNITY): Payer: Self-pay | Admitting: Radiology

## 2012-01-24 DIAGNOSIS — R079 Chest pain, unspecified: Secondary | ICD-10-CM

## 2012-01-25 ENCOUNTER — Other Ambulatory Visit (INDEPENDENT_AMBULATORY_CARE_PROVIDER_SITE_OTHER): Payer: Medicare Other | Admitting: *Deleted

## 2012-01-25 ENCOUNTER — Other Ambulatory Visit: Payer: Self-pay | Admitting: *Deleted

## 2012-01-25 ENCOUNTER — Encounter: Payer: Self-pay | Admitting: *Deleted

## 2012-01-25 DIAGNOSIS — Z01810 Encounter for preprocedural cardiovascular examination: Secondary | ICD-10-CM

## 2012-01-25 DIAGNOSIS — R079 Chest pain, unspecified: Secondary | ICD-10-CM | POA: Diagnosis not present

## 2012-01-25 LAB — APTT: aPTT: 26.3 s (ref 21.7–28.8)

## 2012-01-25 LAB — CBC WITH DIFFERENTIAL/PLATELET
Basophils Absolute: 0.1 10*3/uL (ref 0.0–0.1)
Eosinophils Absolute: 0.6 10*3/uL (ref 0.0–0.7)
Hemoglobin: 16 g/dL (ref 13.0–17.0)
Lymphocytes Relative: 25.9 % (ref 12.0–46.0)
Monocytes Relative: 10.8 % (ref 3.0–12.0)
Neutro Abs: 4.4 10*3/uL (ref 1.4–7.7)
Neutrophils Relative %: 55.1 % (ref 43.0–77.0)
RBC: 4.79 Mil/uL (ref 4.22–5.81)
RDW: 13.5 % (ref 11.5–14.6)

## 2012-01-25 LAB — PROTIME-INR
INR: 1.1 ratio — ABNORMAL HIGH (ref 0.8–1.0)
Prothrombin Time: 11.6 s (ref 10.2–12.4)

## 2012-01-25 LAB — BASIC METABOLIC PANEL
CO2: 26 mEq/L (ref 19–32)
Chloride: 108 mEq/L (ref 96–112)
Creatinine, Ser: 0.7 mg/dL (ref 0.4–1.5)
Potassium: 3.7 mEq/L (ref 3.5–5.1)

## 2012-01-27 ENCOUNTER — Other Ambulatory Visit: Payer: Self-pay | Admitting: Cardiovascular Disease

## 2012-01-31 ENCOUNTER — Inpatient Hospital Stay (HOSPITAL_BASED_OUTPATIENT_CLINIC_OR_DEPARTMENT_OTHER)
Admission: RE | Admit: 2012-01-31 | Discharge: 2012-01-31 | Disposition: A | Discharge status: Home / Self Care | Payer: Medicare Other | Source: Ambulatory Visit | Attending: Cardiovascular Disease | Admitting: Cardiovascular Disease

## 2012-01-31 ENCOUNTER — Encounter (HOSPITAL_BASED_OUTPATIENT_CLINIC_OR_DEPARTMENT_OTHER)
Admission: RE | Disposition: A | Discharge status: Home / Self Care | Payer: Self-pay | Source: Ambulatory Visit | Attending: Cardiovascular Disease

## 2012-01-31 DIAGNOSIS — R0789 Other chest pain: Secondary | ICD-10-CM | POA: Insufficient documentation

## 2012-01-31 DIAGNOSIS — Z951 Presence of aortocoronary bypass graft: Secondary | ICD-10-CM | POA: Diagnosis not present

## 2012-01-31 DIAGNOSIS — I10 Essential (primary) hypertension: Secondary | ICD-10-CM | POA: Insufficient documentation

## 2012-01-31 DIAGNOSIS — R9439 Abnormal result of other cardiovascular function study: Secondary | ICD-10-CM | POA: Insufficient documentation

## 2012-01-31 DIAGNOSIS — I2510 Atherosclerotic heart disease of native coronary artery without angina pectoris: Secondary | ICD-10-CM | POA: Diagnosis not present

## 2012-01-31 SURGERY — JV LEFT HEART CATHETERIZATION WITH CORONARY/GRAFT ANGIOGRAM
Anesthesia: Moderate Sedation

## 2012-01-31 SURGERY — JV LEFT HEART CATHETERIZATION WITH CORONARY ANGIOGRAM
Anesthesia: Moderate Sedation

## 2012-01-31 MED ORDER — ACETAMINOPHEN 325 MG PO TABS: 650.0000 mg | ORAL_TABLET | ORAL | Status: DC | PRN

## 2012-01-31 MED ORDER — SODIUM CHLORIDE 0.9 % IJ SOLN: 3.0000 mL | INTRAMUSCULAR | Status: DC | PRN

## 2012-01-31 MED ORDER — SODIUM CHLORIDE 0.9 % IV SOLN: 250.0000 mL | INTRAVENOUS | Status: DC | PRN

## 2012-01-31 MED ORDER — SODIUM CHLORIDE 0.9 % IV SOLN: INTRAVENOUS | Status: AC

## 2012-01-31 MED ORDER — SODIUM CHLORIDE 0.9 % IJ SOLN: 3.0000 mL | Freq: Two times a day (BID) | INTRAMUSCULAR | Status: DC

## 2012-01-31 MED ORDER — ASPIRIN 81 MG PO CHEW
324.0000 mg | CHEWABLE_TABLET | ORAL | Status: AC
  Administered 2012-01-31: 243 mg via ORAL

## 2012-01-31 MED ORDER — SODIUM CHLORIDE 0.9 % IV SOLN: INTRAVENOUS | Status: DC

## 2012-01-31 NOTE — Op Note (Signed)
Cardiac Catheterization Procedure Note  Name: [name] MRN: [id number] DOB: [birth year]  Procedure: Left Heart Cath, Selective Coronary Angiography, SVG and LIMA angiography.  Indication: Atypical Chest pain with known history of coronary artery disease status post CABG in 2009. Abnormal nuclear stress test which showed mild and small lateral ischemia. Ejection fraction was normal.    Medications:  Sedation:   1  mg IV Versed, 25  mcg IV Fentanyl   Contrast:   95 ML Omnipaque  Procedural details: The right groin was prepped, draped, and anesthetized with 1% lidocaine. Using modified Seldinger technique, a 4  French sheath was introduced into the right femoral artery. Standard Judkins catheters were used for coronary angiography and left ventriculography. the 3 [facility] catheter was used and engaged the LIMA. An LCB catheter was used to engage the left-sided grafts.  Catheter exchanges were performed over a guidewire. right femoral angiography was performed and followed down to the mid SFA given that the wire went to the SFA Th arterial access. There were no immediate procedural complications. The patient was transferred to the post catheterization recovery area for further monitoring.   Procedural Findings:  Hemodynamics: AO:  141/76 mmHg LV:   140/7     mmHg LVEDP:  13   mmHg  Coronary angiography: Coronary dominance:  right    Left Main:  the vessel is heavily calcified. There is a 50% ostial stenosis. Distally there is a 60-70% stenosis.   Left Anterior Descending (LAD):   the vessel is mildly calcified. It has diffuse 50% disease in the mid segment. Competitive flow in the LAD is noted from the LIMA.   Circumflex (LCx):   the vessel is normal in size and nondominant. There is a 50% ostial stenosis. In the midsegment, there is a 70-80% tubular stenosis after OM1.   1st obtuse marginal:   medium in size with mild ostial stenosis. It appears to be 2 mm in diameter. This was not  bypassed.   Right Coronary Artery:  the vessel is normal in size and dominant. It's moderately calcified in the proximal and mid segment. There is diffuse 50% disease proximally. The vessel is occluded in the midsegment.   SVG to PDA: The graft is patent without significant disease. There is mild ectasia.   LIMA to LAD: The graft is patent with significant tortuosity in the mid segment. It has no significant disease the distal LAD does not have significant disease.   SVG to OM 3 : Patent without significant disease.   SVG to diagonal: Patent. There is graft to vessel mismatch at the anastomosis but I don't think there is significant stenosis.  Left ventriculography: Left ventricular systolic function was not performed. EF was normal by nuclear stress test.   Final Conclusions:   1. Significant underlying left main and three-vessel coronary artery disease with patent grafts .  2. The lateral wall ischemia might be due to ischemia in OM1 distribution which was not bypassed. The vessel is overall about 2 mm in diameter.   Recommendations:  I recommend continuing medical therapy. The patient potentially could be ischemic in OM1 distribution. However, treating this will require stenting the whole left main and ostial left circumflex which has significant calcifications. The vessel overall is about 2 mm in diameter. The risk outweighs the benefit and thus I recommend continuing medical therapy.   Lorine Bears MD, [facility] 01/31/2012, 1:33 PM

## 2012-01-31 NOTE — OR Nursing (Signed)
Discharge instructions reviewed and signed, pt stated understanding, ambulated in hall without difficulty, site level 0, transported to friend's car via wheelchair 

## 2012-01-31 NOTE — H&P (View-Only) (Signed)
[facility] SITE 3 NUCLEAR MED [address] [place] [address] [phone]  Cardiology Nuclear Med Study  [name] is a 71 y.o. male [id number] 05/15/1941   Nuclear Med Background Indication for Stress Test:  Evaluation for Ischemia and Graft Patency History: 09 Echo: EF=65% and normal with mild dias function,09 MPS:EF=61% and Abnormal moderate ischemia of inferior lateral, Heart Catheterization:EF= 55% and CABG x 5 Cardiac Risk Factors: Carotid Disease, Hypertension and Lipids  Symptoms:  Chest Pain and radiates to chin and neck with last episode 3 weeks ago, Dizziness, DOE(rare), Light-Headedness and Palpitations   Nuclear Pre-Procedure Caffeine/Decaff Intake:  None NPO After: 10:00pm   Lungs:  clear IV 0.9% NS with Angio Cath:  20g  IV Site: R Antecubital  IV Started by:  Tara Kaheny, EMT-P  Chest Size (in):  42 Cup Size: n/a  Height: 5' 9" (1.753 m)  Weight:  202 lb (91.627 kg)  BMI:  Body mass index is 29.83 kg/(m^2). Tech Comments:  Lopressor held > 24 hours, per patient.    Nuclear Med Study 1 or 2 day study: 1 day  Stress Test Type:  Stress  Reading MD: Brian Crenshaw, MD  Order Authorizing Provider:  Peter Nishan,MD  Resting Radionuclide: Technetium 99m Tetrofosmin  Resting Radionuclide Dose: 11.0 mCi   Stress Radionuclide:  Technetium 99m Tetrofosmin  Stress Radionuclide Dose: 33.0 mCi           Stress Protocol Rest HR: 80 Stress HR: 146  Rest BP: 147/82 Stress BP: 187/95  Exercise Time (min): 7:17 METS: 8.0   Predicted Max HR: 148 bpm % Max HR: 98.65 bpm Rate Pressure Product: 27594   Dose of Adenosine (mg):  n/a Dose of Lexiscan: n/a mg  Dose of Atropine (mg): n/a Dose of Dobutamine: n/a mcg/kg/min (at max HR)  Stress Test Technologist: Cynthia Hasspacher, RN  Nuclear Technologist:  Stephen Carbone, CNMT     Rest Procedure:  Myocardial perfusion imaging was performed at rest 45 minutes following the intravenous  administration of Technetium 99m Tetrofosmin. Rest ECG: NSR-IRBBB  Stress Procedure:  The patient exercised for 7:17.  The patient stopped due to target heart rate,fatigue and SOB and denied any chest pain.  There were no significant ST-T wave changes and occasional PVC's.  Technetium 99m Tetrofosmin was injected at peak exercise and myocardial perfusion imaging was performed after a brief delay. Stress ECG: No significant ST segment change suggestive of ischemia.  QPS Raw Data Images:  Acquisition technically good; normal left ventricular size. Stress Images:  There is decreased uptake in the lateral wall. Rest Images:  Normal homogeneous uptake in all areas of the myocardium. Subtraction (SDS):  These findings are consistent with ischemia. Transient Ischemic Dilatation (Normal <1.22):  0.95 Lung/Heart Ratio (Normal <0.45):  0.30  Quantitative Gated Spect Images QGS EDV:  77 ml QGS ESV:  30 ml QGS cine images:  NL LV Function; NL Wall Motion QGS EF: 61%  Impression Exercise Capacity:  Fair exercise capacity. BP Response:  Normal blood pressure response. Clinical Symptoms:  No chest pain. ECG Impression:  No significant ST segment change suggestive of ischemia. Comparison with Prior Nuclear Study: No images to compare  Overall Impression:  Abnormal stress nuclear study with a small, reversible lateral defect consistent with mild ischemia.  Brian Crenshaw     

## 2012-01-31 NOTE — Interval H&P Note (Signed)
History and Physical Interval Note:  01/31/2012 12:49 PM  [name]  has presented today for surgery, with the diagnosis of Chest Pain. He had previous CABG in 2009. Recent chest pain. Nuclear stress test showed mild lateral ischemia.   The various methods of treatment have been discussed with the patient. After consideration of risks, benefits and other options for treatment, the patient has consented to  Procedure(s): JV LEFT HEART CATHETERIZATION WITH CORONARY/GRAFT ANGIOGRAM as a surgical intervention .  The patients' history has been reviewed, patient examined, no change in status, stable for surgery.  I have reviewed the patients' chart and labs.  Questions were answered to the patient's satisfaction.     Lorine Bears

## 2012-01-31 NOTE — OR Nursing (Signed)
Meal served 

## 2012-01-31 NOTE — Progress Notes (Signed)
Bedrest begins @ 1345, Dr. Kirke Corin in to discuss results with patient and family.

## 2012-02-14 ENCOUNTER — Other Ambulatory Visit: Payer: Self-pay | Admitting: Physician Assistant

## 2012-02-14 DIAGNOSIS — D485 Neoplasm of uncertain behavior of skin: Secondary | ICD-10-CM | POA: Diagnosis not present

## 2012-02-14 DIAGNOSIS — L570 Actinic keratosis: Secondary | ICD-10-CM | POA: Diagnosis not present

## 2012-02-14 DIAGNOSIS — L259 Unspecified contact dermatitis, unspecified cause: Secondary | ICD-10-CM | POA: Diagnosis not present

## 2012-02-14 DIAGNOSIS — L909 Atrophic disorder of skin, unspecified: Secondary | ICD-10-CM | POA: Diagnosis not present

## 2012-02-19 ENCOUNTER — Encounter: Payer: Self-pay | Admitting: Cardiovascular Disease

## 2012-02-19 ENCOUNTER — Ambulatory Visit (INDEPENDENT_AMBULATORY_CARE_PROVIDER_SITE_OTHER): Payer: Medicare Other | Admitting: Cardiovascular Disease

## 2012-02-19 VITALS — BP 128/84 | HR 78 | Ht 69.0 in | Wt 204.0 lb

## 2012-02-19 DIAGNOSIS — I10 Essential (primary) hypertension: Secondary | ICD-10-CM

## 2012-02-19 DIAGNOSIS — E785 Hyperlipidemia, unspecified: Secondary | ICD-10-CM

## 2012-02-19 DIAGNOSIS — R002 Palpitations: Secondary | ICD-10-CM

## 2012-02-19 DIAGNOSIS — I2510 Atherosclerotic heart disease of native coronary artery without angina pectoris: Secondary | ICD-10-CM | POA: Diagnosis not present

## 2012-02-19 NOTE — Progress Notes (Signed)
71 yo with history of CABG.  Recent cath  By Arida  Coronary angiography:  Coronary dominance: right  Left Main: the vessel is heavily calcified. There is a 50% ostial stenosis. Distally there is a 60-70% stenosis.  Left Anterior Descending (LAD): the vessel is mildly calcified. It has diffuse 50% disease in the mid segment. Competitive flow in the LAD is noted from the LIMA.  Circumflex (LCx): the vessel is normal in size and nondominant. There is a 50% ostial stenosis. In the midsegment, there is a 70-80% tubular stenosis after OM1.  1st obtuse marginal: medium in size with mild ostial stenosis. It appears to be 2 mm in diameter. This was not bypassed.  Right Coronary Artery: the vessel is normal in size and dominant. It's moderately calcified in the proximal and mid segment. There is diffuse 50% disease proximally. The vessel is occluded in the midsegment.  SVG to PDA: The graft is patent without significant disease. There is mild ectasia.  LIMA to LAD: The graft is patent with significant tortuosity in the mid segment. It has no significant disease the distal LAD does not have significant disease.  SVG to OM 3 : Patent without significant disease.  SVG to diagonal: Patent. There is graft to vessel mismatch at the anastomosis but I don't think there is significant stenosis.  Left ventriculography: Left ventricular systolic function was not performed. EF was normal by nuclear stress test.   Medical Rx recommended.   Myovue prior to cath showed mild lateral wall ischemia.  Small unbypassed OM likely culprit Dr Kirke Corin felt Vessel small and initial attempt at medical Rx warrented  Has had RLQ pains and diarrhea.  No fever ? If statin is upsetting his stomach.  Having some fluttering in chest and palpitations.  N o SSCP  ROS: Denies fever, malais, weight loss, blurry vision, decreased visual acuity, cough, sputum, SOB, hemoptysis, pleuritic pain, palpitaitons, heartburn, abdominal pain, melena,  lower extremity edema, claudication, or rash.  All other systems reviewed and negative  General: Affect appropriate Healthy:  appears stated age HEENT: normal Neck supple with no adenopathy JVP normal no bruits no thyromegaly Lungs clear with no wheezing and good diaphragmatic motion Heart:  S1/S2 no murmur, no rub, gallop or click PMI normal Abdomen: benighn, BS positve, no tenderness, no AAA no bruit.  No HSM or HJR Distal pulses intact with no bruits No edema Neuro non-focal Skin warm and dry No muscular weakness   Current Outpatient Prescriptions  Medication Sig Dispense Refill  . aspirin 81 MG tablet Take 81 mg by mouth daily.        Marland Kitchen atorvastatin (LIPITOR) 40 MG tablet 1 every other day  45 tablet  6  . co-enzyme Q-10 30 MG capsule Take 30 mg by mouth daily.       . metoprolol (LOPRESSOR) 50 MG tablet Take 1/2 bid      . MULTIPLE VITAMIN PO Take by mouth.        . nitroGLYCERIN (NITROSTAT) 0.4 MG SL tablet Place 1 tablet (0.4 mg total) under the tongue every 5 (five) minutes as needed for chest pain.  25 tablet  4  . NON FORMULARY Joint capsule 1 tab po qd      . Omega-3 Fatty Acids (OMEGA 3 PO) Take 1 capsule by mouth daily.      . RABEprazole (ACIPHEX) 20 MG tablet Take 1 tablet (20 mg total) by mouth daily.  90 tablet  2  . Tamsulosin HCl (FLOMAX) 0.4  MG CAPS Take 1 capsule (0.4 mg total) by mouth daily.  90 capsule  1    Allergies  Propoxyphene n-acetaminophen  Electrocardiogram:  Assessment and Plan

## 2012-02-19 NOTE — Assessment & Plan Note (Signed)
Well controlled.  Continue current medications and low sodium Dash type diet.    

## 2012-02-19 NOTE — Assessment & Plan Note (Signed)
Stable with no angina and good activity level.  Continue medical Rx Recent cath with patent grafts 

## 2012-02-19 NOTE — Assessment & Plan Note (Signed)
History of PAF and CAD  21 day event monitor Continue beta blocker

## 2012-02-19 NOTE — Patient Instructions (Signed)
Your physician recommends that you schedule a follow-up appointment in: 6-8 WEEKS WITH DR [facility] Your physician has recommended you make the following change in your medication: HOLD  ATORVASTATIN  TO SEE IF STOMACH FEELS BETTER Your physician has recommended that you wear an event monitor. Event monitors are medical devices that record the heart's electrical activity. Doctors most often [place] these monitors to diagnose arrhythmias. Arrhythmias are problems with the speed or rhythm of the heartbeat. The monitor is a small, portable device. You can wear one while you do your normal daily activities. This is usually used to diagnose what is causing palpitations/syncope (passing out). DX PALPITATIONS  21 DAY EVENT MONITOR

## 2012-02-19 NOTE — Assessment & Plan Note (Signed)
Cholesterol is at goal.  Continue current dose of statin and diet Rx.  No myalgias or side effects.  F/U  LFT's in 6 months. Lab Results  Component Value Date   LDLCALC 75 11/28/2011  Will stop statin for 4-6 weeks to see if stomach improves

## 2012-02-21 ENCOUNTER — Ambulatory Visit (INDEPENDENT_AMBULATORY_CARE_PROVIDER_SITE_OTHER): Payer: Medicare Other | Admitting: Internal Medicine

## 2012-02-21 ENCOUNTER — Encounter: Payer: Self-pay | Admitting: Internal Medicine

## 2012-02-21 DIAGNOSIS — R109 Unspecified abdominal pain: Secondary | ICD-10-CM | POA: Diagnosis not present

## 2012-02-21 DIAGNOSIS — E785 Hyperlipidemia, unspecified: Secondary | ICD-10-CM | POA: Diagnosis not present

## 2012-02-21 DIAGNOSIS — I2510 Atherosclerotic heart disease of native coronary artery without angina pectoris: Secondary | ICD-10-CM | POA: Diagnosis not present

## 2012-02-21 LAB — POCT URINALYSIS DIPSTICK
Blood, UA: NEGATIVE
Nitrite, UA: NEGATIVE
Protein, UA: NEGATIVE
Urobilinogen, UA: 0.2
pH, UA: 5.5

## 2012-02-21 NOTE — Progress Notes (Signed)
Subjective:    Patient ID: [name], male    DOB: [birth year], 71 y.o.   MRN: [id number]  HPI Comes in for new problem evaluation. He is here with his wife. He actually describes this pain as being off and on for quite a while felt secondary to medication however most recently has had persistent pain every day. ? What makes it worse   Better if q o d statin meds  In the past. Now pain is Persistent and progressive.  Vomiting or weight loss or fever. Sometimes has loose stools  No blood in stool .  No treatment  To try off statin. For 2 months to see if it makes a difference. NOt associated with voiding. He has a history of a recurrent hernia on the right but this is in a different area than his discomfort Last hernia repair  10 years ago.      Most recently he has had another cardiac catheterization did not show acute occlusion and was having some palpitations an event monitor is scheduled.  He is due or overdue for his colonoscopy doesn't have a GI doctor in town.  He has some hoarseness that is a bit worse wonders if it's from reflux but no chest pain shortness of breath or exercise change.   Review of Systems Negative for major changes in vision syncope unusual new rashes, change in vascular status rest see as above   Outpatient Prescriptions Prior to Visit  Medication Sig Dispense Refill  . aspirin 81 MG tablet Take 81 mg by mouth daily.        Marland Kitchen co-enzyme Q-10 30 MG capsule Take 30 mg by mouth daily.       . metoprolol (LOPRESSOR) 50 MG tablet Take 1/2 bid      . MULTIPLE VITAMIN PO Take by mouth.        . nitroGLYCERIN (NITROSTAT) 0.4 MG SL tablet Place 1 tablet (0.4 mg total) under the tongue every 5 (five) minutes as needed for chest pain.  25 tablet  4  . NON FORMULARY Joint capsule 1 tab po qd      . Omega-3 Fatty Acids (OMEGA 3 PO) Take 1 capsule by mouth daily.      . RABEprazole (ACIPHEX) 20 MG tablet Take 1 tablet (20 mg total) by mouth daily.  90 tablet  2  .  Tamsulosin HCl (FLOMAX) 0.4 MG CAPS Take 1 capsule (0.4 mg total) by mouth daily.  90 capsule  1  . atorvastatin (LIPITOR) 40 MG tablet 1 every other day  45 tablet  6   Past history family history social history reviewed in the electronic medical record.     Objective:   Physical Exam  wdwn in nad  midly hoarse  Neck  no masses  Chest  Non tender chest wall  quiet respirations clear to auscultation CV:  S1-S2 no gallops or murmurs peripheral perfusion is normal  Abdomen:  Sof,t normal bowel sounds without hepatosplenomegaly, no guarding rebound or masses no CVA tenderness  But tenderness seem to be mid right abd no sweliing or masses  o standing has hernia repair sit but no pain at this area . Neuro grossly non focal  Ua ordered  normal Lab Results  Component Value Date   WBC 7.9 01/25/2012   HGB 16.0 01/25/2012   HCT 46.3 01/25/2012   PLT 218.0 01/25/2012   GLUCOSE 92 01/25/2012   CHOL 138 11/28/2011   TRIG 102.0 11/28/2011   HDL  42.30 11/28/2011   LDLDIRECT 187.2 03/31/2008   LDLCALC 75 11/28/2011   ALT 24 11/28/2011   AST 20 11/28/2011   NA 141 01/25/2012   K 3.7 01/25/2012   CL 108 01/25/2012   CREATININE 0.7 01/25/2012   BUN 15 01/25/2012   CO2 26 01/25/2012   TSH 1.22 11/28/2011   INR 1.1* 01/25/2012   HGBA1C 5.7 11/28/2011        Assessment & Plan:  Right abdominal pain.  This isn't really lower abdominal pain but middle that he points to up under his rib cage but he has no Murphy sign. He has attributed this pain in the past medication however it is persistent or progressive at this point I'm concerned about ruling out other underlying disease. We'll get abdominal pelvic CT scan and than a consult with GI.  He will need a colonoscopy at some point.  Cad with cath evaluation. Patent graft.  Hoarseness  Ongoing   Saw ent in the past .   ? Never had vocal cords checked.

## 2012-02-21 NOTE — Patient Instructions (Signed)
We will schedule  Abd Pelvic CT .   And then referral to Gi to evaluate the pain.

## 2012-02-22 ENCOUNTER — Ambulatory Visit: Payer: Medicare Other | Admitting: Internal Medicine

## 2012-02-23 ENCOUNTER — Telehealth: Payer: Self-pay | Admitting: *Deleted

## 2012-02-23 ENCOUNTER — Ambulatory Visit (INDEPENDENT_AMBULATORY_CARE_PROVIDER_SITE_OTHER)
Admission: RE | Admit: 2012-02-23 | Discharge: 2012-02-23 | Disposition: A | Discharge status: Home / Self Care | Payer: Medicare Other | Source: Ambulatory Visit | Attending: Internal Medicine | Admitting: Internal Medicine

## 2012-02-23 ENCOUNTER — Encounter (INDEPENDENT_AMBULATORY_CARE_PROVIDER_SITE_OTHER): Payer: Medicare Other

## 2012-02-23 DIAGNOSIS — R109 Unspecified abdominal pain: Secondary | ICD-10-CM | POA: Diagnosis not present

## 2012-02-23 DIAGNOSIS — K4091 Unilateral inguinal hernia, without obstruction or gangrene, recurrent: Secondary | ICD-10-CM | POA: Diagnosis not present

## 2012-02-23 DIAGNOSIS — R002 Palpitations: Secondary | ICD-10-CM

## 2012-02-23 DIAGNOSIS — K5730 Diverticulosis of large intestine without perforation or abscess without bleeding: Secondary | ICD-10-CM | POA: Diagnosis not present

## 2012-02-23 DIAGNOSIS — M7989 Other specified soft tissue disorders: Secondary | ICD-10-CM | POA: Diagnosis not present

## 2012-02-23 MED ORDER — IOHEXOL 300 MG/ML  SOLN
100.0000 mL | Freq: Once | INTRAMUSCULAR | Status: AC | PRN
  Administered 2012-02-23: 100 mL via INTRAVENOUS

## 2012-02-23 NOTE — Telephone Encounter (Signed)
Pt here for CT scan and had some swelling in his lip. He feels fine now and his bp is 130/70. Dr Johney Frame talked with the pt. The pt is to go home and take some benadryl. The pt was instructed to go to the ER if he develops trouble breathing, rash or if lip begins to swell again. Pt and wife voiced understanding.

## 2012-02-26 ENCOUNTER — Telehealth: Payer: Self-pay | Admitting: Internal Medicine

## 2012-02-26 NOTE — Telephone Encounter (Signed)
Wants to know if you got CAT scan results from Friday morning.

## 2012-02-26 NOTE — Progress Notes (Signed)
Quick Note:  Pt aware of results. ______ 

## 2012-02-26 NOTE — Telephone Encounter (Signed)
See ct results  

## 2012-02-27 ENCOUNTER — Telehealth: Payer: Self-pay | Admitting: Internal Medicine

## 2012-02-27 NOTE — Telephone Encounter (Signed)
Ok with me to change MD's.

## 2012-02-27 NOTE — Telephone Encounter (Signed)
Dr. Jacobs will you accept?  

## 2012-02-27 NOTE — Telephone Encounter (Signed)
Patient called stating that she would like to have a sooner GI appt for her spouse as he is in a lot of pain. Please advise.

## 2012-02-27 NOTE — Telephone Encounter (Signed)
Dr Leone Payor do you approve of change to Dr Christella Hartigan?

## 2012-02-28 ENCOUNTER — Encounter: Payer: Self-pay | Admitting: Gastroenterology

## 2012-02-28 ENCOUNTER — Ambulatory Visit (INDEPENDENT_AMBULATORY_CARE_PROVIDER_SITE_OTHER): Payer: Medicare Other | Admitting: Internal Medicine

## 2012-02-28 ENCOUNTER — Encounter: Payer: Self-pay | Admitting: Internal Medicine

## 2012-02-28 DIAGNOSIS — K7689 Other specified diseases of liver: Secondary | ICD-10-CM

## 2012-02-28 DIAGNOSIS — Q619 Cystic kidney disease, unspecified: Secondary | ICD-10-CM | POA: Diagnosis not present

## 2012-02-28 DIAGNOSIS — R109 Unspecified abdominal pain: Secondary | ICD-10-CM

## 2012-02-28 DIAGNOSIS — I7090 Unspecified atherosclerosis: Secondary | ICD-10-CM | POA: Insufficient documentation

## 2012-02-28 DIAGNOSIS — K4090 Unilateral inguinal hernia, without obstruction or gangrene, not specified as recurrent: Secondary | ICD-10-CM

## 2012-02-28 DIAGNOSIS — N281 Cyst of kidney, acquired: Secondary | ICD-10-CM | POA: Insufficient documentation

## 2012-02-28 DIAGNOSIS — L259 Unspecified contact dermatitis, unspecified cause: Secondary | ICD-10-CM

## 2012-02-28 NOTE — Progress Notes (Signed)
Subjective:    Patient ID: [name], male    DOB: 07/19/1941, 71 y.o.   MRN: [id number]  HPI Patient comes in today with his wife for followup of right-sided pain that is persistent. He also had a CT scan and would like further clarification and explanation t  of the results.  He has had no fever he did have some lip swelling when he had the CT scan. He is also doing an event monitor and having some local reaction to the scan of the stickers. He was told to not put cream or lotion on but is getting very irritated.  Review of Systems He has some early morning head feelings not really a headache perhaps dizziness is transient and goes away it is time to about the same every day is unclear if it's related to his medication no current chest pain shortness of breath syncope. He has chronic throat clearing and reflux.    Objective:   Physical Exam Well-developed well-nourished in no acute distress today chest clear abdomen soft no guarding or rebound points to the right midabdomen is area of concern  no masses or rebound.   Skin : Area around i adhesive is irritated with a rash.  See CT scan report  IMPRESSION:  1. Recurrent right inguinal hernia containing fat and a  nonspecific soft tissue nodule. This is adjacent to the urinary  bladder and could reflect a small portion of herniated bladder.  Alternately, this could reflect a lymph node or postsurgical  scarring.  2. Prominent lobularity of the pancreatic head without associated  change in density or dilatation of the pancreatic/biliary ducts.  This may reflect a normal variant. However, a small mass is  difficult to exclude. This would be best evaluated with MRCP to  include pre and postcontrast images.  3. No acute inflammatory changes identified. The appendix appears  normal. There are sigmoid colon diverticular changes.  4. Hepatic and renal cysts without suspicious features.  Original Report Authenticated By: Gerrianne Scale, M.D      Assessment & Plan:  Right mid abdominal pain ongoing unclear explanation.  Keep up the GI appointment  offered a urologic consult as to the etiology of the pain but this can certainly wait until after the GI evaluation  Cecum in the right upper quadrant the appendix is normal lobulated head of pancreas question of possible normal variant.  Renal and hepatic cysts are probably not related. Right inguinal hernia status post repair with possible bladder involvement  Atherosclerotic vascular disease seen on abdominal CT has known coronary disease and carotid disease Does not appear to be causing his symptoms.  Reassured him that the cysts on the CT scan are not the same as an abscessed cyst in the scan that he had when he was younger that was very painful and required surgery.   Head pressure symptoms intermittent possibly related to how he is taking the medicine and orthostatic changes have him take his blood pressure and pulse when he gets these in symptoms and see if it is any relationship to medication timing. He does have allergies and sinus congestion at times.   Skin irritation from event monitor it he says. Discussed using 1% hydrocortisone but not over the area where you have to add the adhesive please call cardiology  services for any other advice. This will be difficult as he needs to have this monitor on for another 20 days or so.    Total visit >  50% spent counseling and coordinating care

## 2012-02-28 NOTE — Telephone Encounter (Signed)
OK with me also.  ?

## 2012-02-28 NOTE — Patient Instructions (Signed)
Keep gi appt  Fu if other concerns in the meantime  Check bp and pulse readings with sx .  Missing metoprolol may cause some rebound sx .  Call cards about the skin irritation but would try hcs intermittently.

## 2012-02-28 NOTE — Telephone Encounter (Signed)
Called pt and informed him of MD decision, pt is schedule for 03/19/2012 with Dr. Christella Hartigan

## 2012-03-19 ENCOUNTER — Ambulatory Visit (INDEPENDENT_AMBULATORY_CARE_PROVIDER_SITE_OTHER): Payer: Medicare Other | Admitting: Gastroenterology

## 2012-03-19 ENCOUNTER — Other Ambulatory Visit (INDEPENDENT_AMBULATORY_CARE_PROVIDER_SITE_OTHER): Payer: Medicare Other

## 2012-03-19 ENCOUNTER — Encounter: Payer: Self-pay | Admitting: Gastroenterology

## 2012-03-19 VITALS — BP 124/84 | HR 80 | Ht 69.0 in | Wt 201.0 lb

## 2012-03-19 DIAGNOSIS — R933 Abnormal findings on diagnostic imaging of other parts of digestive tract: Secondary | ICD-10-CM

## 2012-03-19 LAB — CBC WITH DIFFERENTIAL/PLATELET
Eosinophils Relative: 7.2 % — ABNORMAL HIGH (ref 0.0–5.0)
Monocytes Relative: 12.2 % — ABNORMAL HIGH (ref 3.0–12.0)
Neutrophils Relative %: 59.6 % (ref 43.0–77.0)
Platelets: 216 10*3/uL (ref 150.0–400.0)
WBC: 7.1 10*3/uL (ref 4.5–10.5)

## 2012-03-19 LAB — COMPREHENSIVE METABOLIC PANEL
Albumin: 4.1 g/dL (ref 3.5–5.2)
CO2: 27 mEq/L (ref 19–32)
Calcium: 9.2 mg/dL (ref 8.4–10.5)
GFR: 95.36 mL/min (ref >60.00)
Glucose, Bld: 105 mg/dL — ABNORMAL HIGH (ref 70–99)
Potassium: 4.1 mEq/L (ref 3.5–5.1)
Sodium: 139 mEq/L (ref 135–145)
Total Protein: 7 g/dL (ref 6.0–8.3)

## 2012-03-19 NOTE — Progress Notes (Signed)
HPI: This is a   very pleasant 71 year old man whom I am meeting for the first time today.  Had colonoscopy with Dr. Leone Payor in 2006.  We are having difficulty finding the pathology from the polyp that was removed. There was a note in the colonoscopy report suggesting considering virtual colonoscopy for future procedures given the difficulty of exam.  Has had had right sided, flank pains for several months.  HE stopped his cholesterol lmedicine thinking it may be related, but not changes.  Dull pain, pressure most noticeable at night but does feel it all day (pressure, uncomfortable).  No real changes in bowels.  + nausea at times, no really associated with the pains.  Pain not positional.   No RLQ pains.  Overall weight may be up a few pounds.  No regular NSAIDs.  No overt blood in his stool.  Pain not worse with eating.  Basic metabolic profile and CBC were normal 2 months ago  He has "bad pyrosis" daily.  Takes aciphex daily and if he stops then symptoms are much worse.  Belches a lot lately.  IMPRESSION from recent CT scan:  1. Recurrent right inguinal hernia containing fat and a  nonspecific soft tissue nodule. This is adjacent to the urinary  bladder and could reflect a small portion of herniated bladder.  Alternately, this could reflect a lymph node or postsurgical  scarring.  2. Prominent lobularity of the pancreatic head without associated  change in density or dilatation of the pancreatic/biliary ducts.  This may reflect a normal variant. However, a small mass is  difficult to exclude. This would be best evaluated with MRCP to  include pre and postcontrast images.  3. No acute inflammatory changes identified. The appendix appears  normal. There are sigmoid colon diverticular changes.  4. Hepatic and renal cysts without suspicious features.    Review of systems: Pertinent positive and negative review of systems were noted in the above HPI section. Complete review of systems  was performed and was otherwise normal.    Past Medical History  Diagnosis Date  . 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 VITAMIN D DEFICIENCY 11/02/2009  . HYPERLIPIDEMIA 07/25/2007  . HYPERTENSION 07/23/2007  . CAD 01/22/2009  . Atrial fibrillation 07/06/2009  . 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  . RHINITIS, CHRONIC 05/11/2010  . ALLERGIC RHINITIS 07/23/2007  . GERD 07/25/2007  . HERNIA 07/06/2009  . 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 BENIGN PROSTATIC HYPERTROPHY 07/25/2007  . BACK PAIN, LUMBAR 10/25/2010  . LUMBAR RADICULOPATHY, RIGHT 10/18/2007  . BACK PAIN 11/02/2009  . MUSCLE PAIN 01/22/2009  . NEURITIS 10/25/2010  . DIZZINESS 01/22/2009  . Palpitations 05/09/2010  . DYSPNEA ON EXERTION 08/21/2008  . Cough 10/25/2010  . RUQ PAIN 01/22/2009  . HYPERGLYCEMIA, BORDERLINE 12/31/2007  . ECHOCARDIOGRAM, ABNORMAL 08/19/2007  . UNS ADVRS EFF OTH RX MEDICINAL&BIOLOGICAL SBSTNC 04/15/2008  . HX, PERSONAL, MALIGNANCY, SKIN NEC 10/18/2007  . COLONIC POLYPS, HX OF 10/18/2007  . Sinus polyp     Past Surgical History  Procedure Date  . Hernia repair     inguinal  . Coronary artery bypass graft 2009  . Bony pelvis surgery   . Basal cell and melanoma removed     face  . Tonsillectomy   . Back surgery     Current Outpatient Prescriptions  Medication Sig Dispense Refill  . aspirin 81 MG tablet Take 81 mg by mouth daily.        Marland Kitchen atorvastatin (LIPITOR) 40 MG tablet 1 every  other day  45 tablet  6  . clobetasol (OLUX) 0.05 % topical foam Apply topically 2 (two) times daily. [facility]      . co-enzyme Q-10 30 MG capsule Take 300 mg by mouth daily.       . metoprolol (LOPRESSOR) 50 MG tablet Take 1/2 bid      . MULTIPLE VITAMIN PO Take by mouth.        . nitroGLYCERIN (NITROSTAT) 0.4 MG SL tablet Place 1 tablet (0.4 mg total) under the tongue every 5 (five) minutes as needed for chest pain.  25 tablet  4  . NON FORMULARY Joint capsule 1 tab po qd      . Omega-3 Fatty Acids (OMEGA 3 PO) Take 1 capsule  by mouth daily.      . RABEprazole (ACIPHEX) 20 MG tablet Take 1 tablet (20 mg total) by mouth daily.  90 tablet  2  . Tamsulosin HCl (FLOMAX) 0.4 MG CAPS Take 1 capsule (0.4 mg total) by mouth daily.  90 capsule  1  . Tamsulosin HCl (FLOMAX) 0.4 MG CAPS Take by mouth.        Allergies as of 03/19/2012 - Review Complete 03/19/2012  Allergen Reaction Noted  . Propoxyphene n-acetaminophen  07/23/2007  . Iodinated diagnostic agents Swelling 02/23/2012    Family History  Problem Relation Age of Onset  . Lung cancer Mother   . Heart attack Father   . Hypertension Father   . Alcohol abuse Father   . Breast cancer Sister   . Alcohol abuse Brother   . Hypertension Brother   . Alcohol abuse Brother   . Hypertension Brother   . Alcohol abuse Brother   . Hypertension Brother   . Kidney disease Sister     History   Social History  . Marital Status: Married    Spouse Name: N/A    Number of Children: N/A  . Years of Education: N/A   Occupational History  . Not on file.   Social History Main Topics  . Smoking status: Former Smoker    Types: Cigarettes  . Smokeless tobacco: Not on file   Comment: in early 20's he quit  . Alcohol Use: Yes     socially  . Drug Use: No  . Sexually Active: Not on file   Other Topics Concern  . Not on file   Social History Narrative   Retired Engineer, maintenance smoker some etohTravels a lot       Physical Exam: BP 124/84  Pulse 80  Ht 5\' 9"  (1.753 m)  Wt 201 lb (91.173 kg)  BMI 29.68 kg/m2 Constitutional: generally well-appearing Psychiatric: alert and oriented x3 Eyes: extraocular movements intact Mouth: oral pharynx moist, no lesions Neck: supple no lymphadenopathy Cardiovascular: heart regular rate and rhythm Lungs: clear to auscultation bilaterally Abdomen: soft, nontender, nondistended, no obvious ascites, no peritoneal signs, normal bowel sounds Extremities: no lower extremity edema bilaterally Skin: no lesions on visible  extremities    Assessment and plan: 71 y.o. male with  right sided abdominal pains  These do not particularly sound biliary in nature. They do not sound gastric defect. His pancreas was somewhat unusual appearing on recent CT scan I think that bears followup with further imaging. I am going to set up an MRI with MRCP images. He will also get a complete metabolic profile and repeat CBC today. If these tests are unrevealing then we may proceed with EGD.

## 2012-03-19 NOTE — Patient Instructions (Addendum)
You will have labs checked today in the basement lab.  Please head down after you check out with the front desk  (cmet, cbc). MRI of pancreas with MRCP images as well (to follow up right sided abd pain and unusual CT findings of pancreas).  [facility] on 03/21/12 please arrive at 845 am and have nothing to eat or drink after midnight. May need EGD, depending on the results above.

## 2012-03-21 ENCOUNTER — Other Ambulatory Visit: Payer: Self-pay | Admitting: Gastroenterology

## 2012-03-21 ENCOUNTER — Encounter: Payer: Self-pay | Admitting: Gastroenterology

## 2012-03-21 ENCOUNTER — Ambulatory Visit (HOSPITAL_COMMUNITY)
Admission: RE | Admit: 2012-03-21 | Discharge: 2012-03-21 | Disposition: A | Discharge status: Home / Self Care | Payer: Medicare Other | Source: Ambulatory Visit | Attending: Gastroenterology | Admitting: Gastroenterology

## 2012-03-21 ENCOUNTER — Telehealth: Payer: Self-pay | Admitting: *Deleted

## 2012-03-21 DIAGNOSIS — R109 Unspecified abdominal pain: Secondary | ICD-10-CM | POA: Insufficient documentation

## 2012-03-21 DIAGNOSIS — K7689 Other specified diseases of liver: Secondary | ICD-10-CM | POA: Diagnosis not present

## 2012-03-21 DIAGNOSIS — R935 Abnormal findings on diagnostic imaging of other abdominal regions, including retroperitoneum: Secondary | ICD-10-CM | POA: Insufficient documentation

## 2012-03-21 DIAGNOSIS — Z9889 Other specified postprocedural states: Secondary | ICD-10-CM

## 2012-03-21 DIAGNOSIS — R933 Abnormal findings on diagnostic imaging of other parts of digestive tract: Secondary | ICD-10-CM

## 2012-03-21 DIAGNOSIS — N281 Cyst of kidney, acquired: Secondary | ICD-10-CM | POA: Insufficient documentation

## 2012-03-21 DIAGNOSIS — Z1389 Encounter for screening for other disorder: Secondary | ICD-10-CM | POA: Diagnosis not present

## 2012-03-21 DIAGNOSIS — K869 Disease of pancreas, unspecified: Secondary | ICD-10-CM | POA: Diagnosis not present

## 2012-03-21 MED ORDER — GADOBENATE DIMEGLUMINE 529 MG/ML IV SOLN
20.0000 mL | Freq: Once | INTRAVENOUS | Status: AC | PRN
  Administered 2012-03-21: 19 mL via INTRAVENOUS

## 2012-03-21 NOTE — Telephone Encounter (Signed)
PT AWARE OF MONITOR RESULTS  PER DR NISHAN  SR WITH OCC PVC'S NO SIG ARRHYTHMIAS./CY

## 2012-04-01 ENCOUNTER — Telehealth: Payer: Self-pay

## 2012-04-01 NOTE — Telephone Encounter (Signed)
Pharmacist called and said that pt is needing refills for Metoprolol, Tamulosin, and Aciphex. Pls see previouse note.

## 2012-04-01 NOTE — Telephone Encounter (Signed)
Pt and his wife are leaving for [place] on April 25th.  Pt would like a written rx for  A 90 day supply of metoprolol, tamulosin, and aciphex (#30 with 3 rf due to insurance purposes).  Pls advise.

## 2012-04-02 ENCOUNTER — Ambulatory Visit (AMBULATORY_SURGERY_CENTER): Payer: Medicare Other

## 2012-04-02 VITALS — Ht 69.0 in | Wt 207.4 lb

## 2012-04-02 DIAGNOSIS — R110 Nausea: Secondary | ICD-10-CM

## 2012-04-02 DIAGNOSIS — R1011 Right upper quadrant pain: Secondary | ICD-10-CM

## 2012-04-02 MED ORDER — RABEPRAZOLE SODIUM 20 MG PO TBEC: 20.0000 mg | DELAYED_RELEASE_TABLET | Freq: Every day | ORAL | Status: DC

## 2012-04-02 MED ORDER — TAMSULOSIN HCL 0.4 MG PO CAPS: 0.4000 mg | ORAL_CAPSULE | Freq: Every day | ORAL | Status: DC

## 2012-04-02 MED ORDER — METOPROLOL TARTRATE 50 MG PO TABS: ORAL_TABLET | ORAL | Status: DC

## 2012-04-02 NOTE — Telephone Encounter (Signed)
Pt aware rx ready for pick up. 

## 2012-04-02 NOTE — Telephone Encounter (Signed)
Ok to refill( if i am writing these rxed )  please arrange this  For pt

## 2012-04-02 NOTE — Telephone Encounter (Signed)
Rx ready for pick up. 

## 2012-04-03 ENCOUNTER — Encounter: Payer: Self-pay | Admitting: Internal Medicine

## 2012-04-04 ENCOUNTER — Encounter: Payer: Self-pay | Admitting: Cardiovascular Disease

## 2012-04-04 ENCOUNTER — Ambulatory Visit (INDEPENDENT_AMBULATORY_CARE_PROVIDER_SITE_OTHER): Payer: Medicare Other | Admitting: Cardiovascular Disease

## 2012-04-04 VITALS — BP 136/79 | HR 91 | Ht 69.0 in | Wt 206.0 lb

## 2012-04-04 DIAGNOSIS — I2510 Atherosclerotic heart disease of native coronary artery without angina pectoris: Secondary | ICD-10-CM

## 2012-04-04 DIAGNOSIS — I10 Essential (primary) hypertension: Secondary | ICD-10-CM

## 2012-04-04 DIAGNOSIS — E785 Hyperlipidemia, unspecified: Secondary | ICD-10-CM

## 2012-04-04 DIAGNOSIS — R109 Unspecified abdominal pain: Secondary | ICD-10-CM

## 2012-04-04 DIAGNOSIS — R002 Palpitations: Secondary | ICD-10-CM | POA: Diagnosis not present

## 2012-04-04 NOTE — Assessment & Plan Note (Addendum)
Cholesterol is at goal.  Continue current dose of statin and diet Rx.  No myalgias or side effects.  F/U  LFT's in 6 months. Lab Results  Component Value Date   LDLCALC 75 11/28/2011   Resume statin as abdominal pain not related

## 2012-04-04 NOTE — Patient Instructions (Signed)
Your physician wants you to follow-up in: 6 months.  You will receive a reminder letter in the mail two months in advance. If you don't receive a letter, please call our office to schedule the follow-up appointment.  Your physician has recommended you make the following change in your medication: Restart your cholesterol medication

## 2012-04-04 NOTE — Assessment & Plan Note (Signed)
F/U Christella Hartigan.  EGD  Pancrease apparantly ok on MRI

## 2012-04-04 NOTE — Assessment & Plan Note (Signed)
Recent cath with patent grafts 2/13  Medical Rx No chest pain

## 2012-04-04 NOTE — Assessment & Plan Note (Signed)
Reviewed life watch 3.1-3/21  SR no afib PVC;s  Continue current Rx

## 2012-04-04 NOTE — Progress Notes (Signed)
[name] is seen today post CABG in 2009.l He has not had any palpitaitons or dyspnea. He has leg cramps with most statins. He is tolerating lipitor qod with an LDL of 82 He has prostate issues and flomax is working fairly well.He has had recurrent hernias in the past and is having recurrent issues on the right. I gave hiim Dr Maris Berger name as a referral. Otherwise he has been compliant with his meds and walks on a daily basis. He had some palpitations and Event monitor was benign.  Had some chest pain first part of January One episode after eating ? Indigestion lasted less than a minute. 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   SSCP cath 2/13 Ar[facility] Patent grafts.  Lesion in small unbypassed OM Rx medically  Has RLQ pain being worked up by GI ? Abnormal pancrease  ROS: Denies fever, malais, weight loss, blurry vision, decreased visual acuity, cough, sputum, SOB, hemoptysis, pleuritic pain, palpitaitons, heartburn, abdominal pain, melena, lower extremity edema, claudication, or rash.  All other systems reviewed and negative  General: Affect appropriate Healthy:  appears stated age HEENT: normal Neck supple with no adenopathy JVP normal no bruits no thyromegaly Lungs clear with no wheezing and good diaphragmatic motion Heart:  S1/S2 no murmur, no rub, gallop or click PMI normal Abdomen: benighn, BS positve, no tenderness, no AAA no bruit.  No HSM or HJR Distal pulses intact with no bruits No edema Neuro non-focal Skin warm and dry No muscular weakness   Current Outpatient Prescriptions  Medication Sig Dispense Refill  . aspirin 81 MG tablet Take 81 mg by mouth daily.        Marland Kitchen atorvastatin (LIPITOR) 40 MG tablet 1 every other day  45 tablet  6  . clobetasol (OLUX) 0.05 % topical foam Apply topically 2 (two) times daily. [facility]      . co-enzyme Q-10 30 MG capsule Take 300 mg by mouth daily.       . metoprolol (LOPRESSOR) 50 MG tablet Take 1/2 bid  90 tablet  0  . MULTIPLE VITAMIN PO Take 1 tablet by mouth daily.       . nitroGLYCERIN (NITROSTAT) 0.4 MG SL tablet Place 1 tablet (0.4 mg total) under the tongue every 5 (five) minutes as needed for chest pain.  25 tablet  4  . NON FORMULARY Joint capsule 1 tab po qd      . Omega-3 Fatty Acids (OMEGA 3 PO) Take 1 capsule by mouth daily.      . RABEprazole (ACIPHEX) 20 MG tablet Take 1 tablet (20 mg total) by mouth daily.  30 tablet  3  . Tamsulosin HCl (FLOMAX) 0.4 MG CAPS Take 1 capsule (0.4 mg total) by mouth daily.  90 capsule  0    Allergies  Propoxacet-n and Iodinated diagnostic agents  Electrocardiogram:  Assessment and Plan

## 2012-04-04 NOTE — Assessment & Plan Note (Signed)
Well controlled.  Continue current medications and low sodium Dash type diet.    

## 2012-04-16 ENCOUNTER — Encounter: Payer: Self-pay | Admitting: Gastroenterology

## 2012-04-16 ENCOUNTER — Ambulatory Visit (AMBULATORY_SURGERY_CENTER): Payer: Medicare Other | Admitting: Gastroenterology

## 2012-04-16 VITALS — BP 151/105 | HR 90 | Temp 98.8°F | Resp 15 | Ht 69.0 in | Wt 207.0 lb

## 2012-04-16 DIAGNOSIS — R110 Nausea: Secondary | ICD-10-CM

## 2012-04-16 DIAGNOSIS — D1330 Benign neoplasm of unspecified part of small intestine: Secondary | ICD-10-CM

## 2012-04-16 DIAGNOSIS — R1011 Right upper quadrant pain: Secondary | ICD-10-CM

## 2012-04-16 DIAGNOSIS — K2970 Gastritis, unspecified, without bleeding: Secondary | ICD-10-CM

## 2012-04-16 MED ORDER — SODIUM CHLORIDE 0.9 % IV SOLN: 500.0000 mL | INTRAVENOUS | Status: DC

## 2012-04-16 NOTE — Patient Instructions (Signed)
YOU HAD AN ENDOSCOPIC PROCEDURE TODAY AT THE [facility]: Refer to the procedure report that was given to you for any specific questions about what was found during the examination.  If the procedure report does not answer your questions, please call your gastroenterologist to clarify.  If you requested that your care partner not be given the details of your procedure findings, then the procedure report has been included in a sealed envelope for you to review at your convenience later.  YOU SHOULD EXPECT: Some feelings of bloating in the abdomen. Passage of more gas than usual.  Walking can help get rid of the air that was put into your GI tract during the procedure and reduce the bloating. If you had a lower endoscopy (such as a colonoscopy or flexible sigmoidoscopy) you may notice spotting of blood in your stool or on the toilet paper. If you underwent a bowel prep for your procedure, then you may not have a normal bowel movement for a few days.  DIET: Your first meal following the procedure should be a light meal and then it is ok to progress to your normal diet.  A half-sandwich or bowl of soup is an example of a good first meal.  Heavy or fried foods are harder to digest and may make you feel nauseous or bloated.  Likewise meals heavy in dairy and vegetables can cause extra gas to form and this can also increase the bloating.  Drink plenty of fluids but you should avoid alcoholic beverages for 24 hours.  ACTIVITY: Your care partner should take you home directly after the procedure.  You should plan to take it easy, moving slowly for the rest of the day.  You can resume normal activity the day after the procedure however you should NOT DRIVE or use heavy machinery for 24 hours (because of the sedation medicines used during the test).    SYMPTOMS TO REPORT IMMEDIATELY: A gastroenterologist can be reached at any hour.  During normal business hours, 8:30 AM to 5:00 PM Monday through Friday,  call [phone].  After hours and on weekends, please call the GI answering service at [phone] who will take a message and have the physician on call contact you.   Following lower endoscopy (colonoscopy or flexible sigmoidoscopy):  Excessive amounts of blood in the stool  Significant tenderness or worsening of abdominal pains  Swelling of the abdomen that is new, acute  Fever of 100F or higher  Following upper endoscopy (EGD)  Vomiting of blood or coffee ground material  New chest pain or pain under the shoulder blades  Painful or persistently difficult swallowing  New shortness of breath  Fever of 100F or higher  Black, tarry-looking stools  FOLLOW UP: If any biopsies were taken you will be contacted by phone or by letter within the next 1-3 weeks.  Call your gastroenterologist if you have not heard about the biopsies in 3 weeks.  Our staff will call the home number listed on your records the next business day following your procedure to check on you and address any questions or concerns that you may have at that time regarding the information given to you following your procedure. This is a courtesy call and so if there is no answer at the home number and we have not heard from you through the emergency physician on call, we will assume that you have returned to your regular daily activities without incident.  SIGNATURES/CONFIDENTIALITY: You and/or your care   partner have signed paperwork which will be entered into your electronic medical record.  These signatures attest to the fact that that the information above on your After Visit Summary has been reviewed and is understood.  Full responsibility of the confidentiality of this discharge information lies with you and/or your care-partner.  

## 2012-04-16 NOTE — Op Note (Signed)
[facility] [address] [place], [place]  [address]  ENDOSCOPY PROCEDURE REPORT  PATIENT:  [name], [name]  MR#:  [id number] BIRTHDATE:  29-Apr-1941, 70 yrs. old  GENDER:  male ENDOSCOPIST:  Rachael Fee, MD PROCEDURE DATE:  04/16/2012 PROCEDURE:  EGD with biopsy, [address] ASA CLASS:  Class II INDICATIONS:  right sided abd pains MEDICATIONS:   Fentanyl 25 mcg IV, These medications were titrated to patient response per physician's verbal order, Versed 4 mg IV TOPICAL ANESTHETIC:  Cetacaine Spray  DESCRIPTION OF PROCEDURE:   After the risks benefits and alternatives of the procedure were thoroughly explained, informed consent was obtained.  The LB [id number]-[id number] endoscope was introduced through the mouth and advanced to the second portion of the duodenum, without limitations.  The instrument was slowly withdrawn as the mucosa was fully examined. <<PROCEDUREIMAGES>> There was minor gastritis, this was biopsied and sent to pathology (jar 1) (see image1).  Otherwise the examination was normal (see image2, image3, image5, and image6).    Retroflexed views revealed no abnormalities.    The scope was then withdrawn from the patient and the procedure completed. COMPLICATIONS:  None  ENDOSCOPIC IMPRESSION: 1) Mild gastritis, biopsied to check  for H. pylori 2) Otherwise normal examination  RECOMMENDATIONS: 1) Await pathology results  ______________________________ Rachael Fee, MD  n. eSIGNED:   Rachael Fee at 04/16/2012 02:42 PM  Alden Benjamin, [id number]

## 2012-04-17 ENCOUNTER — Telehealth: Payer: Self-pay

## 2012-04-17 NOTE — Telephone Encounter (Signed)
Left message on answering machine. 

## 2012-04-19 ENCOUNTER — Telehealth: Payer: Self-pay | Admitting: Gastroenterology

## 2012-04-19 NOTE — Telephone Encounter (Signed)
Pt is aware that the results are not available I will call him as soon as the results are reviewed

## 2012-04-23 ENCOUNTER — Encounter: Payer: Self-pay | Admitting: Gastroenterology

## 2012-04-29 ENCOUNTER — Telehealth: Payer: Self-pay | Admitting: Gastroenterology

## 2012-04-29 NOTE — Telephone Encounter (Signed)
Pt wife notified that the results were not yet available.  I advised I would call when available

## 2012-05-06 ENCOUNTER — Telehealth: Payer: Self-pay | Admitting: Internal Medicine

## 2012-05-06 NOTE — Telephone Encounter (Signed)
Pt has been notified of the results.  He says he feels much better and will call if any thing changes.  I did apologize to him for the delay.  The pt thanked me for calling

## 2012-05-06 NOTE — Telephone Encounter (Signed)
Patient's spouse called stating that she has been trying for 3 weeks to receive the results of her spouses pathology from his endoscopy and can not seem to get them. Patient's spouse states they can not move forward without the results and would like a call of explanation. Please assist.

## 2012-05-06 NOTE — Telephone Encounter (Signed)
Patty, Can you please call him.  Not sure what happened but please apologize for the delay.  Biopsies showed no sign of h. Pylori infection.  Ask how he is feeling.

## 2012-05-06 NOTE — Telephone Encounter (Signed)
I believe this is Dr Larae Grooms patient. I will forward this to him

## 2012-08-01 ENCOUNTER — Other Ambulatory Visit: Payer: Self-pay | Admitting: Cardiology

## 2012-08-01 DIAGNOSIS — I6529 Occlusion and stenosis of unspecified carotid artery: Secondary | ICD-10-CM

## 2012-08-05 ENCOUNTER — Encounter (INDEPENDENT_AMBULATORY_CARE_PROVIDER_SITE_OTHER): Payer: Medicare Other

## 2012-08-05 DIAGNOSIS — I6529 Occlusion and stenosis of unspecified carotid artery: Secondary | ICD-10-CM

## 2012-08-22 ENCOUNTER — Telehealth: Payer: Self-pay | Admitting: *Deleted

## 2012-08-22 NOTE — Telephone Encounter (Signed)
PT'S WIFE AWARE OF CAROTID RESULTS PER DR NISHANMILD DISEASE F/U CAROTID IN 2 YEARS .[name]

## 2012-08-28 ENCOUNTER — Other Ambulatory Visit: Payer: Self-pay | Admitting: Internal Medicine

## 2012-09-02 ENCOUNTER — Ambulatory Visit (INDEPENDENT_AMBULATORY_CARE_PROVIDER_SITE_OTHER): Payer: Medicare Other | Admitting: Internal Medicine

## 2012-09-02 ENCOUNTER — Encounter: Payer: Self-pay | Admitting: Internal Medicine

## 2012-09-02 VITALS — BP 140/86 | HR 93 | Temp 98.3°F | Wt 203.0 lb

## 2012-09-02 DIAGNOSIS — N139 Obstructive and reflux uropathy, unspecified: Secondary | ICD-10-CM | POA: Diagnosis not present

## 2012-09-02 DIAGNOSIS — R7309 Other abnormal glucose: Secondary | ICD-10-CM

## 2012-09-02 DIAGNOSIS — Z23 Encounter for immunization: Secondary | ICD-10-CM

## 2012-09-02 DIAGNOSIS — I10 Essential (primary) hypertension: Secondary | ICD-10-CM

## 2012-09-02 DIAGNOSIS — I7090 Unspecified atherosclerosis: Secondary | ICD-10-CM

## 2012-09-02 DIAGNOSIS — N138 Other obstructive and reflux uropathy: Secondary | ICD-10-CM

## 2012-09-02 DIAGNOSIS — E785 Hyperlipidemia, unspecified: Secondary | ICD-10-CM

## 2012-09-02 DIAGNOSIS — N401 Enlarged prostate with lower urinary tract symptoms: Secondary | ICD-10-CM | POA: Diagnosis not present

## 2012-09-02 MED ORDER — METOPROLOL TARTRATE 50 MG PO TABS: ORAL_TABLET | ORAL | Status: DC

## 2012-09-02 MED ORDER — ATORVASTATIN CALCIUM 40 MG PO TABS: ORAL_TABLET | ORAL | Status: DC

## 2012-09-02 NOTE — Progress Notes (Signed)
Subjective:    Patient ID: [name], male    DOB: Feb 26, 1941, 71 y.o.   MRN: [id number]  HPI Patient comes in today for SDA for  new problem  Concern /evaluation.going out of town for weeks, here With wife.   bp readings have been up more than usual? .    Readings  142 and 151 and 158  At a time   Has been more tired than usual.  But no cp sob change  155 /95   No bleeding vision change swelling . Has machine with him, LIPIDS  Needs refill of atorva denies se ?  And also metoprolol . On 25 bid for now.  No racing palpitations at present.   Review of Systems Neg fever chills uti sx  hematuria flank pain depression but tired.  No syncope . Rest as per hpi Past history family history social history reviewed in the electronic medical record.  Outpatient Encounter Prescriptions as of 09/02/2012  Medication Sig Dispense Refill  . ACIPHEX 20 MG tablet TAKE 1 TABLET EVERY DAY  30 tablet  2  . aspirin 81 MG tablet Take 81 mg by mouth daily.        Marland Kitchen atorvastatin (LIPITOR) 40 MG tablet 1 every other day  45 tablet  6  . clobetasol (OLUX) 0.05 % topical foam Apply topically 2 (two) times daily. [facility]      . co-enzyme Q-10 30 MG capsule Take 300 mg by mouth daily.       . metoprolol (LOPRESSOR) 50 MG tablet Take 1/2 bid  90 tablet  0  . MULTIPLE VITAMIN PO Take 1 tablet by mouth daily.       . nitroGLYCERIN (NITROSTAT) 0.4 MG SL tablet Place 1 tablet (0.4 mg total) under the tongue every 5 (five) minutes as needed for chest pain.  25 tablet  4  . NON FORMULARY Joint capsule 1 tab po qd      . Omega-3 Fatty Acids (OMEGA 3 PO) Take 1 capsule by mouth daily.      . Tamsulosin HCl (FLOMAX) 0.4 MG CAPS Take 1 capsule (0.4 mg total) by mouth daily.  90 capsule  0   Facility-Administered Encounter Medications as of 09/02/2012  Medication Dose Route Frequency Provider Last Rate Last Dose  . 0.9 %  sodium chloride infusion  500 mL Intravenous Continuous Rachael Fee, MD             Objective:   Physical Exam BP 140/86  Pulse 93  Temp 98.3 F (36.8 C) (Oral)  Wt 203 lb (92.08 kg)  SpO2 95% Wt Readings from Last 3 Encounters:  09/02/12 203 lb (92.08 kg)  04/16/12 207 lb (93.895 kg)  04/04/12 206 lb (93.441 kg)   Repeat bp reading 142/89 and 148   Pt machine is 151 and 158  Sitting . Repeat after sitting 124/80 right.  Neck no masses nor adenopathy C\Chest:  Clear to A&P without wheezes rales or rhonchi CV:  S1-S2 no gallops or murmurs peripheral perfusion is normal RR Abdomen:  Sof,t normal bowel sounds without hepatosplenomegaly, no guarding rebound or masses no CVA tenderness No clubbing cyanosis or edema Gait steady no tremor.  Skin no bruising bleeding. Oriented x 3 and no noted deficits in memory, attention, and speech. Reviewed last labs      Assessment & Plan:   HT  Elevated readings but discrepancy in machine and our readings.  Uncertain  May need to intensify therapy add  on med vs increase the metoprolol  Will get more readings and can call and we can increase is pulse steady.  lsi  Discussed. Get tsh Uncertain if "fatigue" is stress or physiologic .  Labs  Pending today including tsh .  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  BPH  Disc screening risk benefit will ge psa today. Has had exam in recent past. Declined exam today.

## 2012-09-02 NOTE — Patient Instructions (Addendum)
Your bp reading on repeat was normal 120 - 130 /80 range   Continue recheck rest before reading  If over 150 on a reg basis contact us and we may increase her medication dose  Or consider adding new med .  Will notify you  of labs when available.  Plan OV in December   With labs then  Or as needed

## 2012-09-03 ENCOUNTER — Other Ambulatory Visit (INDEPENDENT_AMBULATORY_CARE_PROVIDER_SITE_OTHER): Payer: Medicare Other

## 2012-09-03 DIAGNOSIS — Z23 Encounter for immunization: Secondary | ICD-10-CM

## 2012-09-03 DIAGNOSIS — N139 Obstructive and reflux uropathy, unspecified: Secondary | ICD-10-CM

## 2012-09-03 DIAGNOSIS — N401 Enlarged prostate with lower urinary tract symptoms: Secondary | ICD-10-CM

## 2012-09-03 DIAGNOSIS — I10 Essential (primary) hypertension: Secondary | ICD-10-CM

## 2012-09-03 DIAGNOSIS — N138 Other obstructive and reflux uropathy: Secondary | ICD-10-CM

## 2012-09-03 DIAGNOSIS — R7309 Other abnormal glucose: Secondary | ICD-10-CM

## 2012-09-03 DIAGNOSIS — E785 Hyperlipidemia, unspecified: Secondary | ICD-10-CM

## 2012-09-03 LAB — HEPATIC FUNCTION PANEL
AST: 18 U/L (ref 0–37)
Albumin: 4.2 g/dL (ref 3.5–5.2)
Alkaline Phosphatase: 57 U/L (ref 39–117)
Total Protein: 6.6 g/dL (ref 6.0–8.3)

## 2012-09-03 LAB — LIPID PANEL
Cholesterol: 144 mg/dL (ref 0–200)
Total CHOL/HDL Ratio: 4
Triglycerides: 75 mg/dL (ref 0.0–149.0)

## 2012-09-03 LAB — BASIC METABOLIC PANEL
Calcium: 9.4 mg/dL (ref 8.4–10.5)
Creatinine, Ser: 0.8 mg/dL (ref 0.4–1.5)

## 2012-09-05 ENCOUNTER — Encounter: Payer: Self-pay | Admitting: Family Medicine

## 2012-09-13 DIAGNOSIS — M65849 Other synovitis and tenosynovitis, unspecified hand: Secondary | ICD-10-CM | POA: Diagnosis not present

## 2012-09-13 DIAGNOSIS — M65839 Other synovitis and tenosynovitis, unspecified forearm: Secondary | ICD-10-CM | POA: Diagnosis not present

## 2012-11-04 ENCOUNTER — Other Ambulatory Visit: Payer: Self-pay | Admitting: Internal Medicine

## 2012-11-04 MED ORDER — RABEPRAZOLE SODIUM 20 MG PO TBEC: 20.0000 mg | DELAYED_RELEASE_TABLET | Freq: Every day | ORAL | Status: DC

## 2012-11-04 MED ORDER — TAMSULOSIN HCL 0.4 MG PO CAPS: 0.4000 mg | ORAL_CAPSULE | Freq: Every day | ORAL | Status: DC

## 2012-11-04 NOTE — Telephone Encounter (Signed)
Pt 's wife called. They are in [place], [place]. [facility] there needs [facility] to fill pt's script for Aciphex and Tamsulosin.  [facility] [phone][facility]  Their fax is [phone].  You can contact pt at [phone]

## 2012-11-04 NOTE — Telephone Encounter (Signed)
Both medications were printed and faxed to the below listed number.

## 2013-01-22 ENCOUNTER — Encounter: Payer: Self-pay | Admitting: Internal Medicine

## 2013-01-22 ENCOUNTER — Ambulatory Visit (INDEPENDENT_AMBULATORY_CARE_PROVIDER_SITE_OTHER): Payer: Medicare Other | Admitting: Internal Medicine

## 2013-01-22 VITALS — BP 140/80 | HR 82 | Temp 98.4°F | Wt 205.0 lb

## 2013-01-22 DIAGNOSIS — K7689 Other specified diseases of liver: Secondary | ICD-10-CM

## 2013-01-22 DIAGNOSIS — I2510 Atherosclerotic heart disease of native coronary artery without angina pectoris: Secondary | ICD-10-CM | POA: Diagnosis not present

## 2013-01-22 DIAGNOSIS — I10 Essential (primary) hypertension: Secondary | ICD-10-CM

## 2013-01-22 DIAGNOSIS — I7090 Unspecified atherosclerosis: Secondary | ICD-10-CM

## 2013-01-22 DIAGNOSIS — R109 Unspecified abdominal pain: Secondary | ICD-10-CM | POA: Diagnosis not present

## 2013-01-22 DIAGNOSIS — E785 Hyperlipidemia, unspecified: Secondary | ICD-10-CM

## 2013-01-22 LAB — POCT URINALYSIS DIPSTICK
Bilirubin, UA: NEGATIVE
Ketones, UA: NEGATIVE
Leukocytes, UA: NEGATIVE
Spec Grav, UA: 1.02
pH, UA: 5.5

## 2013-01-22 NOTE — Progress Notes (Signed)
Chief Complaint  Patient presents with  . Abdominal Pain    Pt says has had the pain for a long time but has increased over the past few days.  On a pain scale he would rate it at a 4-5.  Marland Kitchen Nausea    HPI: Patient comes in today for SDA for  problem evaluation.  CO  ruq pain for a year or so.  Persistent  But now worse  over the last few  Days with nausea .   No change bowel habits  No vomiting or diarrhea . May have flared up after eating ice cream. Uncertain what to do. After his evaluation last spring he was in [place] and [place] and has been back in the area this month. Right inguinal henria pain  ocass not bad and this current pain is different  And not the same pain as above as this one.   ROS: See pertinent positives and negatives per HPI. No fever weight loss has burping and bile tasting in mouth  As usual  ? If ice cream made it worse. No heart lung symptoms currently falling change in medication unusual bleeding. He is now on a generic version of a PPI doesn't have the name today. Seems to work well according to him  Past Medical History  Diagnosis Date  . 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 VITAMIN D DEFICIENCY 11/02/2009  . HYPERLIPIDEMIA 07/25/2007  . HYPERTENSION 07/23/2007  . CAD 01/22/2009  . Atrial fibrillation 07/06/2009  . 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  . RHINITIS, CHRONIC 05/11/2010  . ALLERGIC RHINITIS 07/23/2007  . GERD 07/25/2007  . HERNIA 07/06/2009  . 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 BENIGN PROSTATIC HYPERTROPHY 07/25/2007  . BACK PAIN, LUMBAR 10/25/2010  . LUMBAR RADICULOPATHY, RIGHT 10/18/2007  . BACK PAIN 11/02/2009  . MUSCLE PAIN 01/22/2009  . NEURITIS 10/25/2010  . DIZZINESS 01/22/2009  . Palpitations 05/09/2010  . DYSPNEA ON EXERTION 08/21/2008  . Cough 10/25/2010  . RUQ PAIN 01/22/2009  . HYPERGLYCEMIA, BORDERLINE 12/31/2007  . ECHOCARDIOGRAM, ABNORMAL 08/19/2007  . UNS ADVRS EFF OTH RX MEDICINAL&BIOLOGICAL SBSTNC 04/15/2008  . HX, PERSONAL, MALIGNANCY, SKIN  NEC 10/18/2007  . COLONIC POLYPS, HX OF 10/18/2007  . Sinus polyp   . Voice strain     Family History  Problem Relation Age of Onset  . Lung cancer Mother   . Heart attack Father   . Hypertension Father   . Alcohol abuse Father   . Heart disease Father   . Breast cancer Sister   . Alcohol abuse Brother   . Hypertension Brother   . Alcohol abuse Brother   . Hypertension Brother   . Alcohol abuse Brother   . Hypertension Brother   . Kidney disease Sister     History   Social History  . Marital Status: Married    Spouse Name: N/A    Number of Children: N/A  . Years of Education: N/A   Social History Main Topics  . Smoking status: Former Smoker    Types: Cigarettes    Quit date: 04/02/1964  . Smokeless tobacco: Never Used     Comment: in early 20's he quit  . Alcohol Use: No     Comment: socially  . Drug Use: No  . Sexually Active: None   Other Topics Concern  . None   Social History Narrative   Retired [employer] smoker some etohTravels a lot    Outpatient Encounter Prescriptions as of 01/22/2013  Medication Sig  Dispense Refill  . aspirin 81 MG tablet Take 81 mg by mouth daily.        Marland Kitchen atorvastatin (LIPITOR) 40 MG tablet 1 every other day  45 tablet  6  . clobetasol (OLUX) 0.05 % topical foam Apply topically 2 (two) times daily. [facility]      . co-enzyme Q-10 30 MG capsule Take 300 mg by mouth daily.       . metoprolol (LOPRESSOR) 50 MG tablet Take 1/2 bid  90 tablet  3  . MULTIPLE VITAMIN PO Take 1 tablet by mouth daily.       . NON FORMULARY Joint capsule 1 tab po qd      . Omega-3 Fatty Acids (OMEGA 3 PO) Take 1 capsule by mouth daily.      . RABEprazole (ACIPHEX) 20 MG tablet Take 1 tablet (20 mg total) by mouth daily.  30 tablet  1  . RABEprazole (ACIPHEX) 20 MG tablet Take 1 tablet (20 mg total) by mouth daily.  30 tablet  5  . RABEprazole (ACIPHEX) 20 MG tablet Take 1 tablet (20 mg total) by mouth daily.  30 tablet  5  . nitroGLYCERIN  (NITROSTAT) 0.4 MG SL tablet Place 1 tablet (0.4 mg total) under the tongue every 5 (five) minutes as needed for chest pain.  25 tablet  4  . [DISCONTINUED] Tamsulosin HCl (FLOMAX) 0.4 MG CAPS Take 1 capsule (0.4 mg total) by mouth daily.  30 capsule  5  . [DISCONTINUED] 0.9 %  sodium chloride infusion         EXAM:  BP 140/80  Pulse 82  Temp 98.4 F (36.9 C) (Oral)  Wt 205 lb (92.987 kg)  SpO2 96%  There is no height on file to calculate BMI.  GENERAL: vitals reviewed and listed above, alert, oriented, appears well hydrated and in no acute distress He is nonicteric HEENT: atraumatic, conjunctiva  clear, no obvious abnormalities on inspection of external nose and ears  NECK: no obvious masses on inspection palpation  LUNGS: clear to auscultation bilaterally, no wheezes, rales or rhonchi, good air movement CV: HRRR, no clubbing cyanosis or  peripheral edema nl cap refill  Abd : bs na tender RUQ no reound voluntary guard no mass   No rlq pain  MS: moves all extremities without noticeable focal  abnormality PSYCH: pleasant and cooperative, no obvious depression or anxiety Wt Readings from Last 3 Encounters:  01/22/13 205 lb (92.987 kg)  09/02/12 203 lb (92.08 kg)  04/16/12 207 lb (93.895 kg)   reveiwed record had abd mri and ercp done last spring  Pancreas ok has liver cyst and no gallstones seen  ? If ever had bilitary scan.   ASSESSMENT AND PLAN:  Discussed the following assessment and plan:  1. Right lateral abdominal pain  POCT urinalysis dipstick  2. Atherosclerosis    3. CAD    4. HYPERTENSION    5. HYPERLIPIDEMIA    6. Hepatic cyst     urinalysis is clear today does not appear to be urinary pain renal stone etc. Not felt to be biliary last year but need more input from Gi as to opinion about cause of pain.  -Patient advised to return or notify health care team  if symptoms worsen or persist or new concerns arise.  Patient Instructions  Uncertain cause of  your  chronic   abd pain ; Agree still seems like gallbladder but had neg evaluation last year consider biliary scan but would  like Gi consultants to be involved in work up regarding this worsening pain .  In the mean time avoid fatty foods .   Urine is clear today no evidence of kidney stone.  Someone will contact you about either another scanner getting back to GI.  Neta Mends. Panosh M.D.

## 2013-01-22 NOTE — Patient Instructions (Addendum)
Uncertain cause of  your chronic   abd pain ; Agree still seems like gallbladder but had neg evaluation last year consider biliary scan but would like Gi consultants to be involved in work up regarding this worsening pain .  In the mean time avoid fatty foods .   Urine is clear today no evidence of kidney stone.  Someone will contact you about either another scanner getting back to GI.

## 2013-01-24 ENCOUNTER — Telehealth: Payer: Self-pay | Admitting: Cardiovascular Disease

## 2013-01-24 ENCOUNTER — Telehealth: Payer: Self-pay | Admitting: Internal Medicine

## 2013-01-24 NOTE — Telephone Encounter (Signed)
PT'S WIFE WAS GIVEN NAME AND NUMBER  PER  LAST OFFICE NOTE THAT DR Eden Emms SUGGESTED DR ROSENBOWER./CY

## 2013-01-24 NOTE — Telephone Encounter (Signed)
Ok  . Has right hernia known.

## 2013-01-24 NOTE — Telephone Encounter (Signed)
Pt hus will be getting an appt with Dr Abbey Chatters ?hernia per Dr Eden Emms. FYI

## 2013-01-24 NOTE — Telephone Encounter (Signed)
Pt needs to know what was the surgeon Dr. Eden Emms told him about for his hernia he is a friend of his

## 2013-01-27 ENCOUNTER — Encounter: Payer: Self-pay | Admitting: Internal Medicine

## 2013-01-29 ENCOUNTER — Other Ambulatory Visit: Payer: Self-pay | Admitting: Physician Assistant

## 2013-01-29 DIAGNOSIS — D485 Neoplasm of uncertain behavior of skin: Secondary | ICD-10-CM | POA: Diagnosis not present

## 2013-01-29 DIAGNOSIS — L820 Inflamed seborrheic keratosis: Secondary | ICD-10-CM | POA: Diagnosis not present

## 2013-01-29 DIAGNOSIS — D235 Other benign neoplasm of skin of trunk: Secondary | ICD-10-CM | POA: Diagnosis not present

## 2013-01-29 DIAGNOSIS — L259 Unspecified contact dermatitis, unspecified cause: Secondary | ICD-10-CM | POA: Diagnosis not present

## 2013-01-31 ENCOUNTER — Ambulatory Visit (INDEPENDENT_AMBULATORY_CARE_PROVIDER_SITE_OTHER): Payer: Medicare Other | Admitting: General Surgery

## 2013-01-31 ENCOUNTER — Encounter (INDEPENDENT_AMBULATORY_CARE_PROVIDER_SITE_OTHER): Payer: Self-pay | Admitting: General Surgery

## 2013-01-31 VITALS — BP 132/84 | HR 74 | Temp 97.8°F | Resp 18 | Ht 69.0 in | Wt 202.0 lb

## 2013-01-31 DIAGNOSIS — K4091 Unilateral inguinal hernia, without obstruction or gangrene, recurrent: Secondary | ICD-10-CM

## 2013-01-31 DIAGNOSIS — R1011 Right upper quadrant pain: Secondary | ICD-10-CM | POA: Diagnosis not present

## 2013-01-31 NOTE — Patient Instructions (Signed)

## 2013-01-31 NOTE — Progress Notes (Signed)
Patient ID: [name], male   DOB: [birth year], 72 y.o.   MRN: [id number]  Chief Complaint  Patient presents with  . Inguinal Hernia    HPI [name] is a 72 y.o. male.   HPI 72 yo WM referred by Dr Fabian Sharp for evaluation of right sided abdominal pain. The patient states that he has had right-sided abdominal pain for about a year. Last spring he underwent an exhaustive workup by the gastroenterologist. He had a CT scan of his abdomen which prompted an MRI and MRCP. He also had an upper endoscopy by Dr. Christella Hartigan which revealed mild gastritis. He states that he had an ultrasound of his abdomen which was normal however I cannot find any records of that. He states initially last year his pain wasn't that bad however over the past several weeks it has gotten worse. It is associated with nausea. He states that his pain is mostly dull discomfort. It initially was more intermittent but now it is becoming more constant. He states that typically his pain is about a 3-4/10. When he has an increase in the discomfort he gets as high as a 6/10. His discomfort has been bothering him more recently especially over the past 2 weeks. He denies any jaundice or NSAID use. He has a bowel movement every day. He denies any weight loss. He states that his discomfort is worse in the morning as well as after eating french fries, fatty greasy foods, and ice cream. He denies any melena or hematochezia. He denies any acholic stools. The CT scan of his abdomen showed a recurrent right inguinal hernia as well as a little lobular change to his pancreas which prompted the MRI and MRCP. The MRCP confirmed just normal anatomical variant of his pancreas-there is no evidence of lesion or tumor.  The patient states he has had 5 previous hernia repairs on the right. He's had 2 repairs on the left. His last repair on the right was about 20 years ago. He states at that time he was told that his bladder was pushing through the opening  and they had to call in another surgeon to stitch up a hole in the bladder. He states that the surgeon commented that there simply wasn't any good tissue for them to repair the hernia defect with. He denies any inguinal discomfort Past Medical History  Diagnosis Date  . 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 VITAMIN D DEFICIENCY 11/02/2009  . HYPERLIPIDEMIA 07/25/2007  . HYPERTENSION 07/23/2007  . CAD 01/22/2009  . Atrial fibrillation 07/06/2009  . 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  . RHINITIS, CHRONIC 05/11/2010  . ALLERGIC RHINITIS 07/23/2007  . GERD 07/25/2007  . HERNIA 07/06/2009  . 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 BENIGN PROSTATIC HYPERTROPHY 07/25/2007  . BACK PAIN, LUMBAR 10/25/2010  . LUMBAR RADICULOPATHY, RIGHT 10/18/2007  . BACK PAIN 11/02/2009  . MUSCLE PAIN 01/22/2009  . NEURITIS 10/25/2010  . DIZZINESS 01/22/2009  . Palpitations 05/09/2010  . DYSPNEA ON EXERTION 08/21/2008  . Cough 10/25/2010  . RUQ PAIN 01/22/2009  . HYPERGLYCEMIA, BORDERLINE 12/31/2007  . ECHOCARDIOGRAM, ABNORMAL 08/19/2007  . UNS ADVRS EFF OTH RX MEDICINAL&BIOLOGICAL SBSTNC 04/15/2008  . HX, PERSONAL, MALIGNANCY, SKIN NEC 10/18/2007  . COLONIC POLYPS, HX OF 10/18/2007  . Sinus polyp   . Voice strain   . Arthritis   . Cancer     skin  . Nausea   . Inguinal hernia     Past Surgical History  Procedure Date  . Hernia repair  7 times    inguinal  . Coronary artery bypass graft 2009  . Bony pelvis surgery   . Basal cell and melanoma removed     face  . Tonsillectomy   . Back surgery   . Colonoscopy   . Cataract extraction     BIL    Family History  Problem Relation Age of Onset  . Lung cancer Mother   . Cancer Mother   . Heart attack Father   . Hypertension Father   . Alcohol abuse Father   . Heart disease Father   . Breast cancer Sister   . Cancer Sister     breast  . Alcohol abuse Brother   . Hypertension Brother   . Alcohol abuse Brother   . Hypertension Brother   . Alcohol abuse Brother   .  Hypertension Brother   . Kidney disease Sister     Social History History  Substance Use Topics  . Smoking status: Former Smoker    Types: Cigarettes    Quit date: 04/02/1964  . Smokeless tobacco: Never Used     Comment: in early 20's he quit  . Alcohol Use: No     Comment: socially    Allergies  Allergen Reactions  . Propoxyphene-Acetaminophen   . Iodinated Diagnostic Agents Swelling    Pt developed slight lt upper lip swelling only to one side about 15 minutes post IV contrast injection. No other symptoms.     Current Outpatient Prescriptions  Medication Sig Dispense Refill  . aspirin 81 MG tablet Take 81 mg by mouth daily.        Marland Kitchen atorvastatin (LIPITOR) 40 MG tablet 1 every other day  45 tablet  6  . clobetasol (OLUX) 0.05 % topical foam Apply topically 2 (two) times daily. [facility]      . co-enzyme Q-10 30 MG capsule Take 300 mg by mouth daily.       . metoprolol (LOPRESSOR) 50 MG tablet Take 1/2 bid  90 tablet  3  . MULTIPLE VITAMIN PO Take 1 tablet by mouth daily.       . nitroGLYCERIN (NITROSTAT) 0.4 MG SL tablet Place 1 tablet (0.4 mg total) under the tongue every 5 (five) minutes as needed for chest pain.  25 tablet  4  . NON FORMULARY Joint capsule 1 tab po qd      . Omega-3 Fatty Acids (OMEGA 3 PO) Take 1 capsule by mouth daily.      . RABEprazole (ACIPHEX) 20 MG tablet Take 1 tablet (20 mg total) by mouth daily.  30 tablet  1    Review of Systems Review of Systems  Constitutional: Negative for fever, chills, appetite change and unexpected weight change.  HENT: Negative for congestion and trouble swallowing.   Eyes: Negative for visual disturbance.  Respiratory: Negative for chest tightness and shortness of breath.   Cardiovascular: Negative for chest pain and leg swelling.       No PND, no orthopnea, no DOE  Gastrointestinal:       See HPI  Genitourinary: Negative for dysuria and hematuria.  Musculoskeletal: Negative.   Skin: Negative for rash.   Neurological: Negative for dizziness, seizures, speech difficulty and light-headedness.  Hematological: Does not bruise/bleed easily.  Psychiatric/Behavioral: Negative for behavioral problems and confusion.    Blood pressure 132/84, pulse 74, temperature 97.8 F (36.6 C), temperature source Temporal, resp. rate 18, height 5\' 9"  (1.753 m), weight 202 lb (91.627 kg).  Physical Exam Physical Exam  Vitals reviewed. Constitutional: He is oriented to person, place, and time. He appears well-developed and well-nourished. No distress.  HENT:  Head: Normocephalic and atraumatic.  Right Ear: External ear normal.  Left Ear: External ear normal.  Eyes: Conjunctivae normal are normal. No scleral icterus.  Neck: Neck supple. No thyromegaly present.  Cardiovascular: Normal rate, regular rhythm and normal heart sounds.   Pulmonary/Chest: Effort normal and breath sounds normal. No stridor. No respiratory distress. He has no wheezes.  Abdominal: Soft. He exhibits no distension. There is no tenderness. There is no rebound. A hernia is present. Hernia confirmed positive in the right inguinal area.  Musculoskeletal: He exhibits no edema.  Neurological: He is alert and oriented to person, place, and time.  Skin: Skin is warm and dry. No rash noted. He is not diaphoretic. No erythema. No pallor.  Psychiatric: He has a normal mood and affect. His behavior is normal. Judgment and thought content normal.    Data Reviewed Dr Fabio Bering note from 2013 Dr Fabian Sharp note from 12/2012 Dr Christella Hartigan note from 02/2012 EGD report 03/2012 - mild gastritis Ct abd/pelvis MRI/MRCP  Assessment    RUQ pain associated nausea Recurrent right inguinal hernia    Plan    I do not believe his recurrent right inguinal hernia is the source of his right upper quadrant pain and discomfort. I believe his gallbladder is the source of his discomfort. His symptoms of right upper quadrant pain with nausea as well as the fact that it is  worsened with fatty greasy foods is consistent with Gallbladder disease for me.  We discussed getting a HIDA scan. We discussed the pros and cons of getting that scan. My concern is if it is negative I would still recommend cholecystectomy given the fact that he has had a negative upper endoscopy, CT scan, and MRI.  Therefore we agreed and the patient elected to proceed with a laparoscopic cholecystectomy with interoperative cholangiogram  I believe the patient's symptoms are consistent with gallbladder disease.  We discussed gallbladder disease. The patient was given Agricultural engineer. We discussed non-operative and operative management. We discussed the signs & symptoms of acute cholecystitis  I discussed laparoscopic cholecystectomy with IOC in detail.  The patient was given educational material as well as diagrams detailing the procedure.  We discussed the risks and benefits of a laparoscopic cholecystectomy including, but not limited to bleeding, infection, injury to surrounding structures such as the intestine or liver, bile leak, retained gallstones, need to convert to an open procedure, prolonged diarrhea, blood clots such as  DVT, common bile duct injury, anesthesia risks, and possible need for additional procedures.  We discussed the typical post-operative recovery course. I explained that the likelihood of improvement of their symptoms is fair to good.  Since he has had so many prior right inguinal hernia repairs as well as the fact that cholecystectomy is considered a clean contaminated procedure I did not recommend a simultaneous right inguinal hernia repair. However I did tell the patient and his wife I would look into his groin with the laparoscope to get a better sense of his right groin and possible scar tissue  Mary Sella. Andrey Campanile, MD, FACS General, Bariatric, & Minimally Invasive Surgery [facility], [place]         [facility] M 01/31/2013, 10:31 AM

## 2013-02-03 ENCOUNTER — Telehealth: Payer: Self-pay | Admitting: Cardiovascular Disease

## 2013-02-03 NOTE — Telephone Encounter (Signed)
New Problem:    Patient's wife called in wanting to know if her husband needs to have a sooner appointment to be cleared to have gall bladder surgery.  Please call back.

## 2013-02-03 NOTE — Telephone Encounter (Signed)
PT NEEDING TO HAVE GALLBLADDER SURGERY   WILL FORWARD TO  DR Eden Emms FOR REVIEW .[name]

## 2013-02-04 NOTE — Telephone Encounter (Signed)
Patent grafts on cath 2/13  [facility] for gallbladder surgery

## 2013-02-05 NOTE — Telephone Encounter (Signed)
MESSAGE SENT TO DR Andrey Campanile VIA [employer] AS WELL AS FAXED HARD COPY TO OFFICE ./CY LEFT MESSAGE ON PT'S WIFE'S PHONE  MAY PROCEED WITH GALLBLADDER SURGERY .Zack Seal

## 2013-02-27 ENCOUNTER — Ambulatory Visit
Admission: RE | Admit: 2013-02-27 | Discharge: 2013-02-27 | Disposition: A | Discharge status: Home / Self Care | Payer: Medicare Other | Source: Ambulatory Visit | Attending: General Surgery | Admitting: General Surgery

## 2013-02-27 ENCOUNTER — Other Ambulatory Visit (INDEPENDENT_AMBULATORY_CARE_PROVIDER_SITE_OTHER): Payer: Self-pay | Admitting: General Surgery

## 2013-02-27 DIAGNOSIS — Z01818 Encounter for other preprocedural examination: Secondary | ICD-10-CM | POA: Diagnosis not present

## 2013-02-27 DIAGNOSIS — Z01811 Encounter for preprocedural respiratory examination: Secondary | ICD-10-CM

## 2013-02-27 DIAGNOSIS — R1011 Right upper quadrant pain: Secondary | ICD-10-CM | POA: Diagnosis not present

## 2013-03-03 ENCOUNTER — Other Ambulatory Visit (INDEPENDENT_AMBULATORY_CARE_PROVIDER_SITE_OTHER): Payer: Self-pay | Admitting: General Surgery

## 2013-03-03 DIAGNOSIS — K4091 Unilateral inguinal hernia, without obstruction or gangrene, recurrent: Secondary | ICD-10-CM | POA: Diagnosis not present

## 2013-03-03 DIAGNOSIS — R1011 Right upper quadrant pain: Secondary | ICD-10-CM | POA: Diagnosis not present

## 2013-03-03 DIAGNOSIS — K824 Cholesterolosis of gallbladder: Secondary | ICD-10-CM

## 2013-03-03 HISTORY — PX: LAPAROSCOPIC CHOLECYSTECTOMY: SUR755

## 2013-03-07 ENCOUNTER — Ambulatory Visit (INDEPENDENT_AMBULATORY_CARE_PROVIDER_SITE_OTHER): Payer: Medicare Other | Admitting: Internal Medicine

## 2013-03-07 ENCOUNTER — Encounter: Payer: Self-pay | Admitting: Internal Medicine

## 2013-03-07 VITALS — BP 150/84 | HR 76 | Temp 98.0°F | Wt 200.0 lb

## 2013-03-07 DIAGNOSIS — R7309 Other abnormal glucose: Secondary | ICD-10-CM | POA: Diagnosis not present

## 2013-03-07 DIAGNOSIS — Z951 Presence of aortocoronary bypass graft: Secondary | ICD-10-CM

## 2013-03-07 DIAGNOSIS — Z9889 Other specified postprocedural states: Secondary | ICD-10-CM | POA: Diagnosis not present

## 2013-03-07 DIAGNOSIS — I10 Essential (primary) hypertension: Secondary | ICD-10-CM | POA: Diagnosis not present

## 2013-03-07 DIAGNOSIS — R739 Hyperglycemia, unspecified: Secondary | ICD-10-CM | POA: Insufficient documentation

## 2013-03-07 DIAGNOSIS — I2510 Atherosclerotic heart disease of native coronary artery without angina pectoris: Secondary | ICD-10-CM | POA: Diagnosis not present

## 2013-03-07 DIAGNOSIS — Z9049 Acquired absence of other specified parts of digestive tract: Secondary | ICD-10-CM | POA: Insufficient documentation

## 2013-03-07 LAB — HEMOGLOBIN A1C: Hgb A1c MFr Bld: 5.8 % (ref 4.6–6.5)

## 2013-03-07 LAB — BASIC METABOLIC PANEL
BUN: 16 mg/dL (ref 6–23)
CO2: 25 mEq/L (ref 19–32)
Chloride: 108 mEq/L (ref 96–112)
Creatinine, Ser: 0.8 mg/dL (ref 0.4–1.5)

## 2013-03-07 LAB — CBC WITH DIFFERENTIAL/PLATELET
Basophils Absolute: 0 10*3/uL (ref 0.0–0.1)
Eosinophils Absolute: 0.9 10*3/uL — ABNORMAL HIGH (ref 0.0–0.7)
MCHC: 34.2 g/dL (ref 30.0–36.0)
MCV: 94.5 fl (ref 78.0–100.0)
Monocytes Absolute: 0.8 10*3/uL (ref 0.1–1.0)
Neutrophils Relative %: 55.8 % (ref 43.0–77.0)
Platelets: 219 10*3/uL (ref 150.0–400.0)
RDW: 13.7 % (ref 11.5–14.6)

## 2013-03-07 NOTE — Patient Instructions (Signed)
Will notify you  of labs when available. Plan follow up depending on   Results. glad the surgery went well!.  Still avoid  sinmple sugars  Processed sweets  .   Sugar drinks to help avoid diabetes.

## 2013-03-07 NOTE — Progress Notes (Signed)
Chief Complaint  Patient presents with  . Follow-up    HPI: Patient comes in followup for multiple medical problems. More specifically in regard to his right upper quadrant back pain he did go ahead with laparoscopic cholecystectomy earlier this week. Surgery went well uncomplicated he states that he has some soreness of his abdomen but his major pain is better. He states the surgeon told him there were no stones but his gallbladder was diseased  He reports that he was told his blood sugar was up during preoperative which wasn't fasting and had had some 20s and doughnuts before the lab work.  His wife just underwent her knee replacement is doing well rehabilitation. No cardiovascular pulmonary symptoms today. ROS: See pertinent positives and negatives per HPI. No unusual bleeding urinary symptoms still has his right inguinal hernia that isn't causing him too much problem ;at this time observation is the plan  Past Medical History  Diagnosis Date  . 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 VITAMIN D DEFICIENCY 11/02/2009  . HYPERLIPIDEMIA 07/25/2007  . HYPERTENSION 07/23/2007  . CAD 01/22/2009  . Atrial fibrillation 07/06/2009  . 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  . RHINITIS, CHRONIC 05/11/2010  . ALLERGIC RHINITIS 07/23/2007  . GERD 07/25/2007  . HERNIA 07/06/2009  . 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 BENIGN PROSTATIC HYPERTROPHY 07/25/2007  . BACK PAIN, LUMBAR 10/25/2010  . LUMBAR RADICULOPATHY, RIGHT 10/18/2007  . BACK PAIN 11/02/2009  . MUSCLE PAIN 01/22/2009  . NEURITIS 10/25/2010  . DIZZINESS 01/22/2009  . Palpitations 05/09/2010  . DYSPNEA ON EXERTION 08/21/2008  . Cough 10/25/2010  . RUQ PAIN 01/22/2009  . HYPERGLYCEMIA, BORDERLINE 12/31/2007  . ECHOCARDIOGRAM, ABNORMAL 08/19/2007  . UNS ADVRS EFF OTH RX MEDICINAL&BIOLOGICAL SBSTNC 04/15/2008  . HX, PERSONAL, MALIGNANCY, SKIN NEC 10/18/2007  . COLONIC POLYPS, HX OF 10/18/2007  . Sinus polyp   . Voice strain   . Arthritis   . Cancer     skin  .  Nausea   . Inguinal hernia     Family History  Problem Relation Age of Onset  . Lung cancer Mother   . Cancer Mother   . Heart attack Father   . Hypertension Father   . Alcohol abuse Father   . Heart disease Father   . Breast cancer Sister   . Cancer Sister     breast  . Alcohol abuse Brother   . Hypertension Brother   . Alcohol abuse Brother   . Hypertension Brother   . Alcohol abuse Brother   . Hypertension Brother   . Kidney disease Sister     History   Social History  . Marital Status: Married    Spouse Name: N/A    Number of Children: N/A  . Years of Education: N/A   Social History Main Topics  . Smoking status: Former Smoker    Types: Cigarettes    Quit date: 04/02/1964  . Smokeless tobacco: Never Used     Comment: in early 20's he quit  . Alcohol Use: No     Comment: socially  . Drug Use: No  . Sexually Active: None   Other Topics Concern  . None   Social History Narrative   Retired [employer]   Married   Non smoker some etoh   Travels a lot             Outpatient Encounter Prescriptions as of 03/07/2013  Medication Sig Dispense Refill  . aspirin 81 MG tablet Take 81 mg by mouth  daily.        . atorvastatin (LIPITOR) 40 MG tablet 1 every other day  45 tablet  6  . clobetasol (OLUX) 0.05 % topical foam Apply topically 2 (two) times daily. [facility]      . co-enzyme Q-10 30 MG capsule Take 300 mg by mouth daily.       . metoprolol (LOPRESSOR) 50 MG tablet Take 1/2 bid  90 tablet  3  . MULTIPLE VITAMIN PO Take 1 tablet by mouth daily.       . NON FORMULARY Joint capsule 1 tab po qd      . Omega-3 Fatty Acids (OMEGA 3 PO) Take 1 capsule by mouth daily.      . RABEprazole (ACIPHEX) 20 MG tablet Take 1 tablet (20 mg total) by mouth daily.  30 tablet  1  . nitroGLYCERIN (NITROSTAT) 0.4 MG SL tablet Place 1 tablet (0.4 mg total) under the tongue every 5 (five) minutes as needed for chest pain.  25 tablet  4   No facility-administered encounter  medications on file as of 03/07/2013.    EXAM:  BP 150/84  Pulse 76  Temp(Src) 98 F (36.7 C) (Oral)  Wt 200 lb (90.719 kg)  BMI 29.52 kg/m2  SpO2 96%  Body mass index is 29.52 kg/(m^2).  GENERAL: vitals reviewed and listed above, alert, oriented, appears well hydrated and in no acute distress looks well nonicteric  HEENT: atraumatic, conjunctiva  clear, no obvious abnormalities on inspection of external nose and ears NECK: no obvious masses on inspection palpation   LUNGS: clear to auscultation bilaterally, no wheezes, rales or rhonchi, good air movement  CV: HRRR, no clubbing cyanosis l cap refill  Abdomen Steri-Strips over laparoscopic scars otherwise normal normal bowel sounds no guarding rebound or hepatomegaly. MS: moves all extremities without noticeable focal  abnormality  PSYCH: pleasant and cooperative, no obvious depression or anxiety  ASSESSMENT AND PLAN:  Discussed the following assessment and plan:  Hyperglycemia - Apparently elevated in preoperative cannot find these results in the electronic health record - Plan: Basic metabolic panel, CBC with Differential, Hemoglobin A1c  HYPERTENSION - Plan: Basic metabolic panel, CBC with Differential, Hemoglobin A1c  HYPERGLYCEMIA, BORDERLINE - Plan: Basic metabolic panel, CBC with Differential, Hemoglobin A1c  Status post laparoscopic cholecystectomy - So far appears to his help is right upper quadrant pain.  -Patient advised to return or notify health care team  if symptoms worsen or persist or new concerns arise.  Patient Instructions  Will notify you  of labs when available. Plan follow up depending on   Results. glad the surgery went well!.  Still avoid  sinmple sugars  Processed sweets  .   Sugar drinks to help avoid diabetes.       Neta Mends. Panosh M.D.

## 2013-03-13 ENCOUNTER — Ambulatory Visit (INDEPENDENT_AMBULATORY_CARE_PROVIDER_SITE_OTHER): Payer: Medicare Other | Admitting: General Surgery

## 2013-03-13 ENCOUNTER — Encounter (INDEPENDENT_AMBULATORY_CARE_PROVIDER_SITE_OTHER): Payer: Self-pay | Admitting: General Surgery

## 2013-03-13 VITALS — BP 108/70 | HR 80 | Resp 14 | Ht 69.0 in | Wt 201.0 lb

## 2013-03-13 DIAGNOSIS — R3915 Urgency of urination: Secondary | ICD-10-CM | POA: Diagnosis not present

## 2013-03-13 DIAGNOSIS — R350 Frequency of micturition: Secondary | ICD-10-CM | POA: Diagnosis not present

## 2013-03-13 MED ORDER — TAMSULOSIN HCL 0.4 MG PO CAPS: 0.4000 mg | ORAL_CAPSULE | Freq: Every day | ORAL | Status: DC

## 2013-03-13 NOTE — Patient Instructions (Addendum)
The tenderness at your upper incision should get better with time. There is no sign of infection. We will check your urine for signs of infection You can take Flomax for urine issues of urgency and frequency. Your prescription will be sent to your pharmacy. If your symptoms persist please contact your primary care physician Please call when you're ready to proceed with hernia repair

## 2013-03-13 NOTE — Progress Notes (Signed)
Subjective:     Patient ID: [name], male   DOB: 12-Oct-1941, 72 y.o.   MRN: [id number]  HPI 72 year old Caucasian male comes in for followup after undergoing laparoscopic cholecystectomy with interoperative Cholangiogram on March 10 at the surgical Center [facility] for right upper quadrant pain. He denies any fevers or chills. He denies any nausea or vomiting. He states that the right-sided abdominal pain has resolved. He reports a good appetite. He states that his bladder is super sensitive. He reports increased frequency and urgency. He also complains of some intermittent burning. He also states that one of his incisions is a little bit sensitive.  Review of Systems     Objective:   Physical Exam BP 108/70  Pulse 80  Resp 14  Ht 5\' 9"  (1.753 m)  Wt 201 lb (91.173 kg)  BMI 29.67 kg/m2  Gen: alert, NAD, non-toxic appearing Pupils: equal, no scleral icterus Pulm: Lungs clear to auscultation, symmetric chest rise CV: regular rate and rhythm Abd: soft, nontender, nondistended. Well-healed trocar sites. No cellulitis. No incisional hernia. A little swelling at epigastric trocar site (1.5cm) - no induration, fluctuance.  Skin: no rash, no jaundice     Assessment:     Status post laparoscopic cholecystectomy with interoperative cholangiogram for right upper quadrant pain  Recurrent right direct inguinal hernia    Plan:     During surgery, I did visualize the right groin laparoscopically. He does have a recurrent direct right sided inguinal hernia. There is no evidence of contralateral inguinal hernia. He has had 5 prior repairs on the right. Despite this it does appear that his inguinal hernia is amenable to a laparoscopic repair. I advised him to contact our office when he wants to proceed with repair   With respect to his urinary urgency and frequency, he did not get a Foley catheter during surgery. Nonetheless I will order a UA on him to rule out a urinary tract infection.  This could be symptoms of BPH. I gave him a prescription for Flomax. If his urinalysis is normal and he continues to have symptoms I advised him he needed to contact his primary care physician.  We discussed his pathology report and he was given a copy of it. His pathology report showed cholesterolosis.  Followup as needed or when he would like to proceed with inguinal hernia repair  Mary Sella. Andrey Campanile, MD, FACS General, Bariatric, & Minimally Invasive Surgery [facility], [place]

## 2013-03-14 ENCOUNTER — Telehealth (INDEPENDENT_AMBULATORY_CARE_PROVIDER_SITE_OTHER): Payer: Self-pay | Admitting: General Surgery

## 2013-03-14 LAB — URINALYSIS, ROUTINE W REFLEX MICROSCOPIC
Hgb urine dipstick: NEGATIVE
Leukocytes, UA: NEGATIVE
Nitrite: NEGATIVE
Protein, ur: NEGATIVE mg/dL
pH: 5.5 (ref 5.0–8.0)

## 2013-03-14 NOTE — Telephone Encounter (Signed)
Pt returned call and advised per note in epic by [facility].

## 2013-03-14 NOTE — Telephone Encounter (Signed)
Message copied by Liliana Cline on Fri Mar 14, 2013  9:07 AM ------      Message from: Andrey Campanile, ERIC M      Created: Fri Mar 14, 2013  7:25 AM       pls call pt - UA was normal. Cont taking flomax (tamulosin) if urinary symptoms persists need to see PCP ------

## 2013-03-14 NOTE — Telephone Encounter (Signed)
Left message on machine for patient to call back and ask for me. To make him aware that UA is normal, to continue taking flomax and see PCP if symptoms persist.

## 2013-03-25 ENCOUNTER — Ambulatory Visit (INDEPENDENT_AMBULATORY_CARE_PROVIDER_SITE_OTHER): Payer: Medicare Other | Admitting: Cardiovascular Disease

## 2013-03-25 ENCOUNTER — Encounter: Payer: Self-pay | Admitting: Cardiovascular Disease

## 2013-03-25 VITALS — BP 115/80 | HR 77 | Wt 201.0 lb

## 2013-03-25 DIAGNOSIS — I2510 Atherosclerotic heart disease of native coronary artery without angina pectoris: Secondary | ICD-10-CM | POA: Diagnosis not present

## 2013-03-25 DIAGNOSIS — E785 Hyperlipidemia, unspecified: Secondary | ICD-10-CM

## 2013-03-25 DIAGNOSIS — I25810 Atherosclerosis of coronary artery bypass graft(s) without angina pectoris: Secondary | ICD-10-CM | POA: Diagnosis not present

## 2013-03-25 DIAGNOSIS — I10 Essential (primary) hypertension: Secondary | ICD-10-CM

## 2013-03-25 DIAGNOSIS — I4891 Unspecified atrial fibrillation: Secondary | ICD-10-CM

## 2013-03-25 NOTE — Progress Notes (Signed)
Patient ID: [name], male   DOB: 09/22/1941, 72 y.o.   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 He has leg cramps with most statins. He is tolerating lipitor qod with an LDL of 82 He has prostate issues and flomax is working fairly well.He has had recurrent hernias in the past and is having recurrent issues on the right. I gave hiim Dr Maris Berger name as a referral. Otherwise he has been compliant with his meds and walks on a daily basis. He had some palpitations and Event monitor was benign.  Had some chest pain first part of January One episode after eating ? Indigestion lasted less than a minute. 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  SSCP cath 2/13 Arida Patent grafts. Lesion in small unbypassed OM Rx medically   Had lap choly with Dr Andrey Campanile earlier this month.  Chronic pain better and hernias ok for now  ROS: Denies fever, malais, weight loss, blurry vision, decreased visual acuity, cough, sputum, SOB, hemoptysis, pleuritic pain, palpitaitons, heartburn, abdominal pain, melena, lower extremity edema, claudication, or rash.  All other systems reviewed and negative  General: Affect appropriate Healthy:  appears stated age HEENT: normal Neck supple with no adenopathy JVP normal no bruits no thyromegaly Lungs clear with no wheezing and good diaphragmatic motion Heart:  S1/S2 no murmur, no rub, gallop or click PMI normal Abdomen: benighn, BS positve, no tenderness, no AAA sp lap choly no bruit.  No HSM or HJR Distal pulses intact with no bruits No edema Neuro non-focal Skin warm and dry No muscular weakness   Current Outpatient Prescriptions  Medication Sig Dispense Refill  . aspirin 81 MG tablet Take 81 mg by mouth daily.        Marland Kitchen atorvastatin (LIPITOR) 40 MG  tablet 1 every other day  45 tablet  6  . clobetasol (OLUX) 0.05 % topical foam Apply topically 2 (two) times daily. [facility]      . co-enzyme Q-10 30 MG capsule Take 300 mg by mouth daily.       . metoprolol (LOPRESSOR) 50 MG tablet Take 1/2 bid  90 tablet  3  . MULTIPLE VITAMIN PO Take 1 tablet by mouth daily.       . nitroGLYCERIN (NITROSTAT) 0.4 MG SL tablet Place 1 tablet (0.4 mg total) under the tongue every 5 (five) minutes as needed for chest pain.  25 tablet  4  . NON FORMULARY Joint capsule 1 tab po qd      . Omega-3 Fatty Acids (OMEGA 3 PO) Take 1 capsule by mouth daily.      . RABEprazole (ACIPHEX) 20 MG tablet Take 1 tablet (20 mg total) by mouth daily.  30 tablet  1  . tamsulosin (FLOMAX) 0.4 MG CAPS Take 1 capsule (0.4 mg total) by mouth daily.  30 capsule  0   No current facility-administered medications for this visit.    Allergies  Propoxyphene-acetaminophen and Iodinated diagnostic agents  Electrocardiogram:  SR rate 77 PR 214 [id number]  Assessment and Plan

## 2013-03-25 NOTE — Assessment & Plan Note (Signed)
Well controlled.  Continue current medications and low sodium Dash type diet.    

## 2013-03-25 NOTE — Assessment & Plan Note (Signed)
Maint NSR with no palpitations Continue beta blocker

## 2013-03-25 NOTE — Assessment & Plan Note (Signed)
Stable with no angina and good activity level.  Continue medical Rx  

## 2013-03-25 NOTE — Patient Instructions (Signed)
Your physician wants you to follow-up in: YEAR WITH DR NISHAN  You will receive a reminder letter in the mail two months in advance. If you don't receive a letter, please call our office to schedule the follow-up appointment.  Your physician recommends that you continue on your current medications as directed. Please refer to the Current Medication list given to you today. 

## 2013-03-25 NOTE — Assessment & Plan Note (Signed)
Cholesterol is at goal.  Continue current dose of statin and diet Rx.  No myalgias or side effects.  F/U  LFT's in 6 months. Lab Results  Component Value Date   LDLCALC 88 09/03/2012

## 2013-03-27 ENCOUNTER — Telehealth: Payer: Self-pay | Admitting: Cardiovascular Disease

## 2013-03-27 NOTE — Telephone Encounter (Signed)
Called patient's wife. She bought generic Claritin that does NOT have Sudafed so I advised her that this is OK for him to take as needed.

## 2013-03-27 NOTE — Telephone Encounter (Signed)
New problem   Can pt take allerclear for allergies

## 2013-03-31 ENCOUNTER — Encounter: Payer: Self-pay | Admitting: Internal Medicine

## 2013-03-31 ENCOUNTER — Ambulatory Visit (INDEPENDENT_AMBULATORY_CARE_PROVIDER_SITE_OTHER): Payer: Medicare Other | Admitting: Internal Medicine

## 2013-03-31 VITALS — BP 144/80 | HR 71 | Temp 99.0°F | Wt 204.0 lb

## 2013-03-31 DIAGNOSIS — L089 Local infection of the skin and subcutaneous tissue, unspecified: Secondary | ICD-10-CM

## 2013-03-31 DIAGNOSIS — L723 Sebaceous cyst: Secondary | ICD-10-CM

## 2013-03-31 MED ORDER — DOXYCYCLINE HYCLATE 100 MG PO CAPS: 100.0000 mg | ORAL_CAPSULE | Freq: Two times a day (BID) | ORAL | Status: DC

## 2013-03-31 NOTE — Progress Notes (Signed)
Chief Complaint  Patient presents with  . Cyst    Come up 5 days ago.  Is not leaking and has a bad smell.    HPI: Patient comes in today for SDA for  new problem evaluation. Has a history of a cyst on his left back since he was younger. It occasionally flares up and drains it gets better however this time it has gotten bigger than usual and the discharge has a bad odor. No fever it is sore and red. ROS: See pertinent positives and negatives per HPI. No more significant right upper quadrant pain and chest symptoms pulmonary symptoms wife is still recovering from joint replacement surgery.  Past Medical History  Diagnosis Date  . 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 VITAMIN D DEFICIENCY 11/02/2009  . HYPERLIPIDEMIA 07/25/2007  . HYPERTENSION 07/23/2007  . CAD 01/22/2009  . Atrial fibrillation 07/06/2009  . 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  . RHINITIS, CHRONIC 05/11/2010  . ALLERGIC RHINITIS 07/23/2007  . GERD 07/25/2007  . HERNIA 07/06/2009  . 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 BENIGN PROSTATIC HYPERTROPHY 07/25/2007  . BACK PAIN, LUMBAR 10/25/2010  . LUMBAR RADICULOPATHY, RIGHT 10/18/2007  . BACK PAIN 11/02/2009  . MUSCLE PAIN 01/22/2009  . NEURITIS 10/25/2010  . DIZZINESS 01/22/2009  . Palpitations 05/09/2010  . DYSPNEA ON EXERTION 08/21/2008  . Cough 10/25/2010  . RUQ PAIN 01/22/2009  . HYPERGLYCEMIA, BORDERLINE 12/31/2007  . ECHOCARDIOGRAM, ABNORMAL 08/19/2007  . UNS ADVRS EFF OTH RX MEDICINAL&BIOLOGICAL SBSTNC 04/15/2008  . HX, PERSONAL, MALIGNANCY, SKIN NEC 10/18/2007  . COLONIC POLYPS, HX OF 10/18/2007  . Sinus polyp   . Voice strain   . Arthritis   . Cancer     skin  . Nausea   . Inguinal hernia     Family History  Problem Relation Age of Onset  . Lung cancer Mother   . Cancer Mother   . Heart attack Father   . Hypertension Father   . Alcohol abuse Father   . Heart disease Father   . Breast cancer Sister   . Cancer Sister     breast  . Alcohol abuse Brother   . Hypertension  Brother   . Alcohol abuse Brother   . Hypertension Brother   . Alcohol abuse Brother   . Hypertension Brother   . Kidney disease Sister     History   Social History  . Marital Status: Married    Spouse Name: N/A    Number of Children: N/A  . Years of Education: N/A   Social History Main Topics  . Smoking status: Former Smoker    Types: Cigarettes    Quit date: 04/02/1964  . Smokeless tobacco: Never Used     Comment: in early 20's he quit  . Alcohol Use: No     Comment: socially  . Drug Use: No  . Sexually Active: None   Other Topics Concern  . None   Social History Narrative   Retired [employer]   Married   Non smoker some etoh   Travels a lot             Outpatient Encounter Prescriptions as of 03/31/2013  Medication Sig Dispense Refill  . aspirin 81 MG tablet Take 81 mg by mouth daily.        Marland Kitchen atorvastatin (LIPITOR) 40 MG tablet 1 every other day  45 tablet  6  . clobetasol (OLUX) 0.05 % topical foam Apply topically 2 (two) times daily. [facility]      .  co-enzyme Q-10 30 MG capsule Take 300 mg by mouth daily.       . metoprolol (LOPRESSOR) 50 MG tablet Take 1/2 bid  90 tablet  3  . MULTIPLE VITAMIN PO Take 1 tablet by mouth daily.       . NON FORMULARY Joint capsule 1 tab po qd      . Omega-3 Fatty Acids (OMEGA 3 PO) Take 1 capsule by mouth daily.      . RABEprazole (ACIPHEX) 20 MG tablet Take 1 tablet (20 mg total) by mouth daily.  30 tablet  1  . tamsulosin (FLOMAX) 0.4 MG CAPS Take 1 capsule (0.4 mg total) by mouth daily.  30 capsule  0  . doxycycline (VIBRAMYCIN) 100 MG capsule Take 1 capsule (100 mg total) by mouth 2 (two) times daily.  14 capsule  0  . nitroGLYCERIN (NITROSTAT) 0.4 MG SL tablet Place 1 tablet (0.4 mg total) under the tongue every 5 (five) minutes as needed for chest pain.  25 tablet  4   No facility-administered encounter medications on file as of 03/31/2013.    EXAM:  BP 144/80  Pulse 71  Temp(Src) 99 F (37.2 C) (Oral)  Wt  204 lb (92.534 kg)  BMI 30.11 kg/m2  SpO2 94%  Body mass index is 30.11 kg/(m^2).  GENERAL: vitals reviewed and listed above, alert, oriented, appears well hydrated and in no acute distress  HEENT: atraumatic, conjunctiva  clear, no obvious abnormalities on inspection of external nose and ears NECK: no obvious masses on inspection palpation   Back there is a large 3 cm area of redness and swelling with Center point. It is tender but not fluctuant.  Manually expressed a very large amount of cystic material  Odiferous  with and expression slightly bloody.  Tolerated it well. No fluctuance. Left some induration.  MS: moves all extremities without noticeable focal  abnormality  PSYCH: pleasant and cooperative, no obvious depression or anxiety  ASSESSMENT AND PLAN:  Discussed the following assessment and plan:  Infected sebaceous cyst - Warm compresses drainage is possible add short course of antibiotics followup if persistent or progressive Expectant management  Face to face  With procedure 25 minutes.  -Patient advised to return or notify health care team  if symptoms worsen or persist or new concerns arise.  Patient Instructions  i agree this is an infected  Cyst  Have  Removed expressed a large amount of  Cyst content  And should get better with warm hot compresses and  5-7 days  of antibiotics .  contact us if not getting better or as needed.         Neta Mends. Kahmari Koller M.D.

## 2013-03-31 NOTE — Patient Instructions (Addendum)
i agree this is an infected  Cyst  Have  Removed expressed a large amount of  Cyst content  And should get better with warm hot compresses and  5-7 days  of antibiotics .  contact us if not getting better or as needed.

## 2013-05-05 ENCOUNTER — Other Ambulatory Visit: Payer: Self-pay | Admitting: Internal Medicine

## 2013-05-07 ENCOUNTER — Other Ambulatory Visit: Payer: Self-pay | Admitting: Internal Medicine

## 2013-05-09 ENCOUNTER — Other Ambulatory Visit: Payer: Self-pay | Admitting: Family Medicine

## 2013-05-09 MED ORDER — TAMSULOSIN HCL 0.4 MG PO CAPS: 0.4000 mg | ORAL_CAPSULE | Freq: Every day | ORAL | Status: DC

## 2013-05-09 MED ORDER — RABEPRAZOLE SODIUM 20 MG PO TBEC: DELAYED_RELEASE_TABLET | ORAL | Status: DC

## 2013-05-23 ENCOUNTER — Telehealth: Payer: Self-pay | Admitting: Internal Medicine

## 2013-05-23 NOTE — Telephone Encounter (Signed)
Ok to do this  [name] should be refilling his nitro but for convenience sake ok for [place] to do it this one time.

## 2013-05-23 NOTE — Telephone Encounter (Signed)
Pt will be going [place] in [place]. Leaving June 8th. Will be there a while and needs WRITTEN prescriptions to take with him so he can fill them while there. He needs these for:  tamsulosin (FLOMAX) 0.4 MG CAPS metoprolol (LOPRESSOR) 50 MG tablet nitroGLYCERIN (NITROSTAT) 0.4 MG SL tablet  (orign rx'd by [facility] - refills expired)   **NEEDS TO PICK THESE UP BY NEXT TUES 6/3 if at all possible.

## 2013-05-23 NOTE — Telephone Encounter (Signed)
Will you refill Nitro?

## 2013-05-27 ENCOUNTER — Other Ambulatory Visit: Payer: Self-pay | Admitting: Family Medicine

## 2013-05-27 MED ORDER — TAMSULOSIN HCL 0.4 MG PO CAPS: 0.4000 mg | ORAL_CAPSULE | Freq: Every day | ORAL | Status: DC

## 2013-05-27 MED ORDER — NITROGLYCERIN 0.4 MG SL SUBL: 0.4000 mg | SUBLINGUAL_TABLET | SUBLINGUAL | Status: DC | PRN

## 2013-05-27 MED ORDER — METOPROLOL TARTRATE 50 MG PO TABS: ORAL_TABLET | ORAL | Status: DC

## 2013-05-27 NOTE — Telephone Encounter (Signed)
Printed and placed at the front desk. Pt notified

## 2013-07-23 ENCOUNTER — Telehealth: Payer: Self-pay | Admitting: Internal Medicine

## 2013-07-23 NOTE — Telephone Encounter (Signed)
Pt is out of town in [place]. Pt needs  New rx rabeprazole 20 mg #30 call into [facility] [place] # [phone].  Pt will make appt if needed

## 2013-07-24 ENCOUNTER — Other Ambulatory Visit: Payer: Self-pay | Admitting: Family Medicine

## 2013-07-24 MED ORDER — RABEPRAZOLE SODIUM 20 MG PO TBEC: DELAYED_RELEASE_TABLET | ORAL | Status: DC

## 2013-07-24 NOTE — Telephone Encounter (Signed)
Sent to the pharmacy by e-scribe.  Pt notified. 

## 2013-07-29 ENCOUNTER — Ambulatory Visit (INDEPENDENT_AMBULATORY_CARE_PROVIDER_SITE_OTHER): Payer: Medicare Other | Admitting: Internal Medicine

## 2013-07-29 ENCOUNTER — Encounter: Payer: Self-pay | Admitting: Internal Medicine

## 2013-07-29 VITALS — BP 126/82 | HR 86 | Temp 98.3°F | Wt 205.0 lb

## 2013-07-29 DIAGNOSIS — N138 Other obstructive and reflux uropathy: Secondary | ICD-10-CM

## 2013-07-29 DIAGNOSIS — N401 Enlarged prostate with lower urinary tract symptoms: Secondary | ICD-10-CM

## 2013-07-29 DIAGNOSIS — I2510 Atherosclerotic heart disease of native coronary artery without angina pectoris: Secondary | ICD-10-CM

## 2013-07-29 DIAGNOSIS — R739 Hyperglycemia, unspecified: Secondary | ICD-10-CM

## 2013-07-29 DIAGNOSIS — I10 Essential (primary) hypertension: Secondary | ICD-10-CM

## 2013-07-29 DIAGNOSIS — E785 Hyperlipidemia, unspecified: Secondary | ICD-10-CM

## 2013-07-29 DIAGNOSIS — R7309 Other abnormal glucose: Secondary | ICD-10-CM | POA: Diagnosis not present

## 2013-07-29 DIAGNOSIS — R21 Rash and other nonspecific skin eruption: Secondary | ICD-10-CM

## 2013-07-29 DIAGNOSIS — K219 Gastro-esophageal reflux disease without esophagitis: Secondary | ICD-10-CM

## 2013-07-29 DIAGNOSIS — N139 Obstructive and reflux uropathy, unspecified: Secondary | ICD-10-CM

## 2013-07-29 DIAGNOSIS — N400 Enlarged prostate without lower urinary tract symptoms: Secondary | ICD-10-CM

## 2013-07-29 DIAGNOSIS — Z951 Presence of aortocoronary bypass graft: Secondary | ICD-10-CM

## 2013-07-29 MED ORDER — METOPROLOL TARTRATE 50 MG PO TABS: ORAL_TABLET | ORAL | Status: DC

## 2013-07-29 MED ORDER — TAMSULOSIN HCL 0.4 MG PO CAPS: 0.4000 mg | ORAL_CAPSULE | Freq: Every day | ORAL | Status: DC

## 2013-07-29 NOTE — Patient Instructions (Signed)
Continue lifestyle intervention healthy eating and exercise . Will notify you  of labs when available. Bu can sign up for MY chart and veiw the results.   Wellness  In 6 months or as needed.

## 2013-07-29 NOTE — Progress Notes (Signed)
Chief Complaint  Patient presents with  . Follow-up  . Hyperlipidemia  . Hypertension  . Coronary Artery Disease    HPI: Patient comes in today for follow up of  multiple medical problems.  Here with wife   traveling with family issues  No change in health but has a persistent itchy rash on lle  Not responsive to topical steroid rx . To fu with derm.  No cp sob  On hb med controlling   cv no neuro change no need for nitroglycerin  GI takes acid suppressive medication   Only ocass upper gi sx after eating since choley.  No numbness weakness  Doing most of driving since wife had knee surgery   Just came in from [place].   Needs refill flomax and metoprolol   Has had palpitation work up with event monitor and found to have no sig arrythmia  No change in sx ocass palp   ROS: See pertinent positives and negatives per HPI. No syncope  No falling o injury  Past Medical History  Diagnosis Date  . 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 VITAMIN D DEFICIENCY 11/02/2009  . HYPERLIPIDEMIA 07/25/2007  . HYPERTENSION 07/23/2007  . CAD 01/22/2009  . Atrial fibrillation 07/06/2009  . 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  . RHINITIS, CHRONIC 05/11/2010  . ALLERGIC RHINITIS 07/23/2007  . GERD 07/25/2007  . HERNIA 07/06/2009  . 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 BENIGN PROSTATIC HYPERTROPHY 07/25/2007  . BACK PAIN, LUMBAR 10/25/2010  . LUMBAR RADICULOPATHY, RIGHT 10/18/2007  . BACK PAIN 11/02/2009  . MUSCLE PAIN 01/22/2009  . NEURITIS 10/25/2010  . DIZZINESS 01/22/2009  . Palpitations 05/09/2010  . DYSPNEA ON EXERTION 08/21/2008  . Cough 10/25/2010  . RUQ PAIN 01/22/2009  . HYPERGLYCEMIA, BORDERLINE 12/31/2007  . ECHOCARDIOGRAM, ABNORMAL 08/19/2007  . UNS ADVRS EFF OTH RX MEDICINAL&BIOLOGICAL SBSTNC 04/15/2008  . HX, PERSONAL, MALIGNANCY, SKIN NEC 10/18/2007  . COLONIC POLYPS, HX OF 10/18/2007  . Sinus polyp   . Voice strain   . Arthritis   . Cancer     skin  . Nausea   . Inguinal hernia   . Abdominal pain,  right lateral 02/21/2012    progressive.      Family History  Problem Relation Age of Onset  . Lung cancer Mother   . Cancer Mother   . Heart attack Father   . Hypertension Father   . Alcohol abuse Father   . Heart disease Father   . Breast cancer Sister   . Cancer Sister     breast  . Alcohol abuse Brother   . Hypertension Brother   . Alcohol abuse Brother   . Hypertension Brother   . Alcohol abuse Brother   . Hypertension Brother   . Kidney disease Sister     History   Social History  . Marital Status: Married    Spouse Name: N/A    Number of Children: N/A  . Years of Education: N/A   Social History Main Topics  . Smoking status: Former Smoker    Types: Cigarettes    Quit date: 04/02/1964  . Smokeless tobacco: Never Used     Comment: in early 20's he quit  . Alcohol Use: No     Comment: socially  . Drug Use: No  . Sexually Active: None   Other Topics Concern  . None   Social History Narrative   Retired [employer]   Married   Non smoker some etoh   Travels a lot  Outpatient Encounter Prescriptions as of 07/29/2013  Medication Sig Dispense Refill  . aspirin 81 MG tablet Take 81 mg by mouth daily.        Marland Kitchen atorvastatin (LIPITOR) 40 MG tablet 1 every other day  45 tablet  6  . clobetasol (OLUX) 0.05 % topical foam Apply topically 2 (two) times daily. [facility]      . co-enzyme Q-10 30 MG capsule Take 300 mg by mouth daily.       . metoprolol (LOPRESSOR) 50 MG tablet Take 1/2 bid  90 tablet  3  . MULTIPLE VITAMIN PO Take 1 tablet by mouth daily.       . nitroGLYCERIN (NITROSTAT) 0.4 MG SL tablet Place 1 tablet (0.4 mg total) under the tongue every 5 (five) minutes as needed for chest pain.  25 tablet  0  . NON FORMULARY Joint capsule 1 tab po qd      . Omega-3 Fatty Acids (OMEGA 3 PO) Take 1 capsule by mouth daily.      . RABEprazole (ACIPHEX) 20 MG tablet TAKE ONE TABLET ORALLY DAILY  90 tablet  0  . tamsulosin (FLOMAX) 0.4 MG CAPS  capsule Take 1 capsule (0.4 mg total) by mouth daily.  90 capsule  3  . [DISCONTINUED] metoprolol (LOPRESSOR) 50 MG tablet Take 1/2 bid  90 tablet  0  . [DISCONTINUED] metoprolol (LOPRESSOR) 50 MG tablet Take 1/2 bid  90 tablet  3  . [DISCONTINUED] tamsulosin (FLOMAX) 0.4 MG CAPS capsule Take 1 capsule (0.4 mg total) by mouth daily.  90 capsule  3  . [DISCONTINUED] tamsulosin (FLOMAX) 0.4 MG CAPS Take 1 capsule (0.4 mg total) by mouth daily.  90 capsule  0  . [DISCONTINUED] doxycycline (VIBRAMYCIN) 100 MG capsule Take 1 capsule (100 mg total) by mouth 2 (two) times daily.  14 capsule  0  . [DISCONTINUED] tamsulosin (FLOMAX) 0.4 MG CAPS Take 0.4 mg by mouth daily.       No facility-administered encounter medications on file as of 07/29/2013.    EXAM:  BP 126/82  Pulse 86  Temp(Src) 98.3 F (36.8 C) (Oral)  Wt 205 lb (92.987 kg)  BMI 30.26 kg/m2  SpO2 94%  Body mass index is 30.26 kg/(m^2).  GENERAL: vitals reviewed and listed above, alert, oriented, appears well hydrated and in no acute distress HEENT: atraumatic, conjunctiva  clear, no obvious abnormalities on inspection of external nose and ears NECK: no obvious masses on inspection palpation  No adenopathy  LUNGS: clear to auscultation bilaterally, no wheezes, rales or rhonchi, good air movement CV: HRRR, no clubbing cyanosis or  peripheral edema nl cap refill  No edema Skin lle  Red ? Flat rash anterior shin lle    No vesicle or edema MS: moves all extremities without noticeable focal  abnormality PSYCH: pleasant and cooperative, no obvious depression or anxiety Lab Results  Component Value Date   WBC 7.2 03/07/2013   HGB 15.7 03/07/2013   HCT 46.0 03/07/2013   PLT 219.0 03/07/2013   GLUCOSE 108* 03/07/2013   CHOL 144 09/03/2012   TRIG 75.0 09/03/2012   HDL 41.10 09/03/2012   LDLDIRECT 187.2 03/31/2008   LDLCALC 88 09/03/2012   ALT 24 09/03/2012   AST 18 09/03/2012   NA 142 03/07/2013   K 4.0 03/07/2013   CL 108 03/07/2013    CREATININE 0.8 03/07/2013   BUN 16 03/07/2013   CO2 25 03/07/2013   TSH 1.11 09/03/2012   PSA 1.88 09/03/2012  INR 1.1* 01/25/2012   HGBA1C 5.8 03/07/2013    ASSESSMENT AND PLAN:  Discussed the following assessment and plan:  HYPERLIPIDEMIA - Plan: Basic metabolic panel, Lipid panel, Hepatic function panel  Hyperglycemia - Plan: Basic metabolic panel, Lipid panel, Hepatic function panel  CAD - Plan: Basic metabolic panel, Lipid panel, Hepatic function panel  HYPERTENSION - Plan: Basic metabolic panel, Lipid panel, Hepatic function panel  GERD  BENIGN PROSTATIC HYPERTROPHY  S/P CABG (coronary artery bypass graft)  Rash and nonspecific skin eruption - persistent  fu derm   BPH with urinary obstruction Lab done NF today  Had 2 donuts and biscuit this am also  Continue lifestyle intervention healthy eating and exercise . Ok to refill Flomax and metoprolol today  Counseled.  -Patient advised to return or notify health care team  if symptoms worsen or persist or new concerns arise.  Patient Instructions  Continue lifestyle intervention healthy eating and exercise . Will notify you  of labs when available. Bu can sign up for MY chart and veiw the results.   Wellness  In 6 months or as needed.    Neta Mends. Panosh M.D. Health Maintenance  Topic Date Due  . Tetanus/tdap  10/21/1958  . Zostavax  10/22/1999  . Pneumococcal Polysaccharide Vaccine Age 78 And Over  10/21/2004  . Influenza Vaccine  08/25/2013  . Colonoscopy  05/03/2015   Health Maintenance Review }  Pt says has had  Pneumovax  65 or  older   Check on price of zostavax  And rx when appropriate.

## 2013-07-30 LAB — BASIC METABOLIC PANEL
CO2: 28 mEq/L (ref 19–32)
Chloride: 108 mEq/L (ref 96–112)
Glucose, Bld: 97 mg/dL (ref 70–99)
Sodium: 140 mEq/L (ref 135–145)

## 2013-07-30 LAB — LIPID PANEL
HDL: 40.2 mg/dL (ref >39.00)
LDL Cholesterol: 105 mg/dL — ABNORMAL HIGH (ref 0–99)
Total CHOL/HDL Ratio: 4
Triglycerides: 101 mg/dL (ref 0.0–149.0)

## 2013-07-30 LAB — HEPATIC FUNCTION PANEL
Albumin: 4.2 g/dL (ref 3.5–5.2)
Total Bilirubin: 0.7 mg/dL (ref 0.3–1.2)

## 2013-08-05 ENCOUNTER — Other Ambulatory Visit: Payer: Self-pay | Admitting: Dermatology

## 2013-08-05 DIAGNOSIS — L259 Unspecified contact dermatitis, unspecified cause: Secondary | ICD-10-CM | POA: Diagnosis not present

## 2013-08-05 DIAGNOSIS — D485 Neoplasm of uncertain behavior of skin: Secondary | ICD-10-CM | POA: Diagnosis not present

## 2013-09-09 ENCOUNTER — Encounter: Payer: Self-pay | Admitting: Internal Medicine

## 2013-09-09 ENCOUNTER — Ambulatory Visit (INDEPENDENT_AMBULATORY_CARE_PROVIDER_SITE_OTHER): Payer: Medicare Other | Admitting: Internal Medicine

## 2013-09-09 VITALS — BP 106/66 | HR 78 | Temp 98.2°F | Wt 208.0 lb

## 2013-09-09 DIAGNOSIS — H019 Unspecified inflammation of eyelid: Secondary | ICD-10-CM | POA: Diagnosis not present

## 2013-09-09 MED ORDER — POLYMYXIN B-TRIMETHOPRIM 10000-0.1 UNIT/ML-% OP SOLN: 1.0000 [drp] | OPHTHALMIC | Status: DC

## 2013-09-09 NOTE — Progress Notes (Signed)
Chief Complaint  Patient presents with  . Conjunctivitis    Started yesterday with pain and weeping.    HPI: Patient comes in today for an acute visit new problem. Her with wife One day tender bump  Lower lid  And then eye turned red and runny  And hurts. No photophobia Vision change film .  Right eye . Only no exposures no trauma. No fever upper respiratory infection. ROS: See pertinent positives and negatives per HPI.  Past Medical History  Diagnosis Date  . 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 VITAMIN D DEFICIENCY 11/02/2009  . HYPERLIPIDEMIA 07/25/2007  . HYPERTENSION 07/23/2007  . CAD 01/22/2009  . Atrial fibrillation 07/06/2009  . 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  . RHINITIS, CHRONIC 05/11/2010  . ALLERGIC RHINITIS 07/23/2007  . GERD 07/25/2007  . HERNIA 07/06/2009  . 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 BENIGN PROSTATIC HYPERTROPHY 07/25/2007  . BACK PAIN, LUMBAR 10/25/2010  . LUMBAR RADICULOPATHY, RIGHT 10/18/2007  . BACK PAIN 11/02/2009  . MUSCLE PAIN 01/22/2009  . NEURITIS 10/25/2010  . DIZZINESS 01/22/2009  . Palpitations 05/09/2010  . DYSPNEA ON EXERTION 08/21/2008  . Cough 10/25/2010  . RUQ PAIN 01/22/2009  . HYPERGLYCEMIA, BORDERLINE 12/31/2007  . ECHOCARDIOGRAM, ABNORMAL 08/19/2007  . UNS ADVRS EFF OTH RX MEDICINAL&BIOLOGICAL SBSTNC 04/15/2008  . HX, PERSONAL, MALIGNANCY, SKIN NEC 10/18/2007  . COLONIC POLYPS, HX OF 10/18/2007  . Sinus polyp   . Voice strain   . Arthritis   . Cancer     skin  . Nausea   . Inguinal hernia   . Abdominal pain, right lateral 02/21/2012    progressive.      Family History  Problem Relation Age of Onset  . Lung cancer Mother   . Cancer Mother   . Heart attack Father   . Hypertension Father   . Alcohol abuse Father   . Heart disease Father   . Breast cancer Sister   . Cancer Sister     breast  . Alcohol abuse Brother   . Hypertension Brother   . Alcohol abuse Brother   . Hypertension Brother   . Alcohol abuse Brother   . Hypertension  Brother   . Kidney disease Sister     History   Social History  . Marital Status: Married    Spouse Name: N/A    Number of Children: N/A  . Years of Education: N/A   Social History Main Topics  . Smoking status: Former Smoker    Types: Cigarettes    Quit date: 04/02/1964  . Smokeless tobacco: Never Used     Comment: in early 20's he quit  . Alcohol Use: No     Comment: socially  . Drug Use: No  . Sexual Activity: None   Other Topics Concern  . None   Social History Narrative   Retired [employer]   Married   Non smoker some etoh   Travels a lot             Outpatient Encounter Prescriptions as of 09/09/2013  Medication Sig Dispense Refill  . aspirin 81 MG tablet Take 81 mg by mouth daily.        Marland Kitchen atorvastatin (LIPITOR) 40 MG tablet 1 every other day  45 tablet  6  . clobetasol (OLUX) 0.05 % topical foam Apply topically 2 (two) times daily. [facility]      . metoprolol (LOPRESSOR) 50 MG tablet Take 1/2 bid  90 tablet  3  . MULTIPLE  VITAMIN PO Take 1 tablet by mouth daily.       . nitroGLYCERIN (NITROSTAT) 0.4 MG SL tablet Place 1 tablet (0.4 mg total) under the tongue every 5 (five) minutes as needed for chest pain.  25 tablet  0  . NON FORMULARY Joint capsule 1 tab po qd      . Omega-3 Fatty Acids (OMEGA 3 PO) Take 1 capsule by mouth daily.      . RABEprazole (ACIPHEX) 20 MG tablet TAKE ONE TABLET ORALLY DAILY  90 tablet  0  . tamsulosin (FLOMAX) 0.4 MG CAPS capsule Take 1 capsule (0.4 mg total) by mouth daily.  90 capsule  3  . trimethoprim-polymyxin b (POLYTRIM) ophthalmic solution Place 1 drop into the right eye every 4 (four) hours. After warm compresses  10 mL  0  . [DISCONTINUED] co-enzyme Q-10 30 MG capsule Take 300 mg by mouth daily.        No facility-administered encounter medications on file as of 09/09/2013.    EXAM:  BP 106/66  Pulse 78  Temp(Src) 98.2 F (36.8 C) (Oral)  Wt 208 lb (94.348 kg)  BMI 30.7 kg/m2  SpO2 93%  Body mass index  is 30.7 kg/(m^2).  GENERAL: vitals reviewed and listed above, alert, oriented, appears well hydrated and in no acute distress with some obvious right eye redness.  HEENT: atraumatic, right lower eyelid slight swelling inversion red with pinpoint area also versus pustule no fb conjunctiva  reddened lower half, bulbar and lid,   weepy mucoid yellow discharge PERRLA no photophobia EOMs full no obvious abnormalities on inspection of external nose and ears OP : no lesion edema or exudate  NECK: no obvious masses on inspection palpation  MS: moves all extremities without noticeable focal  abnormality PSYCH: pleasant and cooperative, no obvious depression or anxiety ASSESSMENT AND PLAN:  Discussed the following assessment and plan:  Infection of eyelid Right lower lid  Compresses antibiotic topical    Expectant management. -Patient advised to return or notify health care team  if symptoms worsen or persist or new concerns arise.  Patient Instructions  Warm compresses  At least 4 x per day and they eye drops  Expect better in 3-5 days  If not better in a week or worse contact for further advise.  Neta Mends. Panosh M.D.

## 2013-09-09 NOTE — Patient Instructions (Addendum)
Warm compresses  At least 4 x per day and they eye drops  Expect better in 3-5 days  If not better in a week or worse contact for further advise.

## 2013-10-16 DIAGNOSIS — Z961 Presence of intraocular lens: Secondary | ICD-10-CM | POA: Diagnosis not present

## 2013-10-20 ENCOUNTER — Encounter: Payer: Self-pay | Admitting: Internal Medicine

## 2013-10-20 ENCOUNTER — Ambulatory Visit (INDEPENDENT_AMBULATORY_CARE_PROVIDER_SITE_OTHER): Payer: Medicare Other | Admitting: Internal Medicine

## 2013-10-20 VITALS — BP 148/86 | HR 79 | Temp 98.3°F | Wt 207.0 lb

## 2013-10-20 DIAGNOSIS — J209 Acute bronchitis, unspecified: Secondary | ICD-10-CM | POA: Diagnosis not present

## 2013-10-20 DIAGNOSIS — J988 Other specified respiratory disorders: Secondary | ICD-10-CM

## 2013-10-20 DIAGNOSIS — J22 Unspecified acute lower respiratory infection: Secondary | ICD-10-CM

## 2013-10-20 DIAGNOSIS — I700 Atherosclerosis of aorta: Secondary | ICD-10-CM

## 2013-10-20 MED ORDER — HYDROCODONE-HOMATROPINE 5-1.5 MG/5ML PO SYRP: 5.0000 mL | ORAL_SOLUTION | ORAL | Status: DC | PRN

## 2013-10-20 NOTE — Progress Notes (Signed)
Chief Complaint  Patient presents with  . Cough    Cough is productive of a green sputum.Mora Appl on Friday.  . Fatigue  . Sinus Pressure    HPI: Onset  3-4 days  Of med  Ur congestion  And not a lot of face pain . No cp sob  Wife had prolonged severe resp infection recently  No fever chills ROS: See pertinent positives and negatives per HPI. No cp sob   Past Medical History  Diagnosis Date  . 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 VITAMIN D DEFICIENCY 11/02/2009  . HYPERLIPIDEMIA 07/25/2007  . HYPERTENSION 07/23/2007  . CAD 01/22/2009  . Atrial fibrillation 07/06/2009  . 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  . RHINITIS, CHRONIC 05/11/2010  . ALLERGIC RHINITIS 07/23/2007  . GERD 07/25/2007  . HERNIA 07/06/2009  . 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 BENIGN PROSTATIC HYPERTROPHY 07/25/2007  . BACK PAIN, LUMBAR 10/25/2010  . LUMBAR RADICULOPATHY, RIGHT 10/18/2007  . BACK PAIN 11/02/2009  . MUSCLE PAIN 01/22/2009  . NEURITIS 10/25/2010  . DIZZINESS 01/22/2009  . Palpitations 05/09/2010  . DYSPNEA ON EXERTION 08/21/2008  . Cough 10/25/2010  . RUQ PAIN 01/22/2009  . HYPERGLYCEMIA, BORDERLINE 12/31/2007  . ECHOCARDIOGRAM, ABNORMAL 08/19/2007  . UNS ADVRS EFF OTH RX MEDICINAL&BIOLOGICAL SBSTNC 04/15/2008  . HX, PERSONAL, MALIGNANCY, SKIN NEC 10/18/2007  . COLONIC POLYPS, HX OF 10/18/2007  . Sinus polyp   . Voice strain   . Arthritis   . Cancer     skin  . Nausea   . Inguinal hernia   . Abdominal pain, right lateral 02/21/2012    progressive.      Family History  Problem Relation Age of Onset  . Lung cancer Mother   . Cancer Mother   . Heart attack Father   . Hypertension Father   . Alcohol abuse Father   . Heart disease Father   . Breast cancer Sister   . Cancer Sister     breast  . Alcohol abuse Brother   . Hypertension Brother   . Alcohol abuse Brother   . Hypertension Brother   . Alcohol abuse Brother   . Hypertension Brother   . Kidney disease Sister     History   Social  History  . Marital Status: Married    Spouse Name: N/A    Number of Children: N/A  . Years of Education: N/A   Social History Main Topics  . Smoking status: Former Smoker    Types: Cigarettes    Quit date: 04/02/1964  . Smokeless tobacco: Never Used     Comment: in early 20's he quit  . Alcohol Use: No     Comment: socially  . Drug Use: No  . Sexual Activity: None   Other Topics Concern  . None   Social History Narrative   Retired [employer]   Married   Non smoker some etoh   Travels a lot             Outpatient Encounter Prescriptions as of 10/20/2013  Medication Sig  . aspirin 81 MG tablet Take 81 mg by mouth daily.    Marland Kitchen atorvastatin (LIPITOR) 40 MG tablet 1 every other day  . metoprolol (LOPRESSOR) 50 MG tablet Take 1/2 bid  . MULTIPLE VITAMIN PO Take 1 tablet by mouth daily.   . nitroGLYCERIN (NITROSTAT) 0.4 MG SL tablet Place 1 tablet (0.4 mg total) under the tongue every 5 (five) minutes as needed for chest pain.  Marland Kitchen  NON FORMULARY Joint capsule 1 tab po qd  . Omega-3 Fatty Acids (OMEGA 3 PO) Take 1 capsule by mouth daily.  . RABEprazole (ACIPHEX) 20 MG tablet TAKE ONE TABLET ORALLY DAILY  . tamsulosin (FLOMAX) 0.4 MG CAPS capsule Take 1 capsule (0.4 mg total) by mouth daily.  Marland Kitchen trimethoprim-polymyxin b (POLYTRIM) ophthalmic solution Place 1 drop into the right eye every 4 (four) hours. After warm compresses  . HYDROcodone-homatropine (HYCODAN) 5-1.5 MG/5ML syrup Take 5 mLs by mouth every 4 (four) hours as needed for cough.  . [DISCONTINUED] clobetasol (OLUX) 0.05 % topical foam Apply topically 2 (two) times daily. [facility]    EXAM:  BP 148/86  Pulse 79  Temp(Src) 98.3 F (36.8 C) (Oral)  Wt 207 lb (93.895 kg)  BMI 30.55 kg/m2  SpO2 94%  Body mass index is 30.55 kg/(m^2). WDWN in NAD  quiet respirations; mildly congested  somewhat hoarse. Non toxic . HEENT: Normocephalic ;atraumatic , Eyes;  PERRL, EOMs  Full, lids and conjunctiva clear,,Ears: no  deformities, canals nl, TM landmarks normal, Nose: no deformity or discharge but congested;face minimally tender Mouth : OP clear without lesion or edema . Neck: Supple without adenopathy or masses or bruits Chest:  Clear to A&P without wheezes rales or rhonchi CV:  S1-S2 no gallops or murmurs peripheral perfusion is normal Skin :nl perfusion and no acute rashes   PSYCH: pleasant and cooperative, no obvious depression or anxiety  ASSESSMENT AND PLAN:  Discussed the following assessment and plan:  Acute bronchitis  Acute respiratory infection - at risk for bact sinusitis with hx of same  Atherosclerosis of aorta  -Patient advised to return or notify health care team  if symptoms worsen or persist or new concerns arise.  Patient Instructions  This is probably a viral resp infection  .  Antibiotic will not help at this time Rest fluids   Comfort care.   Contact us if not a lot better oin a week Cough could last 2-3 weeks but should feel better in shorter time .  Acute Bronchitis You have acute bronchitis. This means you have a chest cold. The airways in your lungs are red and sore (inflamed). Acute means it is sudden onset.  CAUSES Bronchitis is most often caused by the same virus that causes a cold. SYMPTOMS   Body aches.  Chest congestion.  Chills.  Cough.  Fever.  Shortness of breath.  Sore throat. TREATMENT  Acute bronchitis is usually treated with rest, fluids, and medicines for relief of fever or cough. Most symptoms should go away after a few days or a week. Increased fluids may help thin your secretions and will prevent dehydration. Your caregiver may give you an inhaler to improve your symptoms. The inhaler reduces shortness of breath and helps control cough. You can take over-the-counter pain relievers or cough medicine to decrease coughing, pain, or fever. A cool-air vaporizer may help thin bronchial secretions and make it easier to clear your  chest. Antibiotics are usually not needed but can be prescribed if you smoke, are seriously ill, have chronic lung problems, are elderly, or you are at higher risk for developing complications.Allergies and asthma can make bronchitis worse. Repeated episodes of bronchitis may cause longstanding lung problems. Avoid smoking and secondhand smoke.Exposure to cigarette smoke or irritating chemicals will make bronchitis worse. If you are a cigarette smoker, consider using nicotine gum or skin patches to help control withdrawal symptoms. Quitting smoking will help your lungs heal faster. Recovery from bronchitis  is often slow, but you should start feeling better after 2 to 3 days. Cough from bronchitis frequently lasts for 3 to 4 weeks. To prevent another bout of acute bronchitis:  Quit smoking.  Wash your hands frequently to get rid of viruses or use a hand sanitizer.  Avoid other people with cold or virus symptoms.  Try not to touch your hands to your mouth, nose, or eyes. SEEK IMMEDIATE MEDICAL CARE IF:  You develop increased fever, chills, or chest pain.  You have severe shortness of breath or bloody sputum.  You develop dehydration, fainting, repeated vomiting, or a severe headache.  You have no improvement after 1 week of treatment or you get worse. MAKE SURE YOU:   Understand these instructions.  Will watch your condition.  Will get help right away if you are not doing well or get worse. Document Released: 01/18/2005 Document Revised: 03/04/2012 Document Reviewed: 04/05/2011 [facility] Patient Information 2014 [place], [place].          Neta Mends. Clarie Camey M.D.

## 2013-10-20 NOTE — Patient Instructions (Addendum)
This is probably a viral resp infection  .  Antibiotic will not help at this time Rest fluids   Comfort care.   Contact us if not a lot better oin a week Cough could last 2-3 weeks but should feel better in shorter time .  Acute Bronchitis You have acute bronchitis. This means you have a chest cold. The airways in your lungs are red and sore (inflamed). Acute means it is sudden onset.  CAUSES Bronchitis is most often caused by the same virus that causes a cold. SYMPTOMS   Body aches.  Chest congestion.  Chills.  Cough.  Fever.  Shortness of breath.  Sore throat. TREATMENT  Acute bronchitis is usually treated with rest, fluids, and medicines for relief of fever or cough. Most symptoms should go away after a few days or a week. Increased fluids may help thin your secretions and will prevent dehydration. Your caregiver may give you an inhaler to improve your symptoms. The inhaler reduces shortness of breath and helps control cough. You can take over-the-counter pain relievers or cough medicine to decrease coughing, pain, or fever. A cool-air vaporizer may help thin bronchial secretions and make it easier to clear your chest. Antibiotics are usually not needed but can be prescribed if you smoke, are seriously ill, have chronic lung problems, are elderly, or you are at higher risk for developing complications.Allergies and asthma can make bronchitis worse. Repeated episodes of bronchitis may cause longstanding lung problems. Avoid smoking and secondhand smoke.Exposure to cigarette smoke or irritating chemicals will make bronchitis worse. If you are a cigarette smoker, consider using nicotine gum or skin patches to help control withdrawal symptoms. Quitting smoking will help your lungs heal faster. Recovery from bronchitis is often slow, but you should start feeling better after 2 to 3 days. Cough from bronchitis frequently lasts for 3 to 4 weeks. To prevent another bout of acute  bronchitis:  Quit smoking.  Wash your hands frequently to get rid of viruses or use a hand sanitizer.  Avoid other people with cold or virus symptoms.  Try not to touch your hands to your mouth, nose, or eyes. SEEK IMMEDIATE MEDICAL CARE IF:  You develop increased fever, chills, or chest pain.  You have severe shortness of breath or bloody sputum.  You develop dehydration, fainting, repeated vomiting, or a severe headache.  You have no improvement after 1 week of treatment or you get worse. MAKE SURE YOU:   Understand these instructions.  Will watch your condition.  Will get help right away if you are not doing well or get worse. Document Released: 01/18/2005 Document Revised: 03/04/2012 Document Reviewed: 04/05/2011 [facility] Patient Information 2014 [place], [place].

## 2013-10-25 DIAGNOSIS — J209 Acute bronchitis, unspecified: Secondary | ICD-10-CM | POA: Insufficient documentation

## 2013-10-25 DIAGNOSIS — I700 Atherosclerosis of aorta: Secondary | ICD-10-CM | POA: Insufficient documentation

## 2013-10-25 DIAGNOSIS — J22 Unspecified acute lower respiratory infection: Secondary | ICD-10-CM | POA: Insufficient documentation

## 2013-10-30 ENCOUNTER — Other Ambulatory Visit: Payer: Self-pay | Admitting: Internal Medicine

## 2013-10-30 ENCOUNTER — Other Ambulatory Visit: Payer: Self-pay

## 2013-11-03 ENCOUNTER — Ambulatory Visit (INDEPENDENT_AMBULATORY_CARE_PROVIDER_SITE_OTHER): Payer: Medicare Other | Admitting: Internal Medicine

## 2013-11-03 ENCOUNTER — Encounter: Payer: Self-pay | Admitting: Internal Medicine

## 2013-11-03 ENCOUNTER — Ambulatory Visit (INDEPENDENT_AMBULATORY_CARE_PROVIDER_SITE_OTHER)
Admission: RE | Admit: 2013-11-03 | Discharge: 2013-11-03 | Disposition: A | Discharge status: Home / Self Care | Payer: Medicare Other | Source: Ambulatory Visit | Attending: Internal Medicine | Admitting: Internal Medicine

## 2013-11-03 VITALS — BP 150/86 | HR 77 | Temp 98.4°F | Wt 209.0 lb

## 2013-11-03 DIAGNOSIS — J019 Acute sinusitis, unspecified: Secondary | ICD-10-CM

## 2013-11-03 DIAGNOSIS — R509 Fever, unspecified: Secondary | ICD-10-CM

## 2013-11-03 DIAGNOSIS — R05 Cough: Secondary | ICD-10-CM | POA: Diagnosis not present

## 2013-11-03 DIAGNOSIS — R059 Cough, unspecified: Secondary | ICD-10-CM | POA: Diagnosis not present

## 2013-11-03 MED ORDER — CEFTRIAXONE SODIUM 1 G IJ SOLR
1.0000 g | Freq: Once | INTRAMUSCULAR | Status: AC
  Administered 2013-11-03: 1 g via INTRAMUSCULAR

## 2013-11-03 MED ORDER — LEVOFLOXACIN 750 MG PO TABS: 750.0000 mg | ORAL_TABLET | Freq: Every day | ORAL | Status: DC

## 2013-11-03 NOTE — Patient Instructions (Signed)
Chest x ray to check for pneumonia and   Plan antibiotic regimen.  You should benefit from antibiotic for sinusitis .  Will contact you about results of x ray and plan . Either way sx should improve in the next 48- 72 hours  .

## 2013-11-03 NOTE — Progress Notes (Signed)
Chief Complaint  Patient presents with  . Cough    Cough has gotten worse.  Pressure in his head has gotten worse.  Has started coughing up green mucus again.    HPI:  Comes in today for a cute visit because still coughing and getting worse Here with wife ,  Shaking chills 2 night ago . Cough never went away and sinus problem in the meantime. Head pressure frontal    And now green mucous  . No sob vbut doesn't feel well no sob wheeze cough continues .  Traveling end of week.  No syncope is using cough meds.  ROS: See pertinent positives and negatives per HPI. No uti or gi new sx new rashes   Past Medical History  Diagnosis Date  . 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 VITAMIN D DEFICIENCY 11/02/2009  . HYPERLIPIDEMIA 07/25/2007  . HYPERTENSION 07/23/2007  . CAD 01/22/2009  . Atrial fibrillation 07/06/2009  . 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  . RHINITIS, CHRONIC 05/11/2010  . ALLERGIC RHINITIS 07/23/2007  . GERD 07/25/2007  . HERNIA 07/06/2009  . 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 BENIGN PROSTATIC HYPERTROPHY 07/25/2007  . BACK PAIN, LUMBAR 10/25/2010  . LUMBAR RADICULOPATHY, RIGHT 10/18/2007  . BACK PAIN 11/02/2009  . MUSCLE PAIN 01/22/2009  . NEURITIS 10/25/2010  . DIZZINESS 01/22/2009  . Palpitations 05/09/2010  . DYSPNEA ON EXERTION 08/21/2008  . Cough 10/25/2010  . RUQ PAIN 01/22/2009  . HYPERGLYCEMIA, BORDERLINE 12/31/2007  . ECHOCARDIOGRAM, ABNORMAL 08/19/2007  . UNS ADVRS EFF OTH RX MEDICINAL&BIOLOGICAL SBSTNC 04/15/2008  . HX, PERSONAL, MALIGNANCY, SKIN NEC 10/18/2007  . COLONIC POLYPS, HX OF 10/18/2007  . Sinus polyp   . Voice strain   . Arthritis   . Cancer     skin  . Nausea   . Inguinal hernia   . Abdominal pain, right lateral 02/21/2012    progressive.      Family History  Problem Relation Age of Onset  . Lung cancer Mother   . Cancer Mother   . Heart attack Father   . Hypertension Father   . Alcohol abuse Father   . Heart disease Father   . Breast cancer Sister   .  Cancer Sister     breast  . Alcohol abuse Brother   . Hypertension Brother   . Alcohol abuse Brother   . Hypertension Brother   . Alcohol abuse Brother   . Hypertension Brother   . Kidney disease Sister     History   Social History  . Marital Status: Married    Spouse Name: N/A    Number of Children: N/A  . Years of Education: N/A   Social History Main Topics  . Smoking status: Former Smoker    Types: Cigarettes    Quit date: 04/02/1964  . Smokeless tobacco: Never Used     Comment: in early 20's he quit  . Alcohol Use: No     Comment: socially  . Drug Use: No  . Sexual Activity: None   Other Topics Concern  . None   Social History Narrative   Retired [employer]   Married   Non smoker some etoh   Travels a lot             Outpatient Encounter Prescriptions as of 11/03/2013  Medication Sig  . aspirin 81 MG tablet Take 81 mg by mouth daily.    Marland Kitchen atorvastatin (LIPITOR) 40 MG tablet 1 every other day  . HYDROcodone-homatropine (HYCODAN) 5-1.5  MG/5ML syrup Take 5 mLs by mouth every 4 (four) hours as needed for cough.  . metoprolol (LOPRESSOR) 50 MG tablet Take 1/2 bid  . MULTIPLE VITAMIN PO Take 1 tablet by mouth daily.   . nitroGLYCERIN (NITROSTAT) 0.4 MG SL tablet Place 1 tablet (0.4 mg total) under the tongue every 5 (five) minutes as needed for chest pain.  . NON FORMULARY Joint capsule 1 tab po qd  . Omega-3 Fatty Acids (OMEGA 3 PO) Take 1 capsule by mouth daily.  . RABEprazole (ACIPHEX) 20 MG tablet TAKE ONE TABLET ORALLY DAILY  . tamsulosin (FLOMAX) 0.4 MG CAPS capsule Take 1 capsule (0.4 mg total) by mouth daily.  Marland Kitchen levofloxacin (LEVAQUIN) 750 MG tablet Take 1 tablet (750 mg total) by mouth daily.  Marland Kitchen trimethoprim-polymyxin b (POLYTRIM) ophthalmic solution Place 1 drop into the right eye every 4 (four) hours. After warm compresses  . [EXPIRED] cefTRIAXone (ROCEPHIN) injection 1 g     EXAM:  BP 150/86  Pulse 77  Temp(Src) 98.4 F (36.9 C) (Oral)  Wt  209 lb (94.802 kg)  SpO2 94%  Body mass index is 30.85 kg/(m^2).  WDWN in NAD  quiet respirations; mildly congested  somewhat hoarse. Non toxic .looks tired   HEENT: Normocephalic ;atraumatic , Eyes;  PERRL, EOMs  Full, lids and conjunctiva clear,,Ears: no deformities, canals nl, TM landmarks normal, Nose: no deformity yellow crusted dc discharge congested;face frontal not max tender Mouth : OP clear without lesion or edema . Neck: Supple without adenopathy or masses or bruits Chest:  ?Clear to A&P without wheezes rales or rhonchi CV:  S1-S2 no gallops or murmurs peripheral perfusion is normal Skin :nl perfusion and no acute rashes  MS: moves all extremities without noticeable focal  abnormality PSYCH: pleasant and cooperative, no obvious depression or anxiety  ASSESSMENT AND PLAN:  Discussed the following assessment and plan:  Fever chills - Plan: DG Chest 2 View  Acute sinusitis with symptoms greater than 10 days - Plan: DG Chest 2 View, cefTRIAXone (ROCEPHIN) injection 1 g Worsening sx  Suspect secondary infection  sinusitis likely  but get cxray to check for air space disease Rocephin given now  Plan oral antibiotic -Patient advised to return or notify health care team  if symptoms worsen or persist or new concerns arise.  Patient Instructions  Chest x ray to check for pneumonia and   Plan antibiotic regimen.  You should benefit from antibiotic for sinusitis .  Will contact you about results of x ray and plan . Either way sx should improve in the next 48- 72 hours  .    Neta Mends. Panosh M.D.

## 2013-12-29 DIAGNOSIS — Z23 Encounter for immunization: Secondary | ICD-10-CM | POA: Diagnosis not present

## 2014-01-05 ENCOUNTER — Other Ambulatory Visit: Payer: Self-pay | Admitting: Internal Medicine

## 2014-03-12 ENCOUNTER — Ambulatory Visit (INDEPENDENT_AMBULATORY_CARE_PROVIDER_SITE_OTHER): Payer: Medicare Other | Admitting: Family Medicine

## 2014-03-12 DIAGNOSIS — L259 Unspecified contact dermatitis, unspecified cause: Secondary | ICD-10-CM | POA: Diagnosis not present

## 2014-03-12 DIAGNOSIS — Z2911 Encounter for prophylactic immunotherapy for respiratory syncytial virus (RSV): Secondary | ICD-10-CM

## 2014-03-12 DIAGNOSIS — Z23 Encounter for immunization: Secondary | ICD-10-CM

## 2014-03-12 DIAGNOSIS — D235 Other benign neoplasm of skin of trunk: Secondary | ICD-10-CM | POA: Diagnosis not present

## 2014-03-18 DIAGNOSIS — S61209A Unspecified open wound of unspecified finger without damage to nail, initial encounter: Secondary | ICD-10-CM | POA: Diagnosis not present

## 2014-03-30 ENCOUNTER — Ambulatory Visit (INDEPENDENT_AMBULATORY_CARE_PROVIDER_SITE_OTHER): Payer: Medicare Other | Admitting: Internal Medicine

## 2014-03-30 ENCOUNTER — Encounter: Payer: Self-pay | Admitting: Internal Medicine

## 2014-03-30 VITALS — BP 150/88 | Temp 98.2°F | Ht 67.5 in | Wt 203.0 lb

## 2014-03-30 DIAGNOSIS — L723 Sebaceous cyst: Secondary | ICD-10-CM | POA: Diagnosis not present

## 2014-03-30 DIAGNOSIS — L089 Local infection of the skin and subcutaneous tissue, unspecified: Secondary | ICD-10-CM

## 2014-03-30 MED ORDER — DOXYCYCLINE HYCLATE 100 MG PO CAPS: 100.0000 mg | ORAL_CAPSULE | Freq: Two times a day (BID) | ORAL | Status: DC

## 2014-03-30 NOTE — Progress Notes (Signed)
Chief Complaint  Patient presents with  . Boil    Has been draining for 3 days.    HPI: Patient comes in today for SDA for  new problem evaluation. Has ? Cyst base of back and increased size redness   For 3 days or so  Draining some  Very tender.  No fever chills but doesnt feel that well . has hx of various skin cyst .   ROS: See pertinent positives and negatives per HPI.   Past Medical History  Diagnosis Date  . 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 VITAMIN D DEFICIENCY 11/02/2009  . HYPERLIPIDEMIA 07/25/2007  . HYPERTENSION 07/23/2007  . CAD 01/22/2009  . Atrial fibrillation 07/06/2009  . 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  . RHINITIS, CHRONIC 05/11/2010  . ALLERGIC RHINITIS 07/23/2007  . GERD 07/25/2007  . HERNIA 07/06/2009  . 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 BENIGN PROSTATIC HYPERTROPHY 07/25/2007  . BACK PAIN, LUMBAR 10/25/2010  . LUMBAR RADICULOPATHY, RIGHT 10/18/2007  . BACK PAIN 11/02/2009  . MUSCLE PAIN 01/22/2009  . NEURITIS 10/25/2010  . DIZZINESS 01/22/2009  . Palpitations 05/09/2010  . DYSPNEA ON EXERTION 08/21/2008  . Cough 10/25/2010  . RUQ PAIN 01/22/2009  . HYPERGLYCEMIA, BORDERLINE 12/31/2007  . ECHOCARDIOGRAM, ABNORMAL 08/19/2007  . UNS ADVRS EFF OTH RX MEDICINAL&BIOLOGICAL SBSTNC 04/15/2008  . HX, PERSONAL, MALIGNANCY, SKIN NEC 10/18/2007  . COLONIC POLYPS, HX OF 10/18/2007  . Sinus polyp   . Voice strain   . Arthritis   . Cancer     skin  . Nausea   . Inguinal hernia   . Abdominal pain, right lateral 02/21/2012    progressive.      Family History  Problem Relation Age of Onset  . Lung cancer Mother   . Cancer Mother   . Heart attack Father   . Hypertension Father   . Alcohol abuse Father   . Heart disease Father   . Breast cancer Sister   . Cancer Sister     breast  . Alcohol abuse Brother   . Hypertension Brother   . Alcohol abuse Brother   . Hypertension Brother   . Alcohol abuse Brother   . Hypertension Brother   . Kidney disease Sister      History   Social History  . Marital Status: Married    Spouse Name: N/A    Number of Children: N/A  . Years of Education: N/A   Social History Main Topics  . Smoking status: Former Smoker    Types: Cigarettes    Quit date: 04/02/1964  . Smokeless tobacco: Never Used     Comment: in early 20's he quit  . Alcohol Use: No     Comment: socially  . Drug Use: No  . Sexual Activity: None   Other Topics Concern  . None   Social History Narrative   Retired [employer]   Married   Non smoker some etoh   Travels a lot             Outpatient Encounter Prescriptions as of 03/30/2014  Medication Sig  . aspirin 81 MG tablet Take 81 mg by mouth daily.    Marland Kitchen atorvastatin (LIPITOR) 40 MG tablet 1 every other day  . metoprolol (LOPRESSOR) 50 MG tablet TAKE 1/2 TABLET TWICE A DAY  . MULTIPLE VITAMIN PO Take 1 tablet by mouth daily.   . nitroGLYCERIN (NITROSTAT) 0.4 MG SL tablet Place 1 tablet (0.4 mg total) under the tongue every 5 (five)  minutes as needed for chest pain.  . NON FORMULARY Joint capsule 1 tab po qd  . Omega-3 Fatty Acids (OMEGA 3 PO) Take 1 capsule by mouth daily.  . RABEprazole (ACIPHEX) 20 MG tablet TAKE ONE TABLET ORALLY DAILY  . tamsulosin (FLOMAX) 0.4 MG CAPS capsule Take 1 capsule (0.4 mg total) by mouth daily.  Marland Kitchen trimethoprim-polymyxin b (POLYTRIM) ophthalmic solution Place 1 drop into the right eye every 4 (four) hours. After warm compresses  . doxycycline (VIBRAMYCIN) 100 MG capsule Take 1 capsule (100 mg total) by mouth 2 (two) times daily.  . [DISCONTINUED] HYDROcodone-homatropine (HYCODAN) 5-1.5 MG/5ML syrup Take 5 mLs by mouth every 4 (four) hours as needed for cough.  . [DISCONTINUED] levofloxacin (LEVAQUIN) 750 MG tablet Take 1 tablet (750 mg total) by mouth daily.    EXAM:  BP 150/88  Temp(Src) 98.2 F (36.8 C) (Oral)  Ht 5' 7.5" (1.715 m)  Wt 203 lb (92.08 kg)  BMI 31.31 kg/m2  Body mass index is 31.31 kg/(m^2).  GENERAL: vitals reviewed  and listed above, alert, oriented, appears well hydrated and in no acute distress HEENT: atraumatic, conjunctiva  clear, no obvious abnormalities on inspection of external nose and ears MS: moves all extremities without noticeable focal  abnormality PSYCH: pleasant and cooperative, no obvious depression or anxiety Skin lower back para spinal just to right  There is a 1.5-2 cm lesion with 3 small openings draining   Cystic like  large amount of pus expressed and send for culture   Area looks hemorraghic ( prob from manipulation)   No  Blood expressed .  Some relief with procedure . ASSESSMENT AND PLAN:  Discussed the following assessment and plan:  Infected sebaceous cyst of skin - Plan: Wound culture Local care  hot compresses  At least bid  Antibiotic fu if  persistent or progressive  -  Patient Instructions  Continue hot compresses  To cyst . Area  Can drain more  Add antibiotic  Will contact about culture results when available.  If recurrent  problemsconsider getting surgery to remove cyst in this area.      Standley Brooking. Nikeria Kalman M.D.  Pre visit review using our clinic review tool, if applicable. No additional management support is needed unless otherwise documented below in the visit note. Total visit 52mins > 50% spent counseling and coordinating care time.

## 2014-03-30 NOTE — Patient Instructions (Signed)
Continue hot compresses  To cyst . Area  Can drain more  Add antibiotic  Will contact about culture results when available.  If recurrent  problemsconsider getting surgery to remove cyst in this area.

## 2014-04-02 LAB — WOUND CULTURE
GRAM STAIN: NONE SEEN
Gram Stain: NONE SEEN
Organism ID, Bacteria: NO GROWTH

## 2014-04-03 ENCOUNTER — Telehealth: Payer: Self-pay | Admitting: Internal Medicine

## 2014-04-03 MED ORDER — ATORVASTATIN CALCIUM 40 MG PO TABS: ORAL_TABLET | ORAL | Status: DC

## 2014-04-03 MED ORDER — TAMSULOSIN HCL 0.4 MG PO CAPS: 0.4000 mg | ORAL_CAPSULE | Freq: Every day | ORAL | Status: DC

## 2014-04-03 MED ORDER — METOPROLOL TARTRATE 50 MG PO TABS: ORAL_TABLET | ORAL | Status: DC

## 2014-04-03 NOTE — Telephone Encounter (Signed)
Patient came to the office today to pick up rx.

## 2014-04-03 NOTE — Telephone Encounter (Signed)
Pt's wife is calling, pt is needing new rx for tamsulosin (FLOMAX) 0.4 MG CAPS capsule, metroprolol (lopressor) 50 mg and atrorvastatin (lipotor) 40 mg, send to [facility].

## 2014-06-02 ENCOUNTER — Other Ambulatory Visit: Payer: Self-pay | Admitting: Internal Medicine

## 2014-06-04 NOTE — Telephone Encounter (Signed)
Ok x 6 months 

## 2014-06-05 ENCOUNTER — Telehealth: Payer: Self-pay | Admitting: Internal Medicine

## 2014-06-05 NOTE — Telephone Encounter (Signed)
Pt is out of aciphex. Please call [facility] in [place]

## 2014-06-05 NOTE — Telephone Encounter (Signed)
Sent to the pharmacy by e-scribe. 

## 2014-06-05 NOTE — Telephone Encounter (Signed)
Sent to the pharmacy by e-scribe for 6 months. 

## 2014-07-15 ENCOUNTER — Telehealth: Payer: Self-pay | Admitting: Internal Medicine

## 2014-07-15 ENCOUNTER — Other Ambulatory Visit: Payer: Self-pay | Admitting: Internal Medicine

## 2014-07-15 NOTE — Telephone Encounter (Signed)
Pt needs refill on tamsulosin 0.4 mg #90 w/refill call into [facility] in [place]. Pt has appt on 07-27-14

## 2014-07-16 MED ORDER — TAMSULOSIN HCL 0.4 MG PO CAPS: 0.4000 mg | ORAL_CAPSULE | Freq: Every day | ORAL | Status: DC

## 2014-07-16 NOTE — Telephone Encounter (Signed)
Sent to the pharmacy by e-scribe. 

## 2014-07-27 ENCOUNTER — Encounter: Payer: Self-pay | Admitting: Internal Medicine

## 2014-07-27 ENCOUNTER — Ambulatory Visit (INDEPENDENT_AMBULATORY_CARE_PROVIDER_SITE_OTHER): Payer: Medicare Other | Admitting: Internal Medicine

## 2014-07-27 VITALS — BP 140/70 | Temp 98.6°F | Ht 67.0 in | Wt 204.0 lb

## 2014-07-27 DIAGNOSIS — Z0000 Encounter for general adult medical examination without abnormal findings: Secondary | ICD-10-CM

## 2014-07-27 DIAGNOSIS — I700 Atherosclerosis of aorta: Secondary | ICD-10-CM

## 2014-07-27 DIAGNOSIS — E785 Hyperlipidemia, unspecified: Secondary | ICD-10-CM

## 2014-07-27 DIAGNOSIS — Z951 Presence of aortocoronary bypass graft: Secondary | ICD-10-CM

## 2014-07-27 DIAGNOSIS — I10 Essential (primary) hypertension: Secondary | ICD-10-CM | POA: Diagnosis not present

## 2014-07-27 DIAGNOSIS — R7309 Other abnormal glucose: Secondary | ICD-10-CM

## 2014-07-27 DIAGNOSIS — Z23 Encounter for immunization: Secondary | ICD-10-CM | POA: Diagnosis not present

## 2014-07-27 DIAGNOSIS — R229 Localized swelling, mass and lump, unspecified: Secondary | ICD-10-CM

## 2014-07-27 DIAGNOSIS — R739 Hyperglycemia, unspecified: Secondary | ICD-10-CM

## 2014-07-27 DIAGNOSIS — R350 Frequency of micturition: Secondary | ICD-10-CM

## 2014-07-27 DIAGNOSIS — R21 Rash and other nonspecific skin eruption: Secondary | ICD-10-CM

## 2014-07-27 LAB — CBC WITH DIFFERENTIAL/PLATELET
BASOS ABS: 0 10*3/uL (ref 0.0–0.1)
Basophils Relative: 0.3 % (ref 0.0–3.0)
Eosinophils Absolute: 0.3 10*3/uL (ref 0.0–0.7)
Eosinophils Relative: 3.8 % (ref 0.0–5.0)
HCT: 47.8 % (ref 39.0–52.0)
Hemoglobin: 16.3 g/dL (ref 13.0–17.0)
LYMPHS PCT: 24.1 % (ref 12.0–46.0)
Lymphs Abs: 1.8 10*3/uL (ref 0.7–4.0)
MCHC: 34.1 g/dL (ref 30.0–36.0)
MCV: 95.3 fl (ref 78.0–100.0)
Monocytes Absolute: 0.8 10*3/uL (ref 0.1–1.0)
Monocytes Relative: 10.9 % (ref 3.0–12.0)
NEUTROS ABS: 4.5 10*3/uL (ref 1.4–7.7)
Neutrophils Relative %: 60.9 % (ref 43.0–77.0)
PLATELETS: 221 10*3/uL (ref 150.0–400.0)
RBC: 5.02 Mil/uL (ref 4.22–5.81)
RDW: 13.6 % (ref 11.5–15.5)
WBC: 7.5 10*3/uL (ref 4.0–10.5)

## 2014-07-27 LAB — POCT URINALYSIS DIPSTICK
BILIRUBIN UA: NEGATIVE
Blood, UA: NEGATIVE
GLUCOSE UA: NEGATIVE
Leukocytes, UA: NEGATIVE
NITRITE UA: NEGATIVE
PH UA: 5
SPEC GRAV UA: 1.02
Urobilinogen, UA: 1

## 2014-07-27 LAB — HEPATIC FUNCTION PANEL
ALBUMIN: 4.2 g/dL (ref 3.5–5.2)
ALT: 37 U/L (ref 0–53)
AST: 26 U/L (ref 0–37)
Alkaline Phosphatase: 53 U/L (ref 39–117)
BILIRUBIN DIRECT: 0.2 mg/dL (ref 0.0–0.3)
Total Bilirubin: 0.9 mg/dL (ref 0.2–1.2)
Total Protein: 7 g/dL (ref 6.0–8.3)

## 2014-07-27 LAB — BASIC METABOLIC PANEL
BUN: 14 mg/dL (ref 6–23)
CALCIUM: 9.1 mg/dL (ref 8.4–10.5)
CO2: 27 meq/L (ref 19–32)
Chloride: 105 mEq/L (ref 96–112)
Creatinine, Ser: 0.8 mg/dL (ref 0.4–1.5)
GFR: 103.2 mL/min (ref >60.00)
GLUCOSE: 87 mg/dL (ref 70–99)
Potassium: 3.7 mEq/L (ref 3.5–5.1)
Sodium: 137 mEq/L (ref 135–145)

## 2014-07-27 LAB — LIPID PANEL
CHOLESTEROL: 153 mg/dL (ref 0–200)
HDL: 43.1 mg/dL (ref >39.00)
LDL CALC: 99 mg/dL (ref 0–99)
NonHDL: 109.9
Total CHOL/HDL Ratio: 4
Triglycerides: 55 mg/dL (ref 0.0–149.0)
VLDL: 11 mg/dL (ref 0.0–40.0)

## 2014-07-27 LAB — HEMOGLOBIN A1C: Hgb A1c MFr Bld: 5.9 % (ref 4.6–6.5)

## 2014-07-27 LAB — TSH: TSH: 1.18 u[IU]/mL (ref 0.35–4.50)

## 2014-07-27 NOTE — Patient Instructions (Signed)
Will notify you  of labs when available.   blood pressure is elevated today  And you may benefit from adding medication to the lopressor . If up all the time   Should get  colonoscopy or other colons cancer screening .  Will send a flag to dr Ardis Hughs or gessner about this. YOU MAY BE DUE 5 16     Healthy lifestyle includes : At least 150 minutes of exercise weeks  , weight at healthy levels, which is usually   BMI 19-25. Avoid trans fats and processed foods;  Increase fresh fruits and veges to 5 servings per day. And avoid sweet beverages including tea and juice. Mediterranean diet with olive oil and nuts have been noted to be heart and brain healthy . Avoid tobacco products . Limit  alcohol to  7 per week for women and 14 servings for men.  Get adequate sleep .   See dermatology about the rash on side of leg since there for months and not better witht the cortisone treatment.  If trigger fingers get worse  Hand ortho specialist consider tendon injection but ok to follow at this time .  The nodule on the toe may be arthritis related   Consider getting x ray if  persistent or progressive  Pain or problem with this.   Advance Directive Advance directives are the legal documents that allow you to make choices about your health care and medical treatment if you cannot speak for yourself. Advance directives are a way for you to communicate your wishes to family, friends, and health care providers. The specified people can then convey your decisions about end-of-life care to avoid confusion if you should become unable to communicate. Ideally, the process of discussing and writing advance directives should happen over time rather than making decisions all at once. Advance directives can be modified as your situation changes, and you can change your mind at any time, even after you have signed the advance directives. Each state has its own laws regarding advance directives. You may want to check  with your health care provider, attorney, or state representative about the law in your state. Below are some examples of advance directives. LIVING WILL A living will is a set of instructions documenting your wishes about medical care when you cannot care for yourself. It is used if you become: Terminally ill. Incapacitated. Unable to communicate. Unable to make decisions. Items to consider in your living will include: The use or non-use of life-sustaining equipment, such as dialysis machines and breathing machines (ventilators). A do not resuscitate (DNR) order, which is the instruction not to use cardiopulmonary resuscitation (CPR) if breathing or heartbeat stops. Tube feeding. Withholding of food and fluids. Comfort (palliative) care when the goal becomes comfort rather than a cure. Organ and tissue donation. A living will does not give instructions about distribution of your money and property if you should pass away. It is advisable to seek the expert advice of a lawyer in drawing up a will regarding your possessions. Decisions about taxes, beneficiaries, and asset distribution will be legally binding. This process can relieve your family and friends of any burdens surrounding disputes or questions that may come up about the allocation of your assets. DO NOT RESUSCITATE (DNR) A do not resuscitate (DNR) order is a request to not have CPR in the event that your heart stops beating or you stop breathing. Unless given other instructions, a health care provider will try to help any patient whose  heart has stopped or who has stopped breathing.  HEALTH CARE PROXY AND DURABLE POWER OF ATTORNEY FOR HEALTH CARE A health care proxy is a person (agent) appointed to make medical decisions for you if you cannot. Generally, people choose someone they know well and trust to represent their preferences when they can no longer do so. You should be sure to ask this person for agreement to act as your agent. An  agent may have to exercise judgment in the event of a medical decision for which your wishes are not known. The durable power of attorney for health care is the legal document that names your health care proxy. Once written, it should be: Signed. Notarized. Dated. Copied. Witnessed. Incorporated into your medical record. You may also want to appoint someone to manage your financial affairs if you cannot. This is called a durable power of attorney for finances. It is a separate legal document from the durable power of attorney for health care. You may choose the same person or someone different from your health care proxy to act as your agent in financial matters. Document Released: 03/19/2008 Document Revised: 12/16/2013 Document Reviewed: 04/30/2013 [facility] Patient Information 2015 [employer] [place], [place]. This information is not intended to replace advice given to you by your health care provider. Make sure you discuss any questions you have with your health care provider.

## 2014-07-27 NOTE — Progress Notes (Signed)
Pre visit review using our clinic review tool, if applicable. No additional management support is needed unless otherwise documented below in the visit note.  Chief Complaint  Patient presents with  . Medicare Wellness    toe nodule, rash ,other    HPI: Patient comes in today for Preventive Medicare wellness visit . No major injuries, ed visits ,hospitalizations , new medications since last visit.   Rash left leg  Ongoing since then march    Not better with  clobetesol .    Left toe second  Nodule resmot hx of injury  Nodule recent and tender no redness other trauma  BP:   No checking at home  Cards  No cp sob to have check next week, ocass palpitations Health Maintenance  Topic Date Due  . Tetanus/tdap  10/21/1958  . Pneumococcal Polysaccharide Vaccine Age 75 And Over  10/21/2004  . Influenza Vaccine  07/25/2014  . Colonoscopy  05/03/2015  . Zostavax  Completed   Health Maintenance Review LIFESTYLE:  Exercise:   recumbent bike 50 per day  Tobacco/ETS: no  Alcohol:   Very minor  Sugar beverages: diet coke  Sleep: 5-6 hoiurs broke up .  Drug use: no Colonoscopy:  2006  ?  ? Virtual CT when due    Hearing:  Ok   Vision:  No limitations at present . Last eye check UTD ok cataracts   Safety:  Has smoke detector and wears seat belts.  No firearms. No excess sun exposure. Sees dentist regularly.  Falls: no  Advance directive :  Reviewed  Doesn't have one.  Memory: Felt to be good  , no concern from her or her family.  Depression: No anhedonia unusual crying or depressive symptoms  Nutrition: Eats well balanced diet; adequate calcium and vitamin D. No swallowing chewing problems.  Injury: no major injuries in the last six months.  Other healthcare providers:  Reviewed today .  Social:  Lives with spouse married.    Preventive parameters: up-to-date  Reviewed   ADLS:   There are no problems or need for assistance  driving, feeding, obtaining food, dressing,  toileting and bathing, managing money using phone. is independent. spouse does finances.    ROS:  As above  GEN/ HEENT: No fever, significant weight changes sweats headaches vision problems hearing changes, CV/ PULM; No chest pain shortness of breath cough, syncope,edema  change in exercise tolerance. GI /GU: No adominal pain, vomiting, change in bowel habits. No blood in the stool. No significant GU symptoms. SKIN/HEME: ,no acute skin rashes suspicious lesions or bleeding. No lymphadenopathy, nodules, masses.  NEURO/ PSYCH:  No neurologic signs such as weakness numbness. No depression anxiety. IMM/ Allergy: No unusual infections.  Allergy .   REST of 12 system review negative except as per HPI   Past Medical History  Diagnosis Date  . 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 VITAMIN D DEFICIENCY 11/02/2009  . HYPERLIPIDEMIA 07/25/2007  . HYPERTENSION 07/23/2007  . CAD 01/22/2009  . Atrial fibrillation 07/06/2009  . 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  . RHINITIS, CHRONIC 05/11/2010  . ALLERGIC RHINITIS 07/23/2007  . GERD 07/25/2007  . HERNIA 07/06/2009  . 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 BENIGN PROSTATIC HYPERTROPHY 07/25/2007  . BACK PAIN, LUMBAR 10/25/2010  . LUMBAR RADICULOPATHY, RIGHT 10/18/2007  . BACK PAIN 11/02/2009  . MUSCLE PAIN 01/22/2009  . NEURITIS 10/25/2010  . DIZZINESS 01/22/2009  . Palpitations 05/09/2010  . DYSPNEA ON EXERTION 08/21/2008  . Cough 10/25/2010  . RUQ PAIN 01/22/2009  .  HYPERGLYCEMIA, BORDERLINE 12/31/2007  . ECHOCARDIOGRAM, ABNORMAL 08/19/2007  . UNS ADVRS EFF OTH RX MEDICINAL&BIOLOGICAL SBSTNC 04/15/2008  . HX, PERSONAL, MALIGNANCY, SKIN NEC 10/18/2007  . COLONIC POLYPS, HX OF 10/18/2007  . Sinus polyp   . Voice strain   . Arthritis   . Cancer     skin  . Nausea   . Inguinal hernia   . Abdominal pain, right lateral 02/21/2012    progressive.      Family History  Problem Relation Age of Onset  . Lung cancer Mother   . Cancer Mother   . Heart attack Father   .  Hypertension Father   . Alcohol abuse Father   . Heart disease Father   . Breast cancer Sister   . Cancer Sister     breast  . Alcohol abuse Brother   . Hypertension Brother   . Alcohol abuse Brother   . Hypertension Brother   . Alcohol abuse Brother   . Hypertension Brother   . Kidney disease Sister     History   Social History  . Marital Status: Married    Spouse Name: N/A    Number of Children: N/A  . Years of Education: N/A   Social History Main Topics  . Smoking status: Former Smoker    Types: Cigarettes    Quit date: 04/02/1964  . Smokeless tobacco: Never Used     Comment: in early 20's he quit  . Alcohol Use: No     Comment: socially  . Drug Use: No  . Sexual Activity: Not on file   Other Topics Concern  . Not on file   Social History Narrative   Retired [employer]   Married   Non smoker some etoh   Travels a lot             Outpatient Encounter Prescriptions as of 07/27/2014  Medication Sig  . aspirin 81 MG tablet Take 81 mg by mouth daily.    Marland Kitchen atorvastatin (LIPITOR) 40 MG tablet 1/2 every other day  . clobetasol cream (TEMOVATE) 6.29 % Apply 1 application topically 2 (two) times daily.  Marland Kitchen doxycycline (VIBRAMYCIN) 100 MG capsule Take 1 capsule (100 mg total) by mouth 2 (two) times daily.  . metoprolol (LOPRESSOR) 50 MG tablet TAKE 1/2 TABLET TWICE A DAY  . MULTIPLE VITAMIN PO Take 1 tablet by mouth daily.   . nitroGLYCERIN (NITROSTAT) 0.4 MG SL tablet Place 1 tablet (0.4 mg total) under the tongue every 5 (five) minutes as needed for chest pain.  . NON FORMULARY Joint capsule 1 tab po qd  . Omega-3 Fatty Acids (OMEGA 3 PO) Take 1 capsule by mouth daily.  . RABEprazole (ACIPHEX) 20 MG tablet TAKE 1 TABLET DAILY  . tamsulosin (FLOMAX) 0.4 MG CAPS capsule Take 1 capsule (0.4 mg total) by mouth daily.  Marland Kitchen trimethoprim-polymyxin b (POLYTRIM) ophthalmic solution Place 1 drop into the right eye every 4 (four) hours. After warm compresses  . [DISCONTINUED]  atorvastatin (LIPITOR) 40 MG tablet 1 every other day    EXAM:  BP 140/70  Temp(Src) 98.6 F (37 C) (Oral)  Ht 5\' 7"  (1.702 m)  Wt 204 lb (92.534 kg)  BMI 31.94 kg/m2  Body mass index is 31.94 kg/(m^2).  Physical Exam: Vital signs reviewed UTM:LYYT is a well-developed well-nourished alert cooperative   who appears stated age in no acute distress.  HEENT: normocephalic atraumatic , Eyes: PERRL EOM's full, conjunctiva clear, Nares: paten,t no deformity discharge or  tenderness., Ears: no deformity EAC's clear TMs with normal landmarks. Mouth: clear OP, no lesions, edema.  Moist mucous membranes. NECK: supple without masses, thyromegaly  CHEST/PULM:  Clear to auscultation and percussion breath sounds equal no wheeze , rales or rhonchi. No chest wall deformities or tenderness. CV: PMI is nondisplaced, S1 S2 no gallops, murmurs, rubs. Peripheral pulses are full without delay.No JVD . Well healed scar  ABDOMEN: Bowel sounds normal nontender  No guard or rebound, no hepato splenomegal no CVA tenderness.  No hernia. Extremtities:  No clubbing cyanosis or edema, no acute joint swelling or redness no focal atrophy left second toe with nodule dip no other deformity . NEURO:  Oriented x3, cranial nerves 3-12 appear to be intact, no obvious focal weakness,gait within normal limits no abnormal reflexes or asymmetrical SKIN: left lateral leg with large patch pink redness about 8 cm area somewhat raised and plaquelike  normal turgor, color, no bruising or petechiae. PSYCH: Oriented, good eye contact, no obvious depression anxiety, cognition and judgment appear normal. LN: no cervical axillary inguinal adenopathy No noted deficits in memory, attention, and speech. Rectal prostate no nodules heme neg 2+   ASSESSMENT AND PLAN:  Discussed the following assessment and plan:  Medicare annual wellness visit, initial  [facility]: Basic metabolic panel, CBC with Differential, Hemoglobin A1c,  Hepatic function panel, Lipid panel, TSH, POCT urinalysis dipstick  HYPERTENSION - readings up today 140 on repeat get monitor and bring to a visit . may need intervention - Plan: Basic metabolic panel, CBC with Differential, Hemoglobin A1c, Hepatic function panel, Lipid panel, TSH, POCT urinalysis dipstick  S/P CABG (coronary artery bypass graft) - Plan: Basic metabolic panel, CBC with Differential, Hemoglobin A1c, Hepatic function panel, Lipid panel, TSH, POCT urinalysis dipstick  Hyperglycemia - Plan: Basic metabolic panel, CBC with Differential, Hemoglobin A1c, Hepatic function panel, Lipid panel, TSH, POCT urinalysis dipstick  Atherosclerosis of aorta - Plan: Basic metabolic panel, CBC with Differential, Hemoglobin A1c, Hepatic function panel, Lipid panel, TSH, POCT urinalysis dipstick  Urinary frequency - Plan: Basic metabolic panel, CBC with Differential, Hemoglobin A1c, Hepatic function panel, Lipid panel, TSH, POCT urinalysis dipstick  Need for vaccination with 13-polyvalent pneumococcal conjugate vaccine - Plan: Pneumococcal conjugate vaccine 13-valent  Rash and nonspecific skin eruption  Nodule, subcutaneous - toe re;lated  Patient Care Team: [name], MD as PCP - General Josue Hector, MD (Cardiology) Rexene Alberts, MD (Thoracic Diseases) Milus Banister, MD as Attending Physician (Gastroenterology)  Patient Instructions  Will notify you  of labs when available.   blood pressure is elevated today  And you may benefit from adding medication to the lopressor . If up all the time   Should get  colonoscopy or other colons cancer screening .  Will send a flag to dr Ardis Hughs or gessner about this. YOU MAY BE DUE 5 16     Healthy lifestyle includes : At least 150 minutes of exercise weeks  , weight at healthy levels, which is usually   BMI 19-25. Avoid trans fats and processed foods;  Increase fresh fruits and veges to 5 servings per day. And avoid sweet beverages  including tea and juice. Mediterranean diet with olive oil and nuts have been noted to be heart and brain healthy . Avoid tobacco products . Limit  alcohol to  7 per week for women and 14 servings for men.  Get adequate sleep .   See dermatology about the rash on side of leg since  there for months and not better witht the cortisone treatment.  If trigger fingers get worse  Hand ortho specialist consider tendon injection but ok to follow at this time .  The nodule on the toe may be arthritis related   Consider getting x ray if  persistent or progressive  Pain or problem with this.   Advance Directive Advance directives are the legal documents that allow you to make choices about your health care and medical treatment if you cannot speak for yourself. Advance directives are a way for you to communicate your wishes to family, friends, and health care providers. The specified people can then convey your decisions about end-of-life care to avoid confusion if you should become unable to communicate. Ideally, the process of discussing and writing advance directives should happen over time rather than making decisions all at once. Advance directives can be modified as your situation changes, and you can change your mind at any time, even after you have signed the advance directives. Each state has its own laws regarding advance directives. You may want to check with your health care provider, attorney, or state representative about the law in your state. Below are some examples of advance directives. LIVING WILL A living will is a set of instructions documenting your wishes about medical care when you cannot care for yourself. It is used if you become: Terminally ill. Incapacitated. Unable to communicate. Unable to make decisions. Items to consider in your living will include: The use or non-use of life-sustaining equipment, such as dialysis machines and breathing machines (ventilators). A do not  resuscitate (DNR) order, which is the instruction not to use cardiopulmonary resuscitation (CPR) if breathing or heartbeat stops. Tube feeding. Withholding of food and fluids. Comfort (palliative) care when the goal becomes comfort rather than a cure. Organ and tissue donation. A living will does not give instructions about distribution of your money and property if you should pass away. It is advisable to seek the expert advice of a lawyer in drawing up a will regarding your possessions. Decisions about taxes, beneficiaries, and asset distribution will be legally binding. This process can relieve your family and friends of any burdens surrounding disputes or questions that may come up about the allocation of your assets. DO NOT RESUSCITATE (DNR) A do not resuscitate (DNR) order is a request to not have CPR in the event that your heart stops beating or you stop breathing. Unless given other instructions, a health care provider will try to help any patient whose heart has stopped or who has stopped breathing.  HEALTH CARE PROXY AND DURABLE POWER OF ATTORNEY FOR HEALTH CARE A health care proxy is a person (agent) appointed to make medical decisions for you if you cannot. Generally, people choose someone they know well and trust to represent their preferences when they can no longer do so. You should be sure to ask this person for agreement to act as your agent. An agent may have to exercise judgment in the event of a medical decision for which your wishes are not known. The durable power of attorney for health care is the legal document that names your health care proxy. Once written, it should be: Signed. Notarized. Dated. Copied. Witnessed. Incorporated into your medical record. You may also want to appoint someone to manage your financial affairs if you cannot. This is called a durable power of attorney for finances. It is a separate legal document from the durable power of attorney for health  care.  You may choose the same person or someone different from your health care proxy to act as your agent in financial matters. Document Released: 03/19/2008 Document Revised: 12/16/2013 Document Reviewed: 04/30/2013 [facility] Patient Information 2015 [place], [place]. This information is not intended to replace advice given to you by your health care provider. Make sure you discuss any questions you have with your health care provider.        Standley Brooking. Panosh M.D.

## 2014-07-28 ENCOUNTER — Encounter: Payer: Self-pay | Admitting: Family Medicine

## 2014-07-28 ENCOUNTER — Telehealth: Payer: Self-pay | Admitting: Internal Medicine

## 2014-07-28 NOTE — Telephone Encounter (Signed)
Relevant patient education assigned to patient using Emmi. ° °

## 2014-07-29 DIAGNOSIS — L259 Unspecified contact dermatitis, unspecified cause: Secondary | ICD-10-CM | POA: Diagnosis not present

## 2014-08-04 ENCOUNTER — Telehealth: Payer: Self-pay | Admitting: Cardiovascular Disease

## 2014-08-04 ENCOUNTER — Ambulatory Visit (INDEPENDENT_AMBULATORY_CARE_PROVIDER_SITE_OTHER): Payer: Medicare Other | Admitting: Cardiovascular Disease

## 2014-08-04 ENCOUNTER — Encounter: Payer: Self-pay | Admitting: Cardiovascular Disease

## 2014-08-04 VITALS — BP 166/97 | HR 77 | Ht 67.0 in | Wt 203.8 lb

## 2014-08-04 DIAGNOSIS — I4891 Unspecified atrial fibrillation: Secondary | ICD-10-CM | POA: Diagnosis not present

## 2014-08-04 DIAGNOSIS — Z79899 Other long term (current) drug therapy: Secondary | ICD-10-CM

## 2014-08-04 DIAGNOSIS — IMO0002 Reserved for concepts with insufficient information to code with codable children: Secondary | ICD-10-CM

## 2014-08-04 DIAGNOSIS — M79609 Pain in unspecified limb: Secondary | ICD-10-CM | POA: Diagnosis not present

## 2014-08-04 DIAGNOSIS — E785 Hyperlipidemia, unspecified: Secondary | ICD-10-CM | POA: Diagnosis not present

## 2014-08-04 DIAGNOSIS — I482 Chronic atrial fibrillation, unspecified: Secondary | ICD-10-CM

## 2014-08-04 DIAGNOSIS — M79606 Pain in leg, unspecified: Secondary | ICD-10-CM

## 2014-08-04 MED ORDER — LOSARTAN POTASSIUM 50 MG PO TABS
50.0000 mg | ORAL_TABLET | Freq: Every day | ORAL | Status: DC
Start: 2014-08-04 — End: 2015-08-26

## 2014-08-04 NOTE — Assessment & Plan Note (Signed)
Stable with no angina and good activity level.  Continue medical Rx  

## 2014-08-04 NOTE — Progress Notes (Signed)
Patient ID: [name], male   DOB: Feb 12, 1941, 73 y.o.   MRN: [id number] [name] is seen today post CABG in 2009.l He has not had any palpitaitons or dyspnea. He has leg cramps with most statins. He is tolerating lipitor qod with an LDL of 82 He has prostate issues and flomax is working fairly well.He has had recurrent hernias in the past and is having recurrent issues on the right. I gave hiim Dr [name] name as a referral. Otherwise he has been compliant with his meds and walks on a daily basis. He had some palpitations and Event monitor was benign.  Had some chest pain first part of January One episode after eating ? Indigestion lasted less than a minute. Recurred two days latter Sharp stabbing pain No pain this week   BP is running high No chest pains  Legs get heavy with exercise no rest pain    SSCP cath 2/13 Arida Patent grafts. Lesion in small unbypassed OM Rx medically    Chronic pain better and hernias ok for now      ROS: Denies fever, malais, weight loss, blurry vision, decreased visual acuity, cough, sputum, SOB, hemoptysis, pleuritic pain, palpitaitons, heartburn, abdominal pain, melena, lower extremity edema, claudication, or rash.  All other systems reviewed and negative  General: Affect appropriate Healthy:  appears stated age 73: normal Neck supple with no adenopathy JVP normal no bruits no thyromegaly Lungs clear with no wheezing and good diaphragmatic motion Heart:  S1/S2 no murmur, no rub, gallop or click PMI normal Abdomen: benighn, BS positve, no tenderness, no AAA no bruit.  No HSM or HJR Distal pulses intact with no bruits No edema Neuro non-focal Skin warm and dry No muscular weakness   Current Outpatient Prescriptions  Medication Sig Dispense Refill  . aspirin 81 MG tablet Take 81 mg by mouth daily.        Marland Kitchen atorvastatin (LIPITOR) 40 MG tablet 1/2 every other day      . clobetasol cream (TEMOVATE) 1.66 % Apply 1 application topically 2  (two) times daily.      Marland Kitchen doxycycline (VIBRAMYCIN) 100 MG capsule Take 1 capsule (100 mg total) by mouth 2 (two) times daily.  20 capsule  0  . fexofenadine (ALLEGRA) 180 MG tablet Take 180 mg by mouth 2 (two) times daily.      . metoprolol (LOPRESSOR) 50 MG tablet TAKE 1/2 TABLET TWICE A DAY  90 tablet  1  . MULTIPLE VITAMIN PO Take 1 tablet by mouth daily.       . nitroGLYCERIN (NITROSTAT) 0.4 MG SL tablet Place 1 tablet (0.4 mg total) under the tongue every 5 (five) minutes as needed for chest pain.  25 tablet  0  . NON FORMULARY Joint capsule 1 tab po qd      . Omega-3 Fatty Acids (OMEGA 3 PO) Take 1 capsule by mouth daily.      . RABEprazole (ACIPHEX) 20 MG tablet TAKE 1 TABLET DAILY  30 tablet  5  . tamsulosin (FLOMAX) 0.4 MG CAPS capsule Take 1 capsule (0.4 mg total) by mouth daily.  90 capsule  0  . trimethoprim-polymyxin b (POLYTRIM) ophthalmic solution Place 1 drop into the right eye every 4 (four) hours. After warm compresses  10 mL  0   No current facility-administered medications for this visit.    Allergies  Propoxyphene n-acetaminophen and Iodinated diagnostic agents  Electrocardiogram:  SR rate 77 low voltage otherwise normal   Assessment and Plan

## 2014-08-04 NOTE — Telephone Encounter (Signed)
PT  CALLED WANTING  TO KNOW  IF NEEDS  TO STOP  METOPROLOL  SINCE  DR NISHAN  STARTED LOSARTAN PT  AWARE  NEEDS TO  TAKE  BOTH   NOTHING  WAS  STOPPED ONLY  LOSARTAN WAS STARTED .Adonis Housekeeper

## 2014-08-04 NOTE — Assessment & Plan Note (Signed)
Not clear if leg pain is lumbar or vascular  F/U ABI;s would do exercise ABI's if resting ones normal

## 2014-08-04 NOTE — Patient Instructions (Signed)
Your physician recommends that you schedule a follow-up appointment in: NEXT  AVAILABLE WITH DR [place] Your physician has recommended you make the following change in your medication:  START LOSARTAN [address] [place] physician recommends that you return for lab work in:  [place]  [place] has requested that you have an ankle brachial index (ABI). During this test an ultrasound and blood pressure cuff are used to evaluate the arteries that supply the arms and legs with blood. Allow thirty minutes for this exam. There are no restrictions or special instructions.  WITH EXERCISE IF  RESTING  ARE NORMAL

## 2014-08-04 NOTE — Assessment & Plan Note (Signed)
Poorly controlled start Cozaar 50 mg  BMET 4 weeks f/u with me next available

## 2014-08-04 NOTE — Telephone Encounter (Signed)
° °  Patient was seen this morning and has questions about medication. Please call and advise.

## 2014-08-04 NOTE — Assessment & Plan Note (Signed)
Cholesterol is at goal.  Continue current dose of statin and diet Rx.  No myalgias or side effects.  F/U  LFT's in 6 months. Lab Results  Component Value Date   LDLCALC 99 07/27/2014

## 2014-08-04 NOTE — Assessment & Plan Note (Signed)
Maint NSR no palpitations continue asa and beta blocker

## 2014-08-11 ENCOUNTER — Other Ambulatory Visit (HOSPITAL_COMMUNITY): Payer: Self-pay | Admitting: Cardiology

## 2014-08-11 DIAGNOSIS — I739 Peripheral vascular disease, unspecified: Secondary | ICD-10-CM

## 2014-08-18 ENCOUNTER — Ambulatory Visit (HOSPITAL_COMMUNITY): Payer: Medicare Other | Attending: Cardiovascular Disease | Admitting: Cardiology

## 2014-08-18 DIAGNOSIS — I739 Peripheral vascular disease, unspecified: Secondary | ICD-10-CM | POA: Diagnosis not present

## 2014-08-18 DIAGNOSIS — I482 Chronic atrial fibrillation, unspecified: Secondary | ICD-10-CM

## 2014-08-18 DIAGNOSIS — M79606 Pain in leg, unspecified: Secondary | ICD-10-CM

## 2014-08-18 DIAGNOSIS — R202 Paresthesia of skin: Secondary | ICD-10-CM

## 2014-08-18 DIAGNOSIS — R200 Anesthesia of skin: Secondary | ICD-10-CM

## 2014-08-18 NOTE — Progress Notes (Signed)
Lower arterial doppler performed. 

## 2014-08-27 ENCOUNTER — Telehealth: Payer: Self-pay | Admitting: Cardiovascular Disease

## 2014-08-27 NOTE — Telephone Encounter (Signed)
PT'S  WIFE  AWARE OF  LOWER DOPPLER RESULTS./CY

## 2014-08-27 NOTE — Telephone Encounter (Signed)
New message ° ° ° ° ° °Returning a nurses call °

## 2014-09-18 ENCOUNTER — Other Ambulatory Visit (INDEPENDENT_AMBULATORY_CARE_PROVIDER_SITE_OTHER): Payer: Medicare Other

## 2014-09-18 DIAGNOSIS — Z79899 Other long term (current) drug therapy: Secondary | ICD-10-CM | POA: Diagnosis not present

## 2014-09-18 DIAGNOSIS — H1045 Other chronic allergic conjunctivitis: Secondary | ICD-10-CM | POA: Diagnosis not present

## 2014-09-18 DIAGNOSIS — R21 Rash and other nonspecific skin eruption: Secondary | ICD-10-CM | POA: Diagnosis not present

## 2014-09-18 DIAGNOSIS — J309 Allergic rhinitis, unspecified: Secondary | ICD-10-CM | POA: Diagnosis not present

## 2014-09-18 LAB — BASIC METABOLIC PANEL
BUN: 14 mg/dL (ref 6–23)
CALCIUM: 9.4 mg/dL (ref 8.4–10.5)
CO2: 26 mEq/L (ref 19–32)
CREATININE: 0.8 mg/dL (ref 0.4–1.5)
Chloride: 106 mEq/L (ref 96–112)
GFR: 96.02 mL/min (ref >60.00)
Glucose, Bld: 109 mg/dL — ABNORMAL HIGH (ref 70–99)
Potassium: 4.4 mEq/L (ref 3.5–5.1)
SODIUM: 138 meq/L (ref 135–145)

## 2014-09-21 ENCOUNTER — Telehealth: Payer: Self-pay | Admitting: Cardiovascular Disease

## 2014-09-21 ENCOUNTER — Other Ambulatory Visit: Payer: Self-pay | Admitting: Family Medicine

## 2014-09-21 ENCOUNTER — Ambulatory Visit (INDEPENDENT_AMBULATORY_CARE_PROVIDER_SITE_OTHER): Payer: Medicare Other | Admitting: Family Medicine

## 2014-09-21 DIAGNOSIS — Z23 Encounter for immunization: Secondary | ICD-10-CM

## 2014-09-21 MED ORDER — METOPROLOL TARTRATE 50 MG PO TABS: ORAL_TABLET | ORAL | Status: DC

## 2014-09-21 MED ORDER — RABEPRAZOLE SODIUM 20 MG PO TBEC: DELAYED_RELEASE_TABLET | ORAL | Status: DC

## 2014-09-21 MED ORDER — TAMSULOSIN HCL 0.4 MG PO CAPS: 0.4000 mg | ORAL_CAPSULE | Freq: Every day | ORAL | Status: DC

## 2014-09-21 NOTE — Telephone Encounter (Signed)
SEE LAB RESULTS ./CY 

## 2014-09-21 NOTE — Telephone Encounter (Signed)
New message ° ° ° ° ° °Returning a nurses call °

## 2014-09-22 ENCOUNTER — Ambulatory Visit (INDEPENDENT_AMBULATORY_CARE_PROVIDER_SITE_OTHER): Payer: Medicare Other | Admitting: Cardiovascular Disease

## 2014-09-22 ENCOUNTER — Other Ambulatory Visit: Payer: Self-pay | Admitting: Family Medicine

## 2014-09-22 VITALS — HR 86 | Ht 67.0 in | Wt 207.0 lb

## 2014-09-22 DIAGNOSIS — I480 Paroxysmal atrial fibrillation: Secondary | ICD-10-CM

## 2014-09-22 DIAGNOSIS — I2510 Atherosclerotic heart disease of native coronary artery without angina pectoris: Secondary | ICD-10-CM | POA: Diagnosis not present

## 2014-09-22 DIAGNOSIS — I10 Essential (primary) hypertension: Secondary | ICD-10-CM

## 2014-09-22 DIAGNOSIS — I4891 Unspecified atrial fibrillation: Secondary | ICD-10-CM

## 2014-09-22 NOTE — Assessment & Plan Note (Signed)
Maint NSR no palpitations 

## 2014-09-22 NOTE — Addendum Note (Signed)
Addended by: Devra Dopp E on: 09/22/2014 08:54 AM   Modules accepted: Orders

## 2014-09-22 NOTE — Assessment & Plan Note (Signed)
Stable with no angina and good activity level.  Continue medical Rx  

## 2014-09-22 NOTE — Progress Notes (Signed)
Patient ID: [name], male   DOB: [birth year], 73 y.o.   MRN: [id number] [name] is seen today post CABG in 2009.l He has not had any palpitaitons or dyspnea. He has leg cramps with most statins. He is tolerating lipitor qod with an LDL of 82 He has prostate issues and flomax is working fairly well.He has had recurrent hernias in the past and is having recurrent issues on the right. I gave hiim Dr Bertrum Sol name as a referral. Otherwise he has been compliant with his meds and walks on a daily basis. He had some palpitations and Event monitor was benign.  Had some chest pain first part of January One episode after eating ? Indigestion lasted less than a minute. Recurred two days latter Sharp stabbing pain No pain this week  BP is running high No chest pains Legs get heavy with exercise no rest pain  SSCP cath 2/13 Arida Patent grafts. Lesion in small unbypassed OM Rx medically  Chronic pain better and hernias ok for now   BP better with cozaar  Rash torso ? Contact on dose pack    ROS: Denies fever, malais, weight loss, blurry vision, decreased visual acuity, cough, sputum, SOB, hemoptysis, pleuritic pain, palpitaitons, heartburn, abdominal pain, melena, lower extremity edema, claudication, or rash.  All other systems reviewed and negative  General: Affect appropriate Healthy:  appears stated age 3: normal Neck supple with no adenopathy JVP normal no bruits no thyromegaly Lungs clear with no wheezing and good diaphragmatic motion Heart:  S1/S2 no murmur, no rub, gallop or click PMI normal Abdomen: benighn, BS positve, no tenderness, no AAA no bruit.  No HSM or HJR Distal pulses intact with no bruits No edema Neuro non-focal Skin warm and dry contact dermatitis on back and left leg No muscular weakness   Current Outpatient Prescriptions  Medication Sig Dispense Refill  . aspirin 81 MG tablet Take 81 mg by mouth daily.        Marland Kitchen atorvastatin (LIPITOR) 40 MG tablet 1/2 every  other day      . clobetasol cream (TEMOVATE) 7.34 % Apply 1 application topically 2 (two) times daily.      Marland Kitchen doxycycline (VIBRAMYCIN) 100 MG capsule Take 1 capsule (100 mg total) by mouth 2 (two) times daily.  20 capsule  0  . fexofenadine (ALLEGRA) 180 MG tablet Take 180 mg by mouth 2 (two) times daily.      Marland Kitchen losartan (COZAAR) 50 MG tablet Take 1 tablet (50 mg total) by mouth daily.  90 tablet  3  . metoprolol (LOPRESSOR) 50 MG tablet TAKE 1/2 TABLET TWICE A DAY  90 tablet  0  . MULTIPLE VITAMIN PO Take 1 tablet by mouth daily.       . nitroGLYCERIN (NITROSTAT) 0.4 MG SL tablet Place 1 tablet (0.4 mg total) under the tongue every 5 (five) minutes as needed for chest pain.  25 tablet  0  . NON FORMULARY Joint capsule 1 tab po qd      . Omega-3 Fatty Acids (OMEGA 3 PO) Take 1 capsule by mouth daily.      . RABEprazole (ACIPHEX) 20 MG tablet TAKE 1 TABLET DAILY  90 tablet  0  . tamsulosin (FLOMAX) 0.4 MG CAPS capsule Take 1 capsule (0.4 mg total) by mouth daily.  90 capsule  0  . trimethoprim-polymyxin b (POLYTRIM) ophthalmic solution Place 1 drop into the right eye every 4 (four) hours. After warm compresses  10 mL  0  No current facility-administered medications for this visit.    Allergies  Propoxyphene n-acetaminophen and Iodinated diagnostic agents  Electrocardiogram:  SR LVH nonspecific ST changes   Assessment and Plan

## 2014-09-22 NOTE — Assessment & Plan Note (Addendum)
Well controlled.  Continue current medications and low sodium Dash type diet.  Cozaar has improved situation

## 2014-10-11 ENCOUNTER — Other Ambulatory Visit: Payer: Self-pay | Admitting: Internal Medicine

## 2014-10-12 NOTE — Telephone Encounter (Signed)
Sent to the pharmacy by e-scribe. 

## 2014-11-03 ENCOUNTER — Ambulatory Visit (INDEPENDENT_AMBULATORY_CARE_PROVIDER_SITE_OTHER): Payer: Medicare Other | Admitting: Internal Medicine

## 2014-11-03 ENCOUNTER — Encounter: Payer: Self-pay | Admitting: Internal Medicine

## 2014-11-03 VITALS — BP 122/70 | Temp 98.9°F | Ht 67.0 in | Wt 204.0 lb

## 2014-11-03 DIAGNOSIS — I2510 Atherosclerotic heart disease of native coronary artery without angina pectoris: Secondary | ICD-10-CM

## 2014-11-03 DIAGNOSIS — M542 Cervicalgia: Secondary | ICD-10-CM | POA: Diagnosis not present

## 2014-11-03 NOTE — Progress Notes (Signed)
Pre visit review using our clinic review tool, if applicable. No additional management support is needed unless otherwise documented below in the visit note.  Chief Complaint  Patient presents with  . Pain behind/below rt ear    x4days/worsening    HPI: Patient [name]  comes in today for SDA for  new problem evaluation. 4 days  On and off and not very bad hurts to turn head.  Hurts to touch .  No dental sx.  No fever  ? If sore throat . Runny nose   Sinus pill s  Headache with nose spray.  No change in vision blurry at times. No rash pain goes up to the occiput of head but is based in his right neck  History of acute torticollis as a child in middle school with severe neck pain. 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 VITAMIN D DEFICIENCY 11/02/2009  . HYPERLIPIDEMIA 07/25/2007  . HYPERTENSION 07/23/2007  . CAD 01/22/2009  . Atrial fibrillation 07/06/2009  . 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  . RHINITIS, CHRONIC 05/11/2010  . ALLERGIC RHINITIS 07/23/2007  . GERD 07/25/2007  . HERNIA 07/06/2009  . 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 BENIGN PROSTATIC HYPERTROPHY 07/25/2007  . BACK PAIN, LUMBAR 10/25/2010  . LUMBAR RADICULOPATHY, RIGHT 10/18/2007  . BACK PAIN 11/02/2009  . MUSCLE PAIN 01/22/2009  . NEURITIS 10/25/2010  . DIZZINESS 01/22/2009  . Palpitations 05/09/2010  . DYSPNEA ON EXERTION 08/21/2008  . Cough 10/25/2010  . RUQ PAIN 01/22/2009  . HYPERGLYCEMIA, BORDERLINE 12/31/2007  . ECHOCARDIOGRAM, ABNORMAL 08/19/2007  . UNS ADVRS EFF OTH RX MEDICINAL&BIOLOGICAL SBSTNC 04/15/2008  . HX, PERSONAL, MALIGNANCY, SKIN NEC 10/18/2007  . COLONIC POLYPS, HX OF 10/18/2007  . Sinus polyp   . Voice strain   . Arthritis   . Cancer       skin  . Nausea   . Inguinal hernia   . Abdominal pain, right lateral 02/21/2012    progressive.      Family History  Problem Relation Age of Onset  . Lung cancer Mother   . Cancer Mother   . Heart attack Father   . Hypertension Father   . Alcohol abuse Father   . Heart disease Father   . Breast cancer Sister   . Cancer Sister     breast  . Alcohol abuse Brother   . Hypertension Brother   . Alcohol abuse Brother   . Hypertension Brother   . Alcohol abuse Brother   . Hypertension Brother   . Kidney disease Sister     History   Social History  . Marital Status: Married    Spouse Name: N/A    Number of Children: N/A  . Years of Education: N/A   Social History Main Topics  . Smoking status: Former Smoker    Types: Cigarettes    Quit date: 04/02/1964  . Smokeless tobacco: Never Used     Comment: in early 20's he quit  . Alcohol Use: No     Comment: socially  . Drug Use: No  . Sexual Activity: None   Other Topics Concern  . None   Social History Narrative   Retired [employer]   Married   Non smoker  some etoh   Travels a lot             Outpatient Encounter Prescriptions as of 11/03/2014  Medication Sig  . aspirin 81 MG tablet Take 81 mg by mouth daily.    Marland Kitchen atorvastatin (LIPITOR) 40 MG tablet TAKE 1 TABLET BY MOUTH EVERY OTHER DAY  . azelastine (ASTELIN) 0.1 % nasal spray Place into both nostrils 2 (two) times daily. Use in each nostril as directed  . azelastine (OPTIVAR) 0.05 % ophthalmic solution Place one drop into the affected eye twice daily.  . clobetasol cream (TEMOVATE) 8.11 % Apply 1 application topically 2 (two) times daily.  . fexofenadine (ALLEGRA) 180 MG tablet Take 180 mg by mouth 2 (two) times daily.  . fluticasone (FLONASE) 50 MCG/ACT nasal spray 1-2 SPRAYS IN EACH NOSTRIL DAILY  . hydrOXYzine (ATARAX/VISTARIL) 25 MG tablet Take 25 mg by mouth 2 (two) times daily.  Marland Kitchen levocetirizine (XYZAL) 5 MG tablet Take 5 mg by mouth every evening.   Marland Kitchen losartan (COZAAR) 50 MG tablet Take 1 tablet (50 mg total) by mouth daily.  . metoprolol (LOPRESSOR) 50 MG tablet TAKE 1/2 TABLET TWICE A DAY  . MULTIPLE VITAMIN PO Take 1 tablet by mouth daily.   . NON FORMULARY Joint capsule 1 tab po qd  . Omega-3 Fatty Acids (OMEGA 3 PO) Take 1 capsule by mouth daily.  . predniSONE (DELTASONE) 10 MG tablet Take 10 mg by mouth as directed.  . RABEprazole (ACIPHEX) 20 MG tablet TAKE 1 TABLET DAILY  . tamsulosin (FLOMAX) 0.4 MG CAPS capsule Take 1 capsule (0.4 mg total) by mouth daily.  Marland Kitchen trimethoprim-polymyxin b (POLYTRIM) ophthalmic solution Place 1 drop into the right eye every 4 (four) hours. After warm compresses  . nitroGLYCERIN (NITROSTAT) 0.4 MG SL tablet Place 1 tablet (0.4 mg total) under the tongue every 5 (five) minutes as needed for chest pain.    EXAM:  BP 122/70 mmHg  Temp(Src) 98.9 F (37.2 C) (Oral)  Ht 5\' 7"  (1.702 m)  Wt 204 lb (92.534 kg)  BMI 31.94 kg/m2  Body mass index is 31.94 kg/(m^2).  GENERAL: vitals reviewed and listed above, alert, oriented, appears well hydrated and in no acute distress HEENT: atraumatic, conjunctiva  clear, no obvious abnormalities on inspection of external nose and earsTMs are intact no pain in ear canal OP : no lesion edema or exudate  NECK: no obvious masses on inspection palpation somewhat foreshortened area of discomfort is right below the ear at the attachment paracervical muscles behind the sternocleidomastoid. No adenopathies obvious range of motion is slightly limited stiff to the right no specific bruits noted her anterior pain LUNGS: clear to auscultation bilaterally, no wheezes, rales or rhonchi, good air movement CV: HRRR, no clubbing cyanosis or  peripheral edema nl cap refill  MS: moves all extremities without noticeable focal  abnormality PSYCH: pleasant and cooperative, no obvious depression or anxietymild discomfort  ASSESSMENT AND PLAN:  Discussed the following assessment and  plan:  Neck pain on right side - no soign ifnec or vasc cause. poss djd soine pain .sx rex andf olloe reeval if needed  Acts like musculoskeletal pain but he is not one to complain much. Discussed differential diagnosis of her breaks out in rash don't think it's vascular or neurologic at this point can use Tylenol comfort measures declined Ultram or stronger medicines at this time if persistent progressive consider further evaluation. -Patient advised to return or notify health care team  if symptoms worsen ,  persist or new concerns arise.  Patient Instructions  I  dont see cause of the pain although seems to be related to the neck itself   No masses ear is ok and doesn't  seem vascular cause.  Tylenol.   or stronger if you need this    Consider  neck x ray or further evaluation.   Contact us if  persistent or progressive        Mariann Laster K. Panosh M.D.

## 2014-11-03 NOTE — Patient Instructions (Signed)
I  dont see cause of the pain although seems to be related to the neck itself   No masses ear is ok and doesn't  seem vascular cause.  Tylenol.   or stronger if you need this    Consider  neck x ray or further evaluation.   Contact us if  persistent or progressive

## 2015-01-05 ENCOUNTER — Other Ambulatory Visit: Payer: Self-pay | Admitting: Internal Medicine

## 2015-03-28 NOTE — Progress Notes (Signed)
Patient ID: [name], male   DOB: 10-01-1941, 74 y.o.   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 He has leg cramps with most statins. He is tolerating lipitor qod with an LDL of 82 He has prostate issues and flomax is working fairly well.He has had recurrent hernias in the past and is having recurrent issues on the right. I gave hiim Dr Bertrum Sol name as a referral. Otherwise he has been compliant with his meds and walks on a daily basis. He had some palpitations and Event monitor was benign.   SSCP cath 2/13 Arida Patent grafts. Lesion in small unbypassed OM Rx medically   BP better with cozaar    Needs new nitro  Daughter living in [place] and they usually visit for a month at a time   ROS: Denies fever, malais, weight loss, blurry vision, decreased visual acuity, cough, sputum, SOB, hemoptysis, pleuritic pain, palpitaitons, heartburn, abdominal pain, melena, lower extremity edema, claudication, or rash.  All other systems reviewed and negative  General: Affect appropriate Healthy:  appears stated age 74: normal Neck supple with no adenopathy JVP normal no bruits no thyromegaly Lungs clear with no wheezing and good diaphragmatic motion Heart:  S1/S2 no murmur, no rub, gallop or click PMI normal Abdomen: benighn, BS positve, no tenderness, no AAA no bruit.  No HSM or HJR Distal pulses intact with no bruits No edema Neuro non-focal Skin warm and dry contact dermatitis on back and left leg No muscular weakness   Current Outpatient Prescriptions  Medication Sig Dispense Refill  . aspirin 81 MG tablet Take 81 mg by mouth daily.      Marland Kitchen atorvastatin (LIPITOR) 40 MG tablet TAKE 1 TABLET BY MOUTH EVERY OTHER DAY 45 tablet 2  . azelastine (ASTELIN) 0.1 % nasal spray Place into both nostrils 2 (two) times daily. Use in each nostril as directed    . azelastine (OPTIVAR) 0.05 % ophthalmic solution Place one drop into the  affected eye twice daily.    . clobetasol cream (TEMOVATE) 2.83 % Apply 1 application topically 2 (two) times daily.    . fluticasone (FLONASE) 50 MCG/ACT nasal spray 1-2 SPRAYS IN EACH NOSTRIL DAILY    . losartan (COZAAR) 50 MG tablet Take 1 tablet (50 mg total) by mouth daily. 90 tablet 3  . metoprolol (LOPRESSOR) 50 MG tablet TAKE 1/2 TABLET TWICE A DAY 90 tablet 2  . MULTIPLE VITAMIN PO Take 1 tablet by mouth daily.     . nitroGLYCERIN (NITROSTAT) 0.4 MG SL tablet Place 1 tablet (0.4 mg total) under the tongue every 5 (five) minutes as needed for chest pain. 25 tablet 0  . NON FORMULARY Joint capsule 1 tab po qd    . Omega-3 Fatty Acids (OMEGA 3 PO) Take 1 capsule by mouth daily.    . RABEprazole (ACIPHEX) 20 MG tablet TAKE 1 TABLET BY MOUTH ONCE DAILY 90 tablet 0  . tamsulosin (FLOMAX) 0.4 MG CAPS capsule Take 1 capsule (0.4 mg total) by mouth daily. 90 capsule 0   No current facility-administered medications for this visit.    Allergies  Propoxyphene n-acetaminophen and Iodinated diagnostic agents  Electrocardiogram:  09/22/14  SR LVH nonspecific ST changes   Assessment and Plan CAD:  Stable with no angina and good activity level.  Continue medical Rx New nitro called in Chol;  Continue statin labs with primary Seasonal allergies:  Continue flonase   Reflux:  Discussed eating less chocolate , low carb and not eating late night  Aciphex

## 2015-03-29 ENCOUNTER — Encounter: Payer: Self-pay | Admitting: Cardiovascular Disease

## 2015-03-29 ENCOUNTER — Ambulatory Visit (INDEPENDENT_AMBULATORY_CARE_PROVIDER_SITE_OTHER): Payer: Medicare Other | Admitting: Cardiovascular Disease

## 2015-03-29 VITALS — BP 140/78 | HR 73 | Ht 67.0 in | Wt 203.0 lb

## 2015-03-29 DIAGNOSIS — I2510 Atherosclerotic heart disease of native coronary artery without angina pectoris: Secondary | ICD-10-CM

## 2015-03-29 MED ORDER — NITROGLYCERIN 0.4 MG SL SUBL: 0.4000 mg | SUBLINGUAL_TABLET | SUBLINGUAL | Status: DC | PRN

## 2015-03-29 NOTE — Patient Instructions (Addendum)
Your physician wants you to follow-up in:  6 MONTHS WITH DR NISHAN  You will receive a reminder letter in the mail two months in advance. If you don't receive a letter, please call our office to schedule the follow-up appointment. Your physician recommends that you continue on your current medications as directed. Please refer to the Current Medication list given to you today. 

## 2015-04-03 ENCOUNTER — Other Ambulatory Visit: Payer: Self-pay | Admitting: Internal Medicine

## 2015-06-28 ENCOUNTER — Other Ambulatory Visit: Payer: Self-pay | Admitting: Internal Medicine

## 2015-06-30 ENCOUNTER — Other Ambulatory Visit: Payer: Self-pay | Admitting: Internal Medicine

## 2015-06-30 NOTE — Telephone Encounter (Signed)
Sent to the pharmacy by e-scribe.  Pt has upcoming appt on 10/01/15

## 2015-07-01 ENCOUNTER — Other Ambulatory Visit: Payer: Self-pay | Admitting: Internal Medicine

## 2015-07-01 NOTE — Telephone Encounter (Signed)
Sent to the pharmacy by e-scribe.  Pt has upcoming appt on 10/01/15

## 2015-07-09 ENCOUNTER — Other Ambulatory Visit: Payer: Self-pay | Admitting: Internal Medicine

## 2015-07-14 ENCOUNTER — Other Ambulatory Visit: Payer: Self-pay | Admitting: Physician Assistant

## 2015-07-14 DIAGNOSIS — B359 Dermatophytosis, unspecified: Secondary | ICD-10-CM | POA: Diagnosis not present

## 2015-07-14 DIAGNOSIS — L820 Inflamed seborrheic keratosis: Secondary | ICD-10-CM | POA: Diagnosis not present

## 2015-07-14 DIAGNOSIS — D485 Neoplasm of uncertain behavior of skin: Secondary | ICD-10-CM | POA: Diagnosis not present

## 2015-07-14 DIAGNOSIS — L821 Other seborrheic keratosis: Secondary | ICD-10-CM | POA: Diagnosis not present

## 2015-08-18 DIAGNOSIS — Z23 Encounter for immunization: Secondary | ICD-10-CM | POA: Diagnosis not present

## 2015-08-24 ENCOUNTER — Telehealth: Payer: Self-pay | Admitting: Internal Medicine

## 2015-08-24 NOTE — Telephone Encounter (Signed)
Pt needs 90 day rxs on tamsulosin,atorvastatin,losartan and metoprolol . [facility]phone # [phone] and address [address].

## 2015-08-25 MED ORDER — TAMSULOSIN HCL 0.4 MG PO CAPS: 0.4000 mg | ORAL_CAPSULE | Freq: Every day | ORAL | Status: DC

## 2015-08-25 MED ORDER — METOPROLOL TARTRATE 50 MG PO TABS: 25.0000 mg | ORAL_TABLET | Freq: Two times a day (BID) | ORAL | Status: DC

## 2015-08-25 MED ORDER — ATORVASTATIN CALCIUM 40 MG PO TABS: 40.0000 mg | ORAL_TABLET | ORAL | Status: DC

## 2015-08-25 NOTE — Telephone Encounter (Signed)
Faxed to the pharmacy @ [phone].  Will send to Dr. Johnsie Cancel for refills of losartan.

## 2015-08-26 MED ORDER — LOSARTAN POTASSIUM 50 MG PO TABS: 50.0000 mg | ORAL_TABLET | Freq: Every day | ORAL | Status: DC

## 2015-08-26 NOTE — Telephone Encounter (Signed)
Ok to refill losartan

## 2015-08-26 NOTE — Telephone Encounter (Signed)
SCRIPT  FAXED TO  [facility]

## 2015-09-25 ENCOUNTER — Other Ambulatory Visit: Payer: Self-pay | Admitting: Internal Medicine

## 2015-10-01 ENCOUNTER — Encounter: Payer: Medicare Other | Admitting: Internal Medicine

## 2015-10-10 ENCOUNTER — Other Ambulatory Visit: Payer: Self-pay | Admitting: Internal Medicine

## 2015-10-12 NOTE — Telephone Encounter (Signed)
Sent to the pharmacy by e-scribe.  Pt has upcoming cpx 01/2016.

## 2015-10-20 ENCOUNTER — Other Ambulatory Visit: Payer: Self-pay | Admitting: Internal Medicine

## 2015-10-21 NOTE — Telephone Encounter (Signed)
Sent to the pharmacy by e-scribe. 

## 2016-01-09 ENCOUNTER — Other Ambulatory Visit: Payer: Self-pay | Admitting: Internal Medicine

## 2016-01-10 NOTE — Telephone Encounter (Signed)
FILLED ON 10/12/2015 FOR SIX MONTHS.  REQUEST IS EARLY

## 2016-02-07 NOTE — Progress Notes (Signed)
Patient ID: [name], male   DOB: [birth year], 75 y.o.   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 He has leg cramps with most statins. He is tolerating lipitor qod with an LDL of 103  He has prostate issues and flomax is working fairly well.He has had recurrent hernias in the past and is having recurrent issues on the right. I gave hiim [name] name as a referral. Otherwise he has been compliant with his meds and walks on a daily basis. He had some palpitations and Event monitor was benign.   SSCP cath 2/13 [name] grafts. Lesion in small unbypassed OM Rx medically   BP better with cozaar    Needs new nitro  Daughter living in [place] and they usually visit for a month at a time  She  [name]) and her partner [name] had a baby boy [name] that was Born prematurely but everyone is fine now.  [name] was in [place] for 4 months   Some bone pain over sternum    ROS: Denies fever, malais, weight loss, blurry vision, decreased visual acuity, cough, sputum, SOB, hemoptysis, pleuritic pain, palpitaitons, heartburn, abdominal pain, melena, lower extremity edema, claudication, or rash.  All other systems reviewed and negative  General: Affect appropriate Healthy:  appears stated age 75: normal Neck supple with no adenopathy JVP normal no bruits no thyromegaly Lungs clear with no wheezing and good diaphragmatic motion Heart:  S1/S2 no murmur, no rub, gallop or click PMI normal Abdomen: benighn, BS positve, no tenderness, no AAA no bruit.  No HSM or HJR Distal pulses intact with no bruits No edema Neuro non-focal Skin warm and dry contact dermatitis on back and left leg No muscular weakness   Current Outpatient Prescriptions  Medication Sig Dispense Refill  . aspirin 81 MG tablet Take 81 mg by mouth daily.      Marland Kitchen atorvastatin (LIPITOR) 40 MG tablet Take 1 tablet (40 mg total) by mouth every other day. 45 tablet 1   . azelastine (OPTIVAR) 0.05 % ophthalmic solution Place 1 drop into both eyes daily.     . clobetasol cream (TEMOVATE) [id number] % Apply 1 application topically 2 (two) times daily.    Marland Kitchen losartan (COZAAR) 50 MG tablet Take 1 tablet (50 mg total) by mouth daily. 90 tablet 3  . metoprolol (LOPRESSOR) 50 MG tablet Take 0.5 tablets (25 mg total) by mouth 2 (two) times daily. 90 tablet 3  . MULTIPLE VITAMIN PO Take 1 tablet by mouth daily.     . nitroGLYCERIN (NITROSTAT) 0.4 MG SL tablet Place 1 tablet (0.4 mg total) under the tongue every 5 (five) minutes as needed for chest pain. 25 tablet 4  . RABEprazole (ACIPHEX) 20 MG tablet TAKE 1 TABLET BY MOUTH ONCE DAILY 90 tablet 1  . tamsulosin (FLOMAX) 0.4 MG CAPS capsule Take 1 capsule (0.4 mg total) by mouth daily. 90 capsule 3   No current facility-administered medications for this visit.    Allergies  Propoxyphene n-acetaminophen and Iodinated diagnostic agents  Electrocardiogram:  09/22/14  SR LVH nonspecific ST changes  02/14/16  SR rate 80  PVC  Poor R wave progression   Assessment and Plan CAD:  Stable with no angina and good activity level.  Continue medical Rx New nitro called in Chol;  Continue statin labs with primary Seasonal allergies:  Continue flonase   Reflux:  Discussed eating less chocolate ,  low carb and not eating late night  Aciphex Hernia:  Non incarcerated stabel   [name]

## 2016-02-09 ENCOUNTER — Encounter: Payer: Self-pay | Admitting: Internal Medicine

## 2016-02-09 ENCOUNTER — Ambulatory Visit (INDEPENDENT_AMBULATORY_CARE_PROVIDER_SITE_OTHER): Payer: Medicare Other | Admitting: Internal Medicine

## 2016-02-09 VITALS — BP 132/90 | Temp 98.7°F | Ht 67.5 in | Wt 200.0 lb

## 2016-02-09 DIAGNOSIS — R351 Nocturia: Secondary | ICD-10-CM | POA: Diagnosis not present

## 2016-02-09 DIAGNOSIS — R739 Hyperglycemia, unspecified: Secondary | ICD-10-CM | POA: Diagnosis not present

## 2016-02-09 DIAGNOSIS — Z0000 Encounter for general adult medical examination without abnormal findings: Secondary | ICD-10-CM | POA: Diagnosis not present

## 2016-02-09 DIAGNOSIS — R233 Spontaneous ecchymoses: Secondary | ICD-10-CM

## 2016-02-09 DIAGNOSIS — Z951 Presence of aortocoronary bypass graft: Secondary | ICD-10-CM

## 2016-02-09 DIAGNOSIS — Z23 Encounter for immunization: Secondary | ICD-10-CM

## 2016-02-09 DIAGNOSIS — I700 Atherosclerosis of aorta: Secondary | ICD-10-CM | POA: Diagnosis not present

## 2016-02-09 DIAGNOSIS — I10 Essential (primary) hypertension: Secondary | ICD-10-CM | POA: Diagnosis not present

## 2016-02-09 DIAGNOSIS — E785 Hyperlipidemia, unspecified: Secondary | ICD-10-CM

## 2016-02-09 DIAGNOSIS — Z79899 Other long term (current) drug therapy: Secondary | ICD-10-CM | POA: Diagnosis not present

## 2016-02-09 LAB — BASIC METABOLIC PANEL
BUN: 15 mg/dL (ref 6–23)
CHLORIDE: 107 meq/L (ref 96–112)
CO2: 28 meq/L (ref 19–32)
CREATININE: 0.79 mg/dL (ref 0.40–1.50)
Calcium: 9.6 mg/dL (ref 8.4–10.5)
GFR: 101.27 mL/min (ref >60.00)
GLUCOSE: 110 mg/dL — AB (ref 70–99)
Potassium: 4.8 mEq/L (ref 3.5–5.1)
Sodium: 142 mEq/L (ref 135–145)

## 2016-02-09 LAB — LIPID PANEL
CHOL/HDL RATIO: 4
Cholesterol: 159 mg/dL (ref 0–200)
HDL: 41.3 mg/dL (ref >39.00)
LDL CALC: 103 mg/dL — AB (ref 0–99)
NONHDL: 117.28
Triglycerides: 69 mg/dL (ref 0.0–149.0)
VLDL: 13.8 mg/dL (ref 0.0–40.0)

## 2016-02-09 LAB — CBC WITH DIFFERENTIAL/PLATELET
BASOS PCT: 0.7 % (ref 0.0–3.0)
Basophils Absolute: 0.1 10*3/uL (ref 0.0–0.1)
EOS ABS: 0.2 10*3/uL (ref 0.0–0.7)
Eosinophils Relative: 3.4 % (ref 0.0–5.0)
HCT: 46.6 % (ref 39.0–52.0)
Hemoglobin: 15.8 g/dL (ref 13.0–17.0)
Lymphocytes Relative: 22.5 % (ref 12.0–46.0)
Lymphs Abs: 1.6 10*3/uL (ref 0.7–4.0)
MCHC: 33.9 g/dL (ref 30.0–36.0)
MCV: 96.1 fl (ref 78.0–100.0)
MONO ABS: 0.9 10*3/uL (ref 0.1–1.0)
Monocytes Relative: 13.2 % — ABNORMAL HIGH (ref 3.0–12.0)
NEUTROS ABS: 4.2 10*3/uL (ref 1.4–7.7)
Neutrophils Relative %: 60.2 % (ref 43.0–77.0)
PLATELETS: 239 10*3/uL (ref 150.0–400.0)
RBC: 4.85 Mil/uL (ref 4.22–5.81)
RDW: 13.7 % (ref 11.5–15.5)
WBC: 6.9 10*3/uL (ref 4.0–10.5)

## 2016-02-09 LAB — HEPATIC FUNCTION PANEL
ALT: 26 U/L (ref 0–53)
AST: 16 U/L (ref 0–37)
Albumin: 4.3 g/dL (ref 3.5–5.2)
Alkaline Phosphatase: 58 U/L (ref 39–117)
BILIRUBIN DIRECT: 0.1 mg/dL (ref 0.0–0.3)
BILIRUBIN TOTAL: 0.7 mg/dL (ref 0.2–1.2)
Total Protein: 6.6 g/dL (ref 6.0–8.3)

## 2016-02-09 LAB — HEMOGLOBIN A1C: Hgb A1c MFr Bld: 5.7 % (ref 4.6–6.5)

## 2016-02-09 LAB — TSH: TSH: 1.65 u[IU]/mL (ref 0.35–4.50)

## 2016-02-09 MED ORDER — TAMSULOSIN HCL 0.4 MG PO CAPS: 0.4000 mg | ORAL_CAPSULE | Freq: Every day | ORAL | Status: DC

## 2016-02-09 MED ORDER — METOPROLOL TARTRATE 50 MG PO TABS: 25.0000 mg | ORAL_TABLET | Freq: Two times a day (BID) | ORAL | Status: DC

## 2016-02-09 NOTE — Patient Instructions (Addendum)
Refer  urology about opinion other interventions for   Nocturia  .  But avoid  Tea  4 hours before bed.  Pneumovax    Today  Will notify you  of labs when available. ROV yearly depending on results of tests and how ou are doing  Get your eye check    Health Maintenance, Male A healthy lifestyle and preventative care can promote health and wellness.  Maintain regular health, dental, and eye exams.  Eat a healthy diet. Foods like vegetables, fruits, whole grains, low-fat dairy products, and lean protein foods contain the nutrients you need and are low in calories. Decrease your intake of foods high in solid fats, added sugars, and salt. Get information about a proper diet from your health care provider, if necessary.  Regular physical exercise is one of the most important things you can do for your health. Most adults should get at least 150 minutes of moderate-intensity exercise (any activity that increases your heart rate and causes you to sweat) each week. In addition, most adults need muscle-strengthening exercises on 2 or more days a week.   Maintain a healthy weight. The body mass index (BMI) is a screening tool to identify possible weight problems. It provides an estimate of body fat based on height and weight. Your health care provider can find your BMI and can help you achieve or maintain a healthy weight. For males 20 years and older:  A BMI below 18.5 is considered underweight.  A BMI of 18.5 to 24.9 is normal.  A BMI of 25 to 29.9 is considered overweight.  A BMI of 30 and above is considered obese.  Maintain normal blood lipids and cholesterol by exercising and minimizing your intake of saturated fat. Eat a balanced diet with plenty of fruits and vegetables. Blood tests for lipids and cholesterol should begin at age 85 and be repeated every 5 years. If your lipid or cholesterol levels are high, you are over age 52, or you are at high risk for heart disease, you may need your  cholesterol levels checked more frequently.Ongoing high lipid and cholesterol levels should be treated with medicines if diet and exercise are not working.  If you smoke, find out from your health care provider how to quit. If you do not use tobacco, do not start.  Lung cancer screening is recommended for adults aged 50-80 years who are at high risk for developing lung cancer because of a history of smoking. A yearly low-dose CT scan of the lungs is recommended for people who have at least a 30-pack-year history of smoking and are current smokers or have quit within the past 15 years. A pack year of smoking is smoking an average of 1 pack of cigarettes a day for 1 year (for example, a 30-pack-year history of smoking could mean smoking 1 pack a day for 30 years or 2 packs a day for 15 years). Yearly screening should continue until the smoker has stopped smoking for at least 15 years. Yearly screening should be stopped for people who develop a health problem that would prevent them from having lung cancer treatment.  If you choose to drink alcohol, do not have more than 2 drinks per day. One drink is considered to be 12 oz (360 mL) of beer, 5 oz (150 mL) of wine, or 1.5 oz (45 mL) of liquor.  Avoid the use of street drugs. Do not share needles with anyone. Ask for help if you need support or instructions  about stopping the use of drugs.  High blood pressure causes heart disease and increases the risk of stroke. High blood pressure is more likely to develop in:  People who have blood pressure in the end of the normal range (100-139/85-89 mm Hg).  People who are overweight or obese.  People who are African American.  If you are 44-84 years of age, have your blood pressure checked every 3-5 years. If you are 31 years of age or older, have your blood pressure checked every year. You should have your blood pressure measured twice--once when you are at a hospital or clinic, and once when you are not at a  hospital or clinic. Record the average of the two measurements. To check your blood pressure when you are not at a hospital or clinic, you can use:  An automated blood pressure machine at a pharmacy.  A home blood pressure monitor.  If you are 43-57 years old, ask your health care provider if you should take aspirin to prevent heart disease.  Diabetes screening involves taking a blood sample to check your fasting blood sugar level. This should be done once every 3 years after age 49 if you are at a normal weight and without risk factors for diabetes. Testing should be considered at a younger age or be carried out more frequently if you are overweight and have at least 1 risk factor for diabetes.  Colorectal cancer can be detected and often prevented. Most routine colorectal cancer screening begins at the age of 39 and continues through age 73. However, your health care provider may recommend screening at an earlier age if you have risk factors for colon cancer. On a yearly basis, your health care provider may provide home test kits to check for hidden blood in the stool. A small camera at the end of a tube may be used to directly examine the colon (sigmoidoscopy or colonoscopy) to detect the earliest forms of colorectal cancer. Talk to your health care provider about this at age 28 when routine screening begins. A direct exam of the colon should be repeated every 5-10 years through age 33, unless early forms of precancerous polyps or small growths are found.  People who are at an increased risk for hepatitis B should be screened for this virus. You are considered at high risk for hepatitis B if:  You were born in a country where hepatitis B occurs often. Talk with your health care provider about which countries are considered high risk.  Your parents were born in a high-risk country and you have not received a shot to protect against hepatitis B (hepatitis B vaccine).  You have HIV or AIDS.  You  use needles to inject street drugs.  You live with, or have sex with, someone who has hepatitis B.  You are a man who has sex with other men (MSM).  You get hemodialysis treatment.  You take certain medicines for conditions like cancer, organ transplantation, and autoimmune conditions.  Hepatitis C blood testing is recommended for all people born from [birth year] through 1967 and any individual with known risk factors for hepatitis C.  Healthy men should no longer receive prostate-specific antigen (PSA) blood tests as part of routine cancer screening. Talk to your health care provider about prostate cancer screening.  Testicular cancer screening is not recommended for adolescents or adult males who have no symptoms. Screening includes self-exam, a health care provider exam, and other screening tests. Consult with your health care provider  about any symptoms you have or any concerns you have about testicular cancer.  Practice safe sex. Use condoms and avoid high-risk sexual practices to reduce the spread of sexually transmitted infections (STIs).  You should be screened for STIs, including gonorrhea and chlamydia if:  You are sexually active and are younger than 24 years.  You are older than 24 years, and your health care provider tells you that you are at risk for this type of infection.  Your sexual activity has changed since you were last screened, and you are at an increased risk for chlamydia or gonorrhea. Ask your health care provider if you are at risk.  If you are at risk of being infected with HIV, it is recommended that you take a prescription medicine daily to prevent HIV infection. This is called pre-exposure prophylaxis (PrEP). You are considered at risk if:  You are a man who has sex with other men (MSM).  You are a heterosexual man who is sexually active with multiple partners.  You take drugs by injection.  You are sexually active with a partner who has HIV.  Talk with  your health care provider about whether you are at high risk of being infected with HIV. If you choose to begin PrEP, you should first be tested for HIV. You should then be tested every 3 months for as long as you are taking PrEP.  Use sunscreen. Apply sunscreen liberally and repeatedly throughout the day. You should seek shade when your shadow is shorter than you. Protect yourself by wearing long sleeves, pants, a wide-brimmed hat, and sunglasses year round whenever you are outdoors.  Tell your health care provider of new moles or changes in moles, especially if there is a change in shape or color. Also, tell your health care provider if a mole is larger than the size of a pencil eraser.  A one-time screening for abdominal aortic aneurysm (AAA) and surgical repair of large AAAs by ultrasound is recommended for men aged 59-75 years who are current or former smokers.  Stay current with your vaccines (immunizations).   This information is not intended to replace advice given to you by your health care provider. Make sure you discuss any questions you have with your health care provider.   Document Released: 06/08/2008 Document Revised: 01/01/2015 Document Reviewed: 05/08/2011 Elsevier Interactive Patient Education [employer].

## 2016-02-09 NOTE — Progress Notes (Signed)
Pre visit review using our clinic review tool, if applicable. No additional management support is needed unless otherwise documented below in the visit note. \ Chief Complaint  Patient presents with  . Medicare Wellness    medicatoin flomax  still has nocturia    HPI: [name] 75 y.o. comes in today for Preventive Medicare wellness visit .and   Chronic disease management here with wife   Was in [place] for 7 months  Family grandchild help.  Sinus bothered him there eye irritations  Some better  Back in [place]   Need med refill   Nocturia [id number] per night uncertain if  flomax helping that much no hematuria  Pain .  No hx of other meds   To see cards next week.  No angina but  Site of surgery sterna feels uncofortable with certain movements and position in bed .  LIPID med no se as taken   Health Maintenance  Topic Date Due  . INFLUENZA VACCINE  07/25/2016  . TETANUS/TDAP  08/17/2025  . ZOSTAVAX  Completed  . PNA vac Low Risk Adult  Completed   Health Maintenance Review LIFESTYLE:  TAD neg td  Some etoh Sugar beverages:n Sleep: 6   MEDICARE DOCUMENT QUESTIONS  TO SCAN   Hearing: ok some dec  Seems ok  High frequency   Vision:  No limitations at present . Last eye check UTD  Safety:  Has smoke detector and wears seat belts.  No firearms. No excess sun exposure. Sees dentist regularly.  Falls: n  Advance directive :  Reviewed  Has one.  Memory: Felt to be good  , no concern from pt orfamily.  Depression: No anhedonia unusual crying or depressive symptoms  Nutrition: Eats well balanced diet; adequate calcium and vitamin D. No swallowing chewing problems.  Injury: no major injuries in the last six months.  Other healthcare providers:  Reviewed today .  Social:  Lives with spouse married. No pets.  Luz Lex a lot with family   Preventive parameters: up-to-date  Reviewed   ADLS:   There are no problems or need for assistance  driving, feeding, obtaining food,  dressing, toileting and bathing, managing money using phone.  independent.     ROS:  Bladder,  Ms neck , allergy sinus off abndib  spots on hands resolve look like brusies  flat GEN/ HEENT: No fever, significant weight changes sweats headaches vision problems hearing changes, CV/ PULM; No chest pain shortness of breath cough, syncope,edema  change in exercise tolerance. GI /GU: No adominal pain, vomiting, change in bowel habits. No blood in the stool. No significant GU symptoms. SKIN/HEME: ,no acute skin rashes suspicious lesions or bleeding. No lymphadenopathy, nodules, masses.  NEURO/ PSYCH:  No neurologic signs such as weakness numbness. No depression anxiety. IMM/ Allergy: No unusual infections.  Allergy .   REST of 12 system review negative except as per HPI   Past Medical History  Diagnosis Date  . 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 VITAMIN D DEFICIENCY 11/02/2009  . HYPERLIPIDEMIA 07/25/2007  . HYPERTENSION 07/23/2007  . CAD 01/22/2009  . Atrial fibrillation ([place]) 07/06/2009  . 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  . RHINITIS, CHRONIC 05/11/2010  . ALLERGIC RHINITIS 07/23/2007  . GERD 07/25/2007  . HERNIA 07/06/2009  . 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 BENIGN PROSTATIC HYPERTROPHY 07/25/2007  . BACK PAIN, LUMBAR 10/25/2010  . LUMBAR RADICULOPATHY, RIGHT 10/18/2007  . BACK PAIN 11/02/2009  . MUSCLE PAIN 01/22/2009  . NEURITIS 10/25/2010  . DIZZINESS  01/22/2009  . Palpitations 05/09/2010  . DYSPNEA ON EXERTION 08/21/2008  . Cough 10/25/2010  . RUQ PAIN 01/22/2009  . HYPERGLYCEMIA, BORDERLINE 12/31/2007  . ECHOCARDIOGRAM, ABNORMAL 08/19/2007  . UNS ADVRS EFF OTH RX MEDICINAL&BIOLOGICAL SBSTNC 04/15/2008  . HX, PERSONAL, MALIGNANCY, SKIN NEC 10/18/2007  . COLONIC POLYPS, HX OF 10/18/2007  . Sinus polyp   . Voice strain   . Arthritis   . Cancer ([place])     skin  . Nausea   . Inguinal hernia   . Abdominal pain, right lateral 02/21/2012    progressive.      Family History  Problem Relation  Age of Onset  . Lung cancer Mother   . Cancer Mother   . Heart attack Father   . Hypertension Father   . Alcohol abuse Father   . Heart disease Father   . Breast cancer Sister   . Cancer Sister     breast  . Alcohol abuse Brother   . Hypertension Brother   . Alcohol abuse Brother   . Hypertension Brother   . Alcohol abuse Brother   . Hypertension Brother   . Kidney disease Sister     Social History   Social History  . Marital Status: Married    Spouse Name: N/A  . Number of Children: N/A  . Years of Education: N/A   Social History Main Topics  . Smoking status: Former Smoker    Types: Cigarettes    Quit date: 04/02/1964  . Smokeless tobacco: Never Used     Comment: in early 20's he quit  . Alcohol Use: No     Comment: socially  . Drug Use: No  . Sexual Activity: Not Asked   Other Topics Concern  . None   Social History Narrative   Retired [employer]   Married   Non smoker some etoh   Travels a lot             Outpatient Encounter Prescriptions as of 02/09/2016  Medication Sig  . aspirin 81 MG tablet Take 81 mg by mouth daily.    Marland Kitchen atorvastatin (LIPITOR) 40 MG tablet Take 1 tablet (40 mg total) by mouth every other day.  Marland Kitchen atorvastatin (LIPITOR) 40 MG tablet TAKE 1 TABLET BY MOUTH EVERY OTHER DAY  . azelastine (OPTIVAR) 0.05 % ophthalmic solution Place one drop into the affected eye twice daily.  . clobetasol cream (TEMOVATE) [id number] % Apply 1 application topically 2 (two) times daily.  Marland Kitchen losartan (COZAAR) 50 MG tablet Take 1 tablet (50 mg total) by mouth daily.  . metoprolol (LOPRESSOR) 50 MG tablet TAKE 1/2 TABLET BY MOUTH TWICE A DAY  . metoprolol (LOPRESSOR) 50 MG tablet Take 0.5 tablets (25 mg total) by mouth 2 (two) times daily.  . MULTIPLE VITAMIN PO Take 1 tablet by mouth daily.   . nitroGLYCERIN (NITROSTAT) 0.4 MG SL tablet Place 1 tablet (0.4 mg total) under the tongue every 5 (five) minutes as needed for chest pain.  . RABEprazole (ACIPHEX) 20 MG  tablet TAKE 1 TABLET BY MOUTH ONCE DAILY  . tamsulosin (FLOMAX) 0.4 MG CAPS capsule Take 1 capsule (0.4 mg total) by mouth daily.  . [DISCONTINUED] metoprolol (LOPRESSOR) 50 MG tablet Take 0.5 tablets (25 mg total) by mouth 2 (two) times daily.  . [DISCONTINUED] tamsulosin (FLOMAX) 0.4 MG CAPS capsule TAKE ONE CAPSULE BY MOUTH EVERY DAY  . [DISCONTINUED] azelastine (ASTELIN) 0.1 % nasal spray Place into both nostrils 2 (two) times daily. Use  in each nostril as directed  . [DISCONTINUED] fluticasone (FLONASE) 50 MCG/ACT nasal spray 1-2 SPRAYS IN EACH NOSTRIL DAILY  . [DISCONTINUED] NON FORMULARY Joint capsule 1 tab po qd  . [DISCONTINUED] Omega-3 Fatty Acids (OMEGA 3 PO) Take 1 capsule by mouth daily.   No facility-administered encounter medications on file as of 02/09/2016.    EXAM:  BP 132/90 mmHg  Temp(Src) 98.7 F (37.1 C) (Oral)  Ht 5' 7.5" (1.715 m)  Wt 200 lb (90.719 kg)  BMI 30.84 kg/m2  Body mass index is 30.84 kg/(m^2).  Physical Exam: Vital signs reviewed [id number] is a well-developed well-nourished alert cooperative   who appears stated age in no acute distress.  HEENT: normocephalic atraumatic , Eyes: PERRL EOM's full, conjunctiva clear, Nares: paten,t no deformity discharge or tenderness., Ears: no deformity EAC's clear TMs with normal landmarks.1+ wax in ear  Mouth: clear OP, no lesions, edema.  Moist mucous membranes. Dentition in adequate repair. NECK: supple without masses, thyromegaly or bruits. CHEST/PULM:  Clear to auscultation and percussion breath sounds equal no wheeze , rales or rhonchi. No chest wall deformities or tenderness. CV: PMI is nondisplaced, S1 S2 no gallops, murmurs, rubs. Peripheral pulses are full without delay.No JVD .  Well healed mid scar ABDOMEN: Bowel sounds normal nontender  No guard or rebound, no hepato splenomegal no CVA tenderness.   Extremtities:  No clubbing cyanosis or edema, no acute joint swelling or redness no focal atrophy czlous  5th meta head  No lesion redness  NEURO:  Oriented x3, cranial nerves 3-12 appear to be intact, no obvious focal weakness,gait within normal limits no abnormal reflexes or asymmetrical SKIN: No acute rashes normal turgor, color,  or petechiae. Senile ecchymosis dorsal hand  PSYCH: Oriented, good eye contact, no obvious depression anxiety, cognition and judgment appear normal. LN: no cervical axillary inguinal adenopathy No noted deficits in memory, attention, and speech.  ASSESSMENT AND PLAN:  Discussed the following assessment and plan:  Medicare annual wellness visit, subsequent  HLD (hyperlipidemia) - Plan: Basic metabolic panel, CBC with Differential/Platelet, Hepatic function panel, Hemoglobin A1c, Lipid panel, TSH  S/P CABG (coronary artery bypass graft) - Plan: Basic metabolic panel, CBC with Differential/Platelet, Hepatic function panel, Hemoglobin A1c, Lipid panel, TSH  Hyperglycemia - Plan: Basic metabolic panel, CBC with Differential/Platelet, Hepatic function panel, Hemoglobin A1c, Lipid panel, TSH  Nocturia - Plan: Basic metabolic panel, CBC with Differential/Platelet, Hepatic function panel, Hemoglobin A1c, Lipid panel, TSH, Ambulatory referral to Urology  Essential hypertension - Plan: Basic metabolic panel, CBC with Differential/Platelet, Hepatic function panel, Hemoglobin A1c, Lipid panel, TSH  Atherosclerosis of aorta (HCC) - Plan: Basic metabolic panel, CBC with Differential/Platelet, Hepatic function panel, Hemoglobin A1c, Lipid panel, TSH  Need for 23-polyvalent pneumococcal polysaccharide vaccine - Plan: Pneumococcal polysaccharide vaccine 23-valent greater than or equal to 2yo subcutaneous/IM  Senile ecchymosis - hands dorsum, probably  goes away with tme  pt on asa no other lesions brusing  Medication management Disc plan foot care  Fu  Lab review   Hearing  Mild dec high frequency  Patient Care Team: [name], MD as PCP - General Josue Hector, MD  (Cardiology) Rexene Alberts, MD (Thoracic Diseases) Milus Banister, MD as Attending Physician (Gastroenterology)  Patient Instructions   Refer  urology about opinion other interventions for   Nocturia  .  But avoid  Tea  4 hours before bed.  Pneumovax    Today  Will notify you  of labs when available. ROV  yearly depending on results of tests and how ou are doing  Get your eye check    Health Maintenance, Male A healthy lifestyle and preventative care can promote health and wellness.  Maintain regular health, dental, and eye exams.  Eat a healthy diet. Foods like vegetables, fruits, whole grains, low-fat dairy products, and lean protein foods contain the nutrients you need and are low in calories. Decrease your intake of foods high in solid fats, added sugars, and salt. Get information about a proper diet from your health care provider, if necessary.  Regular physical exercise is one of the most important things you can do for your health. Most adults should get at least 150 minutes of moderate-intensity exercise (any activity that increases your heart rate and causes you to sweat) each week. In addition, most adults need muscle-strengthening exercises on 2 or more days a week.   Maintain a healthy weight. The body mass index (BMI) is a screening tool to identify possible weight problems. It provides an estimate of body fat based on height and weight. Your health care provider can find your BMI and can help you achieve or maintain a healthy weight. For males 20 years and older:  A BMI below 18.5 is considered underweight.  A BMI of 18.5 to 24.9 is normal.  A BMI of 25 to 29.9 is considered overweight.  A BMI of 30 and above is considered obese.  Maintain normal blood lipids and cholesterol by exercising and minimizing your intake of saturated fat. Eat a balanced diet with plenty of fruits and vegetables. Blood tests for lipids and cholesterol should begin at age 77 and be repeated  every 5 years. If your lipid or cholesterol levels are high, you are over age 68, or you are at high risk for heart disease, you may need your cholesterol levels checked more frequently.Ongoing high lipid and cholesterol levels should be treated with medicines if diet and exercise are not working.  If you smoke, find out from your health care provider how to quit. If you do not use tobacco, do not start.  Lung cancer screening is recommended for adults aged 76-80 years who are at high risk for developing lung cancer because of a history of smoking. A yearly low-dose CT scan of the lungs is recommended for people who have at least a 30-pack-year history of smoking and are current smokers or have quit within the past 15 years. A pack year of smoking is smoking an average of 1 pack of cigarettes a day for 1 year (for example, a 30-pack-year history of smoking could mean smoking 1 pack a day for 30 years or 2 packs a day for 15 years). Yearly screening should continue until the smoker has stopped smoking for at least 15 years. Yearly screening should be stopped for people who develop a health problem that would prevent them from having lung cancer treatment.  If you choose to drink alcohol, do not have more than 2 drinks per day. One drink is considered to be 12 oz (360 mL) of beer, 5 oz (150 mL) of wine, or 1.5 oz (45 mL) of liquor.  Avoid the use of street drugs. Do not share needles with anyone. Ask for help if you need support or instructions about stopping the use of drugs.  High blood pressure causes heart disease and increases the risk of stroke. High blood pressure is more likely to develop in:  People who have blood pressure in the end of the  normal range (100-139/85-89 mm Hg).  People who are overweight or obese.  People who are African American.  If you are 44-62 years of age, have your blood pressure checked every 3-5 years. If you are 21 years of age or older, have your blood pressure  checked every year. You should have your blood pressure measured twice--once when you are at a hospital or clinic, and once when you are not at a hospital or clinic. Record the average of the two measurements. To check your blood pressure when you are not at a hospital or clinic, you can use:  An automated blood pressure machine at a pharmacy.  A home blood pressure monitor.  If you are 66-44 years old, ask your health care provider if you should take aspirin to prevent heart disease.  Diabetes screening involves taking a blood sample to check your fasting blood sugar level. This should be done once every 3 years after age 53 if you are at a normal weight and without risk factors for diabetes. Testing should be considered at a younger age or be carried out more frequently if you are overweight and have at least 1 risk factor for diabetes.  Colorectal cancer can be detected and often prevented. Most routine colorectal cancer screening begins at the age of 47 and continues through age 49. However, your health care provider may recommend screening at an earlier age if you have risk factors for colon cancer. On a yearly basis, your health care provider may provide home test kits to check for hidden blood in the stool. A small camera at the end of a tube may be used to directly examine the colon (sigmoidoscopy or colonoscopy) to detect the earliest forms of colorectal cancer. Talk to your health care provider about this at age 74 when routine screening begins. A direct exam of the colon should be repeated every 5-10 years through age 48, unless early forms of precancerous polyps or small growths are found.  People who are at an increased risk for hepatitis B should be screened for this virus. You are considered at high risk for hepatitis B if:  You were born in a country where hepatitis B occurs often. Talk with your health care provider about which countries are considered high risk.  Your parents were  born in a high-risk country and you have not received a shot to protect against hepatitis B (hepatitis B vaccine).  You have HIV or AIDS.  You use needles to inject street drugs.  You live with, or have sex with, someone who has hepatitis B.  You are a man who has sex with other men (MSM).  You get hemodialysis treatment.  You take certain medicines for conditions like cancer, organ transplantation, and autoimmune conditions.  Hepatitis C blood testing is recommended for all people born from [birth year] through 1967 and any individual with known risk factors for hepatitis C.  Healthy men should no longer receive prostate-specific antigen (PSA) blood tests as part of routine cancer screening. Talk to your health care provider about prostate cancer screening.  Testicular cancer screening is not recommended for adolescents or adult males who have no symptoms. Screening includes self-exam, a health care provider exam, and other screening tests. Consult with your health care provider about any symptoms you have or any concerns you have about testicular cancer.  Practice safe sex. Use condoms and avoid high-risk sexual practices to reduce the spread of sexually transmitted infections (STIs).  You should be screened for  STIs, including gonorrhea and chlamydia if:  You are sexually active and are younger than 24 years.  You are older than 24 years, and your health care provider tells you that you are at risk for this type of infection.  Your sexual activity has changed since you were last screened, and you are at an increased risk for chlamydia or gonorrhea. Ask your health care provider if you are at risk.  If you are at risk of being infected with HIV, it is recommended that you take a prescription medicine daily to prevent HIV infection. This is called pre-exposure prophylaxis (PrEP). You are considered at risk if:  You are a man who has sex with other men (MSM).  You are a heterosexual man who  is sexually active with multiple partners.  You take drugs by injection.  You are sexually active with a partner who has HIV.  Talk with your health care provider about whether you are at high risk of being infected with HIV. If you choose to begin PrEP, you should first be tested for HIV. You should then be tested every 3 months for as long as you are taking PrEP.  Use sunscreen. Apply sunscreen liberally and repeatedly throughout the day. You should seek shade when your shadow is shorter than you. Protect yourself by wearing long sleeves, pants, a wide-brimmed hat, and sunglasses year round whenever you are outdoors.  Tell your health care provider of new moles or changes in moles, especially if there is a change in shape or color. Also, tell your health care provider if a mole is larger than the size of a pencil eraser.  A one-time screening for abdominal aortic aneurysm (AAA) and surgical repair of large AAAs by ultrasound is recommended for men aged 25-75 years who are current or former smokers.  Stay current with your vaccines (immunizations).   This information is not intended to replace advice given to you by your health care provider. Make sure you discuss any questions you have with your health care provider.   Document Released: 06/08/2008 Document Revised: 01/01/2015 Document Reviewed: 05/08/2011 Elsevier Interactive Patient Education 2016 Saline K. Garo [place]idelberg M.D.

## 2016-02-14 ENCOUNTER — Ambulatory Visit (INDEPENDENT_AMBULATORY_CARE_PROVIDER_SITE_OTHER): Payer: Medicare Other | Admitting: Cardiovascular Disease

## 2016-02-14 ENCOUNTER — Encounter: Payer: Self-pay | Admitting: Cardiovascular Disease

## 2016-02-14 VITALS — BP 140/80 | HR 77 | Ht 67.5 in | Wt 199.8 lb

## 2016-02-14 DIAGNOSIS — I2510 Atherosclerotic heart disease of native coronary artery without angina pectoris: Secondary | ICD-10-CM | POA: Diagnosis not present

## 2016-02-14 NOTE — Patient Instructions (Addendum)

## 2016-02-24 ENCOUNTER — Telehealth: Payer: Self-pay | Admitting: Internal Medicine

## 2016-02-24 NOTE — Telephone Encounter (Signed)
Patient asking for a copy of lab results.

## 2016-02-25 NOTE — Telephone Encounter (Signed)
Pt's wife notified that results will be mailed.

## 2016-03-09 DIAGNOSIS — L570 Actinic keratosis: Secondary | ICD-10-CM | POA: Diagnosis not present

## 2016-03-31 DIAGNOSIS — Z0000 Encounter for general adult medical examination without abnormal findings: Secondary | ICD-10-CM | POA: Diagnosis not present

## 2016-03-31 DIAGNOSIS — R3915 Urgency of urination: Secondary | ICD-10-CM | POA: Diagnosis not present

## 2016-03-31 DIAGNOSIS — N3281 Overactive bladder: Secondary | ICD-10-CM | POA: Diagnosis not present

## 2016-03-31 DIAGNOSIS — N401 Enlarged prostate with lower urinary tract symptoms: Secondary | ICD-10-CM | POA: Diagnosis not present

## 2016-04-22 ENCOUNTER — Other Ambulatory Visit: Payer: Self-pay | Admitting: Internal Medicine

## 2016-04-24 NOTE — Telephone Encounter (Signed)
Patient is back home and no longer traveling to [place].

## 2016-07-12 DIAGNOSIS — L409 Psoriasis, unspecified: Secondary | ICD-10-CM | POA: Diagnosis not present

## 2016-07-14 ENCOUNTER — Ambulatory Visit (INDEPENDENT_AMBULATORY_CARE_PROVIDER_SITE_OTHER): Payer: Medicare Other | Admitting: Internal Medicine

## 2016-07-14 ENCOUNTER — Encounter: Payer: Self-pay | Admitting: Internal Medicine

## 2016-07-14 VITALS — BP 118/62 | HR 73 | Temp 98.8°F | Ht 67.5 in | Wt 202.0 lb

## 2016-07-14 DIAGNOSIS — J329 Chronic sinusitis, unspecified: Secondary | ICD-10-CM | POA: Diagnosis not present

## 2016-07-14 DIAGNOSIS — M542 Cervicalgia: Secondary | ICD-10-CM

## 2016-07-14 DIAGNOSIS — I2510 Atherosclerotic heart disease of native coronary artery without angina pectoris: Secondary | ICD-10-CM | POA: Diagnosis not present

## 2016-07-14 DIAGNOSIS — H9202 Otalgia, left ear: Secondary | ICD-10-CM

## 2016-07-14 DIAGNOSIS — Z951 Presence of aortocoronary bypass graft: Secondary | ICD-10-CM

## 2016-07-14 DIAGNOSIS — J029 Acute pharyngitis, unspecified: Secondary | ICD-10-CM

## 2016-07-14 MED ORDER — AMOXICILLIN-POT CLAVULANATE 875-125 MG PO TABS: 1.0000 | ORAL_TABLET | Freq: Two times a day (BID) | ORAL | Status: DC

## 2016-07-14 NOTE — Patient Instructions (Addendum)
Treat for sinusitus / localized  Throat infection  Based on exam today .  Expect improviment in the next 48 - 72 hours   Gargles and saline nose spray ok  Warm compresses to the neck area also.

## 2016-07-14 NOTE — Progress Notes (Signed)
Pre visit review using our clinic review tool, if applicable. No additional management support is needed unless otherwise documented below in the visit note.  Chief Complaint  Patient presents with  . Cough  . Dizziness  . Neck Pain    HPI: [name] 75 y.o.  comesin with wife today for new problem  Had underlying sinus issues but now in past 2-3 days has had onset  Onsetleft ear and neck pain and throat pain without fever  Cough  minimla to none   increase in sinus drainage gut no teeth pain  Left neck and ? Glands hurt .  Traveling next week . Tender anterior neck left more than right  ROS: See pertinent positives and negatives per HPI. Now on mybetrec and better  Dec void at night  No cp sob assoc sx with above  jsut saw dr Johnsie Cancel fu  And   stable  Past Medical History  Diagnosis Date  . 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 VITAMIN D DEFICIENCY 11/02/2009  . HYPERLIPIDEMIA 07/25/2007  . HYPERTENSION 07/23/2007  . CAD 01/22/2009  . Atrial fibrillation ([place]) 07/06/2009  . 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  . RHINITIS, CHRONIC 05/11/2010  . ALLERGIC RHINITIS 07/23/2007  . GERD 07/25/2007  . HERNIA 07/06/2009  . 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 BENIGN PROSTATIC HYPERTROPHY 07/25/2007  . BACK PAIN, LUMBAR 10/25/2010  . LUMBAR RADICULOPATHY, RIGHT 10/18/2007  . BACK PAIN 11/02/2009  . MUSCLE PAIN 01/22/2009  . NEURITIS 10/25/2010  . DIZZINESS 01/22/2009  . Palpitations 05/09/2010  . DYSPNEA ON EXERTION 08/21/2008  . Cough 10/25/2010  . RUQ PAIN 01/22/2009  . HYPERGLYCEMIA, BORDERLINE 12/31/2007  . ECHOCARDIOGRAM, ABNORMAL 08/19/2007  . UNS ADVRS EFF OTH RX MEDICINAL&BIOLOGICAL SBSTNC 04/15/2008  . HX, PERSONAL, MALIGNANCY, SKIN NEC 10/18/2007  . COLONIC POLYPS, HX OF 10/18/2007  . Sinus polyp   . Voice strain   . Arthritis   . Cancer ([place])     skin  . Nausea   . Inguinal hernia   . Abdominal pain, right lateral 02/21/2012    progressive.      Family History  Problem Relation Age  of Onset  . Lung cancer Mother   . Cancer Mother   . Heart attack Father   . Hypertension Father   . Alcohol abuse Father   . Heart disease Father   . Breast cancer Sister   . Cancer Sister     breast  . Alcohol abuse Brother   . Hypertension Brother   . Alcohol abuse Brother   . Hypertension Brother   . Alcohol abuse Brother   . Hypertension Brother   . Kidney disease Sister     Social History   Social History  . Marital Status: Married    Spouse Name: N/A  . Number of Children: N/A  . Years of Education: N/A   Social History Main Topics  . Smoking status: Former Smoker    Types: Cigarettes    Quit date: 04/02/1964  . Smokeless tobacco: Never Used     Comment: in early 20's he quit  . Alcohol Use: No     Comment: socially  . Drug Use: No  . Sexual Activity: Not Asked   Other Topics Concern  . None   Social History Narrative   Retired [employer]   Married   Non smoker some etoh   Travels a lot             Outpatient Prescriptions Prior to  Visit  Medication Sig Dispense Refill  . aspirin 81 MG tablet Take 81 mg by mouth daily.      Marland Kitchen atorvastatin (LIPITOR) 40 MG tablet Take 1 tablet (40 mg total) by mouth every other day. 45 tablet 1  . losartan (COZAAR) 50 MG tablet Take 1 tablet (50 mg total) by mouth daily. 90 tablet 3  . metoprolol (LOPRESSOR) 50 MG tablet Take 0.5 tablets (25 mg total) by mouth 2 (two) times daily. 90 tablet 3  . MULTIPLE VITAMIN PO Take 1 tablet by mouth daily.     . nitroGLYCERIN (NITROSTAT) 0.4 MG SL tablet Place 1 tablet (0.4 mg total) under the tongue every 5 (five) minutes as needed for chest pain. 25 tablet 4  . RABEprazole (ACIPHEX) 20 MG tablet TAKE 1 TABLET BY MOUTH ONCE DAILY 90 tablet 1  . tamsulosin (FLOMAX) 0.4 MG CAPS capsule Take 1 capsule (0.4 mg total) by mouth daily. 90 capsule 3  . azelastine (OPTIVAR) 0.05 % ophthalmic solution Place 1 drop into both eyes daily.     . clobetasol cream (TEMOVATE) [id number] % Apply 1  application topically 2 (two) times daily.     No facility-administered medications prior to visit.     EXAM:  BP 118/62 mmHg  Pulse 73  Temp(Src) 98.8 F (37.1 C) (Oral)  Ht 5' 7.5" (1.715 m)  Wt 202 lb (91.627 kg)  BMI 31.15 kg/m2  SpO2 95%  Body mass index is 31.15 kg/(m^2).  GENERAL: vitals reviewed and listed above, alert, oriented, appears well hydrated and in no acute distress mildly fatigue looking  HEENT: atraumatic, conjunctiva  clear, no obvious abnormalities on inspection of external nose and ears tms NAD  Neg pain on eternal manipulation   Nares  mucoid dc face non tender  OP : no lesion edema or exudate  NECK:  Very tender left ac scm and some right  tender jdg?    LUNGS: clear to auscultation bilaterally, no wheezes, rales or rhonchi,  CV: HRRR, no clubbing cyanosis or   nl cap refill  MS: moves all extremities without noticeable focal  abnormality PSYCH: pleasant and cooperative, no obvious depression or anxiety Lab Results  Component Value Date   WBC 6.9 02/09/2016   HGB 15.8 02/09/2016   HCT 46.6 02/09/2016   PLT 239.0 02/09/2016   GLUCOSE 110* 02/09/2016   CHOL 159 02/09/2016   TRIG 69.0 02/09/2016   HDL 41.30 02/09/2016   LDLDIRECT 187.2 03/31/2008   LDLCALC 103* 02/09/2016   ALT 26 02/09/2016   AST 16 02/09/2016   NA 142 02/09/2016   K 4.8 02/09/2016   CL 107 02/09/2016   CREATININE 0.79 02/09/2016   BUN 15 02/09/2016   CO2 28 02/09/2016   TSH 1.65 02/09/2016   PSA 1.88 09/03/2012   INR 1.1* 01/25/2012   HGBA1C 5.7 02/09/2016    ASSESSMENT AND PLAN:  Discussed the following assessment and plan:  Referred otalgia of left ear  Neck pain on left side  Pharyngeal inflammation - left  poss from pnd sinusitis   Chronic sinusitis, unspecified location  S/P CABG (coronary artery bypass graft) Suspect acute on chronic sinusitis  rx as planned and   Expectant management. Alarm sx need for fu  -Patient advised to return or notify health care  team  if symptoms worsen ,persist or new concerns arise.  Patient Instructions  Treat for sinusitus / localized  Throat infection  Based on exam today .  Expect improviment in the next 48 -  72 hours   Gargles and saline nose spray ok  Warm compresses to the neck area also.   Standley Brooking. Kennith Morss M.D.

## 2016-07-19 ENCOUNTER — Other Ambulatory Visit: Payer: Self-pay | Admitting: Internal Medicine

## 2016-07-21 NOTE — Telephone Encounter (Signed)
Sent to the pharmacy by e-scribe. 

## 2016-08-09 ENCOUNTER — Telehealth: Payer: Self-pay | Admitting: Internal Medicine

## 2016-08-09 NOTE — Telephone Encounter (Signed)
Denied.  Refill request is from [place].  Message sent to the pharmacy for pt to call for refill if in the state.

## 2016-08-22 NOTE — Telephone Encounter (Addendum)
Wife states she called [facility] in [place] and they advised pt there are no refills. Pt would like you to call [facility] and have this corrected.  Pt needs metoprolol (LOPRESSOR) 50 MG tablet tamsulosin (FLOMAX) 0.4 MG CAPS capsule  Pt has only 2 days left.

## 2016-08-23 MED ORDER — METOPROLOL TARTRATE 50 MG PO TABS: 25.0000 mg | ORAL_TABLET | Freq: Two times a day (BID) | ORAL | 0 refills | Status: DC

## 2016-08-23 MED ORDER — TAMSULOSIN HCL 0.4 MG PO CAPS: 0.4000 mg | ORAL_CAPSULE | Freq: Every day | ORAL | 0 refills | Status: DC

## 2016-08-23 NOTE — Addendum Note (Signed)
Addended by: Miles Costain T on: 08/23/2016 10:01 AM   Modules accepted: Orders

## 2016-08-23 NOTE — Telephone Encounter (Signed)
Sent to the pharmacy for 90 days. 

## 2016-09-27 ENCOUNTER — Other Ambulatory Visit: Payer: Self-pay | Admitting: Cardiovascular Disease

## 2016-10-06 ENCOUNTER — Other Ambulatory Visit: Payer: Self-pay | Admitting: *Deleted

## 2016-10-06 MED ORDER — LOSARTAN POTASSIUM 50 MG PO TABS: 50.0000 mg | ORAL_TABLET | Freq: Every day | ORAL | 0 refills | Status: DC

## 2016-10-09 ENCOUNTER — Ambulatory Visit (INDEPENDENT_AMBULATORY_CARE_PROVIDER_SITE_OTHER): Payer: Medicare Other | Admitting: Family Medicine

## 2016-10-09 DIAGNOSIS — Z23 Encounter for immunization: Secondary | ICD-10-CM

## 2016-11-21 ENCOUNTER — Telehealth: Payer: Self-pay | Admitting: Internal Medicine

## 2016-11-21 MED ORDER — TAMSULOSIN HCL 0.4 MG PO CAPS: 0.4000 mg | ORAL_CAPSULE | Freq: Every day | ORAL | 0 refills | Status: DC

## 2016-11-21 MED ORDER — METOPROLOL TARTRATE 50 MG PO TABS: 25.0000 mg | ORAL_TABLET | Freq: Two times a day (BID) | ORAL | 0 refills | Status: DC

## 2016-11-21 NOTE — Telephone Encounter (Signed)
Pt had these last filled in [place], now the pharmacy will not fill here. Can you send refills of  tamsulosin (FLOMAX) 0.4 MG CAPS capsule metoprolol (LOPRESSOR) 50 MG tablet   [facility] [address]

## 2016-11-21 NOTE — Telephone Encounter (Signed)
Sent to the pharmacy by e-scribe for 90 days.  Pt has upcoming cpx on 02/13/17.

## 2016-12-05 ENCOUNTER — Ambulatory Visit (INDEPENDENT_AMBULATORY_CARE_PROVIDER_SITE_OTHER): Payer: Medicare Other | Admitting: Adult Health

## 2016-12-05 ENCOUNTER — Encounter: Payer: Self-pay | Admitting: Adult Health

## 2016-12-05 VITALS — BP 124/76 | Temp 98.1°F | Ht 67.5 in | Wt 209.9 lb

## 2016-12-05 DIAGNOSIS — H02846 Edema of left eye, unspecified eyelid: Secondary | ICD-10-CM

## 2016-12-05 DIAGNOSIS — I2510 Atherosclerotic heart disease of native coronary artery without angina pectoris: Secondary | ICD-10-CM

## 2016-12-05 MED ORDER — METHYLPREDNISOLONE 4 MG PO TBPK: ORAL_TABLET | ORAL | 0 refills | Status: DC

## 2016-12-05 NOTE — Progress Notes (Signed)
Subjective:    Patient ID: [name], male    DOB: 1941-02-22, 75 y.o.   MRN: [id number]  HPI  75 year old male who presents to the office with the acute complaint of left eye swelling. He reports that the swelling started about 24 hours ago and has become worse since waking up this morning. He reports pain around the orbit as well as occasional instances of blurred vision.   He denies any fevers, trauma to the area, discharge from the eyes or starting any new medications.   Review of Systems  Constitutional: Negative.   HENT: Positive for facial swelling.   Eyes: Positive for pain and visual disturbance. Negative for photophobia, discharge, redness and itching.  Respiratory: Negative.   Cardiovascular: Negative.   Neurological: Negative.   All other systems reviewed and are negative.  Past Medical History:  Diagnosis Date  . Abdominal pain, right lateral 02/21/2012   progressive.    . ALLERGIC RHINITIS 07/23/2007  . Arthritis   . Atrial fibrillation ([place]) 07/06/2009  . BACK PAIN 11/02/2009  . BACK PAIN, LUMBAR 10/25/2010  . BENIGN PROSTATIC HYPERTROPHY 07/25/2007  . 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 CAD 01/22/2009  . Cancer ([place])    skin  . 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  . COLONIC POLYPS, HX OF 10/18/2007  . Cough 10/25/2010  . DIZZINESS 01/22/2009  . DYSPNEA ON EXERTION 08/21/2008  . ECHOCARDIOGRAM, ABNORMAL 08/19/2007  . GERD 07/25/2007  . HERNIA 07/06/2009  . HX, PERSONAL, MALIGNANCY, SKIN NEC 10/18/2007  . HYPERGLYCEMIA, BORDERLINE 12/31/2007  . HYPERLIPIDEMIA 07/25/2007  . HYPERTENSION 07/23/2007  . Inguinal hernia   . LUMBAR RADICULOPATHY, RIGHT 10/18/2007  . MUSCLE PAIN 01/22/2009  . Nausea   . NEURITIS 10/25/2010  . Palpitations 05/09/2010  . RHINITIS, CHRONIC 05/11/2010  . RUQ PAIN 01/22/2009  . Sinus polyp   . UNS ADVRS EFF OTH RX MEDICINAL&BIOLOGICAL SBSTNC 04/15/2008  . 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 VITAMIN D DEFICIENCY 11/02/2009  . Voice strain      Social History   Social History  . Marital status: Married    Spouse name: N/A  . Number of children: N/A  . Years of education: N/A   Occupational History  . Not on file.   Social History Main Topics  . Smoking status: Former Smoker    Types: Cigarettes    Quit date: 04/02/1964  . Smokeless tobacco: Never Used     Comment: in early 20's he quit  . Alcohol use No     Comment: socially  . Drug use: No  . Sexual activity: Not on file   Other Topics Concern  . Not on file   Social History Narrative   Retired [employer]   Married   Non smoker some etoh   Travels a lot             Past Surgical History:  Procedure Laterality Date  . BACK SURGERY    . basal cell and melanoma removed     face  . BONY PELVIS SURGERY    . CATARACT EXTRACTION     BIL  . COLONOSCOPY    . CORONARY ARTERY BYPASS GRAFT  2009  . HERNIA REPAIR  7 times   inguinal  . LAPAROSCOPIC CHOLECYSTECTOMY  03/03/2013  . TONSILLECTOMY      Family History  Problem Relation Age of Onset  . Lung cancer Mother   . Cancer Mother   . Heart attack Father   .  Hypertension Father   . Alcohol abuse Father   . Heart disease Father   . Breast cancer Sister   . Cancer Sister     breast  . Alcohol abuse Brother   . Hypertension Brother   . Alcohol abuse Brother   . Hypertension Brother   . Alcohol abuse Brother   . Hypertension Brother   . Kidney disease Sister     Allergies  Allergen Reactions  . Propoxyphene N-Acetaminophen Nausea And Vomiting  . Iodinated Diagnostic Agents Swelling    Pt developed slight lt upper lip swelling only to one side about 15 minutes post IV contrast injection. No other symptoms.     Current Outpatient Prescriptions on File Prior to Visit  Medication Sig Dispense Refill  . amoxicillin-clavulanate (AUGMENTIN) 875-125 MG tablet Take 1 tablet by mouth every 12 (twelve) hours. 14 tablet 0  . aspirin 81 MG tablet Take 81 mg by mouth daily.      Marland Kitchen atorvastatin  (LIPITOR) 40 MG tablet Take 1 tablet (40 mg total) by mouth every other day. 45 tablet 1  . clobetasol (TEMOVATE) 0.05 % external solution APPLY TO AFFECTED AREA ONCE TO TWICE DAILY  10  . losartan (COZAAR) 50 MG tablet Take 1 tablet (50 mg total) by mouth daily. 90 tablet 0  . metoprolol (LOPRESSOR) 50 MG tablet Take 0.5 tablets (25 mg total) by mouth 2 (two) times daily. 90 tablet 0  . MULTIPLE VITAMIN PO Take 1 tablet by mouth daily.     Marland Kitchen MYRBETRIQ 25 MG TB24 tablet Take 25 mg by mouth daily.  0  . NITROSTAT 0.4 MG SL tablet PLACE 1 TABLET UNDER TONGUE EVERY 5 MINUTES AS NEEDED FOR CHEST PAIN 25 tablet 1  . RABEprazole (ACIPHEX) 20 MG tablet TAKE 1 TABLET BY MOUTH ONCE DAILY 90 tablet 1  . tamsulosin (FLOMAX) 0.4 MG CAPS capsule Take 1 capsule (0.4 mg total) by mouth daily. 90 capsule 0   No current facility-administered medications on file prior to visit.     BP 124/76   Temp 98.1 F (36.7 C) (Oral)   Ht 5' 7.5" (1.715 m)   Wt 209 lb 14.4 oz (95.2 kg)   BMI 32.39 kg/m        Objective:   Physical Exam  Constitutional: He is oriented to person, place, and time. He appears well-developed and well-nourished. No distress.  Eyes: Conjunctivae, EOM and lids are normal. Pupils are equal, round, and reactive to light. Lids are everted and swept, no foreign bodies found.  Edema noted under left eye, fluid filled. No signs of cellulitis, trauma, or injury   Cardiovascular: Normal rate, regular rhythm, normal heart sounds and intact distal pulses.  Exam reveals no gallop and no friction rub.   No murmur heard. Pulmonary/Chest: Effort normal and breath sounds normal. No respiratory distress. He has no wheezes. He has no rales. He exhibits no tenderness.  Neurological: He is alert and oriented to person, place, and time.  Skin: Skin is warm and dry. No rash noted. He is not diaphoretic. No erythema. No pallor.  Psychiatric: He has a normal mood and affect. His behavior is normal. Thought  content normal.  Nursing note and vitals reviewed.     Assessment & Plan:  1. Eyelid edema, left - possible allergic reaction.  - methylPREDNISolone (MEDROL DOSEPAK) 4 MG TBPK tablet; Take as directed  Dispense: 21 tablet; Refill: 0 - Cold compresses for 20 minutes at a time  - Follow up  with eye doctor or in the office if no resolution in the next 2-3 days   Dorothyann Peng, NP

## 2017-01-03 ENCOUNTER — Other Ambulatory Visit: Payer: Self-pay | Admitting: Cardiovascular Disease

## 2017-01-04 NOTE — Telephone Encounter (Signed)
Rx refill sent to pharmacy. 

## 2017-01-15 DIAGNOSIS — H04123 Dry eye syndrome of bilateral lacrimal glands: Secondary | ICD-10-CM | POA: Diagnosis not present

## 2017-01-15 DIAGNOSIS — H524 Presbyopia: Secondary | ICD-10-CM | POA: Diagnosis not present

## 2017-02-13 ENCOUNTER — Encounter: Payer: Medicare Other | Admitting: Internal Medicine

## 2017-02-13 ENCOUNTER — Other Ambulatory Visit: Payer: Medicare Other

## 2017-02-19 NOTE — Progress Notes (Signed)
Chief Complaint  Patient presents with  . Annual Exam    pt states that he has been having upper back pain    HPI:  [name] 76 y.o. comes in today for Preventive Medicare wellness visit .Since last visit.No major changes in health but has been having recurrent right low back flank pain with muscle spasm that wife has to massage and is limiting at times.  No fall or injury. Pain is worse with sitting long periods of time.  Also having problems on the bottom of his feet using Dr. Felicie Morn for calluses but still has problems walking at times. Wife wonders if seen a podiatrist would be helpful.  History of coronary artery disease follows cardiology yearly. No acute changes shortness of breath or persistent chest pain.  Hearing about the same question concerns of memory in the family but hearing and attention may be an issue. He able to drive doesn't get lost is good at directions wife does the finances.  GERD medication gets breakthrough symptoms at times.  Urinary continues on medicine still has nocturia but no dramatic changes in urinary stream.  No change in bowel habits blood in stool. Had a colonoscopy about 10 years ago at that time reports it being clear but anesthesia was incomplete so is avoiding any follow-up. No family history of colon cancer.  Gets regular eye check not sure the name.  Travels frequently for multiple family functions.  Lipid meds    Health Maintenance  Topic Date Due  . TETANUS/TDAP  08/17/2025  . INFLUENZA VACCINE  Completed  . PNA vac Low Risk Adult  Completed   Health Maintenance Review LIFESTYLE:   Sugar beverages:Limited Sleep:Interrupted nocturia but okay  MEDICARE DOCUMENT QUESTIONS  TO SCAN   Hearing:   [place]  Hard to hear with background noise   Vision:  No limitations at present . Last eye check UTD  Safety:  Has smoke detector and wears seat belts.  No firearms. No excess sun exposure. Sees dentist regularly.  Falls:   no  Advance directive :  Reviewed  Has one.  Memory: Felt to be ok sometimes has to repeat  ? uif family concern.  Depression: No anhedonia unusual crying or depressive symptoms  Nutrition: Eats well balanced diet; adequate calcium and vitamin D. No swallowing chewing problems.  Injury: no major injuries in the last six months.  Other healthcare providers:  Reviewed today .  Social:  Lives with spouse married. No pets. Travels   Preventive parameters: up-to-date  Reviewed   ADLS:   There are no problems or need for assistance  driving, feeding, obtaining food, dressing, toileting and bathing, managing money using phone.  is independent.  ROS: see hpi  Vision coudier than when younger  GEN/ HEENT: No fever, significant weight changes sweats headaches vision problems hearing changes, CV/ PULM; No chest pain shortness of breath cough, syncope,edema  change in exercise tolerance. GI /GU: No adominal pain, vomiting, change in bowel habits. No blood in the stool. . SKIN/HEME: ,no acute skin rashes suspicious lesions or bleeding. No lymphadenopathy, nodules, masses.  NEURO/ PSYCH:  No neurologic signs such as weakness numbness. No depression anxiety. IMM/ Allergy: No unusual infections.  Allergy .   REST of 12 system review negative except as per HPI   Past Medical History:  Diagnosis Date  . Abdominal pain, right lateral 02/21/2012   progressive.    . ALLERGIC RHINITIS 07/23/2007  . Arthritis   . Atrial fibrillation ([place])  07/06/2009  . BACK PAIN 11/02/2009  . BACK PAIN, LUMBAR 10/25/2010  . BENIGN PROSTATIC HYPERTROPHY 07/25/2007  . 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 CAD 01/22/2009  . Cancer ([place])    skin  . 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  . COLONIC POLYPS, HX OF 10/18/2007  . Cough 10/25/2010  . DIZZINESS 01/22/2009  . DYSPNEA ON EXERTION 08/21/2008  . ECHOCARDIOGRAM, ABNORMAL 08/19/2007  . GERD 07/25/2007  . HERNIA 07/06/2009  . HX, PERSONAL, MALIGNANCY, SKIN NEC 10/18/2007   . HYPERGLYCEMIA, BORDERLINE 12/31/2007  . HYPERLIPIDEMIA 07/25/2007  . HYPERTENSION 07/23/2007  . Inguinal hernia   . LUMBAR RADICULOPATHY, RIGHT 10/18/2007  . MUSCLE PAIN 01/22/2009  . Nausea   . NEURITIS 10/25/2010  . Palpitations 05/09/2010  . RHINITIS, CHRONIC 05/11/2010  . RUQ PAIN 01/22/2009  . Sinus polyp   . UNS ADVRS EFF OTH RX MEDICINAL&BIOLOGICAL SBSTNC 04/15/2008  . 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 VITAMIN D DEFICIENCY 11/02/2009  . Voice strain     Family History  Problem Relation Age of Onset  . Lung cancer Mother   . Cancer Mother   . Heart attack Father   . Hypertension Father   . Alcohol abuse Father   . Heart disease Father   . Breast cancer Sister   . Cancer Sister     breast  . Alcohol abuse Brother   . Hypertension Brother   . Alcohol abuse Brother   . Hypertension Brother   . Alcohol abuse Brother   . Hypertension Brother   . Kidney disease Sister     Social History   Social History  . Marital status: Married    Spouse name: N/A  . Number of children: N/A  . Years of education: N/A   Social History Main Topics  . Smoking status: Former Smoker    Types: Cigarettes    Quit date: 04/02/1964  . Smokeless tobacco: Never Used     Comment: in early 20's he quit  . Alcohol use No     Comment: socially  . Drug use: No  . Sexual activity: Not Asked   Other Topics Concern  . None   Social History Narrative   Retired [employer]   Married   Non smoker some etoh   Travels a lot             Outpatient Encounter Prescriptions as of 02/20/2017  Medication Sig  . aspirin 81 MG tablet Take 81 mg by mouth daily.    Marland Kitchen atorvastatin (LIPITOR) 40 MG tablet Take 1 tablet (40 mg total) by mouth every other day.  . clobetasol (TEMOVATE) 0.05 % external solution APPLY TO AFFECTED AREA ONCE TO TWICE DAILY  . Coenzyme Q10 (COQ-10 PO) Take by mouth.  . losartan (COZAAR) 50 MG tablet TAKE 1 TABLET (50 MG TOTAL) BY MOUTH DAILY.  . MULTIPLE VITAMIN PO Take 1 tablet  by mouth daily.   Marland Kitchen MYRBETRIQ 25 MG TB24 tablet Take 25 mg by mouth daily.  Marland Kitchen NITROSTAT 0.4 MG SL tablet PLACE 1 TABLET UNDER TONGUE EVERY 5 MINUTES AS NEEDED FOR CHEST PAIN  . RABEprazole (ACIPHEX) 20 MG tablet TAKE 1 TABLET BY MOUTH ONCE DAILY  . tamsulosin (FLOMAX) 0.4 MG CAPS capsule Take 1 capsule (0.4 mg total) by mouth daily.  . [DISCONTINUED] metoprolol (LOPRESSOR) 50 MG tablet Take 0.5 tablets (25 mg total) by mouth 2 (two) times daily.  . [DISCONTINUED] amoxicillin-clavulanate (AUGMENTIN) 875-125 MG tablet Take 1 tablet by mouth every 12 (twelve) hours.  . [  DISCONTINUED] methylPREDNISolone (MEDROL DOSEPAK) 4 MG TBPK tablet Take as directed   No facility-administered encounter medications on file as of 02/20/2017.     EXAM:  BP 118/64 (BP Location: Left Arm, Patient Position: Sitting, Cuff Size: Normal)   Pulse 66   Temp 97.9 F (36.6 C) (Oral)   Ht 5' 7.5" (1.715 m)   Wt 195 lb 12.8 oz (88.8 kg)   BMI 30.21 kg/m   Body mass index is 30.21 kg/m.  Physical Exam: Vital signs reviewed [id number] is a well-developed well-nourished alert cooperative   who appears stated age in no acute distress.  HEENT: normocephalic atraumatic , Eyes: PERRL EOM's full, conjunctiva clear, Nares: paten,t no deformity discharge or tenderness., Ears: no deformity EAC's clear TMs with normal landmarks. Mouth: clear OP, no lesions, edema.  Moist mucous membranes. Dentition in adequate repair. NECK: supple without masses, thyromegaly or bruits. CHEST/PULM:  Clear to auscultation and percussion breath sounds equal no wheeze , rales or rhonchi. No chest wall deformities or tenderness. CV: PMI is nondisplaced, S1 S2 no gallops, murmurs, rubs. Peripheral pulses are full without delay.No JVD .  ABDOMEN: Bowel sounds normal nontender  No guard or rebound, no hepato splenomegal no CVA tenderness.   Extremtities:  No clubbing cyanosis or edema, no acute joint swelling or redness no focal atrophyWalks slightly  curved foreword no midline bony tenderness right flank is area of pain. Bottom of feet show calluses over the fifth metatarsal no redness area pulses present and equal. NEURO:  Oriented x3, cranial nerves 3-12 appear to be intact, no obvious focal weakness,gait within normal limits no abnormal reflexes or asymmetrical SKIN: No acute rashes normal turgor, color, no bruising or petechiae. PSYCH: Oriented, good eye contact, no obvious depression anxiety, cognition and judgment appear normal. LN: no cervical axillary inguinal adenopathy declines  Rectal exam  No noted deficits in memory, attention, and speech.   Lab Results  Component Value Date   WBC 7.3 02/20/2017   HGB 15.4 02/20/2017   HCT 44.1 02/20/2017   PLT 208.0 02/20/2017   GLUCOSE 89 02/20/2017   CHOL 148 02/20/2017   TRIG 80.0 02/20/2017   HDL 37.00 (L) 02/20/2017   LDLDIRECT 187.2 03/31/2008   LDLCALC 95 02/20/2017   ALT 22 02/20/2017   AST 16 02/20/2017   NA 139 02/20/2017   K 4.2 02/20/2017   CL 105 02/20/2017   CREATININE 0.66 02/20/2017   BUN 18 02/20/2017   CO2 28 02/20/2017   TSH 1.65 02/09/2016   PSA 1.88 09/03/2012   INR 1.1 (H) 01/25/2012   HGBA1C 5.8 02/20/2017    ASSESSMENT AND PLAN:  Discussed the following assessment and plan:  Medicare annual wellness visit, subsequent  Essential hypertension - Plan: Basic metabolic panel, Hemoglobin A1c, Lipid panel, Hepatic function panel, CBC with Differential/Platelet  Hyperlipidemia, unspecified hyperlipidemia type - Plan: Basic metabolic panel, Hemoglobin A1c, Lipid panel, Hepatic function panel, CBC with Differential/Platelet  Hyperglycemia - Plan: Basic metabolic panel, Hemoglobin A1c, Lipid panel, Hepatic function panel, CBC with Differential/Platelet  S/P CABG (coronary artery bypass graft) - Plan: Basic metabolic panel, Hemoglobin A1c, Lipid panel, Hepatic function panel, CBC with Differential/Platelet  Medication management - Plan: Basic metabolic  panel, Hemoglobin A1c, Lipid panel, Hepatic function panel, CBC with Differential/Platelet  Chronic right-sided back pain, unspecified back location - Plan: Basic metabolic panel, Hemoglobin A1c, Lipid panel, Hepatic function panel, CBC with Differential/Platelet, Ambulatory referral to Physical Medicine Rehab, CANCELED: POCT Urinalysis, Dipstick  Foot pain, bilateral - with calluses -  Plan: Ambulatory referral to Podiatry, Ambulatory referral to Physical Medicine Rehab  Atherosclerosis of aorta [facility])  Colon cancer screening - disc optinos will not do colonoscopy unless abs necessaery  cologuard disc Patient Care Team: [name], MD as PCP - General Josue Hector, MD (Cardiology) Rexene Alberts, MD (Thoracic Diseases) Milus Banister, MD as Attending Physician (Gastroenterology)  Patient Instructions  Attention to healthy eating activity. Can do a referral in regard to your foot pain. Podiatry  probablypain is from back arthritis and may benefit from a either physical therapy or sports medicine or rehab referral for back pain.  Can do the cologuard test for colon cancer screening. Since  last colonoscopy was clear and negative family history.  Will notify you  of labs when available.   Fu depnding on labs or  Yearly      Health Maintenance, Male A healthy lifestyle and preventive care is important for your health and wellness. Ask your health care provider about what schedule of regular examinations is right for you. What should I know about weight and diet?  Eat a Healthy Diet  Eat plenty of vegetables, fruits, whole grains, low-fat dairy products, and lean protein.  Do not eat a lot of foods high in solid fats, added sugars, or salt. Maintain a Healthy Weight  Regular exercise can help you achieve or maintain a healthy weight. You should:  Do at least 150 minutes of exercise each week. The exercise should increase your heart rate and make you sweat (moderate-intensity  exercise).  Do strength-training exercises at least twice a week. Watch Your Levels of Cholesterol and Blood Lipids  Have your blood tested for lipids and cholesterol every 5 years starting at 78 years of age. If you are at high risk for heart disease, you should start having your blood tested when you are 77 years old. You may need to have your cholesterol levels checked more often if:  Your lipid or cholesterol levels are high.  You are older than 78 years of age.  You are at high risk for heart disease. What should I know about cancer screening? Many types of cancers can be detected early and may often be prevented. Lung Cancer  You should be screened every year for lung cancer if:  You are a current smoker who has smoked for at least 30 years.  You are a former smoker who has quit within the past 15 years.  Talk to your health care provider about your screening options, when you should start screening, and how often you should be screened. Colorectal Cancer  Routine colorectal cancer screening usually begins at 78 years of age and should be repeated every 5-10 years until you are 78 years old. You may need to be screened more often if early forms of precancerous polyps or small growths are found. Your health care provider may recommend screening at an earlier age if you have risk factors for colon cancer.  Your health care provider may recommend using home test kits to check for hidden blood in the stool.  A small camera at the end of a tube can be used to examine your colon (sigmoidoscopy or colonoscopy). This checks for the earliest forms of colorectal cancer. Prostate and Testicular Cancer  Depending on your age and overall health, your health care provider may do certain tests to screen for prostate and testicular cancer.  Talk to your health care provider about any symptoms or concerns you have about testicular  or prostate cancer. Skin Cancer  Check your skin from head  to toe regularly.  Tell your health care provider about any new moles or changes in moles, especially if:  There is a change in a mole's size, shape, or color.  You have a mole that is larger than a pencil eraser.  Always use sunscreen. Apply sunscreen liberally and repeat throughout the day.  Protect yourself by wearing long sleeves, pants, a wide-brimmed hat, and sunglasses when outside. What should I know about heart disease, diabetes, and high blood pressure?  If you are 49-56 years of age, have your blood pressure checked every 3-5 years. If you are 7 years of age or older, have your blood pressure checked every year. You should have your blood pressure measured twice-once when you are at a hospital or clinic, and once when you are not at a hospital or clinic. Record the average of the two measurements. To check your blood pressure when you are not at a hospital or clinic, you can use:  An automated blood pressure machine at a pharmacy.  A home blood pressure monitor.  Talk to your health care provider about your target blood pressure.  If you are between 6-42 years old, ask your health care provider if you should take aspirin to prevent heart disease.  Have regular diabetes screenings by checking your fasting blood sugar level.  If you are at a normal weight and have a low risk for diabetes, have this test once every three years after the age of 49.  If you are overweight and have a high risk for diabetes, consider being tested at a younger age or more often.  A one-time screening for abdominal aortic aneurysm (AAA) by ultrasound is recommended for men aged 82-75 years who are current or former smokers. What should I know about preventing infection? Hepatitis B  If you have a higher risk for hepatitis B, you should be screened for this virus. Talk with your health care provider to find out if you are at risk for hepatitis B infection. Hepatitis C  Blood testing is  recommended for:  Everyone born from [birth year] through 1967.  Anyone with known risk factors for hepatitis C. Sexually Transmitted Diseases (STDs)  You should be screened each year for STDs including gonorrhea and chlamydia if:  You are sexually active and are younger than 78 years of age.  You are older than 78 years of age and your health care provider tells you that you are at risk for this type of infection.  Your sexual activity has changed since you were last screened and you are at an increased risk for chlamydia or gonorrhea. Ask your health care provider if you are at risk.  Talk with your health care provider about whether you are at high risk of being infected with HIV. Your health care provider may recommend a prescription medicine to help prevent HIV infection. What else can I do?  Schedule regular health, dental, and eye exams.  Stay current with your vaccines (immunizations).  Do not use any tobacco products, such as cigarettes, chewing tobacco, and e-cigarettes. If you need help quitting, ask your health care provider.  Limit alcohol intake to no more than 2 drinks per day. One drink equals 12 ounces of beer, 5 ounces of wine, or 1 ounces of hard liquor.  Do not use street drugs.  Do not share needles.  Ask your health care provider for help if you need support or information  about quitting drugs.  Tell your health care provider if you often feel depressed.  Tell your health care provider if you have ever been abused or do not feel safe at home. This information is not intended to replace advice given to you by your health care provider. Make sure you discuss any questions you have with your health care provider. Document Released: 06/08/2008 Document Revised: 08/09/2016 Document Reviewed: 09/14/2015 Elsevier Interactive Patient Education  2017 [place]. Panosh M.D.

## 2017-02-20 ENCOUNTER — Other Ambulatory Visit: Payer: Self-pay | Admitting: Internal Medicine

## 2017-02-20 ENCOUNTER — Ambulatory Visit (INDEPENDENT_AMBULATORY_CARE_PROVIDER_SITE_OTHER): Payer: Medicare Other | Admitting: Internal Medicine

## 2017-02-20 ENCOUNTER — Encounter: Payer: Self-pay | Admitting: Internal Medicine

## 2017-02-20 VITALS — BP 118/64 | HR 66 | Temp 97.9°F | Ht 67.5 in | Wt 195.8 lb

## 2017-02-20 DIAGNOSIS — I10 Essential (primary) hypertension: Secondary | ICD-10-CM

## 2017-02-20 DIAGNOSIS — Z79899 Other long term (current) drug therapy: Secondary | ICD-10-CM | POA: Diagnosis not present

## 2017-02-20 DIAGNOSIS — I700 Atherosclerosis of aorta: Secondary | ICD-10-CM

## 2017-02-20 DIAGNOSIS — M79672 Pain in left foot: Secondary | ICD-10-CM | POA: Diagnosis not present

## 2017-02-20 DIAGNOSIS — Z0000 Encounter for general adult medical examination without abnormal findings: Secondary | ICD-10-CM | POA: Diagnosis not present

## 2017-02-20 DIAGNOSIS — M79671 Pain in right foot: Secondary | ICD-10-CM

## 2017-02-20 DIAGNOSIS — Z1211 Encounter for screening for malignant neoplasm of colon: Secondary | ICD-10-CM

## 2017-02-20 DIAGNOSIS — R739 Hyperglycemia, unspecified: Secondary | ICD-10-CM | POA: Diagnosis not present

## 2017-02-20 DIAGNOSIS — M549 Dorsalgia, unspecified: Secondary | ICD-10-CM | POA: Diagnosis not present

## 2017-02-20 DIAGNOSIS — Z951 Presence of aortocoronary bypass graft: Secondary | ICD-10-CM

## 2017-02-20 DIAGNOSIS — G8929 Other chronic pain: Secondary | ICD-10-CM

## 2017-02-20 DIAGNOSIS — E785 Hyperlipidemia, unspecified: Secondary | ICD-10-CM

## 2017-02-20 LAB — CBC WITH DIFFERENTIAL/PLATELET
BASOS ABS: 0.1 10*3/uL (ref 0.0–0.1)
BASOS PCT: 0.9 % (ref 0.0–3.0)
EOS ABS: 0.2 10*3/uL (ref 0.0–0.7)
Eosinophils Relative: 2.3 % (ref 0.0–5.0)
HEMATOCRIT: 44.1 % (ref 39.0–52.0)
Hemoglobin: 15.4 g/dL (ref 13.0–17.0)
LYMPHS PCT: 18.9 % (ref 12.0–46.0)
Lymphs Abs: 1.4 10*3/uL (ref 0.7–4.0)
MCHC: 34.8 g/dL (ref 30.0–36.0)
MCV: 94 fl (ref 78.0–100.0)
Monocytes Absolute: 0.9 10*3/uL (ref 0.1–1.0)
Monocytes Relative: 12.6 % — ABNORMAL HIGH (ref 3.0–12.0)
Neutro Abs: 4.8 10*3/uL (ref 1.4–7.7)
Neutrophils Relative %: 65.3 % (ref 43.0–77.0)
Platelets: 208 10*3/uL (ref 150.0–400.0)
RBC: 4.69 Mil/uL (ref 4.22–5.81)
RDW: 13.7 % (ref 11.5–15.5)
WBC: 7.3 10*3/uL (ref 4.0–10.5)

## 2017-02-20 LAB — LIPID PANEL
CHOLESTEROL: 148 mg/dL (ref 0–200)
HDL: 37 mg/dL — ABNORMAL LOW (ref >39.00)
LDL CALC: 95 mg/dL (ref 0–99)
NonHDL: 110.88
Total CHOL/HDL Ratio: 4
Triglycerides: 80 mg/dL (ref 0.0–149.0)
VLDL: 16 mg/dL (ref 0.0–40.0)

## 2017-02-20 LAB — HEPATIC FUNCTION PANEL
ALT: 22 U/L (ref 0–53)
AST: 16 U/L (ref 0–37)
Albumin: 4.3 g/dL (ref 3.5–5.2)
Alkaline Phosphatase: 51 U/L (ref 39–117)
BILIRUBIN TOTAL: 0.9 mg/dL (ref 0.2–1.2)
Bilirubin, Direct: 0.2 mg/dL (ref 0.0–0.3)
TOTAL PROTEIN: 6.3 g/dL (ref 6.0–8.3)

## 2017-02-20 LAB — BASIC METABOLIC PANEL
BUN: 18 mg/dL (ref 6–23)
CALCIUM: 9.6 mg/dL (ref 8.4–10.5)
CO2: 28 mEq/L (ref 19–32)
Chloride: 105 mEq/L (ref 96–112)
Creatinine, Ser: 0.66 mg/dL (ref 0.40–1.50)
GFR: 124.29 mL/min (ref >60.00)
Glucose, Bld: 89 mg/dL (ref 70–99)
POTASSIUM: 4.2 meq/L (ref 3.5–5.1)
SODIUM: 139 meq/L (ref 135–145)

## 2017-02-20 LAB — HEMOGLOBIN A1C: Hgb A1c MFr Bld: 5.8 % (ref 4.6–6.5)

## 2017-02-20 NOTE — Patient Instructions (Addendum)
Attention to healthy eating activity. Can do a referral in regard to your foot pain. Podiatry  probablypain is from back arthritis and may benefit from a either physical therapy or sports medicine or rehab referral for back pain.  Can do the cologuard test for colon cancer screening. Since  last colonoscopy was clear and negative family history.  Will notify you  of labs when available.   Fu depnding on labs or  Yearly      Health Maintenance, Male A healthy lifestyle and preventive care is important for your health and wellness. Ask your health care provider about what schedule of regular examinations is right for you. What should I know about weight and diet?  Eat a Healthy Diet  Eat plenty of vegetables, fruits, whole grains, low-fat dairy products, and lean protein.  Do not eat a lot of foods high in solid fats, added sugars, or salt. Maintain a Healthy Weight  Regular exercise can help you achieve or maintain a healthy weight. You should:  Do at least 150 minutes of exercise each week. The exercise should increase your heart rate and make you sweat (moderate-intensity exercise).  Do strength-training exercises at least twice a week. Watch Your Levels of Cholesterol and Blood Lipids  Have your blood tested for lipids and cholesterol every 5 years starting at 78 years of age. If you are at high risk for heart disease, you should start having your blood tested when you are 78 years old. You may need to have your cholesterol levels checked more often if:  Your lipid or cholesterol levels are high.  You are older than 78 years of age.  You are at high risk for heart disease. What should I know about cancer screening? Many types of cancers can be detected early and may often be prevented. Lung Cancer  You should be screened every year for lung cancer if:  You are a current smoker who has smoked for at least 30 years.  You are a former smoker who has quit within the past 15  years.  Talk to your health care provider about your screening options, when you should start screening, and how often you should be screened. Colorectal Cancer  Routine colorectal cancer screening usually begins at 78 years of age and should be repeated every 5-10 years until you are 79 years old. You may need to be screened more often if early forms of precancerous polyps or small growths are found. Your health care provider may recommend screening at an earlier age if you have risk factors for colon cancer.  Your health care provider may recommend using home test kits to check for hidden blood in the stool.  A small camera at the end of a tube can be used to examine your colon (sigmoidoscopy or colonoscopy). This checks for the earliest forms of colorectal cancer. Prostate and Testicular Cancer  Depending on your age and overall health, your health care provider may do certain tests to screen for prostate and testicular cancer.  Talk to your health care provider about any symptoms or concerns you have about testicular or prostate cancer. Skin Cancer  Check your skin from head to toe regularly.  Tell your health care provider about any new moles or changes in moles, especially if:  There is a change in a mole's size, shape, or color.  You have a mole that is larger than a pencil eraser.  Always use sunscreen. Apply sunscreen liberally and repeat throughout the day.  Protect yourself by wearing long sleeves, pants, a wide-brimmed hat, and sunglasses when outside. What should I know about heart disease, diabetes, and high blood pressure?  If you are 1-3 years of age, have your blood pressure checked every 3-5 years. If you are 16 years of age or older, have your blood pressure checked every year. You should have your blood pressure measured twice-once when you are at a hospital or clinic, and once when you are not at a hospital or clinic. Record the average of the two measurements. To  check your blood pressure when you are not at a hospital or clinic, you can use:  An automated blood pressure machine at a pharmacy.  A home blood pressure monitor.  Talk to your health care provider about your target blood pressure.  If you are between 51-49 years old, ask your health care provider if you should take aspirin to prevent heart disease.  Have regular diabetes screenings by checking your fasting blood sugar level.  If you are at a normal weight and have a low risk for diabetes, have this test once every three years after the age of 75.  If you are overweight and have a high risk for diabetes, consider being tested at a younger age or more often.  A one-time screening for abdominal aortic aneurysm (AAA) by ultrasound is recommended for men aged 31-75 years who are current or former smokers. What should I know about preventing infection? Hepatitis B  If you have a higher risk for hepatitis B, you should be screened for this virus. Talk with your health care provider to find out if you are at risk for hepatitis B infection. Hepatitis C  Blood testing is recommended for:  Everyone born from [birth year] through 1967.  Anyone with known risk factors for hepatitis C. Sexually Transmitted Diseases (STDs)  You should be screened each year for STDs including gonorrhea and chlamydia if:  You are sexually active and are younger than 78 years of age.  You are older than 78 years of age and your health care provider tells you that you are at risk for this type of infection.  Your sexual activity has changed since you were last screened and you are at an increased risk for chlamydia or gonorrhea. Ask your health care provider if you are at risk.  Talk with your health care provider about whether you are at high risk of being infected with HIV. Your health care provider may recommend a prescription medicine to help prevent HIV infection. What else can I do?  Schedule regular health,  dental, and eye exams.  Stay current with your vaccines (immunizations).  Do not use any tobacco products, such as cigarettes, chewing tobacco, and e-cigarettes. If you need help quitting, ask your health care provider.  Limit alcohol intake to no more than 2 drinks per day. One drink equals 12 ounces of beer, 5 ounces of wine, or 1 ounces of hard liquor.  Do not use street drugs.  Do not share needles.  Ask your health care provider for help if you need support or information about quitting drugs.  Tell your health care provider if you often feel depressed.  Tell your health care provider if you have ever been abused or do not feel safe at home. This information is not intended to replace advice given to you by your health care provider. Make sure you discuss any questions you have with your health care provider. Document Released: 06/08/2008 Document Revised:  08/09/2016 Document Reviewed: 09/14/2015 Elsevier Interactive Patient Education  2017 [employer].

## 2017-02-21 NOTE — Telephone Encounter (Signed)
Sent to the pharmacy by e-scribe. 

## 2017-03-06 ENCOUNTER — Ambulatory Visit (INDEPENDENT_AMBULATORY_CARE_PROVIDER_SITE_OTHER): Payer: Medicare Other | Admitting: Podiatry

## 2017-03-06 ENCOUNTER — Encounter: Payer: Self-pay | Admitting: Podiatry

## 2017-03-06 ENCOUNTER — Ambulatory Visit (HOSPITAL_BASED_OUTPATIENT_CLINIC_OR_DEPARTMENT_OTHER)
Admission: RE | Admit: 2017-03-06 | Discharge: 2017-03-06 | Disposition: A | Discharge status: Home / Self Care | Payer: Medicare Other | Source: Ambulatory Visit | Attending: Podiatry | Admitting: Podiatry

## 2017-03-06 DIAGNOSIS — M779 Enthesopathy, unspecified: Secondary | ICD-10-CM | POA: Diagnosis not present

## 2017-03-06 DIAGNOSIS — L84 Corns and callosities: Secondary | ICD-10-CM

## 2017-03-06 DIAGNOSIS — M19071 Primary osteoarthritis, right ankle and foot: Secondary | ICD-10-CM | POA: Insufficient documentation

## 2017-03-06 DIAGNOSIS — M19072 Primary osteoarthritis, left ankle and foot: Secondary | ICD-10-CM | POA: Insufficient documentation

## 2017-03-06 DIAGNOSIS — M21629 Bunionette of unspecified foot: Secondary | ICD-10-CM

## 2017-03-06 DIAGNOSIS — M85871 Other specified disorders of bone density and structure, right ankle and foot: Secondary | ICD-10-CM | POA: Diagnosis not present

## 2017-03-06 DIAGNOSIS — M216X1 Other acquired deformities of right foot: Secondary | ICD-10-CM | POA: Diagnosis not present

## 2017-03-06 NOTE — Progress Notes (Signed)
   Subjective:    Patient ID: [name], male    DOB: [birth year], 76 y.o.   MRN: [id number]  HPI  [name] presents to the office today for concerns of calluses to the ball of both of his feet and points to submetatarsal 5 where he gets them. He usually tries to cut them himself but they have gotten to thick and he can longer take care of them. Denies any surrounding redness, drainage, or other signs of infection.   Review of Systems  All other systems reviewed and are negative.      Objective:   Physical Exam General: AAO x3, NAD  Dermatological: Hyperkertotic lesion bilateral submet 5. No underlying ulceration, drainage, or other signs of infection. No open lesions.   Vascular: Dorsalis Pedis artery and Posterior Tibial artery pedal pulses are 2/4 bilateral with immedate capillary fill time.There is no pain with calf compression, swelling, warmth, erythema.   Neruologic: Grossly intact via light touch bilateral. Vibratory intact via tuning fork bilateral. Protective threshold with Semmes Wienstein monofilament intact to all pedal sites bilateral.   Musculoskeletal: Tailors bunion is present bilaterally.Prominence of the metatarsal heads. Muscular strength 5/5 in all groups tested bilateral.  Gait: Unassisted, Nonantalgic.      Assessment & Plan:  76 year old male with hyperkeratotic lesions -Treatment options discussed including all alternatives, risks, and complications -Etiology of symptoms were discussed -X-rays were obtained and reviewed with the patient.  -Lesions sharply debrided x 2 without complications or bleeding.  -Offloading pads. -RTC in 3 months or sooner if needed.   Celesta Gentile, DPM

## 2017-03-06 NOTE — Patient Instructions (Signed)
Corns and Calluses Corns are small areas of thickened skin that occur on the top, sides, or tip of a toe. They contain a cone-shaped core with a point that can press on a nerve below. This causes pain. Calluses are areas of thickened skin that can occur anywhere on the body including hands, fingers, palms, soles of the feet, and heels.Calluses are usually larger than corns. What are the causes? Corns and calluses are caused by rubbing (friction) or pressure, such as from shoes that are too tight or do not fit properly. What increases the risk? Corns are more likely to develop in people who have toe deformities, such as hammer toes. Since calluses can occur with friction to any area of the skin, calluses are more likely to develop in people who:  Work with their hands.  Wear shoes that fit poorly, shoes that are too tight, or shoes that are high-heeled.  Have toes deformities. What are the signs or symptoms? Symptoms of a corn or callus include:  A hard growth on the skin.  Pain or tenderness under the skin.  Redness and swelling.  Increased discomfort while wearing tight-fitting shoes. How is this diagnosed? Corns and calluses may be diagnosed with a medical history and physical exam. How is this treated? Corns and calluses may be treated with:  Removing the cause of the friction or pressure. This may include:  Changing your shoes.  Wearing shoe inserts (orthotics) or other protective layers in your shoes, such as a corn pad.  Wearing gloves.  Medicines to help soften skin in the hardened, thickened areas.  Reducing the size of the corn or callus by removing the dead layers of skin.  Antibiotic medicines to treat infection.  Surgery, if a toe deformity is the cause. Follow these instructions at home:  Take medicines only as directed by your health care provider.  If you were prescribed an antibiotic, finish all of it even if you start to feel better.  Wear shoes that  fit well. Avoid wearing high-heeled shoes and shoes that are too tight or too loose.  Wear any padding, protective layers, gloves, or orthotics as directed by your health care provider.  Soak your hands or feet and then use a file or pumice stone to soften your corn or callus. Do this as directed by your health care provider.  Check your corn or callus every day for signs of infection. Watch for:  Redness, swelling, or pain.  Fluid, blood, or pus. Contact a health care provider if:  Your symptoms do not improve with treatment.  You have increased redness, swelling, or pain at the site of your corn or callus.  You have fluid, blood, or pus coming from your corn or callus.  You have new symptoms. This information is not intended to replace advice given to you by your health care provider. Make sure you discuss any questions you have with your health care provider. Document Released: 09/16/2004 Document Revised: 06/30/2016 Document Reviewed: 12/07/2014 Elsevier Interactive Patient Education  2017 Elsevier Inc.  

## 2017-03-07 DIAGNOSIS — Z1212 Encounter for screening for malignant neoplasm of rectum: Secondary | ICD-10-CM | POA: Diagnosis not present

## 2017-03-07 DIAGNOSIS — Z1211 Encounter for screening for malignant neoplasm of colon: Secondary | ICD-10-CM | POA: Diagnosis not present

## 2017-03-07 LAB — COLOGUARD: COLOGUARD: POSITIVE

## 2017-03-09 ENCOUNTER — Other Ambulatory Visit: Payer: Self-pay | Admitting: Internal Medicine

## 2017-03-14 ENCOUNTER — Encounter: Payer: Self-pay | Admitting: Physical Medicine & Rehabilitation

## 2017-03-14 ENCOUNTER — Telehealth: Payer: Self-pay | Admitting: Internal Medicine

## 2017-03-14 MED ORDER — TAMSULOSIN HCL 0.4 MG PO CAPS: 0.4000 mg | ORAL_CAPSULE | Freq: Every day | ORAL | 0 refills | Status: DC

## 2017-03-14 NOTE — Telephone Encounter (Signed)
Pts wife is calling in stating this Rx was called in on 3/19 by the pharmacy and now the pt is out of the Rx as of last night and would like to see if it can be refilled today.

## 2017-03-14 NOTE — Telephone Encounter (Signed)
Sent in medication

## 2017-03-14 NOTE — Telephone Encounter (Signed)
° ° ° ° °  Pt request refill of the following:  tamsulosin (FLOMAX) 0.4 MG CAPS capsule   Phamacy:  [facility] [address] [facility]

## 2017-03-16 ENCOUNTER — Encounter: Payer: Self-pay | Admitting: Family Medicine

## 2017-03-20 ENCOUNTER — Telehealth: Payer: Self-pay | Admitting: Emergency Medicine

## 2017-03-20 NOTE — Telephone Encounter (Signed)
Spoke with pt and he agreed to have the colonoscopy done but doesn't not want to go to [facility]. Pt states that he had a bad experience there and was in too much pain. Pt states he would like to go to the hospital and have it done since that's where his wife went.

## 2017-03-20 NOTE — Telephone Encounter (Signed)
error 

## 2017-03-21 NOTE — Telephone Encounter (Signed)
If you can explain to him that we are using different sedation than we were using previously and it is very unlikely he will have those same issues (using propofol now) and so I recommend we still go with this at [facility].  If he still declines, let me know and we'll reach out from our end.  Thanks  dj

## 2017-03-21 NOTE — Telephone Encounter (Signed)
Could you get your staff  To call and explain that to him please  And then go from there (  I think more expeditious  . Thanks  [facility]

## 2017-03-21 NOTE — Telephone Encounter (Signed)
I will send a message to  Dr Ardis Hughs   About getting this done at hospital  And then go from there. Marland Kitchen

## 2017-03-21 NOTE — Telephone Encounter (Signed)
Happy to.  Patty can you call him to discuss colonoscoyp in [place] for + cologuard.  He is nervous about previous sedation in [place], but we have deep sedation with propofol now.

## 2017-03-22 NOTE — Telephone Encounter (Signed)
The pt was advised of the new sedation and all questions answered.  He is agreeable to have colon at the [facility].  Previsit and colon scheduled.

## 2017-03-26 ENCOUNTER — Ambulatory Visit (INDEPENDENT_AMBULATORY_CARE_PROVIDER_SITE_OTHER): Payer: Medicare Other | Admitting: Nurse Practitioner

## 2017-03-26 ENCOUNTER — Encounter: Payer: Self-pay | Admitting: Nurse Practitioner

## 2017-03-26 VITALS — BP 118/80 | HR 72 | Ht 67.5 in | Wt 191.8 lb

## 2017-03-26 DIAGNOSIS — I2510 Atherosclerotic heart disease of native coronary artery without angina pectoris: Secondary | ICD-10-CM | POA: Diagnosis not present

## 2017-03-26 MED ORDER — METOPROLOL TARTRATE 50 MG PO TABS: ORAL_TABLET | ORAL | 3 refills | Status: DC

## 2017-03-26 NOTE — Progress Notes (Signed)
CARDIOLOGY OFFICE NOTE  Date:  03/26/2017    [name] Date of Birth: April 16, 1941 Medical Record #[id number]  PCP:  Lottie Dawson, MD  Cardiologist:  Johnsie Cancel   Chief Complaint  Patient presents with  . Coronary Artery Disease    1 year check - seen for Dr. Johnsie Cancel    History of Present Illness: [name] is a 76 y.o. male who presents today for a follow up visit. Seen for Dr. Johnsie Cancel.   He has a history of known CAD with prior CABG in 2009. Other issues include HLD, BPH and HTN. He had repeat cath back in 2013 with patent grafts noted. Lesion in a small unbypassed OM to be managed medically.    Comes in today. Here alone. He notes he is doing well other than palpitations - which seem to be getting worse. He feels his heart skip/flip and beat hard. Does not use much in the way of caffeine. Happens mostly at night. Recent labs noted. Has been pretty sedentary over the past year - just started back riding his bike last night - did fine with that. No angina. Recent physical with PCP.   Past Medical History:  Diagnosis Date  . Abdominal pain, right lateral 02/21/2012   progressive.    . ALLERGIC RHINITIS 07/23/2007  . Arthritis   . Atrial fibrillation ([place]) 07/06/2009  . BACK PAIN 11/02/2009  . BACK PAIN, LUMBAR 10/25/2010  . BENIGN PROSTATIC HYPERTROPHY 07/25/2007  . 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 CAD 01/22/2009  . Cancer ([place])    skin  . 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  . COLONIC POLYPS, HX OF 10/18/2007  . Cough 10/25/2010  . DIZZINESS 01/22/2009  . DYSPNEA ON EXERTION 08/21/2008  . ECHOCARDIOGRAM, ABNORMAL 08/19/2007  . GERD 07/25/2007  . HERNIA 07/06/2009  . HX, PERSONAL, MALIGNANCY, SKIN NEC 10/18/2007  . HYPERGLYCEMIA, BORDERLINE 12/31/2007  . HYPERLIPIDEMIA 07/25/2007  . HYPERTENSION 07/23/2007  . Inguinal hernia   . LUMBAR RADICULOPATHY, RIGHT 10/18/2007  . MUSCLE PAIN 01/22/2009  . Nausea   . NEURITIS 10/25/2010  . Palpitations 05/09/2010  .  RHINITIS, CHRONIC 05/11/2010  . RUQ PAIN 01/22/2009  . Sinus polyp   . UNS ADVRS EFF OTH RX MEDICINAL&BIOLOGICAL SBSTNC 04/15/2008  . 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 VITAMIN D DEFICIENCY 11/02/2009  . Voice strain     Past Surgical History:  Procedure Laterality Date  . BACK SURGERY    . basal cell and melanoma removed     face  . BONY PELVIS SURGERY    . CATARACT EXTRACTION     BIL  . COLONOSCOPY    . CORONARY ARTERY BYPASS GRAFT  2009  . HERNIA REPAIR  7 times   inguinal  . LAPAROSCOPIC CHOLECYSTECTOMY  03/03/2013  . TONSILLECTOMY       Medications: Current Outpatient Prescriptions  Medication Sig Dispense Refill  . aspirin 81 MG tablet Take 81 mg by mouth daily.      Marland Kitchen atorvastatin (LIPITOR) 40 MG tablet Take 1 tablet (40 mg total) by mouth every other day. 45 tablet 1  . clobetasol (OLUX) 0.05 % topical foam APPLY TO AFFECTED AREA TWICE A DAY  10  . clobetasol (TEMOVATE) 0.05 % external solution APPLY TO AFFECTED AREA ONCE TO TWICE DAILY  10  . Coenzyme Q10 (COQ-10 PO) Take by mouth.    . losartan (COZAAR) 50 MG tablet TAKE 1 TABLET (50 MG TOTAL) BY MOUTH DAILY. 90 tablet 0  . metoprolol (LOPRESSOR)  50 MG tablet 25 mg in the AM and 50 mg in the PM 135 tablet 3  . MULTIPLE VITAMIN PO Take 1 tablet by mouth daily.     Marland Kitchen MYRBETRIQ 25 MG TB24 tablet Take 25 mg by mouth daily.  0  . NITROSTAT 0.4 MG SL tablet PLACE 1 TABLET UNDER TONGUE EVERY 5 MINUTES AS NEEDED FOR CHEST PAIN 25 tablet 1  . RABEprazole (ACIPHEX) 20 MG tablet TAKE 1 TABLET BY MOUTH ONCE DAILY 90 tablet 1  . tamsulosin (FLOMAX) 0.4 MG CAPS capsule Take 1 capsule (0.4 mg total) by mouth daily. 90 capsule 0   No current facility-administered medications for this visit.     Allergies: Allergies  Allergen Reactions  . Propoxyphene N-Acetaminophen Nausea And Vomiting  . Iodinated Diagnostic Agents Swelling    Pt developed slight lt upper lip swelling only to one side about 15 minutes post IV contrast injection. No  other symptoms.     Social History: The patient  reports that he quit smoking about 53 years ago. His smoking use included Cigarettes. He has never used smokeless tobacco. He reports that he does not drink alcohol or use drugs.   Family History: The patient's family history includes Alcohol abuse in his brother, brother, brother, and father; Breast cancer in his sister; Cancer in his mother and sister; Heart attack in his father; Heart disease in his father; Hypertension in his brother, brother, brother, and father; Kidney disease in his sister; Lung cancer in his mother.   Review of Systems: Please see the history of present illness.   Otherwise, the review of systems is positive for none.   All other systems are reviewed and negative.   Physical Exam: VS:  BP 118/80   Pulse 72   Ht 5' 7.5" (1.715 m)   Wt 191 lb 12.8 oz (87 kg)   BMI 29.60 kg/m  .  BMI Body mass index is 29.6 kg/m.  Wt Readings from Last 3 Encounters:  03/26/17 191 lb 12.8 oz (87 kg)  02/20/17 195 lb 12.8 oz (88.8 kg)  12/05/16 209 lb 14.4 oz (95.2 kg)    General: Pleasant. Elderly male who is alert and in no acute distress.   HEENT: Normal.  Neck: Supple, no JVD, carotid bruits, or masses noted.  Cardiac: Regular rate and rhythm. He does have an occasional ectopic noted. Soft outflow murmur. No edema.  Respiratory:  Lungs are clear to auscultation bilaterally with normal work of breathing.  GI: Soft and nontender.  MS: No deformity or atrophy. Gait and ROM intact.  Skin: Warm and dry. Color is normal.  Neuro:  Strength and sensation are intact and no gross focal deficits noted.  Psych: Alert, appropriate and with normal affect.    LABORATORY DATA:  EKG:  EKG is ordered today. This demonstrates NSR with 1st degree AV block.  Lab Results  Component Value Date   WBC 7.3 02/20/2017   HGB 15.4 02/20/2017   HCT 44.1 02/20/2017   PLT 208.0 02/20/2017   GLUCOSE 89 02/20/2017   CHOL 148 02/20/2017   TRIG  80.0 02/20/2017   HDL 37.00 (L) 02/20/2017   LDLDIRECT 187.2 03/31/2008   LDLCALC 95 02/20/2017   ALT 22 02/20/2017   AST 16 02/20/2017   NA 139 02/20/2017   K 4.2 02/20/2017   CL 105 02/20/2017   CREATININE 0.66 02/20/2017   BUN 18 02/20/2017   CO2 28 02/20/2017   TSH 1.65 02/09/2016   PSA 1.88 09/03/2012  INR 1.1 (H) 01/25/2012   HGBA1C 5.8 02/20/2017    BNP (last 3 results) No results for input(s): BNP in the last 8760 hours.  ProBNP (last 3 results) No results for input(s): PROBNP in the last 8760 hours.   Other Studies Reviewed Today:   Assessment/Plan:  1. CAD - no active symptoms - encouraged continued CV risk factor modification.  2. Palpitations - had some ectopics on exam today - suspect PAC's - not really using much caffeine - will try to increase his beta blocker but cautiously with his 1st degree AV block. Recheck EKG in 2 weeks.   3. HTN - BP is fine today on his current regimen.   3. HLD - on statin - recent labs by PCP. Encouraged CV risk factor modification as well.   Current medicines are reviewed with the patient today.  The patient does not have concerns regarding medicines other than what has been noted above.  The following changes have been made:  See above.  Labs/ tests ordered today include:    Orders Placed This Encounter  Procedures  . EKG 12-Lead     Disposition:   FU with Dr. Johnsie Cancel in 6 months.   Patient is agreeable to this plan and will call if any problems develop in the interim.   SignedTruitt Merle, NP  03/26/2017 11:09 AM  [facility] [address] [place], [place]  [address] Phone: [phone] Fax: [phone]

## 2017-03-26 NOTE — Patient Instructions (Addendum)
We will be checking the following labs today - NONE   Medication Instructions:    Continue with your current medicines. BUT  I want you to take 1/2 tablet metoprolol in the AM and a whole tablet in the PM - this should help with your palpitations. I sent this to your drug store.     Testing/Procedures To Be Arranged:  N/A  Follow-Up:   EKG/nurse visit here in 2 weeks  See Dr. Johnsie Cancel in 6 months    Other Special Instructions:   Keep riding your bike!    If you need a refill on your cardiac medications before your next appointment, please call your pharmacy.   Call the [place] office at [phone] if you have any questions, problems or concerns.

## 2017-03-30 ENCOUNTER — Encounter: Payer: Medicare Other | Admitting: Physical Medicine & Rehabilitation

## 2017-04-02 ENCOUNTER — Other Ambulatory Visit: Payer: Self-pay | Admitting: Cardiovascular Disease

## 2017-04-09 ENCOUNTER — Other Ambulatory Visit: Payer: Self-pay | Admitting: *Deleted

## 2017-04-09 ENCOUNTER — Ambulatory Visit (INDEPENDENT_AMBULATORY_CARE_PROVIDER_SITE_OTHER): Payer: Medicare Other | Admitting: *Deleted

## 2017-04-09 DIAGNOSIS — Z8679 Personal history of other diseases of the circulatory system: Secondary | ICD-10-CM | POA: Diagnosis not present

## 2017-04-10 ENCOUNTER — Other Ambulatory Visit: Payer: Medicare Other

## 2017-04-10 ENCOUNTER — Ambulatory Visit (AMBULATORY_SURGERY_CENTER): Payer: Self-pay

## 2017-04-10 VITALS — Ht 67.0 in | Wt 189.8 lb

## 2017-04-10 DIAGNOSIS — R195 Other fecal abnormalities: Secondary | ICD-10-CM

## 2017-04-10 MED ORDER — NA SULFATE-K SULFATE-MG SULF 17.5-3.13-1.6 GM/177ML PO SOLN: 1.0000 | Freq: Once | ORAL | 0 refills | Status: AC

## 2017-04-10 NOTE — Progress Notes (Signed)
Denies allergies to eggs or soy products. Denies complication of anesthesia or sedation. Denies use of weight loss medication. Denies use of O2.   Emmi instructions given for colonoscopy.  

## 2017-04-17 ENCOUNTER — Ambulatory Visit (AMBULATORY_SURGERY_CENTER): Payer: Medicare Other | Admitting: Gastroenterology

## 2017-04-17 ENCOUNTER — Encounter: Payer: Self-pay | Admitting: Gastroenterology

## 2017-04-17 VITALS — BP 123/71 | HR 60 | Temp 97.3°F | Resp 13 | Ht 67.0 in | Wt 189.0 lb

## 2017-04-17 DIAGNOSIS — R195 Other fecal abnormalities: Secondary | ICD-10-CM | POA: Diagnosis not present

## 2017-04-17 DIAGNOSIS — R933 Abnormal findings on diagnostic imaging of other parts of digestive tract: Secondary | ICD-10-CM | POA: Diagnosis not present

## 2017-04-17 DIAGNOSIS — D123 Benign neoplasm of transverse colon: Secondary | ICD-10-CM | POA: Diagnosis not present

## 2017-04-17 DIAGNOSIS — K5730 Diverticulosis of large intestine without perforation or abscess without bleeding: Secondary | ICD-10-CM

## 2017-04-17 DIAGNOSIS — D122 Benign neoplasm of ascending colon: Secondary | ICD-10-CM | POA: Diagnosis not present

## 2017-04-17 DIAGNOSIS — I10 Essential (primary) hypertension: Secondary | ICD-10-CM | POA: Diagnosis not present

## 2017-04-17 DIAGNOSIS — D124 Benign neoplasm of descending colon: Secondary | ICD-10-CM | POA: Diagnosis not present

## 2017-04-17 DIAGNOSIS — I2510 Atherosclerotic heart disease of native coronary artery without angina pectoris: Secondary | ICD-10-CM | POA: Diagnosis not present

## 2017-04-17 DIAGNOSIS — I4891 Unspecified atrial fibrillation: Secondary | ICD-10-CM | POA: Diagnosis not present

## 2017-04-17 DIAGNOSIS — E669 Obesity, unspecified: Secondary | ICD-10-CM | POA: Diagnosis not present

## 2017-04-17 MED ORDER — SODIUM CHLORIDE 0.9 % IV SOLN: 500.0000 mL | INTRAVENOUS | Status: DC

## 2017-04-17 NOTE — Progress Notes (Signed)
Called to room to assist during endoscopic procedure.  Patient ID and intended procedure confirmed with present staff. Received instructions for my participation in the procedure from the performing physician.  

## 2017-04-17 NOTE — Op Note (Signed)
[facility] Patient Name: [name] Procedure Date: 04/17/2017 9:42 AM MRN: [id number] Endoscopist: Milus Banister , MD Age: 76 Referring MD:  Date of Birth: [birth year] Gender: Male Account #: [id number] Procedure:                Colonoscopy Indications:              Positive Cologuard test Medicines:                Monitored Anesthesia Care Procedure:                Pre-Anesthesia Assessment:                           - Prior to the procedure, a History and Physical                            was performed, and patient medications and                            allergies were reviewed. The patient's tolerance of                            previous anesthesia was also reviewed. The risks                            and benefits of the procedure and the sedation                            options and risks were discussed with the patient.                            All questions were answered, and informed consent                            was obtained. Prior Anticoagulants: The patient has                            taken no previous anticoagulant or antiplatelet                            agents. ASA Grade Assessment: II - A patient with                            mild systemic disease. After reviewing the risks                            and benefits, the patient was deemed in                            satisfactory condition to undergo the procedure.                           After obtaining informed consent, the colonoscope  was passed under direct vision. Throughout the                            procedure, the patient's blood pressure, pulse, and                            oxygen saturations were monitored continuously. The                            Colonoscope was introduced through the anus and                            advanced to the the cecum, identified by                            appendiceal orifice and ileocecal valve. The                             colonoscopy was performed without difficulty. The                            patient tolerated the procedure well. The quality                            of the bowel preparation was good. The ileocecal                            valve, appendiceal orifice, and rectum were                            photographed. Scope In: 9:53:24 AM Scope Out: 10:08:23 AM Scope Withdrawal Time: 0 hours 10 minutes 28 seconds  Total Procedure Duration: 0 hours 14 minutes 59 seconds  Findings:                 Four sessile polyps were found in the descending                            colon, transverse colon and ascending colon. The                            polyps were 4 to 5 mm in size. These polyps were                            removed with a cold snare. Resection and retrieval                            were complete.                           Multiple small and large-mouthed diverticula were                            found in the left colon.  The exam was otherwise without abnormality on                            direct and retroflexion views. Complications:            No immediate complications. Estimated blood loss:                            None. Estimated Blood Loss:     Estimated blood loss: none. Impression:               - Four 4 to 5 mm polyps in the descending colon, in                            the transverse colon and in the ascending colon,                            removed with a cold snare. Resected and retrieved.                           - Diverticulosis in the left colon.                           - The examination was otherwise normal on direct                            and retroflexion views. Recommendation:           - Patient has a contact number available for                            emergencies. The signs and symptoms of potential                            delayed complications were discussed with the                             patient. Return to normal activities tomorrow.                            Written discharge instructions were provided to the                            patient.                           - Resume previous diet.                           - Continue present medications.                           You will receive a letter within 2-3 weeks with the                            pathology results and my final recommendations.  If the polyp(s) is proven to be 'pre-cancerous' on                            pathology, you will need repeat colonoscopy in 3                            years. Milus Banister, MD 04/17/2017 10:13:05 AM This report has been signed electronically.

## 2017-04-17 NOTE — Patient Instructions (Signed)
Impression/recommendations:  Polyps (handout given) Diverticulosis (handout given) High Fiber Diet (handout given)  YOU HAD AN ENDOSCOPIC PROCEDURE TODAY AT THE [facility]:   Refer to the procedure report that was given to you for any specific questions about what was found during the examination.  If the procedure report does not answer your questions, please call your gastroenterologist to clarify.  If you requested that your care partner not be given the details of your procedure findings, then the procedure report has been included in a sealed envelope for you to review at your convenience later.  YOU SHOULD EXPECT: Some feelings of bloating in the abdomen. Passage of more gas than usual.  Walking can help get rid of the air that was put into your GI tract during the procedure and reduce the bloating. If you had a lower endoscopy (such as a colonoscopy or flexible sigmoidoscopy) you may notice spotting of blood in your stool or on the toilet paper. If you underwent a bowel prep for your procedure, you may not have a normal bowel movement for a few days.  Please Note:  You might notice some irritation and congestion in your nose or some drainage.  This is from the oxygen used during your procedure.  There is no need for concern and it should clear up in a day or so.  SYMPTOMS TO REPORT IMMEDIATELY:   Following lower endoscopy (colonoscopy or flexible sigmoidoscopy):  Excessive amounts of blood in the stool  Significant tenderness or worsening of abdominal pains  Swelling of the abdomen that is new, acute  Fever of 100F or higher   For urgent or emergent issues, a gastroenterologist can be reached at any hour by calling [phone].   DIET:  We do recommend a small meal at first, but then you may proceed to your regular diet.  Drink plenty of fluids but you should avoid alcoholic beverages for 24 hours.  ACTIVITY:  You should plan to take it easy for the rest of today  and you should NOT DRIVE or use heavy machinery until tomorrow (because of the sedation medicines used during the test).    FOLLOW UP: Our staff will call the number listed on your records the next business day following your procedure to check on you and address any questions or concerns that you may have regarding the information given to you following your procedure. If we do not reach you, we will leave a message.  However, if you are feeling well and you are not experiencing any problems, there is no need to return our call.  We will assume that you have returned to your regular daily activities without incident.  If any biopsies were taken you will be contacted by phone or by letter within the next 1-3 weeks.  Please call us at [phone] if you have not heard about the biopsies in 3 weeks.    SIGNATURES/CONFIDENTIALITY: You and/or your care partner have signed paperwork which will be entered into your electronic medical record.  These signatures attest to the fact that that the information above on your After Visit Summary has been reviewed and is understood.  Full responsibility of the confidentiality of this discharge information lies with you and/or your care-partner. 

## 2017-04-17 NOTE — Progress Notes (Signed)
Report to PACU, RN, vss, BBS= Clear.  

## 2017-04-18 ENCOUNTER — Telehealth: Payer: Self-pay | Admitting: *Deleted

## 2017-04-18 NOTE — Telephone Encounter (Signed)
  Follow up Call-  Call back number 04/17/2017  Post procedure Call Back phone  # [phone]  Permission to leave phone message Yes  Some recent data might be hidden     Patient questions:  Do you have a fever, pain , or abdominal swelling? No. Pain Score  0 *  Have you tolerated food without any problems? Yes.    Have you been able to return to your normal activities? Yes.    Do you have any questions about your discharge instructions: Diet   No. Medications  No. Follow up visit  No.  Do you have questions or concerns about your Care? No.  Actions: * If pain score is 4 or above: No action needed, pain <4.

## 2017-04-20 ENCOUNTER — Encounter: Payer: Self-pay | Admitting: Gastroenterology

## 2017-05-22 ENCOUNTER — Other Ambulatory Visit: Payer: Self-pay | Admitting: Internal Medicine

## 2017-05-22 NOTE — Telephone Encounter (Signed)
Sent to the pharmacy by e-scribe.  Pt seen 01/2017 for yearly.  Should return in 1 year.

## 2017-06-11 ENCOUNTER — Other Ambulatory Visit: Payer: Self-pay | Admitting: Internal Medicine

## 2017-06-28 ENCOUNTER — Other Ambulatory Visit: Payer: Self-pay | Admitting: Cardiovascular Disease

## 2017-07-10 ENCOUNTER — Telehealth: Payer: Self-pay | Admitting: Internal Medicine

## 2017-07-10 ENCOUNTER — Other Ambulatory Visit: Payer: Self-pay | Admitting: Emergency Medicine

## 2017-07-10 MED ORDER — ATORVASTATIN CALCIUM 40 MG PO TABS: 40.0000 mg | ORAL_TABLET | ORAL | 1 refills | Status: DC

## 2017-07-10 NOTE — Telephone Encounter (Signed)
° °  Pt request refill of the following:   atorvastatin (LIPITOR) 40 MG tablet  Phamacy:  [facility]

## 2017-07-10 NOTE — Telephone Encounter (Signed)
I can't explain why the last refill was so long ago unless it was refilled  by cardiology in the interim. Contact the patient about who is been prescribing the Lipitor and how taking  to avoid confusion      and it is okay to refill for 6 months

## 2017-07-10 NOTE — Telephone Encounter (Signed)
Last refill 08/25/2015. Please advise

## 2017-07-10 NOTE — Telephone Encounter (Signed)
Noted thanks °

## 2017-07-10 NOTE — Telephone Encounter (Signed)
Patient states that Dr. Regis Bill has been refilling the medication. But patient has his days when he doesn't medication  like he is suppose too so he is just now running out of the medication. Sent in a 6 month supply

## 2017-08-31 DIAGNOSIS — Z23 Encounter for immunization: Secondary | ICD-10-CM | POA: Diagnosis not present

## 2017-09-07 ENCOUNTER — Other Ambulatory Visit: Payer: Self-pay | Admitting: Internal Medicine

## 2017-09-14 ENCOUNTER — Encounter: Payer: Self-pay | Admitting: Internal Medicine

## 2017-10-17 ENCOUNTER — Ambulatory Visit: Payer: Medicare Other | Admitting: Cardiovascular Disease

## 2017-10-30 ENCOUNTER — Ambulatory Visit (INDEPENDENT_AMBULATORY_CARE_PROVIDER_SITE_OTHER): Payer: Medicare Other | Admitting: Nurse Practitioner

## 2017-10-30 ENCOUNTER — Encounter: Payer: Self-pay | Admitting: Nurse Practitioner

## 2017-10-30 VITALS — BP 150/80 | HR 67 | Ht 67.5 in | Wt 196.0 lb

## 2017-10-30 DIAGNOSIS — I259 Chronic ischemic heart disease, unspecified: Secondary | ICD-10-CM

## 2017-10-30 DIAGNOSIS — R011 Cardiac murmur, unspecified: Secondary | ICD-10-CM | POA: Diagnosis not present

## 2017-10-30 DIAGNOSIS — R002 Palpitations: Secondary | ICD-10-CM | POA: Diagnosis not present

## 2017-10-30 DIAGNOSIS — E7849 Other hyperlipidemia: Secondary | ICD-10-CM

## 2017-10-30 LAB — LIPID PANEL
Chol/HDL Ratio: 3.4 ratio (ref 0.0–5.0)
Cholesterol, Total: 152 mg/dL (ref 100–199)
HDL: 45 mg/dL (ref >39)
LDL Calculated: 94 mg/dL (ref 0–99)
Triglycerides: 65 mg/dL (ref 0–149)
VLDL Cholesterol Cal: 13 mg/dL (ref 5–40)

## 2017-10-30 LAB — CBC
Hematocrit: 43.4 % (ref 37.5–51.0)
Hemoglobin: 15.3 g/dL (ref 13.0–17.7)
MCH: 33 pg (ref 26.6–33.0)
MCHC: 35.3 g/dL (ref 31.5–35.7)
MCV: 94 fL (ref 79–97)
Platelets: 250 10*3/uL (ref 150–379)
RBC: 4.63 x10E6/uL (ref 4.14–5.80)
RDW: 13.5 % (ref 12.3–15.4)
WBC: 6.8 10*3/uL (ref 3.4–10.8)

## 2017-10-30 LAB — BASIC METABOLIC PANEL
BUN/Creatinine Ratio: 20 (ref 10–24)
BUN: 17 mg/dL (ref 8–27)
CO2: 22 mmol/L (ref 20–29)
Calcium: 9.2 mg/dL (ref 8.6–10.2)
Chloride: 104 mmol/L (ref 96–106)
Creatinine, Ser: 0.87 mg/dL (ref 0.76–1.27)
GFR calc Af Amer: 96 mL/min/{1.73_m2} (ref >59)
GFR calc non Af Amer: 83 mL/min/{1.73_m2} (ref >59)
Glucose: 102 mg/dL — ABNORMAL HIGH (ref 65–99)
Potassium: 4.2 mmol/L (ref 3.5–5.2)
Sodium: 141 mmol/L (ref 134–144)

## 2017-10-30 LAB — HEPATIC FUNCTION PANEL
ALT: 19 IU/L (ref 0–44)
AST: 15 IU/L (ref 0–40)
Albumin: 4.3 g/dL (ref 3.5–4.8)
Alkaline Phosphatase: 65 IU/L (ref 39–117)
Bilirubin Total: 0.6 mg/dL (ref 0.0–1.2)
Bilirubin, Direct: 0.18 mg/dL (ref 0.00–0.40)
Total Protein: 6.5 g/dL (ref 6.0–8.5)

## 2017-10-30 LAB — TSH: TSH: 1.48 u[IU]/mL (ref 0.450–4.500)

## 2017-10-30 NOTE — Progress Notes (Addendum)
CARDIOLOGY OFFICE NOTE  Date:  10/30/2017    [name] Date of Birth: 21-Feb-1941 Medical Record #[id number]  PCP:  [name], MD  Cardiologist:  Gillian Shields    Chief Complaint  Patient presents with  . Palpitations  . Coronary Artery Disease  . Hypertension  . Hyperlipidemia    7 month check - seen for Dr. Johnsie Cancel    History of Present Illness: [name] is a 76 y.o. male who presents today for a 7 month check. Seen for Dr. Johnsie Cancel.   He has a history of known CAD with prior CABG in 2009. Other issues include HLD, BPH and HTN. He had repeat cath back in 2013 with patent grafts noted. Lesion in a small unbypassed OM to be managed medically.    I saw him back in April - he was doing ok but having more palpitations - mainly at night. Pretty sedentary. No angina. Beta blocker increased cautiously in setting of 1st degree AV block. Follow up EKG ok.   Comes in today. Here alone. He has been to [place] - we had a nice chat about that due to my recent trip there as well. Still with palpitations. Day and night - more pronounced at night. Not dizzy or lightheaded. No syncope. Seems to be more of just a nuisance. He does not use caffeine. No alcohol.  No chest pain. Breathing is ok. Trying to be active.  Tolerating his medicines. Does not check his BP at home.   Past Medical History:  Diagnosis Date  . Abdominal pain, right lateral 02/21/2012   progressive.    . ALLERGIC RHINITIS 07/23/2007  . Allergy   . Arthritis   . Atrial fibrillation ([place]) 07/06/2009  . BACK PAIN 11/02/2009  . BACK PAIN, LUMBAR 10/25/2010  . BENIGN PROSTATIC HYPERTROPHY 07/25/2007  . 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 CAD 01/22/2009  . Cancer ([place])    skin  . 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  . Cataract   . COLONIC POLYPS, HX OF 10/18/2007  . Cough 10/25/2010  . DIZZINESS 01/22/2009  . DYSPNEA ON EXERTION 08/21/2008  . ECHOCARDIOGRAM, ABNORMAL 08/19/2007  . GERD 07/25/2007  .  Heart murmur   . HERNIA 07/06/2009  . HX, PERSONAL, MALIGNANCY, SKIN NEC 10/18/2007  . HYPERGLYCEMIA, BORDERLINE 12/31/2007  . HYPERLIPIDEMIA 07/25/2007  . HYPERTENSION 07/23/2007  . Inguinal hernia   . LUMBAR RADICULOPATHY, RIGHT 10/18/2007  . MUSCLE PAIN 01/22/2009  . Nausea   . NEURITIS 10/25/2010  . Palpitations 05/09/2010  . RHINITIS, CHRONIC 05/11/2010  . RUQ PAIN 01/22/2009  . Sinus polyp   . UNS ADVRS EFF OTH RX MEDICINAL&BIOLOGICAL SBSTNC 04/15/2008  . 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 VITAMIN D DEFICIENCY 11/02/2009  . Voice strain     Past Surgical History:  Procedure Laterality Date  . BACK SURGERY    . basal cell and melanoma removed     face  . BONY PELVIS SURGERY    . CATARACT EXTRACTION     BIL  . COLONOSCOPY    . CORONARY ARTERY BYPASS GRAFT  2009  . HERNIA REPAIR  7 times   inguinal  . LAPAROSCOPIC CHOLECYSTECTOMY  03/03/2013  . LUMBAR SPINE SURGERY    . POLYPECTOMY    . TONSILLECTOMY       Medications: Current Meds  Medication Sig  . aspirin 81 MG tablet Take 81 mg by mouth daily.    Marland Kitchen atorvastatin (LIPITOR) 40 MG tablet Take 1 tablet (  40 mg total) by mouth every other day.  . Cholecalciferol (VITAMIN D3 PO) Take 25 mg daily by mouth.  . clobetasol (OLUX) 0.05 % topical foam APPLY TO AFFECTED AREA TWICE A DAY  . Coenzyme Q10 (COQ-10 PO) Take by mouth.  . losartan (COZAAR) 50 MG tablet TAKE 1 TABLET (50 MG TOTAL) BY MOUTH DAILY.  . magnesium oxide (MAG-OX) 400 MG tablet Take 400 mg daily by mouth.  . metoprolol (LOPRESSOR) 50 MG tablet 25 mg in the AM and 50 mg in the PM  . MULTIPLE VITAMIN PO Take 1 tablet by mouth daily.   Marland Kitchen MYRBETRIQ 25 MG TB24 tablet Take 25 mg by mouth daily.  Marland Kitchen NITROSTAT 0.4 MG SL tablet PLACE 1 TABLET UNDER TONGUE EVERY 5 MINUTES AS NEEDED FOR CHEST PAIN  . Omega-3 Fatty Acids (FISH OIL ADULT GUMMIES PO) Take 1 tablet by mouth daily.  . RABEprazole (ACIPHEX) 20 MG tablet TAKE 1 TABLET BY MOUTH ONCE DAILY  . tamsulosin (FLOMAX) 0.4 MG  CAPS capsule TAKE 1 CAPSULE (0.4 MG TOTAL) BY MOUTH DAILY.   Current Facility-Administered Medications for the 10/30/17 encounter (Office Visit) with Burtis Junes, NP  Medication  . 0.9 %  sodium chloride infusion     Allergies: Allergies  Allergen Reactions  . Propoxyphene N-Acetaminophen Nausea And Vomiting  . Iodinated Diagnostic Agents Swelling    Pt developed slight lt upper lip swelling only to one side about 15 minutes post IV contrast injection. No other symptoms.     Social History: The patient  reports that he quit smoking about 53 years ago. His smoking use included cigarettes. he has never used smokeless tobacco. He reports that he drinks alcohol. He reports that he does not use drugs.   Family History: The patient's family history includes Alcohol abuse in his brother, brother, brother, and father; Breast cancer in his sister; Cancer in his mother and sister; Heart attack in his father; Heart disease in his father; Hypertension in his brother, brother, brother, and father; Kidney disease in his sister; Lung cancer in his mother.   Review of Systems: Please see the history of present illness.   Otherwise, the review of systems is positive for none.   All other systems are reviewed and negative.   Physical Exam: VS:  BP (!) 150/80 (BP Location: Left Arm, Patient Position: Sitting, Cuff Size: Normal)   Pulse 67   Ht 5' 7.5" (1.715 m)   Wt 196 lb (88.9 kg)   BMI 30.24 kg/m  .  BMI Body mass index is 30.24 kg/m.  Wt Readings from Last 3 Encounters:  10/30/17 196 lb (88.9 kg)  04/17/17 189 lb (85.7 kg)  04/10/17 189 lb 12.8 oz (86.1 kg)    General: Pleasant. Elderly male. Alert and in no acute distress.   HEENT: Normal.  Neck: Supple, no JVD, carotid bruits, or masses noted.  Cardiac: Regular rate and rhythm. Soft outflow murmur noted today. No edema.  Respiratory:  Lungs are clear to auscultation bilaterally with normal work of breathing.  GI: Soft and  nontender.  MS: No deformity or atrophy. Gait and ROM intact.  Skin: Warm and dry. Color is normal.  Neuro:  Strength and sensation are intact and no gross focal deficits noted.  Psych: Alert, appropriate and with normal affect.   LABORATORY DATA:  EKG:  EKG is ordered today. This shows NSR with 1st degree AV block  Lab Results  Component Value Date   WBC 7.3 02/20/2017  HGB 15.4 02/20/2017   HCT 44.1 02/20/2017   PLT 208.0 02/20/2017   GLUCOSE 89 02/20/2017   CHOL 148 02/20/2017   TRIG 80.0 02/20/2017   HDL 37.00 (L) 02/20/2017   LDLDIRECT 187.2 03/31/2008   LDLCALC 95 02/20/2017   ALT 22 02/20/2017   AST 16 02/20/2017   NA 139 02/20/2017   K 4.2 02/20/2017   CL 105 02/20/2017   CREATININE 0.66 02/20/2017   BUN 18 02/20/2017   CO2 28 02/20/2017   TSH 1.65 02/09/2016   PSA 1.88 09/03/2012   INR 1.1 (H) 01/25/2012   HGBA1C 5.8 02/20/2017     BNP (last 3 results) No results for input(s): BNP in the last 8760 hours.  ProBNP (last 3 results) No results for input(s): PROBNP in the last 8760 hours.   Other Studies Reviewed Today:   Assessment/Plan:  1. Murmur - will arrange for echocardiogram  2. Continued palpitations - lab today - arrange 48 hour Holter - cautious use of beta blocker due to his AV block. Worry about possible AF. Echo to make sure structurally he is ok.   3. 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   4. CAD - no active symptoms - encouraged continued CV risk factor modification.  5. HTN - recheck by me is 130/80 - no changes made today.   6. HLD - on statin - lab today  Current medicines are reviewed with the patient today.  The patient does not have concerns regarding medicines other than what has been noted above.  The following changes have been made:  See above.  Labs/ tests ordered today include:    Orders Placed This Encounter  Procedures  . Basic metabolic panel  . CBC  . Hepatic function panel  . Lipid panel  .  TSH  . HOLTER MONITOR - 48 HOUR  . EKG 12-Lead  . ECHOCARDIOGRAM COMPLETE     Disposition:   FU with me tentatively in 6 months unless his studies are abnormal.   Patient is agreeable to this plan and will call if any problems develop in the interim.   SignedTruitt Merle, NP  10/30/2017 10:16 AM  [facility] [address] [place], [place]  [address] Phone: [phone] Fax: [phone]       Addendum: 10/31/17 Phone call from Dr. Fredonia Highland - [name] is there seeing him today for back pain. Would like to give him a medrol dose pack and make sure that we would be ok with this recommendation. I have given my ok. Will proceed on with our testing as we previously noted.   Burtis Junes, RN, [facility] [address] [place], [place]  [address] [phone]

## 2017-10-30 NOTE — Patient Instructions (Addendum)
We will be checking the following labs today - BMET, CBC, HPF, Lipids and TSh   Medication Instructions:    Continue with your current medicines.     Testing/Procedures To Be Arranged:  Echocardiogram  48 hour Holter  Follow-Up:   We will see how your tests turn out - tentatively see you in 6 months if all turns out ok.     Other Special Instructions:   N/A    If you need a refill on your cardiac medications before your next appointment, please call your pharmacy.   Call the [place] office at [phone] if you have any questions, problems or concerns.

## 2017-10-31 DIAGNOSIS — M545 Low back pain: Secondary | ICD-10-CM | POA: Diagnosis not present

## 2017-10-31 DIAGNOSIS — M25552 Pain in left hip: Secondary | ICD-10-CM | POA: Diagnosis not present

## 2017-11-06 ENCOUNTER — Other Ambulatory Visit: Payer: Self-pay | Admitting: Internal Medicine

## 2017-11-06 MED ORDER — RABEPRAZOLE SODIUM 20 MG PO TBEC: 20.0000 mg | DELAYED_RELEASE_TABLET | Freq: Every day | ORAL | 1 refills | Status: DC

## 2017-11-12 ENCOUNTER — Ambulatory Visit (INDEPENDENT_AMBULATORY_CARE_PROVIDER_SITE_OTHER): Payer: Medicare Other | Admitting: Family Medicine

## 2017-11-12 ENCOUNTER — Encounter: Payer: Self-pay | Admitting: Family Medicine

## 2017-11-12 VITALS — BP 116/60 | HR 71 | Temp 98.6°F | Ht 66.75 in | Wt 196.1 lb

## 2017-11-12 DIAGNOSIS — H00011 Hordeolum externum right upper eyelid: Secondary | ICD-10-CM

## 2017-11-12 DIAGNOSIS — I259 Chronic ischemic heart disease, unspecified: Secondary | ICD-10-CM | POA: Diagnosis not present

## 2017-11-12 DIAGNOSIS — H1031 Unspecified acute conjunctivitis, right eye: Secondary | ICD-10-CM | POA: Diagnosis not present

## 2017-11-12 MED ORDER — ERYTHROMYCIN 5 MG/GM OP OINT: 1.0000 "application " | TOPICAL_OINTMENT | Freq: Every day | OPHTHALMIC | 0 refills | Status: DC

## 2017-11-12 NOTE — Patient Instructions (Signed)
Apply the eye ointment at least once daily before bedtime.  Compresses with clean warm wash cloth several times daily.  Follow up with eye doctor if worsening or not improving with treatment.

## 2017-11-12 NOTE — Progress Notes (Signed)
HPI:  Acute visit for right eye swelling: -Reports bump on the right upper eyelid for about 5 days, has had some drainage from the right eye for the last day -Denies fevers, malaise, pain in the eye, vision changes or trauma to the eye -He does have a history of "bad allergies" with chronic postnasal drip, sneezing and itchy eyes -has taken Claritin in the past, but currently not taking anything  ROS: See pertinent positives and negatives per HPI.  Past Medical History:  Diagnosis Date  . Abdominal pain, right lateral 02/21/2012   progressive.    . ALLERGIC RHINITIS 07/23/2007  . Allergy   . Arthritis   . Atrial fibrillation ([place]) 07/06/2009  . BACK PAIN 11/02/2009  . BACK PAIN, LUMBAR 10/25/2010  . BENIGN PROSTATIC HYPERTROPHY 07/25/2007  . 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 CAD 01/22/2009  . Cancer ([place])    skin  . 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  . Cataract   . COLONIC POLYPS, HX OF 10/18/2007  . Cough 10/25/2010  . DIZZINESS 01/22/2009  . DYSPNEA ON EXERTION 08/21/2008  . ECHOCARDIOGRAM, ABNORMAL 08/19/2007  . GERD 07/25/2007  . Heart murmur   . HERNIA 07/06/2009  . HX, PERSONAL, MALIGNANCY, SKIN NEC 10/18/2007  . HYPERGLYCEMIA, BORDERLINE 12/31/2007  . HYPERLIPIDEMIA 07/25/2007  . HYPERTENSION 07/23/2007  . Inguinal hernia   . LUMBAR RADICULOPATHY, RIGHT 10/18/2007  . MUSCLE PAIN 01/22/2009  . Nausea   . NEURITIS 10/25/2010  . Palpitations 05/09/2010  . RHINITIS, CHRONIC 05/11/2010  . RUQ PAIN 01/22/2009  . Sinus polyp   . UNS ADVRS EFF OTH RX MEDICINAL&BIOLOGICAL SBSTNC 04/15/2008  . 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 VITAMIN D DEFICIENCY 11/02/2009  . Voice strain     Past Surgical History:  Procedure Laterality Date  . BACK SURGERY    . basal cell and melanoma removed     face  . BONY PELVIS SURGERY    . CATARACT EXTRACTION     BIL  . COLONOSCOPY    . CORONARY ARTERY BYPASS GRAFT  2009  . HERNIA REPAIR  7 times   inguinal  . LAPAROSCOPIC CHOLECYSTECTOMY  03/03/2013  .  LUMBAR SPINE SURGERY    . POLYPECTOMY    . TONSILLECTOMY      Family History  Problem Relation Age of Onset  . Lung cancer Mother   . Cancer Mother   . Heart attack Father   . Hypertension Father   . Alcohol abuse Father   . Heart disease Father   . Breast cancer Sister   . Cancer Sister        breast  . Alcohol abuse Brother   . Hypertension Brother   . Alcohol abuse Brother   . Hypertension Brother   . Alcohol abuse Brother   . Hypertension Brother   . Kidney disease Sister   . Colon cancer Neg Hx   . Esophageal cancer Neg Hx   . Rectal cancer Neg Hx   . Stomach cancer Neg Hx     Social History   Socioeconomic History  . Marital status: Married    Spouse name: None  . Number of children: None  . Years of education: None  . Highest education level: None  Social Needs  . Financial resource strain: None  . Food insecurity - worry: None  . Food insecurity - inability: None  . Transportation needs - medical: None  . Transportation needs - non-medical: None  Occupational History  . None  Tobacco Use  .  Smoking status: Former Smoker    Types: Cigarettes    Last attempt to quit: 04/02/1964    Years since quitting: 53.6  . Smokeless tobacco: Never Used  . Tobacco comment: in early 20's he quit  Substance and Sexual Activity  . Alcohol use: Yes    Comment: socially  . Drug use: No  . Sexual activity: None  Other Topics Concern  . None  Social History Narrative   Retired [employer]   Married   Non smoker some etoh   Travels a lot              Current Outpatient Medications:  .  aspirin 81 MG tablet, Take 81 mg by mouth daily.  , Disp: , Rfl:  .  atorvastatin (LIPITOR) 40 MG tablet, Take 1 tablet (40 mg total) by mouth every other day., Disp: 45 tablet, Rfl: 1 .  Cholecalciferol (VITAMIN D3 PO), Take 25 mg daily by mouth., Disp: , Rfl:  .  clobetasol (OLUX) 0.05 % topical foam, APPLY TO AFFECTED AREA TWICE A DAY, Disp: , Rfl: 10 .  Coenzyme Q10 (COQ-10  PO), Take by mouth., Disp: , Rfl:  .  losartan (COZAAR) 50 MG tablet, TAKE 1 TABLET (50 MG TOTAL) BY MOUTH DAILY., Disp: 90 tablet, Rfl: 2 .  magnesium oxide (MAG-OX) 400 MG tablet, Take 400 mg daily by mouth., Disp: , Rfl:  .  metoprolol (LOPRESSOR) 50 MG tablet, 25 mg in the AM and 50 mg in the PM, Disp: 135 tablet, Rfl: 3 .  MULTIPLE VITAMIN PO, Take 1 tablet by mouth daily. , Disp: , Rfl:  .  MYRBETRIQ 25 MG TB24 tablet, Take 25 mg by mouth daily., Disp: , Rfl: 0 .  NITROSTAT 0.4 MG SL tablet, PLACE 1 TABLET UNDER TONGUE EVERY 5 MINUTES AS NEEDED FOR CHEST PAIN, Disp: 25 tablet, Rfl: 1 .  Omega-3 Fatty Acids (FISH OIL ADULT GUMMIES PO), Take 1 tablet by mouth daily., Disp: , Rfl:  .  RABEprazole (ACIPHEX) 20 MG tablet, Take 1 tablet (20 mg total) daily by mouth., Disp: 90 tablet, Rfl: 1 .  tamsulosin (FLOMAX) 0.4 MG CAPS capsule, TAKE 1 CAPSULE (0.4 MG TOTAL) BY MOUTH DAILY., Disp: 90 capsule, Rfl: 1 .  erythromycin ophthalmic ointment, Place 1 application at bedtime into the right eye. For 5 days., Disp: 3.5 g, Rfl: 0  Current Facility-Administered Medications:  .  0.9 %  sodium chloride infusion, 500 mL, Intravenous, Continuous, Milus Banister, MD  EXAM:  Vitals:   11/12/17 1025  BP: 116/60  Pulse: 71  Temp: 98.6 F (37 C)    Body mass index is 30.94 kg/m.  GENERAL: vitals reviewed and listed above, alert, oriented, appears well hydrated and in no acute distress  HEENT: atraumatic, conjunttiva clear, mild swelling of the right upper eyelid, some discolored discharge from the right eye  -minimal, extraocular muscles intact, visual acuity grossly intact, PERRLA. no obvious abnormalities on inspection of external nose and ears  NECK: no obvious masses on inspection  LUNGS: clear to auscultation bilaterally, no wheezes, rales or rhonchi, good air movement  CV: HRRR, no peripheral edema  MS: moves all extremities without noticeable abnormality  PSYCH: pleasant and  cooperative, no obvious depression or anxiety  ASSESSMENT AND PLAN:  Discussed the following assessment and plan:  Hordeolum externum of right upper eyelid  Acute conjunctivitis of right eye, unspecified acute conjunctivitis type  -Will do eye ointment given the discharge, prescription sent to pharmacy, compresses -  He reports he has a follow-up with his eye doctor soon and will see them if worsening or symptoms do not improve with treatment -Patient advised to return or notify a doctor immediately if symptoms worsen or persist or new concerns arise.  Patient Instructions  Apply the eye ointment at least once daily before bedtime.  Compresses with clean warm wash cloth several times daily.  Follow up with eye doctor if worsening or not improving with treatment.   Colin Benton R., DO

## 2017-11-13 ENCOUNTER — Other Ambulatory Visit: Payer: Self-pay

## 2017-11-13 ENCOUNTER — Ambulatory Visit (HOSPITAL_COMMUNITY): Payer: Medicare Other | Attending: Cardiology

## 2017-11-13 ENCOUNTER — Ambulatory Visit (INDEPENDENT_AMBULATORY_CARE_PROVIDER_SITE_OTHER): Payer: Medicare Other

## 2017-11-13 DIAGNOSIS — I259 Chronic ischemic heart disease, unspecified: Secondary | ICD-10-CM | POA: Diagnosis not present

## 2017-11-13 DIAGNOSIS — R002 Palpitations: Secondary | ICD-10-CM | POA: Diagnosis not present

## 2017-11-13 DIAGNOSIS — E7849 Other hyperlipidemia: Secondary | ICD-10-CM

## 2017-11-13 DIAGNOSIS — I517 Cardiomegaly: Secondary | ICD-10-CM | POA: Diagnosis not present

## 2017-11-13 DIAGNOSIS — I340 Nonrheumatic mitral (valve) insufficiency: Secondary | ICD-10-CM | POA: Diagnosis not present

## 2017-11-13 DIAGNOSIS — R011 Cardiac murmur, unspecified: Secondary | ICD-10-CM

## 2017-11-22 ENCOUNTER — Telehealth: Payer: Self-pay | Admitting: Nurse Practitioner

## 2017-11-22 NOTE — Telephone Encounter (Signed)
Patient aware of monitor results 

## 2017-11-22 NOTE — Telephone Encounter (Signed)
Left message for patient to call back  

## 2017-11-22 NOTE — Telephone Encounter (Signed)
F/u Message  Pt returning Rn call about monitor results. Please call back to discuss

## 2017-11-26 ENCOUNTER — Other Ambulatory Visit: Payer: Self-pay | Admitting: Internal Medicine

## 2017-12-07 DIAGNOSIS — R3915 Urgency of urination: Secondary | ICD-10-CM | POA: Diagnosis not present

## 2017-12-07 DIAGNOSIS — R3912 Poor urinary stream: Secondary | ICD-10-CM | POA: Diagnosis not present

## 2017-12-07 DIAGNOSIS — N401 Enlarged prostate with lower urinary tract symptoms: Secondary | ICD-10-CM | POA: Diagnosis not present

## 2017-12-26 ENCOUNTER — Encounter: Payer: Self-pay | Admitting: Internal Medicine

## 2017-12-26 ENCOUNTER — Ambulatory Visit (INDEPENDENT_AMBULATORY_CARE_PROVIDER_SITE_OTHER): Payer: Medicare Other | Admitting: Internal Medicine

## 2017-12-26 VITALS — BP 124/72 | HR 70 | Temp 98.3°F | Wt 199.4 lb

## 2017-12-26 DIAGNOSIS — Z951 Presence of aortocoronary bypass graft: Secondary | ICD-10-CM

## 2017-12-26 DIAGNOSIS — J069 Acute upper respiratory infection, unspecified: Secondary | ICD-10-CM | POA: Diagnosis not present

## 2017-12-26 NOTE — Patient Instructions (Signed)
I think this is a viral  Respiratory infection  That should resolve on its own however .  If fever  Or  Not a lot better  After the weekend or  Worse   Or serious  .  pain shortness of breath then .    Contact us     mucinex   Dm   Ok    Hot tea and honey is also is ok . For airway .   Calming .   Your chest exam is godd today and no signs of pneumonia .

## 2017-12-26 NOTE — Progress Notes (Signed)
Chief Complaint  Patient presents with  . Cough    Cough and sore throat x 3 days - worsening. Pt having body aches. Runny nose, sinus pressure, PND, eye watering and green mucus. Denies f/c/s. Using Mucinex and Motrin.    HPI: [name] 77 y.o.   SDA  For  Acute problem  Onset dec 30 pm with body aches  Runny nose  congsetion and cough esp at night  With mucous  No blood  But sometimes thick  Headache  Dark green phlegm and taking mucin ex  congestion in chest.   No other sick  At home   Got flu vaccine  Has ha but no sinus pain  Came on fast .    No fever.   Cp or sob     ROS: See pertinent positives and negatives per HPI.  Past Medical History:  Diagnosis Date  . Abdominal pain, right lateral 02/21/2012   progressive.    . ALLERGIC RHINITIS 07/23/2007  . Allergy   . Arthritis   . Atrial fibrillation ([place]) 07/06/2009  . BACK PAIN 11/02/2009  . BACK PAIN, LUMBAR 10/25/2010  . BENIGN PROSTATIC HYPERTROPHY 07/25/2007  . 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 CAD 01/22/2009  . Cancer ([place])    skin  . 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  . Cataract   . COLONIC POLYPS, HX OF 10/18/2007  . Cough 10/25/2010  . DIZZINESS 01/22/2009  . DYSPNEA ON EXERTION 08/21/2008  . ECHOCARDIOGRAM, ABNORMAL 08/19/2007  . GERD 07/25/2007  . Heart murmur   . HERNIA 07/06/2009  . HX, PERSONAL, MALIGNANCY, SKIN NEC 10/18/2007  . HYPERGLYCEMIA, BORDERLINE 12/31/2007  . HYPERLIPIDEMIA 07/25/2007  . HYPERTENSION 07/23/2007  . Inguinal hernia   . LUMBAR RADICULOPATHY, RIGHT 10/18/2007  . MUSCLE PAIN 01/22/2009  . Nausea   . NEURITIS 10/25/2010  . Palpitations 05/09/2010  . RHINITIS, CHRONIC 05/11/2010  . RUQ PAIN 01/22/2009  . Sinus polyp   . UNS ADVRS EFF OTH RX MEDICINAL&BIOLOGICAL SBSTNC 04/15/2008  . 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 VITAMIN D DEFICIENCY 11/02/2009  . Voice strain     Family History  Problem Relation Age of Onset  . Lung cancer Mother   . Cancer Mother   . Heart attack Father   .  Hypertension Father   . Alcohol abuse Father   . Heart disease Father   . Breast cancer Sister   . Cancer Sister        breast  . Alcohol abuse Brother   . Hypertension Brother   . Alcohol abuse Brother   . Hypertension Brother   . Alcohol abuse Brother   . Hypertension Brother   . Kidney disease Sister   . Colon cancer Neg Hx   . Esophageal cancer Neg Hx   . Rectal cancer Neg Hx   . Stomach cancer Neg Hx     Social History   Socioeconomic History  . Marital status: Married    Spouse name: None  . Number of children: None  . Years of education: None  . Highest education level: None  Social Needs  . Financial resource strain: None  . Food insecurity - worry: None  . Food insecurity - inability: None  . Transportation needs - medical: None  . Transportation needs - non-medical: None  Occupational History  . None  Tobacco Use  . Smoking status: Former Smoker    Types: Cigarettes    Last attempt to quit: 04/02/1964    Years since quitting:  53.7  . Smokeless tobacco: Never Used  . Tobacco comment: in early 20's he quit  Substance and Sexual Activity  . Alcohol use: Yes    Comment: socially  . Drug use: No  . Sexual activity: None  Other Topics Concern  . None  Social History Narrative   Retired [employer]   Married   Non smoker some etoh   Travels a lot             Outpatient Medications Prior to Visit  Medication Sig Dispense Refill  . aspirin 81 MG tablet Take 81 mg by mouth daily.      Marland Kitchen atorvastatin (LIPITOR) 40 MG tablet Take 1 tablet (40 mg total) by mouth every other day. 45 tablet 1  . Cholecalciferol (VITAMIN D3 PO) Take 25 mg daily by mouth.    . Coenzyme Q10 (COQ-10 PO) Take by mouth.    . losartan (COZAAR) 50 MG tablet TAKE 1 TABLET (50 MG TOTAL) BY MOUTH DAILY. 90 tablet 2  . magnesium oxide (MAG-OX) 400 MG tablet Take 400 mg daily by mouth.    . metoprolol (LOPRESSOR) 50 MG tablet 25 mg in the AM and 50 mg in the PM 135 tablet 3  . MULTIPLE  VITAMIN PO Take 1 tablet by mouth daily.     Marland Kitchen MYRBETRIQ 25 MG TB24 tablet Take 25 mg by mouth daily.  0  . NITROSTAT 0.4 MG SL tablet PLACE 1 TABLET UNDER TONGUE EVERY 5 MINUTES AS NEEDED FOR CHEST PAIN 25 tablet 1  . Omega-3 Fatty Acids (FISH OIL ADULT GUMMIES PO) Take 1 tablet by mouth daily.    . RABEprazole (ACIPHEX) 20 MG tablet Take 1 tablet (20 mg total) daily by mouth. 90 tablet 1  . tamsulosin (FLOMAX) 0.4 MG CAPS capsule TAKE 1 CAPSULE (0.4 MG TOTAL) BY MOUTH DAILY. 90 capsule 1  . clobetasol (OLUX) 0.05 % topical foam APPLY TO AFFECTED AREA TWICE A DAY  10  . erythromycin ophthalmic ointment Place 1 application at bedtime into the right eye. For 5 days. (Patient not taking: Reported on 12/26/2017) 3.5 g 0  . RABEprazole (ACIPHEX) 20 MG tablet TAKE 1 TABLET BY MOUTH ONCE DAILY (Patient not taking: Reported on 12/26/2017) 90 tablet 0   Facility-Administered Medications Prior to Visit  Medication Dose Route Frequency Provider Last Rate Last Dose  . 0.9 %  sodium chloride infusion  500 mL Intravenous Continuous Milus Banister, MD         EXAM:  BP 124/72 (BP Location: Right Arm, Patient Position: Sitting, Cuff Size: Normal)   Pulse 70   Temp 98.3 F (36.8 C) (Oral)   Wt 199 lb 6.4 oz (90.4 kg)   SpO2 96%   BMI 31.46 kg/m   Body mass index is 31.46 kg/m. WDWN in NAD  quiet respirations; mildly congested  somewhat hoarse. Non toxic . HEENT: Normocephalic ;atraumatic , Eyes;  PERRL, EOMs  Full, lids and conjunctiva clear,,Ears: no deformities, canals nl, TM landmarks normal, Nose: no deformity or discharge but congested;face non  tender Mouth : OP clear without lesion or edema . Neck: Supple without adenopathy or masses or bruits Chest:  Clear to A&P without wheezes rales or rhonchi CV:  S1-S2 no gallops or murmurs peripheral perfusion is normal Skin :nl perfusion and no acute rashes    ASSESSMENT AND PLAN:  Discussed the following assessment and plan:  Acute upper  respiratory infection of multiple sites  S/P CABG (coronary artery bypass  graft) prob viral  paraflu or other seen in community  Exam reassuring and early in illness   Fu contact with alarm features of relapsing sx fever etc .  sxx rx for now and rest .   Declined other cough med   Hx of sinusitis but this seems viral  With onset  Sx  -Patient advised to return or notify health care team  if symptoms worsen ,persist or new concerns arise.  Patient Instructions  I think this is a viral  Respiratory infection  That should resolve on its own however .  If fever  Or  Not a lot better  After the weekend or  Worse   Or serious  .  pain shortness of breath then .    Contact us     mucinex   Dm   Ok    Hot tea and honey is also is ok . For airway .   Calming .   Your chest exam is godd today and no signs of pneumonia .       Standley Brooking. Panosh M.D.

## 2017-12-31 ENCOUNTER — Telehealth: Payer: Self-pay | Admitting: Internal Medicine

## 2017-12-31 MED ORDER — DOXYCYCLINE HYCLATE 100 MG PO TABS: 100.0000 mg | ORAL_TABLET | Freq: Two times a day (BID) | ORAL | 0 refills | Status: DC

## 2017-12-31 NOTE — Telephone Encounter (Signed)
Spoke with pt wife, aware of rec's per Dr Regis Bill, Rx sent to [facility] as requested per patient. Nothing further needed.

## 2017-12-31 NOTE — Telephone Encounter (Signed)
Copied from [place]. Topic: Quick Communication - See Telephone Encounter >> Dec 31, 2017  8:37 AM Oneta Rack wrote: CRM for notification. See Telephone encounter for:   12/31/17.   Caller name: Taha, Dimond Relation to pt: spouse  Call back number: [phone]  [id number] [place] [place] [address] [phone] (Phone) [phone] (Fax)    Reason for call:  Patient last seen 12/26/16 by PCP, spouse states PCP advised if symptoms didn't improve request Rx. Patient experiencing sinus pressure, productive cough, coughing up green thick phlegm

## 2017-12-31 NOTE — Telephone Encounter (Signed)
rx for  Sinusitis   Send in doxycycline 100 mg 1 po bid for 7 days disp #14  Fu if getting wrose instead of better

## 2017-12-31 NOTE — Telephone Encounter (Signed)
Spoke with pt spouse states PCP advised if symptoms didn't improve request Rx.  Patient experiencing sinus pressure, productive cough, coughing up green thick phlegm  Fever over the weekend (100.0), treated with Tylenol. Fever broke last night.  Pt has tried Mucinex, Coricidin and Motrin.  [facility]ing [facility] Pt is staying in bed, does not want to come in for appt.

## 2018-01-14 DIAGNOSIS — R3915 Urgency of urination: Secondary | ICD-10-CM | POA: Diagnosis not present

## 2018-01-14 DIAGNOSIS — R351 Nocturia: Secondary | ICD-10-CM | POA: Diagnosis not present

## 2018-01-14 DIAGNOSIS — N401 Enlarged prostate with lower urinary tract symptoms: Secondary | ICD-10-CM | POA: Diagnosis not present

## 2018-01-21 ENCOUNTER — Other Ambulatory Visit: Payer: Self-pay | Admitting: Internal Medicine

## 2018-01-21 DIAGNOSIS — H524 Presbyopia: Secondary | ICD-10-CM | POA: Diagnosis not present

## 2018-01-21 DIAGNOSIS — H01001 Unspecified blepharitis right upper eyelid: Secondary | ICD-10-CM | POA: Diagnosis not present

## 2018-01-21 DIAGNOSIS — H01004 Unspecified blepharitis left upper eyelid: Secondary | ICD-10-CM | POA: Diagnosis not present

## 2018-01-21 DIAGNOSIS — H04123 Dry eye syndrome of bilateral lacrimal glands: Secondary | ICD-10-CM | POA: Diagnosis not present

## 2018-01-21 DIAGNOSIS — Z961 Presence of intraocular lens: Secondary | ICD-10-CM | POA: Diagnosis not present

## 2018-01-21 DIAGNOSIS — H01002 Unspecified blepharitis right lower eyelid: Secondary | ICD-10-CM | POA: Diagnosis not present

## 2018-01-21 DIAGNOSIS — H35373 Puckering of macula, bilateral: Secondary | ICD-10-CM | POA: Diagnosis not present

## 2018-02-01 ENCOUNTER — Encounter: Payer: Self-pay | Admitting: Family Medicine

## 2018-02-01 ENCOUNTER — Ambulatory Visit (INDEPENDENT_AMBULATORY_CARE_PROVIDER_SITE_OTHER): Payer: Medicare Other | Admitting: Family Medicine

## 2018-02-01 VITALS — BP 130/66 | HR 76 | Temp 98.5°F | Wt 198.6 lb

## 2018-02-01 DIAGNOSIS — H00015 Hordeolum externum left lower eyelid: Secondary | ICD-10-CM

## 2018-02-01 MED ORDER — ERYTHROMYCIN 5 MG/GM OP OINT: 1.0000 "application " | TOPICAL_OINTMENT | Freq: Four times a day (QID) | OPHTHALMIC | 0 refills | Status: DC

## 2018-02-01 NOTE — Progress Notes (Signed)
   Subjective:    Patient ID: [name], male    DOB: 02/21/1941, 77 y.o.   MRN: [id number]  HPI Here for 3 days of swelling and pain in the left lower eyelid. Using hot compresses. Of note he was treated for a stye on the right side last November.    Review of Systems  Constitutional: Negative.   HENT: Negative.   Eyes: Positive for pain. Negative for discharge, redness, itching and visual disturbance.  Respiratory: Negative.        Objective:   Physical Exam  Constitutional: He appears well-developed and well-nourished.  HENT:  Right Ear: External ear normal.  Left Ear: External ear normal.  Nose: Nose normal.  Mouth/Throat: Oropharynx is clear and moist.  Eyes: Conjunctivae and EOM are normal. Pupils are equal, round, and reactive to light.  Left lower eyelid is swollen and has a tender cystic lesion at the lash line   Neck: Neck supple. No thyromegaly present.  Pulmonary/Chest: Effort normal and breath sounds normal. No respiratory distress. He has no wheezes. He has no rales.  Lymphadenopathy:    He has no cervical adenopathy.          Assessment & Plan:  Stye, treat with erythromycin ophthalmic ointment.  Alysia Penna, MD

## 2018-02-23 ENCOUNTER — Other Ambulatory Visit: Payer: Self-pay | Admitting: Internal Medicine

## 2018-03-08 ENCOUNTER — Other Ambulatory Visit: Payer: Self-pay | Admitting: Internal Medicine

## 2018-03-27 ENCOUNTER — Ambulatory Visit (INDEPENDENT_AMBULATORY_CARE_PROVIDER_SITE_OTHER): Payer: Medicare Other | Admitting: Internal Medicine

## 2018-03-27 ENCOUNTER — Telehealth: Payer: Self-pay | Admitting: Family Medicine

## 2018-03-27 ENCOUNTER — Encounter: Payer: Self-pay | Admitting: Internal Medicine

## 2018-03-27 VITALS — BP 118/64 | HR 71 | Temp 97.5°F | Wt 199.8 lb

## 2018-03-27 DIAGNOSIS — I10 Essential (primary) hypertension: Secondary | ICD-10-CM

## 2018-03-27 DIAGNOSIS — R51 Headache: Secondary | ICD-10-CM | POA: Diagnosis not present

## 2018-03-27 DIAGNOSIS — R030 Elevated blood-pressure reading, without diagnosis of hypertension: Secondary | ICD-10-CM

## 2018-03-27 DIAGNOSIS — M545 Low back pain, unspecified: Secondary | ICD-10-CM

## 2018-03-27 DIAGNOSIS — Z79899 Other long term (current) drug therapy: Secondary | ICD-10-CM

## 2018-03-27 DIAGNOSIS — R519 Headache, unspecified: Secondary | ICD-10-CM

## 2018-03-27 NOTE — Progress Notes (Signed)
Chief Complaint  Patient presents with  . Hypertension    Elevated BPs in upper 150ss. Pt c/o pressure in top of head, worse in AM and gets better as the day goes on.     HPI: [name] 77 y.o. come in for sda acute visit   Concern about Bp control   And head pressure this week. monitor  Usually checked and nl 120 - 130  And this week inc to 157/90 and 140/100 range with  Pulse in 80 - 90 range    cardiology team and dr Johnsie Cancel prescribes   Medications  And is on metoprolol  25 am 50 pm and losartan 50  Per dr Johnsie Cancel.   Wife called to day cause of    Elevation and concern  157 in am    For the past  3-4 dayus.   6- 7 hours  inetrrupted .   ROS: See pertinent positives and negatives per HPI. No cp sob had episode this am sever lbp no radiation  advil helped some no  No sinus infection sx   Past Medical History:  Diagnosis Date  . Abdominal pain, right lateral 02/21/2012   progressive.    . ALLERGIC RHINITIS 07/23/2007  . Allergy   . Arthritis   . Atrial fibrillation ([place]) 07/06/2009  . BACK PAIN 11/02/2009  . BACK PAIN, LUMBAR 10/25/2010  . BENIGN PROSTATIC HYPERTROPHY 07/25/2007  . 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 CAD 01/22/2009  . Cancer ([place])    skin  . 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  . Cataract   . COLONIC POLYPS, HX OF 10/18/2007  . Cough 10/25/2010  . DIZZINESS 01/22/2009  . DYSPNEA ON EXERTION 08/21/2008  . ECHOCARDIOGRAM, ABNORMAL 08/19/2007  . GERD 07/25/2007  . Heart murmur   . HERNIA 07/06/2009  . HX, PERSONAL, MALIGNANCY, SKIN NEC 10/18/2007  . HYPERGLYCEMIA, BORDERLINE 12/31/2007  . HYPERLIPIDEMIA 07/25/2007  . HYPERTENSION 07/23/2007  . Inguinal hernia   . LUMBAR RADICULOPATHY, RIGHT 10/18/2007  . MUSCLE PAIN 01/22/2009  . Nausea   . NEURITIS 10/25/2010  . Palpitations 05/09/2010  . RHINITIS, CHRONIC 05/11/2010  . RUQ PAIN 01/22/2009  . Sinus polyp   . UNS ADVRS EFF OTH RX MEDICINAL&BIOLOGICAL SBSTNC 04/15/2008  . 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 Voice strain     Family History  Problem Relation Age of Onset  . Lung cancer Mother   . Cancer Mother   . Heart attack Father   . Hypertension Father   . Alcohol abuse Father   . Heart disease Father   . Breast cancer Sister   . Cancer Sister        breast  . Alcohol abuse Brother   . Hypertension Brother   . Alcohol abuse Brother   . Hypertension Brother   . Alcohol abuse Brother   . Hypertension Brother   . Kidney disease Sister   . Colon cancer Neg Hx   . Esophageal cancer Neg Hx   . Rectal cancer Neg Hx   . Stomach cancer Neg Hx     Social History   Socioeconomic History  . Marital status: Married    Spouse name: Not on file  . Number of children: Not on file  . Years of education: Not on file  . Highest education level: Not on file  Occupational History  . Not on file  Social Needs  . Financial resource strain: Not on file  . Food insecurity:  Worry: Not on file    Inability: Not on file  . Transportation needs:    Medical: Not on file    Non-medical: Not on file  Tobacco Use  . Smoking status: Former Smoker    Types: Cigarettes    Last attempt to quit: 04/02/1964    Years since quitting: 54.0  . Smokeless tobacco: Never Used  . Tobacco comment: in early 20's he quit  Substance and Sexual Activity  . Alcohol use: Yes    Comment: socially  . Drug use: No  . Sexual activity: Not on file  Lifestyle  . Physical activity:    Days per week: Not on file    Minutes per session: Not on file  . Stress: Not on file  Relationships  . Social connections:    Talks on phone: Not on file    Gets together: Not on file    Attends religious service: Not on file    Active member of club or organization: Not on file    Attends meetings of clubs or organizations: Not on file    Relationship status: Not on file  Other Topics Concern  . Not on file  Social History Narrative   Retired [employer]   Married   Non smoker some etoh    Travels a lot             Outpatient Medications Prior to Visit  Medication Sig Dispense Refill  . aspirin 81 MG tablet Take 81 mg by mouth daily.      Marland Kitchen atorvastatin (LIPITOR) 40 MG tablet TAKE 1 TABLET BY MOUTH EVERY OTHER DAY 45 tablet 0  . Cholecalciferol (VITAMIN D3 PO) Take 25 mg daily by mouth.    . clobetasol (OLUX) 0.05 % topical foam APPLY TO AFFECTED AREA TWICE A DAY  10  . Coenzyme Q10 (COQ-10 PO) Take by mouth.    . doxycycline (VIBRA-TABS) 100 MG tablet Take 1 tablet (100 mg total) by mouth 2 (two) times daily. 14 tablet 0  . erythromycin ophthalmic ointment Place 1 application into the left eye 4 (four) times daily. 3.5 g 0  . losartan (COZAAR) 50 MG tablet TAKE 1 TABLET (50 MG TOTAL) BY MOUTH DAILY. 90 tablet 2  . magnesium oxide (MAG-OX) 400 MG tablet Take 400 mg daily by mouth.    . metoprolol (LOPRESSOR) 50 MG tablet 25 mg in the AM and 50 mg in the PM 135 tablet 3  . MULTIPLE VITAMIN PO Take 1 tablet by mouth daily.     Marland Kitchen MYRBETRIQ 25 MG TB24 tablet Take 25 mg by mouth daily.  0  . NITROSTAT 0.4 MG SL tablet PLACE 1 TABLET UNDER TONGUE EVERY 5 MINUTES AS NEEDED FOR CHEST PAIN 25 tablet 1  . Omega-3 Fatty Acids (FISH OIL ADULT GUMMIES PO) Take 1 tablet by mouth daily.    . RABEprazole (ACIPHEX) 20 MG tablet Take 1 tablet (20 mg total) daily by mouth. 90 tablet 1  . RABEprazole (ACIPHEX) 20 MG tablet TAKE 1 TABLET BY MOUTH ONCE DAILY 90 tablet 0  . tamsulosin (FLOMAX) 0.4 MG CAPS capsule TAKE 1 CAPSULE (0.4 MG TOTAL) BY MOUTH DAILY. 90 capsule 0   Facility-Administered Medications Prior to Visit  Medication Dose Route Frequency Provider Last Rate Last Dose  . 0.9 %  sodium chloride infusion  500 mL Intravenous Continuous Milus Banister, MD         EXAM:  BP 118/64 (BP Location: Right Arm, Patient  Position: Sitting, Cuff Size: Normal)   Pulse 71   Temp (!) 97.5 F (36.4 C) (Oral)   Wt 199 lb 12.8 oz (90.6 kg)   BMI 31.53 kg/m   Body mass index is 31.53  kg/m.  GENERAL: vitals reviewed and listed above, alert, oriented, appears well hydrated and in no acute distress HEENT: atraumatic, conjunctiva  clear, no obvious abnormalities on inspection of external nose and ears  Face non tender  NECK: no obvious masses on inspection palpation  LUNGS: clear to auscultation bilaterally, no wheezes, rales or rhonchi, CV: HRRR, no clubbing cyanosis or  peripheral edema nl cap refill  MS: moves all extremities without noticeable focal  Abnormality abd no masses  Back tender lower ls area  Walks gingerly  PSYCH: pleasant and cooperative, no obvious depression or anxiety Lab Results  Component Value Date   WBC 6.8 10/30/2017   HGB 15.3 10/30/2017   HCT 43.4 10/30/2017   PLT 250 10/30/2017   GLUCOSE 102 (H) 10/30/2017   CHOL 152 10/30/2017   TRIG 65 10/30/2017   HDL 45 10/30/2017   LDLDIRECT 187.2 03/31/2008   LDLCALC 94 10/30/2017   ALT 19 10/30/2017   AST 15 10/30/2017   NA 141 10/30/2017   K 4.2 10/30/2017   CL 104 10/30/2017   CREATININE 0.87 10/30/2017   BUN 17 10/30/2017   CO2 22 10/30/2017   TSH 1.480 10/30/2017   PSA 1.88 09/03/2012   INR 1.1 (H) 01/25/2012   HGBA1C 5.8 02/20/2017   BP Readings from Last 3 Encounters:  03/27/18 118/64  02/01/18 130/66  12/26/17 124/72    ASSESSMENT AND PLAN:  Discussed the following assessment and plan:  Essential hypertension  Pressure in head  Medication management  Acute low back pain without sciatica, unspecified back pain laterality  Elevated blood pressure reading  bp ok today   And monitor checked and correlates  Today   No sx of  Sleep apnea    At this time  If take the losartan at night  And if still going back up then  Can increase metoprolol to 50 am 50 pm  Back pain seems mechanical   Tylenol  Caution with advil.  Total visit 34mins > 50% spent counseling and coordinating care as indicated in above note and in instructions to patient . -Patient advised to return or notify  health care team  if  new concerns arise.  Patient Instructions  Take losartan at night  and continue the metoprolol  As  you are doing   1/2 in am and  1 at night . ( 25 am 50 pm)   If bp up still  Up 150 etc and above then increase   metoprolol to   1 in am and  1 in pm ( 50 mg twice a day)     I will send this message to Dr.  Johnsie Cancel .    Send in readings    After a week or so .   Standley Brooking. Panosh M.D.

## 2018-03-27 NOTE — Telephone Encounter (Signed)
I spoke with wife and put patient on schedule for today with Panosh.

## 2018-03-27 NOTE — Telephone Encounter (Signed)
Per Dr Regis Bill add on at 3:00pm today.

## 2018-03-27 NOTE — Patient Instructions (Signed)
Take losartan at night  and continue the metoprolol  As  you are doing   1/2 in am and  1 at night . ( 25 am 50 pm)   If bp up still  Up 150 etc and above then increase   metoprolol to   1 in am and  1 in pm ( 50 mg twice a day)     I will send this message to Dr.  Johnsie Cancel .    Send in readings    After a week or so .

## 2018-03-27 NOTE — Telephone Encounter (Signed)
Copied from [place]. Topic: Appointment Scheduling - Scheduling Inquiry for Clinic >> Mar 27, 2018  9:56 AM Conception Chancy, NT wrote: Patient wife is calling and states the patient BP has been rising the last couple of days. Checked it this morning and it is 154/96. Patient would like to be seen today by Dr. Regis Bill. There is 2 same day appt available that is open but is not letting me schedule there. Martyn Malay stated to send a message and she would talk with Dr. Regis Bill about scheduling in those times. Please contact patient wife back.    Can you ask Dr. Regis Bill about this patient please?

## 2018-04-19 ENCOUNTER — Encounter: Payer: Self-pay | Admitting: Nurse Practitioner

## 2018-04-20 ENCOUNTER — Other Ambulatory Visit: Payer: Self-pay | Admitting: Cardiovascular Disease

## 2018-04-22 ENCOUNTER — Encounter: Payer: Self-pay | Admitting: Family Medicine

## 2018-04-22 ENCOUNTER — Other Ambulatory Visit: Payer: Self-pay | Admitting: Internal Medicine

## 2018-04-22 ENCOUNTER — Ambulatory Visit (INDEPENDENT_AMBULATORY_CARE_PROVIDER_SITE_OTHER): Payer: Medicare Other | Admitting: Family Medicine

## 2018-04-22 VITALS — BP 112/64 | HR 70 | Temp 98.0°F | Wt 200.0 lb

## 2018-04-22 DIAGNOSIS — H1013 Acute atopic conjunctivitis, bilateral: Secondary | ICD-10-CM

## 2018-04-22 NOTE — Progress Notes (Signed)
[name] is a 77 -year-old married male nonsmoker who comes in today for evaluation of swelling and itching of his eyes  This problem started about 2 weeks ago. He has a history of allergic rhinitis.  He was treated here in February for a stye in his right eye. However this time he has no swelling just the intense itching  BP 112/64 (BP Location: Left Arm, Patient Position: Sitting, Cuff Size: Normal)   Pulse 70   Temp 98 F (36.7 C) (Oral)   Wt 200 lb (90.7 kg)   SpO2 95%   BMI 31.56 kg/m  Well-developed well-nourished male no acute distress vital signs stable he's afebrile examination the eyes appears normal except for some mild erythema on both eyes. No discharge  #1 allergic rhinitis with secondary involvement.........Marland Kitchen plain Zyrtec daily at bedtime along with clear eyedrops 4 times a day

## 2018-04-22 NOTE — Patient Instructions (Signed)
Zyrtec plain......... one daily at bedtime  OTC. Clear eyedrops........ one drop each eye 4 times a day when necessary for itching

## 2018-04-23 ENCOUNTER — Other Ambulatory Visit: Payer: Self-pay | Admitting: Physician Assistant

## 2018-04-23 DIAGNOSIS — L570 Actinic keratosis: Secondary | ICD-10-CM | POA: Diagnosis not present

## 2018-04-23 DIAGNOSIS — C4491 Basal cell carcinoma of skin, unspecified: Secondary | ICD-10-CM

## 2018-04-23 DIAGNOSIS — C44619 Basal cell carcinoma of skin of left upper limb, including shoulder: Secondary | ICD-10-CM | POA: Diagnosis not present

## 2018-04-23 HISTORY — DX: Basal cell carcinoma of skin, unspecified: C44.91

## 2018-04-28 DIAGNOSIS — R079 Chest pain, unspecified: Secondary | ICD-10-CM | POA: Diagnosis not present

## 2018-04-28 DIAGNOSIS — N189 Chronic kidney disease, unspecified: Secondary | ICD-10-CM | POA: Diagnosis not present

## 2018-04-28 DIAGNOSIS — I129 Hypertensive chronic kidney disease with stage 1 through stage 4 chronic kidney disease, or unspecified chronic kidney disease: Secondary | ICD-10-CM | POA: Diagnosis not present

## 2018-04-28 DIAGNOSIS — R42 Dizziness and giddiness: Secondary | ICD-10-CM | POA: Diagnosis not present

## 2018-04-28 DIAGNOSIS — R200 Anesthesia of skin: Secondary | ICD-10-CM | POA: Diagnosis not present

## 2018-05-07 ENCOUNTER — Ambulatory Visit: Payer: Medicare Other | Admitting: Nurse Practitioner

## 2018-05-07 DIAGNOSIS — R42 Dizziness and giddiness: Secondary | ICD-10-CM | POA: Diagnosis not present

## 2018-05-07 DIAGNOSIS — H903 Sensorineural hearing loss, bilateral: Secondary | ICD-10-CM | POA: Diagnosis not present

## 2018-05-08 ENCOUNTER — Other Ambulatory Visit: Payer: Self-pay | Admitting: Nurse Practitioner

## 2018-05-09 DIAGNOSIS — M545 Low back pain: Secondary | ICD-10-CM | POA: Diagnosis not present

## 2018-05-09 DIAGNOSIS — M4727 Other spondylosis with radiculopathy, lumbosacral region: Secondary | ICD-10-CM | POA: Diagnosis not present

## 2018-05-10 DIAGNOSIS — Z8679 Personal history of other diseases of the circulatory system: Secondary | ICD-10-CM | POA: Diagnosis not present

## 2018-05-10 DIAGNOSIS — R42 Dizziness and giddiness: Secondary | ICD-10-CM | POA: Diagnosis not present

## 2018-05-10 DIAGNOSIS — Z853 Personal history of malignant neoplasm of breast: Secondary | ICD-10-CM | POA: Diagnosis not present

## 2018-05-10 DIAGNOSIS — R51 Headache: Secondary | ICD-10-CM | POA: Diagnosis not present

## 2018-05-21 DIAGNOSIS — R42 Dizziness and giddiness: Secondary | ICD-10-CM | POA: Diagnosis not present

## 2018-05-21 DIAGNOSIS — H903 Sensorineural hearing loss, bilateral: Secondary | ICD-10-CM | POA: Diagnosis not present

## 2018-05-27 DIAGNOSIS — M4727 Other spondylosis with radiculopathy, lumbosacral region: Secondary | ICD-10-CM | POA: Diagnosis not present

## 2018-05-27 DIAGNOSIS — M5116 Intervertebral disc disorders with radiculopathy, lumbar region: Secondary | ICD-10-CM | POA: Diagnosis not present

## 2018-05-27 DIAGNOSIS — M419 Scoliosis, unspecified: Secondary | ICD-10-CM | POA: Diagnosis not present

## 2018-05-27 DIAGNOSIS — M4726 Other spondylosis with radiculopathy, lumbar region: Secondary | ICD-10-CM | POA: Diagnosis not present

## 2018-05-28 DIAGNOSIS — M5126 Other intervertebral disc displacement, lumbar region: Secondary | ICD-10-CM | POA: Diagnosis not present

## 2018-05-28 DIAGNOSIS — G039 Meningitis, unspecified: Secondary | ICD-10-CM | POA: Diagnosis not present

## 2018-05-28 DIAGNOSIS — R42 Dizziness and giddiness: Secondary | ICD-10-CM | POA: Diagnosis not present

## 2018-05-28 DIAGNOSIS — M5136 Other intervertebral disc degeneration, lumbar region: Secondary | ICD-10-CM | POA: Diagnosis not present

## 2018-05-28 DIAGNOSIS — M47817 Spondylosis without myelopathy or radiculopathy, lumbosacral region: Secondary | ICD-10-CM | POA: Diagnosis not present

## 2018-05-28 DIAGNOSIS — M545 Low back pain: Secondary | ICD-10-CM | POA: Diagnosis not present

## 2018-05-29 ENCOUNTER — Other Ambulatory Visit: Payer: Self-pay | Admitting: Internal Medicine

## 2018-05-31 DIAGNOSIS — M545 Low back pain: Secondary | ICD-10-CM | POA: Diagnosis not present

## 2018-05-31 DIAGNOSIS — R42 Dizziness and giddiness: Secondary | ICD-10-CM | POA: Diagnosis not present

## 2018-05-31 DIAGNOSIS — M5126 Other intervertebral disc displacement, lumbar region: Secondary | ICD-10-CM | POA: Diagnosis not present

## 2018-06-03 ENCOUNTER — Other Ambulatory Visit: Payer: Self-pay | Admitting: Internal Medicine

## 2018-06-04 DIAGNOSIS — R42 Dizziness and giddiness: Secondary | ICD-10-CM | POA: Diagnosis not present

## 2018-06-04 DIAGNOSIS — M545 Low back pain: Secondary | ICD-10-CM | POA: Diagnosis not present

## 2018-06-04 DIAGNOSIS — M5126 Other intervertebral disc displacement, lumbar region: Secondary | ICD-10-CM | POA: Diagnosis not present

## 2018-06-05 NOTE — Telephone Encounter (Signed)
[facility] stating that [facility]'t received iRABEprazole (ACIPHEX) 20 MG tablets medicine [facility] [place]

## 2018-06-06 DIAGNOSIS — R42 Dizziness and giddiness: Secondary | ICD-10-CM | POA: Diagnosis not present

## 2018-06-06 DIAGNOSIS — M5126 Other intervertebral disc displacement, lumbar region: Secondary | ICD-10-CM | POA: Diagnosis not present

## 2018-06-06 DIAGNOSIS — M545 Low back pain: Secondary | ICD-10-CM | POA: Diagnosis not present

## 2018-06-11 DIAGNOSIS — M545 Low back pain: Secondary | ICD-10-CM | POA: Diagnosis not present

## 2018-06-11 DIAGNOSIS — R42 Dizziness and giddiness: Secondary | ICD-10-CM | POA: Diagnosis not present

## 2018-06-11 DIAGNOSIS — M5126 Other intervertebral disc displacement, lumbar region: Secondary | ICD-10-CM | POA: Diagnosis not present

## 2018-06-13 DIAGNOSIS — M545 Low back pain: Secondary | ICD-10-CM | POA: Diagnosis not present

## 2018-06-13 DIAGNOSIS — M5126 Other intervertebral disc displacement, lumbar region: Secondary | ICD-10-CM | POA: Diagnosis not present

## 2018-06-13 DIAGNOSIS — R42 Dizziness and giddiness: Secondary | ICD-10-CM | POA: Diagnosis not present

## 2018-06-20 DIAGNOSIS — M5126 Other intervertebral disc displacement, lumbar region: Secondary | ICD-10-CM | POA: Diagnosis not present

## 2018-06-20 DIAGNOSIS — M545 Low back pain: Secondary | ICD-10-CM | POA: Diagnosis not present

## 2018-06-20 DIAGNOSIS — R42 Dizziness and giddiness: Secondary | ICD-10-CM | POA: Diagnosis not present

## 2018-06-26 DIAGNOSIS — R42 Dizziness and giddiness: Secondary | ICD-10-CM | POA: Diagnosis not present

## 2018-06-26 DIAGNOSIS — M5126 Other intervertebral disc displacement, lumbar region: Secondary | ICD-10-CM | POA: Diagnosis not present

## 2018-06-26 DIAGNOSIS — M545 Low back pain: Secondary | ICD-10-CM | POA: Diagnosis not present

## 2018-07-02 DIAGNOSIS — M5126 Other intervertebral disc displacement, lumbar region: Secondary | ICD-10-CM | POA: Diagnosis not present

## 2018-07-02 DIAGNOSIS — R42 Dizziness and giddiness: Secondary | ICD-10-CM | POA: Diagnosis not present

## 2018-07-02 DIAGNOSIS — M545 Low back pain: Secondary | ICD-10-CM | POA: Diagnosis not present

## 2018-07-16 ENCOUNTER — Ambulatory Visit: Payer: Medicare Other | Admitting: Nurse Practitioner

## 2018-07-17 ENCOUNTER — Telehealth: Payer: Self-pay | Admitting: Internal Medicine

## 2018-07-17 NOTE — Telephone Encounter (Signed)
Copied from [place] [id number]. Topic: Quick Communication - See Telephone Encounter >> Jul 17, 2018  1:31 PM Vernona Rieger wrote: CRM for notification. See Telephone encounter for: 07/17/18. Patient's wife called and said they pharmacy told her that if they were over the age of 82 that he could have a shingles vaccine. She would like to know should he go ahead and do that or has he already had one in the past?

## 2018-07-17 NOTE — Telephone Encounter (Signed)
Yes would get the updated  Newer shingles vaccine    . Better immunity . At your convenience

## 2018-07-17 NOTE — Telephone Encounter (Signed)
Please advise Dr Panosh, thanks.   

## 2018-07-18 NOTE — Telephone Encounter (Signed)
Pt aware that he can get his Shingles vax whenever ready -- Shingrix Pt states that he will get this when he gets home from [place]

## 2018-08-14 ENCOUNTER — Ambulatory Visit (INDEPENDENT_AMBULATORY_CARE_PROVIDER_SITE_OTHER): Payer: Medicare Other | Admitting: Nurse Practitioner

## 2018-08-14 ENCOUNTER — Encounter: Payer: Self-pay | Admitting: Nurse Practitioner

## 2018-08-14 VITALS — BP 118/66 | HR 67 | Ht 67.5 in | Wt 199.0 lb

## 2018-08-14 DIAGNOSIS — I259 Chronic ischemic heart disease, unspecified: Secondary | ICD-10-CM

## 2018-08-14 DIAGNOSIS — R002 Palpitations: Secondary | ICD-10-CM

## 2018-08-14 DIAGNOSIS — R011 Cardiac murmur, unspecified: Secondary | ICD-10-CM

## 2018-08-14 DIAGNOSIS — E7849 Other hyperlipidemia: Secondary | ICD-10-CM | POA: Diagnosis not present

## 2018-08-14 LAB — HEPATIC FUNCTION PANEL
ALT: 20 IU/L (ref 0–44)
AST: 16 IU/L (ref 0–40)
Albumin: 4.4 g/dL (ref 3.5–4.8)
Alkaline Phosphatase: 64 IU/L (ref 39–117)
Bilirubin Total: 0.7 mg/dL (ref 0.0–1.2)
Bilirubin, Direct: 0.17 mg/dL (ref 0.00–0.40)
Total Protein: 6.4 g/dL (ref 6.0–8.5)

## 2018-08-14 LAB — BASIC METABOLIC PANEL
BUN/Creatinine Ratio: 16 (ref 10–24)
BUN: 13 mg/dL (ref 8–27)
CO2: 22 mmol/L (ref 20–29)
Calcium: 9.4 mg/dL (ref 8.6–10.2)
Chloride: 103 mmol/L (ref 96–106)
Creatinine, Ser: 0.79 mg/dL (ref 0.76–1.27)
GFR calc Af Amer: 99 mL/min/{1.73_m2} (ref >59)
GFR calc non Af Amer: 86 mL/min/{1.73_m2} (ref >59)
Glucose: 106 mg/dL — ABNORMAL HIGH (ref 65–99)
Potassium: 4.4 mmol/L (ref 3.5–5.2)
Sodium: 141 mmol/L (ref 134–144)

## 2018-08-14 LAB — LIPID PANEL
Chol/HDL Ratio: 4.4 ratio (ref 0.0–5.0)
Cholesterol, Total: 193 mg/dL (ref 100–199)
HDL: 44 mg/dL (ref >39)
LDL Calculated: 130 mg/dL — ABNORMAL HIGH (ref 0–99)
Triglycerides: 95 mg/dL (ref 0–149)
VLDL Cholesterol Cal: 19 mg/dL (ref 5–40)

## 2018-08-14 LAB — CBC
Hematocrit: 45.6 % (ref 37.5–51.0)
Hemoglobin: 15.9 g/dL (ref 13.0–17.7)
MCH: 33 pg (ref 26.6–33.0)
MCHC: 34.9 g/dL (ref 31.5–35.7)
MCV: 95 fL (ref 79–97)
Platelets: 208 10*3/uL (ref 150–450)
RBC: 4.82 x10E6/uL (ref 4.14–5.80)
RDW: 13.6 % (ref 12.3–15.4)
WBC: 6.3 10*3/uL (ref 3.4–10.8)

## 2018-08-14 NOTE — Patient Instructions (Addendum)
We will be checking the following labs today - BMET, CBC, HPF, Lipids   Medication Instructions:    Continue with your current medicines. BUT  I would prefer that you NOT take the Gabapentin, Relafen  Will leave you off the Lipitor    Testing/Procedures To Be Arranged:  N/A  Follow-Up:   See me in 6 months with fasting labs    Other Special Instructions:   N/A    If you need a refill on your cardiac medications before your next appointment, please call your pharmacy.   Call the [place] office at [phone] if you have any questions, problems or concerns.

## 2018-08-14 NOTE — Progress Notes (Signed)
CARDIOLOGY OFFICE NOTE  Date:  08/14/2018    [name] Date of Birth: 1941-12-16 Medical Record #[id number]  PCP:  [name], MD  Cardiologist:  Gillian Shields    Chief Complaint  Patient presents with  . Coronary Artery Disease  . Palpitations  . Irregular Heart Beat    9 month check - seen for Dr. Johnsie Cancel    History of Present Illness: [name] is a 77 y.o. male who presents today for a 9 month check. Seen for Dr. Johnsie Cancel.   He has a history of known CAD with priorCABG in 2009. Other issues include HLD, BPH and HTN. He had repeat cath back in 2013 with patent grafts noted. Lesion in a small unbypassed OM to be managed medically.  I have seen him the past several visits. He has had more in the way of palpitations. Cautious increase in beta blocker due to 1st degree AV block. Echo updated last November of 2018 as well as a Holter - these studies were stable.   Comes in today. [place]. He has been to [place] - he was there for 3 months - notes that on the way there he got dizzy and had pressure in his head - had EKG and CT in [place] - told he had vertigo - then sent to ENT - had MRI - still with diagnosis of vertigo - he went to PT with some improvement. He saw a neurosurgeon for his back pain while there - found to have spinal stenosis - advised physical therapy and had some acupuncture. He was using Gabapentin and Relafen "as needed" for back pain but this made him dizzy. No chest pain. Breathing is ok. He has started on some Omega 3 that has Red Yeast Rice - this has helped his joints. Fasting today. Some balance issues - not using a cane. No falls. Some occasional palpitations - but not bothersome for him.   Past Medical History:  Diagnosis Date  . Abdominal pain, right lateral 02/21/2012   progressive.    . ALLERGIC RHINITIS 07/23/2007  . Allergy   . Arthritis   . Atrial fibrillation ([place]) 07/06/2009  . BACK PAIN 11/02/2009  . BACK PAIN,  LUMBAR 10/25/2010  . BENIGN PROSTATIC HYPERTROPHY 07/25/2007  . 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 CAD 01/22/2009  . Cancer ([place])    skin  . 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  . Cataract   . COLONIC POLYPS, HX OF 10/18/2007  . Cough 10/25/2010  . DIZZINESS 01/22/2009  . DYSPNEA ON EXERTION 08/21/2008  . ECHOCARDIOGRAM, ABNORMAL 08/19/2007  . GERD 07/25/2007  . Heart murmur   . HERNIA 07/06/2009  . HX, PERSONAL, MALIGNANCY, SKIN NEC 10/18/2007  . HYPERGLYCEMIA, BORDERLINE 12/31/2007  . HYPERLIPIDEMIA 07/25/2007  . HYPERTENSION 07/23/2007  . Inguinal hernia   . LUMBAR RADICULOPATHY, RIGHT 10/18/2007  . MUSCLE PAIN 01/22/2009  . Nausea   . NEURITIS 10/25/2010  . Palpitations 05/09/2010  . RHINITIS, CHRONIC 05/11/2010  . RUQ PAIN 01/22/2009  . Sinus polyp   . UNS ADVRS EFF OTH RX MEDICINAL&BIOLOGICAL SBSTNC 04/15/2008  . 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 VITAMIN D DEFICIENCY 11/02/2009  . Voice strain     Past Surgical History:  Procedure Laterality Date  . BACK SURGERY    . basal cell and melanoma removed     face  . BONY PELVIS SURGERY    . CATARACT EXTRACTION     BIL  . COLONOSCOPY    .  CORONARY ARTERY BYPASS GRAFT  2009  . HERNIA REPAIR  7 times   inguinal  . LAPAROSCOPIC CHOLECYSTECTOMY  03/03/2013  . LUMBAR SPINE SURGERY    . POLYPECTOMY    . TONSILLECTOMY       Medications: Current Meds  Medication Sig  . aspirin 81 MG tablet Take 81 mg by mouth daily.    . Cholecalciferol (VITAMIN D3 PO) Take 25 mg daily by mouth.  . clobetasol (OLUX) 0.05 % topical foam APPLY TO AFFECTED AREA TWICE A DAY  . DHA-EPA-Vitamin E (OMEGA-3 COMPLEX PO) Take 1,200 mg by mouth 2 (two) times daily. ProOmega LDL  . losartan (COZAAR) 50 MG tablet TAKE 1 TABLET (50 MG TOTAL) BY MOUTH DAILY.  . magnesium oxide (MAG-OX) 400 MG tablet Take 400 mg daily by mouth.  . meclizine (ANTIVERT) 25 MG tablet Take 25 mg by mouth 3 (three) times daily as needed for dizziness or nausea.   . metoprolol tartrate  (LOPRESSOR) 50 MG tablet TAKE 1/2 TABLET BY MOUTH IN THE MORNING AND 1 TABLET BY MOUTH IN THE EVENIG  . MULTIPLE VITAMIN PO Take 1 tablet by mouth daily.   Marland Kitchen MYRBETRIQ 25 MG TB24 tablet Take 25 mg by mouth daily.  Marland Kitchen NITROSTAT 0.4 MG SL tablet PLACE 1 TABLET UNDER TONGUE EVERY 5 MINUTES AS NEEDED FOR CHEST PAIN  . Omega-3 Fatty Acids (FISH OIL ADULT GUMMIES PO) Take 1 tablet by mouth daily.  . RABEprazole (ACIPHEX) 20 MG tablet TAKE 1 TABLET BY MOUTH ONCE DAILY  . tamsulosin (FLOMAX) 0.4 MG CAPS capsule TAKE 1 CAPSULE (0.4 MG TOTAL) BY MOUTH DAILY.  . [DISCONTINUED] atorvastatin (LIPITOR) 40 MG tablet TAKE 1 TABLET BY MOUTH EVERY OTHER DAY  . [DISCONTINUED] doxycycline (VIBRA-TABS) 100 MG tablet Take 1 tablet (100 mg total) by mouth 2 (two) times daily.  . [DISCONTINUED] erythromycin ophthalmic ointment Place 1 application into the left eye 4 (four) times daily.  . [DISCONTINUED] gabapentin (NEURONTIN) 300 MG capsule Take 300 mg by mouth as needed (back pain).  . [DISCONTINUED] nabumetone (RELAFEN) 500 MG tablet Take 500 mg by mouth 2 (two) times daily as needed for mild pain (back).    Current Facility-Administered Medications for the 08/14/18 encounter (Office Visit) with Burtis Junes, NP  Medication  . 0.9 %  sodium chloride infusion     Allergies: Allergies  Allergen Reactions  . Propoxyphene N-Acetaminophen Nausea And Vomiting  . Iodinated Diagnostic Agents Swelling    Pt developed slight lt upper lip swelling only to one side about 15 minutes post IV contrast injection. No other symptoms.     Social History: The patient  reports that he quit smoking about 54 years ago. His smoking use included cigarettes. He has never used smokeless tobacco. He reports that he drinks alcohol. He reports that he does not use drugs.   Family History: The patient's family history includes Alcohol abuse in his brother, brother, brother, and father; Breast cancer in his sister; Cancer in his mother  and sister; Heart attack in his father; Heart disease in his father; Hypertension in his brother, brother, brother, and father; Kidney disease in his sister; Lung cancer in his mother.   Review of Systems: Please see the history of present illness.   Otherwise, the review of systems is positive for none.   All other systems are reviewed and negative.   Physical Exam: VS:  BP 118/66 (BP Location: Left Arm, Patient Position: Sitting, Cuff Size: Normal)   Pulse 67  Ht 5' 7.5" (1.715 m)   Wt 199 lb (90.3 kg)   SpO2 92% Comment: at rest  BMI 30.71 kg/m  .  BMI Body mass index is 30.71 kg/m.  Wt Readings from Last 3 Encounters:  08/14/18 199 lb (90.3 kg)  04/22/18 200 lb (90.7 kg)  03/27/18 199 lb 12.8 oz (90.6 kg)    General: Pleasant. Elderly male. Alert and in no acute distress.   HEENT: Normal.  Neck: Supple, no JVD, carotid bruits, or masses noted.  Cardiac: Regular rate and rhythm.Soft outflow murmur. No edema.  Respiratory:  Lungs are clear to auscultation bilaterally with normal work of breathing.  GI: Soft and nontender.  MS: No deformity or atrophy. Gait and ROM intact. Kyphotic.  Skin: Warm and dry. Color is normal.  Neuro:  Strength and sensation are intact and no gross focal deficits noted.  Psych: Alert, appropriate and with normal affect.   LABORATORY DATA:  EKG:  EKG is not ordered today.  Lab Results  Component Value Date   WBC 6.8 10/30/2017   HGB 15.3 10/30/2017   HCT 43.4 10/30/2017   PLT 250 10/30/2017   GLUCOSE 102 (H) 10/30/2017   CHOL 152 10/30/2017   TRIG 65 10/30/2017   HDL 45 10/30/2017   LDLDIRECT 187.2 03/31/2008   LDLCALC 94 10/30/2017   ALT 19 10/30/2017   AST 15 10/30/2017   NA 141 10/30/2017   K 4.2 10/30/2017   CL 104 10/30/2017   CREATININE 0.87 10/30/2017   BUN 17 10/30/2017   CO2 22 10/30/2017   TSH 1.480 10/30/2017   PSA 1.88 09/03/2012   INR 1.1 (H) 01/25/2012   HGBA1C 5.8 02/20/2017     BNP (last 3 results) No results  for input(s): BNP in the last 8760 hours.  ProBNP (last 3 results) No results for input(s): PROBNP in the last 8760 hours.   Other Studies Reviewed Today:  Holter Study Highlights 10/2017  NSR PAC;s / PVC;s No significant arrhythmias PVC;s account for less than 1% total beats    Echo Study Conclusions 10/2017  - Left ventricle: The cavity size was normal. There was moderate   concentric hypertrophy. Systolic function was normal. The   estimated ejection fraction was in the range of 60% to 65%. Wall   motion was normal; there were no regional wall motion   abnormalities. Doppler parameters are consistent with abnormal   left ventricular relaxation (grade 1 diastolic dysfunction).   Doppler parameters are consistent with elevated ventricular   end-diastolic filling pressure. - Aortic valve: Trileaflet; normal thickness leaflets. There was no   regurgitation. - Aortic root: The aortic root was normal in size. - Mitral valve: There was mild regurgitation. - Left atrium: The atrium was moderately dilated. - Right ventricle: Systolic function was normal. - Right atrium: The atrium was normal in size. - Tricuspid valve: There was trivial regurgitation. - Pulmonary arteries: Systolic pressure was within the normal   range. - Inferior vena cava: The vessel was normal in size. The   respirophasic diameter changes were in the normal range (= 50%),   consistent with normal central venous pressure. - Pericardium, extracardiac: There was no pericardial effusion.   Assessment/Plan:  1. Ischemic heart disease - remote CABG - no active chest pain - would favor continuing his current regimen.   2. Murmur - echo from November 2018 noted.   3. Palpitations - not too bothersome - his Holter was stable.   4. 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   5. HTN - his BP is great. No changes made.   6. HLD - no longer on his statin - he is using some Red Yeast Rice - checking lab today.    Current medicines are reviewed with the patient today.  The patient does not have concerns regarding medicines other than what has been noted above.  The following changes have been made:  See above.  Labs/ tests ordered today include:    Orders Placed This Encounter  Procedures  . Basic metabolic panel  . CBC  . Hepatic function panel  . Lipid panel     Disposition:   FU with me in 6 months.   Patient is agreeable to this plan and will call if any problems develop in the interim.   SignedTruitt Merle, NP  08/14/2018 10:09 AM  [facility] [address] [place], [place]  [address] Phone: [phone] Fax: [phone]

## 2018-08-23 ENCOUNTER — Other Ambulatory Visit: Payer: Self-pay | Admitting: Internal Medicine

## 2018-09-10 ENCOUNTER — Ambulatory Visit (INDEPENDENT_AMBULATORY_CARE_PROVIDER_SITE_OTHER): Payer: Medicare Other

## 2018-09-10 DIAGNOSIS — Z23 Encounter for immunization: Secondary | ICD-10-CM | POA: Diagnosis not present

## 2018-10-02 ENCOUNTER — Encounter: Payer: Self-pay | Admitting: *Deleted

## 2018-10-14 NOTE — Progress Notes (Signed)
Chief Complaint  Patient presents with  . Cyst    right glute, pt states it started hurting  2 weeks ago and "opened up". stated there is drainage that he belives is pus, tender to the touch    HPI: [name] 77 y.o. come in for  Acute problem  Cyst ion right backside  That is now infected larger and draining   Has squeezed some out and now blood no fever  Using antibiotic topical  Draining for 2 weeks  And continuing .  And bled some after squeezing.      ROS: See pertinent positives and negatives per HPI.  Past Medical History:  Diagnosis Date  . Abdominal pain, right lateral 02/21/2012   progressive.    . ALLERGIC RHINITIS 07/23/2007  . Allergy   . Arthritis   . Atrial fibrillation ([place]) 07/06/2009  . BACK PAIN 11/02/2009  . BACK PAIN, LUMBAR 10/25/2010  . BENIGN PROSTATIC HYPERTROPHY 07/25/2007  . 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 CAD 01/22/2009  . Cancer ([place])    skin  . 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  . Cataract   . COLONIC POLYPS, HX OF 10/18/2007  . Cough 10/25/2010  . DIZZINESS 01/22/2009  . DYSPNEA ON EXERTION 08/21/2008  . ECHOCARDIOGRAM, ABNORMAL 08/19/2007  . GERD 07/25/2007  . Heart murmur   . HERNIA 07/06/2009  . HX, PERSONAL, MALIGNANCY, SKIN NEC 10/18/2007  . HYPERGLYCEMIA, BORDERLINE 12/31/2007  . HYPERLIPIDEMIA 07/25/2007  . HYPERTENSION 07/23/2007  . Inguinal hernia   . LUMBAR RADICULOPATHY, RIGHT 10/18/2007  . MUSCLE PAIN 01/22/2009  . Nausea   . NEURITIS 10/25/2010  . Palpitations 05/09/2010  . RHINITIS, CHRONIC 05/11/2010  . RUQ PAIN 01/22/2009  . Sinus polyp   . UNS ADVRS EFF OTH RX MEDICINAL&BIOLOGICAL SBSTNC 04/15/2008  . 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 VITAMIN D DEFICIENCY 11/02/2009  . Voice strain     Family History  Problem Relation Age of Onset  . Lung cancer Mother   . Cancer Mother   . Heart attack Father   . Hypertension Father   . Alcohol abuse Father   . Heart disease Father   . Breast cancer Sister   . Cancer Sister    breast  . Alcohol abuse Brother   . Hypertension Brother   . Alcohol abuse Brother   . Hypertension Brother   . Alcohol abuse Brother   . Hypertension Brother   . Kidney disease Sister   . Colon cancer Neg Hx   . Esophageal cancer Neg Hx   . Rectal cancer Neg Hx   . Stomach cancer Neg Hx     Social History   Socioeconomic History  . Marital status: Married    Spouse name: Not on file  . Number of children: Not on file  . Years of education: Not on file  . Highest education level: Not on file  Occupational History  . Not on file  Social Needs  . Financial resource strain: Not on file  . Food insecurity:    Worry: Not on file    Inability: Not on file  . Transportation needs:    Medical: Not on file    Non-medical: Not on file  Tobacco Use  . Smoking status: Former Smoker    Types: Cigarettes    Last attempt to quit: 04/02/1964    Years since quitting: 54.5  . Smokeless tobacco: Never Used  . Tobacco comment: in early 20's he quit  Substance and Sexual Activity  .  Alcohol use: Yes    Comment: socially  . Drug use: No  . Sexual activity: Not on file  Lifestyle  . Physical activity:    Days per week: Not on file    Minutes per session: Not on file  . Stress: Not on file  Relationships  . Social connections:    Talks on phone: Not on file    Gets together: Not on file    Attends religious service: Not on file    Active member of club or organization: Not on file    Attends meetings of clubs or organizations: Not on file    Relationship status: Not on file  Other Topics Concern  . Not on file  Social History Narrative   Retired [employer]   Married   Non smoker some etoh   Travels a lot             Outpatient Medications Prior to Visit  Medication Sig Dispense Refill  . aspirin 81 MG tablet Take 81 mg by mouth daily.      . Cholecalciferol (VITAMIN D3 PO) Take 25 mg daily by mouth.    . clobetasol (OLUX) 0.05 % topical foam APPLY TO AFFECTED AREA  TWICE A DAY  10  . DHA-EPA-Vitamin E (OMEGA-3 COMPLEX PO) Take 1,200 mg by mouth 2 (two) times daily. ProOmega LDL    . losartan (COZAAR) 50 MG tablet TAKE 1 TABLET (50 MG TOTAL) BY MOUTH DAILY. 90 tablet 1  . magnesium oxide (MAG-OX) 400 MG tablet Take 400 mg daily by mouth.    . meclizine (ANTIVERT) 25 MG tablet Take 25 mg by mouth 3 (three) times daily as needed for dizziness or nausea.   0  . metoprolol tartrate (LOPRESSOR) 50 MG tablet TAKE 1/2 TABLET BY MOUTH IN THE MORNING AND 1 TABLET BY MOUTH IN THE EVENIG 135 tablet 3  . MULTIPLE VITAMIN PO Take 1 tablet by mouth daily.     Marland Kitchen MYRBETRIQ 25 MG TB24 tablet Take 25 mg by mouth daily.  0  . NITROSTAT 0.4 MG SL tablet PLACE 1 TABLET UNDER TONGUE EVERY 5 MINUTES AS NEEDED FOR CHEST PAIN 25 tablet 1  . Omega-3 Fatty Acids (FISH OIL ADULT GUMMIES PO) Take 1 tablet by mouth daily.    . RABEprazole (ACIPHEX) 20 MG tablet TAKE 1 TABLET BY MOUTH ONCE DAILY 90 tablet 0  . tamsulosin (FLOMAX) 0.4 MG CAPS capsule TAKE 1 CAPSULE (0.4 MG TOTAL) BY MOUTH DAILY. 90 capsule 0   Facility-Administered Medications Prior to Visit  Medication Dose Route Frequency Provider Last Rate Last Dose  . 0.9 %  sodium chloride infusion  500 mL Intravenous Continuous Milus Banister, MD         EXAM:  BP 124/70 (BP Location: Left Arm, Patient Position: Sitting, Cuff Size: Normal)   Pulse 74   Temp 97.8 F (36.6 C) (Oral)   Wt 200 lb 3.2 oz (90.8 kg)   SpO2 92%   BMI 30.89 kg/m   Body mass index is 30.89 kg/m.  GENERAL: vitals reviewed and listed above, alert, oriented, appears well hydrated and in no acute distress HEENT: atraumatic, conjunctiva  clear, no obvious abnormalities on inspection of external nose and ears right buttock area with palpable 3 cm cystic area with central denuded area and 2 small  Openings with opaque yellow     After persmission  Consent   1.5 %  Lidocaine with epi  #11 blade opened  And  expressed moderated   Cystic material and  some bloody exudate tolerated well.  Hemostat  To expore area    PSYCH: pleasant and cooperative, no obvious depression or anxiety  BP Readings from Last 3 Encounters:  10/15/18 124/70  08/14/18 118/66  04/22/18 112/64    ASSESSMENT AND PLAN:  Discussed the following assessment and plan:  Infected cyst of skin Local care   Hot compresses and   Antibiotic  And observation if  persistent or progressive fu plan but expect to  Improve  Over next week   Fu with alarm sx  -Patient advised to return or notify health care team  if  new concerns arise.  Patient Instructions  Infected cyst .   Opened up today . Continue hot warm compresses or showers to help drain  and cover .  Add antibiotic   If  persistent or progressive we can  Get surgery to see you.         Standley Brooking. Jafari Mckillop M.D.

## 2018-10-15 ENCOUNTER — Encounter: Payer: Self-pay | Admitting: Internal Medicine

## 2018-10-15 ENCOUNTER — Ambulatory Visit (INDEPENDENT_AMBULATORY_CARE_PROVIDER_SITE_OTHER): Payer: Medicare Other | Admitting: Internal Medicine

## 2018-10-15 VITALS — BP 124/70 | HR 74 | Temp 97.8°F | Wt 200.2 lb

## 2018-10-15 DIAGNOSIS — L729 Follicular cyst of the skin and subcutaneous tissue, unspecified: Secondary | ICD-10-CM | POA: Diagnosis not present

## 2018-10-15 DIAGNOSIS — I259 Chronic ischemic heart disease, unspecified: Secondary | ICD-10-CM | POA: Diagnosis not present

## 2018-10-15 DIAGNOSIS — L089 Local infection of the skin and subcutaneous tissue, unspecified: Secondary | ICD-10-CM

## 2018-10-15 MED ORDER — DOXYCYCLINE HYCLATE 100 MG PO CAPS: 100.0000 mg | ORAL_CAPSULE | Freq: Two times a day (BID) | ORAL | 0 refills | Status: DC

## 2018-10-15 NOTE — Patient Instructions (Addendum)
Infected cyst .   Opened up today . Continue hot warm compresses or showers to help drain  and cover .  Add antibiotic   If  persistent or progressive we can  Get surgery to see you.

## 2018-10-16 ENCOUNTER — Other Ambulatory Visit: Payer: Self-pay | Admitting: Cardiovascular Disease

## 2018-11-16 ENCOUNTER — Other Ambulatory Visit: Payer: Self-pay | Admitting: Internal Medicine

## 2018-11-18 ENCOUNTER — Other Ambulatory Visit: Payer: Self-pay | Admitting: *Deleted

## 2018-11-18 MED ORDER — LOSARTAN POTASSIUM 25 MG PO TABS: 50.0000 mg | ORAL_TABLET | Freq: Every day | ORAL | 2 refills | Status: DC

## 2018-11-18 NOTE — Telephone Encounter (Signed)
Per [facility], the losartan 50 mg tabs are on backorder and they are requesting an rx for the 25 mg tabs with a sig of take two tabs by mouth qd.

## 2019-01-03 DIAGNOSIS — N35811 Other urethral stricture, male, meatal: Secondary | ICD-10-CM | POA: Diagnosis not present

## 2019-01-03 DIAGNOSIS — N401 Enlarged prostate with lower urinary tract symptoms: Secondary | ICD-10-CM | POA: Diagnosis not present

## 2019-01-03 DIAGNOSIS — R3915 Urgency of urination: Secondary | ICD-10-CM | POA: Diagnosis not present

## 2019-01-08 NOTE — Progress Notes (Signed)
Chief Complaint  Patient presents with  . Eye Drainage    in left eye clear drainage is slighty painful been going on for 5 to 6days pt has tried hot compresses and witch hazel     HPI: [name] 78 y.o. come in for sda  Eye sx   Left lower eye lid red and below tender  Less pain today but more red  Clear watery dc no eye pain vision change fever .  Has been using  Warm compresses .  [place]    fyi to have urologic procedure at end of month   For urinary sx ? cystostcopy dr Lovena Neighbours ROS: See pertinent positives and negatives per HPI. No fever uri  Eye pain .  Past Medical History:  Diagnosis Date  . Abdominal pain, right lateral 02/21/2012   progressive.    . ALLERGIC RHINITIS 07/23/2007  . Allergy   . Arthritis   . Atrial fibrillation ([place]) 07/06/2009  . BACK PAIN 11/02/2009  . BACK PAIN, LUMBAR 10/25/2010  . BENIGN PROSTATIC HYPERTROPHY 07/25/2007  . 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 CAD 01/22/2009  . Cancer ([place])    skin  . 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  . Cataract   . COLONIC POLYPS, HX OF 10/18/2007  . Cough 10/25/2010  . DIZZINESS 01/22/2009  . DYSPNEA ON EXERTION 08/21/2008  . ECHOCARDIOGRAM, ABNORMAL 08/19/2007  . GERD 07/25/2007  . Heart murmur   . HERNIA 07/06/2009  . HX, PERSONAL, MALIGNANCY, SKIN NEC 10/18/2007  . HYPERGLYCEMIA, BORDERLINE 12/31/2007  . HYPERLIPIDEMIA 07/25/2007  . HYPERTENSION 07/23/2007  . Inguinal hernia   . LUMBAR RADICULOPATHY, RIGHT 10/18/2007  . MUSCLE PAIN 01/22/2009  . Nausea   . NEURITIS 10/25/2010  . Palpitations 05/09/2010  . RHINITIS, CHRONIC 05/11/2010  . RUQ PAIN 01/22/2009  . Sinus polyp   . UNS ADVRS EFF OTH RX MEDICINAL&BIOLOGICAL SBSTNC 04/15/2008  . 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 VITAMIN D DEFICIENCY 11/02/2009  . Voice strain     Family History  Problem Relation Age of Onset  . Lung cancer Mother   . Cancer Mother   . Heart attack Father   . Hypertension Father   . Alcohol abuse Father   . Heart disease Father     . Breast cancer Sister   . Cancer Sister        breast  . Alcohol abuse Brother   . Hypertension Brother   . Alcohol abuse Brother   . Hypertension Brother   . Alcohol abuse Brother   . Hypertension Brother   . Kidney disease Sister   . Colon cancer Neg Hx   . Esophageal cancer Neg Hx   . Rectal cancer Neg Hx   . Stomach cancer Neg Hx     Social History   Socioeconomic History  . Marital status: Married    Spouse name: Not on file  . Number of children: Not on file  . Years of education: Not on file  . Highest education level: Not on file  Occupational History  . Not on file  Social Needs  . Financial resource strain: Not on file  . Food insecurity:    Worry: Not on file    Inability: Not on file  . Transportation needs:    Medical: Not on file    Non-medical: Not on file  Tobacco Use  . Smoking status: Former Smoker    Types: Cigarettes    Last attempt to quit: 04/02/1964    Years since  quitting: 54.8  . Smokeless tobacco: Never Used  . Tobacco comment: in early 20's he quit  Substance and Sexual Activity  . Alcohol use: Yes    Comment: socially  . Drug use: No  . Sexual activity: Not on file  Lifestyle  . Physical activity:    Days per week: Not on file    Minutes per session: Not on file  . Stress: Not on file  Relationships  . Social connections:    Talks on phone: Not on file    Gets together: Not on file    Attends religious service: Not on file    Active member of club or organization: Not on file    Attends meetings of clubs or organizations: Not on file    Relationship status: Not on file  Other Topics Concern  . Not on file  Social History Narrative   Retired [employer]   Married   Non smoker some etoh   Travels a lot             Outpatient Medications Prior to Visit  Medication Sig Dispense Refill  . aspirin 81 MG tablet Take 81 mg by mouth daily.      . Cholecalciferol (VITAMIN D3 PO) Take 25 mg daily by mouth.    . clobetasol  (OLUX) 0.05 % topical foam APPLY TO AFFECTED AREA TWICE A DAY  10  . Coenzyme Q10 100 MG TABS Take 1 tablet by mouth daily.    . DHA-EPA-Vitamin E (OMEGA-3 COMPLEX PO) Take 1,200 mg by mouth 2 (two) times daily. ProOmega LDL    . losartan (COZAAR) 25 MG tablet Take 2 tablets (50 mg total) by mouth daily. 180 tablet 2  . magnesium oxide (MAG-OX) 400 MG tablet Take 400 mg daily by mouth.    . meclizine (ANTIVERT) 25 MG tablet Take 25 mg by mouth 3 (three) times daily as needed for dizziness or nausea.   0  . metoprolol tartrate (LOPRESSOR) 50 MG tablet TAKE 1/2 TABLET BY MOUTH IN THE MORNING AND 1 TABLET BY MOUTH IN THE EVENIG 135 tablet 3  . MULTIPLE VITAMIN PO Take 1 tablet by mouth daily.     Marland Kitchen MYRBETRIQ 25 MG TB24 tablet Take 25 mg by mouth daily.  0  . NITROSTAT 0.4 MG SL tablet PLACE 1 TABLET UNDER TONGUE EVERY 5 MINUTES AS NEEDED FOR CHEST PAIN 25 tablet 1  . Omega-3 Fatty Acids (FISH OIL ADULT GUMMIES PO) Take 1 tablet by mouth daily.    . RABEprazole (ACIPHEX) 20 MG tablet TAKE 1 TABLET BY MOUTH ONCE DAILY 90 tablet 0  . tamsulosin (FLOMAX) 0.4 MG CAPS capsule TAKE 1 CAPSULE (0.4 MG TOTAL) BY MOUTH DAILY. 90 capsule 0  . doxycycline (VIBRAMYCIN) 100 MG capsule Take 1 capsule (100 mg total) by mouth 2 (two) times daily. 20 capsule 0   Facility-Administered Medications Prior to Visit  Medication Dose Route Frequency Provider Last Rate Last Dose  . 0.9 %  sodium chloride infusion  500 mL Intravenous Continuous Milus Banister, MD         EXAM:  BP (!) 144/80 (BP Location: Right Arm, Patient Position: Sitting, Cuff Size: Large)   Pulse 87   Temp 98.8 F (37.1 C) (Oral)   Wt 203 lb 11.2 oz (92.4 kg)   BMI 31.43 kg/m   Body mass index is 31.43 kg/m.  GENERAL: vitals reviewed and listed above, alert, oriented, appears well hydrated and in no acute distress  with obvious redness ans swelling left lower lid    HEENT: atraumatic,  Bulbar conjunctiva is   clear,   lwer lid internal  papule no lulcer and fiery red   Inferior  To eye puffy red and tender   But no streaking   Clear dc at nasaocalicular area but no swelling  Medical no obvious abnormalities on inspection of external nose and ears  eoms are normal PSYCH: pleasant and cooperative, no obvious depression or anxiety  BP Readings from Last 3 Encounters:  01/09/19 (!) 144/80  10/15/18 124/70  08/14/18 118/66    ASSESSMENT AND PLAN:  Discussed the following assessment and plan:  Hordeolum internum of left lower eyelid  Infection of eyelid  ? Local care but  If swelling and dependent  Edema redness worsening add oral antibiotic   .  Expectant management.  -Patient advised to return or notify health care team  if  new concerns arise.  Patient Instructions  Warm compresses   And then put in oiintment on lower lid   Expect improvement in the next 2-3 days and if not see eye doctor .   If the redness pain gets worse then add the oral antibiotic.         Standley Brooking. Panosh M.D.

## 2019-01-09 ENCOUNTER — Encounter: Payer: Self-pay | Admitting: Internal Medicine

## 2019-01-09 ENCOUNTER — Ambulatory Visit (INDEPENDENT_AMBULATORY_CARE_PROVIDER_SITE_OTHER): Payer: Medicare Other | Admitting: Internal Medicine

## 2019-01-09 VITALS — BP 144/80 | HR 87 | Temp 98.8°F | Wt 203.7 lb

## 2019-01-09 DIAGNOSIS — H019 Unspecified inflammation of eyelid: Secondary | ICD-10-CM | POA: Diagnosis not present

## 2019-01-09 DIAGNOSIS — H00025 Hordeolum internum left lower eyelid: Secondary | ICD-10-CM

## 2019-01-09 MED ORDER — ERYTHROMYCIN 5 MG/GM OP OINT: 1.0000 "application " | TOPICAL_OINTMENT | Freq: Four times a day (QID) | OPHTHALMIC | 0 refills | Status: DC

## 2019-01-09 MED ORDER — DOXYCYCLINE HYCLATE 100 MG PO TABS: 100.0000 mg | ORAL_TABLET | Freq: Two times a day (BID) | ORAL | 0 refills | Status: DC

## 2019-01-09 NOTE — Patient Instructions (Addendum)
Warm compresses   And then put in oiintment on lower lid   Expect improvement in the next 2-3 days and if not see eye doctor .   If the redness pain gets worse then add the oral antibiotic.

## 2019-01-11 ENCOUNTER — Other Ambulatory Visit: Payer: Self-pay | Admitting: Nurse Practitioner

## 2019-01-24 DIAGNOSIS — R351 Nocturia: Secondary | ICD-10-CM | POA: Diagnosis not present

## 2019-01-24 DIAGNOSIS — R3912 Poor urinary stream: Secondary | ICD-10-CM | POA: Diagnosis not present

## 2019-01-24 DIAGNOSIS — R3915 Urgency of urination: Secondary | ICD-10-CM | POA: Diagnosis not present

## 2019-01-24 DIAGNOSIS — N401 Enlarged prostate with lower urinary tract symptoms: Secondary | ICD-10-CM | POA: Diagnosis not present

## 2019-01-27 DIAGNOSIS — H01002 Unspecified blepharitis right lower eyelid: Secondary | ICD-10-CM | POA: Diagnosis not present

## 2019-01-27 DIAGNOSIS — H01001 Unspecified blepharitis right upper eyelid: Secondary | ICD-10-CM | POA: Diagnosis not present

## 2019-01-27 DIAGNOSIS — H524 Presbyopia: Secondary | ICD-10-CM | POA: Diagnosis not present

## 2019-01-27 DIAGNOSIS — H04123 Dry eye syndrome of bilateral lacrimal glands: Secondary | ICD-10-CM | POA: Diagnosis not present

## 2019-01-27 DIAGNOSIS — Z961 Presence of intraocular lens: Secondary | ICD-10-CM | POA: Diagnosis not present

## 2019-02-03 DIAGNOSIS — N401 Enlarged prostate with lower urinary tract symptoms: Secondary | ICD-10-CM | POA: Diagnosis not present

## 2019-02-05 ENCOUNTER — Ambulatory Visit: Payer: Medicare Other | Admitting: Nurse Practitioner

## 2019-02-12 ENCOUNTER — Other Ambulatory Visit: Payer: Self-pay | Admitting: Internal Medicine

## 2019-02-19 ENCOUNTER — Ambulatory Visit (INDEPENDENT_AMBULATORY_CARE_PROVIDER_SITE_OTHER): Payer: Medicare Other | Admitting: Nurse Practitioner

## 2019-02-19 ENCOUNTER — Encounter: Payer: Self-pay | Admitting: Nurse Practitioner

## 2019-02-19 VITALS — BP 132/68 | HR 71 | Ht 67.5 in | Wt 206.0 lb

## 2019-02-19 DIAGNOSIS — R0600 Dyspnea, unspecified: Secondary | ICD-10-CM | POA: Diagnosis not present

## 2019-02-19 DIAGNOSIS — I259 Chronic ischemic heart disease, unspecified: Secondary | ICD-10-CM

## 2019-02-19 DIAGNOSIS — E7849 Other hyperlipidemia: Secondary | ICD-10-CM | POA: Diagnosis not present

## 2019-02-19 LAB — HEPATIC FUNCTION PANEL
ALT: 20 IU/L (ref 0–44)
AST: 17 IU/L (ref 0–40)
Albumin: 4.3 g/dL (ref 3.7–4.7)
Alkaline Phosphatase: 62 IU/L (ref 39–117)
Bilirubin Total: 0.6 mg/dL (ref 0.0–1.2)
Bilirubin, Direct: 0.16 mg/dL (ref 0.00–0.40)
Total Protein: 6.4 g/dL (ref 6.0–8.5)

## 2019-02-19 LAB — BASIC METABOLIC PANEL
BUN/Creatinine Ratio: 19 (ref 10–24)
BUN: 16 mg/dL (ref 8–27)
CO2: 21 mmol/L (ref 20–29)
Calcium: 9.5 mg/dL (ref 8.6–10.2)
Chloride: 105 mmol/L (ref 96–106)
Creatinine, Ser: 0.83 mg/dL (ref 0.76–1.27)
GFR calc Af Amer: 97 mL/min/{1.73_m2} (ref >59)
GFR calc non Af Amer: 84 mL/min/{1.73_m2} (ref >59)
Glucose: 112 mg/dL — ABNORMAL HIGH (ref 65–99)
Potassium: 4.4 mmol/L (ref 3.5–5.2)
Sodium: 141 mmol/L (ref 134–144)

## 2019-02-19 LAB — CBC
Hematocrit: 43.1 % (ref 37.5–51.0)
Hemoglobin: 15.5 g/dL (ref 13.0–17.7)
MCH: 32.6 pg (ref 26.6–33.0)
MCHC: 36 g/dL — ABNORMAL HIGH (ref 31.5–35.7)
MCV: 91 fL (ref 79–97)
Platelets: 224 10*3/uL (ref 150–450)
RBC: 4.75 x10E6/uL (ref 4.14–5.80)
RDW: 12.7 % (ref 11.6–15.4)
WBC: 7.1 10*3/uL (ref 3.4–10.8)

## 2019-02-19 LAB — LIPID PANEL
Chol/HDL Ratio: 4.5 ratio (ref 0.0–5.0)
Cholesterol, Total: 189 mg/dL (ref 100–199)
HDL: 42 mg/dL (ref >39)
LDL Calculated: 131 mg/dL — ABNORMAL HIGH (ref 0–99)
Triglycerides: 80 mg/dL (ref 0–149)
VLDL Cholesterol Cal: 16 mg/dL (ref 5–40)

## 2019-02-19 LAB — PRO B NATRIURETIC PEPTIDE: NT-Pro BNP: 139 pg/mL (ref 0–486)

## 2019-02-19 MED ORDER — NITROGLYCERIN 0.4 MG SL SUBL: SUBLINGUAL_TABLET | SUBLINGUAL | 3 refills | Status: DC

## 2019-02-19 NOTE — Progress Notes (Signed)
CARDIOLOGY OFFICE NOTE  Date:  02/19/2019    [name] Date of Birth: [birth year] Medical Record #[id number]  PCP:  [name], MD  Cardiologist:  Gillian Shields    Chief Complaint  Patient presents with  . Follow-up  . Shortness of Breath  . Dizziness    6 month check.     History of Present Illness: [name] is a 78 y.o. male who presents today for a 6 month check. Seen for Dr. Johnsie Cancel. Primarily sees me.   He has a history of known CAD with priorCABG in 2009. Other issues include HLD, BPH and HTN. He had repeat cath back in 2013 with patent grafts noted. Lesion in a small unbypassed OM to be managed medically.He has had muscle aches/pains/weakness with statin therapy. He has been on altered doses of Lipitor in the past with poor tolerance reported.   I have seen him the past several visits. He has had more in the way of palpitations. Cautious increase in beta blocker due to 1st degree AV block. Echo updated last November of 2018 as well as a Holter - these studies were stable.   Seen 6 moths ago - had been to [place] for 3 months - had a spell of what sounded like vertigo. Saw a neurosurgeon as well for back pain - found to have spinal stenosis and was referred for acupuncture and PT. He was using Gabapentin and Relafen "as needed" for back pain but this made him dizzy.On my visit he was doing ok from a cardiac standpoint.   Comes in today. [place].  He notes he has gotten a little more short of breath. Comes on at rest. Some with over exertion. He is worried about this but wonders if it is from being almost 76 and gaining weight. He will get short of breath with bending over now. He has had no chest pain. He is "doing too much sitting". His weight is up. He is trying to use his bike more just here recently. He has had some lightheadedness "on the top of my head" - this happened with missing his night time medicines for a few nights. He says he is not  dizzy. No spells of feeling like he would pass out. Does not check his BP at home - sounds like his machine does not correlate. He is now using a "natural supplement" for his cholesterol.   Past Medical History:  Diagnosis Date  . Abdominal pain, right lateral 02/21/2012   progressive.    . ALLERGIC RHINITIS 07/23/2007  . Allergy   . Arthritis   . Atrial fibrillation ([place]) 07/06/2009  . BACK PAIN 11/02/2009  . BACK PAIN, LUMBAR 10/25/2010  . BENIGN PROSTATIC HYPERTROPHY 07/25/2007  . 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 CAD 01/22/2009  . Cancer ([place])    skin  . 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  . Cataract   . COLONIC POLYPS, HX OF 10/18/2007  . Cough 10/25/2010  . DIZZINESS 01/22/2009  . DYSPNEA ON EXERTION 08/21/2008  . ECHOCARDIOGRAM, ABNORMAL 08/19/2007  . GERD 07/25/2007  . Heart murmur   . HERNIA 07/06/2009  . HX, PERSONAL, MALIGNANCY, SKIN NEC 10/18/2007  . HYPERGLYCEMIA, BORDERLINE 12/31/2007  . HYPERLIPIDEMIA 07/25/2007  . HYPERTENSION 07/23/2007  . Inguinal hernia   . LUMBAR RADICULOPATHY, RIGHT 10/18/2007  . MUSCLE PAIN 01/22/2009  . Nausea   . NEURITIS 10/25/2010  . Palpitations 05/09/2010  . RHINITIS, CHRONIC 05/11/2010  . RUQ PAIN 01/22/2009  .  Sinus polyp   . UNS ADVRS EFF OTH RX MEDICINAL&BIOLOGICAL SBSTNC 04/15/2008  . 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 VITAMIN D DEFICIENCY 11/02/2009  . Voice strain     Past Surgical History:  Procedure Laterality Date  . BACK SURGERY    . basal cell and melanoma removed     face  . BONY PELVIS SURGERY    . CATARACT EXTRACTION     BIL  . COLONOSCOPY    . CORONARY ARTERY BYPASS GRAFT  2009  . HERNIA REPAIR  7 times   inguinal  . LAPAROSCOPIC CHOLECYSTECTOMY  03/03/2013  . LUMBAR SPINE SURGERY    . POLYPECTOMY    . TONSILLECTOMY       Medications: Current Meds  Medication Sig  . aspirin 81 MG tablet Take 81 mg by mouth daily.    . Cholecalciferol (VITAMIN D3 PO) Take 25 mg daily by mouth.  . clobetasol (OLUX) 0.05 % topical foam  APPLY TO AFFECTED AREA TWICE A DAY  . Coenzyme Q10 100 MG TABS Take 1 tablet by mouth daily.  . DHA-EPA-Vitamin E (OMEGA-3 COMPLEX PO) Take 1,200 mg by mouth 2 (two) times daily. ProOmega LDL  . losartan (COZAAR) 25 MG tablet Take 2 tablets (50 mg total) by mouth daily.  . magnesium oxide (MAG-OX) 400 MG tablet Take 400 mg daily by mouth.  . meclizine (ANTIVERT) 25 MG tablet Take 25 mg by mouth 3 (three) times daily as needed for dizziness or nausea.   . metoprolol tartrate (LOPRESSOR) 50 MG tablet TAKE 1/2 TABLET BY MOUTH IN THE MORNING AND 1 TABLET BY MOUTH IN THE EVENIG  . MULTIPLE VITAMIN PO Take 1 tablet by mouth daily.   Marland Kitchen MYRBETRIQ 25 MG TB24 tablet Take 25 mg by mouth daily.  . nitroGLYCERIN (NITROSTAT) 0.4 MG SL tablet PLACE 1 TABLET UNDER TONGUE EVERY 5 MINUTES AS NEEDED FOR CHEST PAIN  . Omega-3 Fatty Acids (FISH OIL ADULT GUMMIES PO) Take 1 tablet by mouth daily.  . RABEprazole (ACIPHEX) 20 MG tablet TAKE 1 TABLET BY MOUTH ONCE DAILY  . tamsulosin (FLOMAX) 0.4 MG CAPS capsule TAKE 1 CAPSULE (0.4 MG TOTAL) BY MOUTH DAILY.  . [DISCONTINUED] doxycycline (VIBRA-TABS) 100 MG tablet Take 1 tablet (100 mg total) by mouth 2 (two) times daily.  . [DISCONTINUED] erythromycin ophthalmic ointment Place 1 application into the left eye 4 (four) times daily. After. compresses  . [DISCONTINUED] NITROSTAT 0.4 MG SL tablet PLACE 1 TABLET UNDER TONGUE EVERY 5 MINUTES AS NEEDED FOR CHEST PAIN   Current Facility-Administered Medications for the 02/19/19 encounter (Office Visit) with Jon Junes, NP  Medication  . 0.9 %  sodium chloride infusion     Allergies: Allergies  Allergen Reactions  . Propoxyphene N-Acetaminophen Nausea And Vomiting  . Iodinated Diagnostic Agents Swelling    Pt developed slight lt upper lip swelling only to one side about 15 minutes post IV contrast injection. No other symptoms.     Social History: The patient  reports that he quit smoking about 54 years ago. His  smoking use included cigarettes. He has never used smokeless tobacco. He reports current alcohol use. He reports that he does not use drugs.   Family History: The patient's family history includes Alcohol abuse in his brother, brother, brother, and father; Breast cancer in his sister; Cancer in his mother and sister; Heart attack in his father; Heart disease in his father; Hypertension in his brother, brother, brother, and father; Kidney disease in his sister; Lung  cancer in his mother.   Review of Systems: Please see the history of present illness.   Otherwise, the review of systems is positive for none.   All other systems are reviewed and negative.   Physical Exam: VS:  BP 132/68 (BP Location: Left Arm, Patient Position: Sitting, Cuff Size: Normal)   Pulse 71   Ht 5' 7.5" (1.715 m)   Wt 206 lb (93.4 kg)   BMI 31.79 kg/m  .  BMI Body mass index is 31.79 kg/m.  Wt Readings from Last 3 Encounters:  02/19/19 206 lb (93.4 kg)  01/09/19 203 lb 11.2 oz (92.4 kg)  10/15/18 200 lb 3.2 oz (90.8 kg)   BP recheck by me is 120/80  General: Pleasant. Elderly. Alert and in no acute distress.  He has gained 7 pounds since my last visit.  HEENT: Normal.  Neck: Supple, no JVD, carotid bruits, or masses noted.  Cardiac: Regular rate and rhythm. Outflow murmur noted. No edema.  Respiratory:  Lungs are clear to auscultation bilaterally with normal work of breathing.  GI: Soft and nontender.  MS: No deformity or atrophy. Gait and ROM intact.  Skin: Warm and dry. Color is normal.  Neuro:  Strength and sensation are intact and no gross focal deficits noted.  Psych: Alert, appropriate and with normal affect.   LABORATORY DATA:  EKG:  EKG is ordered today. This demonstrates NSR with 1st degree AV block.  Lab Results  Component Value Date   WBC 6.3 08/14/2018   HGB 15.9 08/14/2018   HCT 45.6 08/14/2018   PLT 208 08/14/2018   GLUCOSE 106 (H) 08/14/2018   CHOL 193 08/14/2018   TRIG 95  08/14/2018   HDL 44 08/14/2018   LDLDIRECT 187.2 03/31/2008   LDLCALC 130 (H) 08/14/2018   ALT 20 08/14/2018   AST 16 08/14/2018   NA 141 08/14/2018   K 4.4 08/14/2018   CL 103 08/14/2018   CREATININE 0.79 08/14/2018   BUN 13 08/14/2018   CO2 22 08/14/2018   TSH 1.480 10/30/2017   PSA 1.88 09/03/2012   INR 1.1 (H) 01/25/2012   HGBA1C 5.8 02/20/2017     BNP (last 3 results) No results for input(s): BNP in the last 8760 hours.  ProBNP (last 3 results) No results for input(s): PROBNP in the last 8760 hours.   Other Studies Reviewed Today:  Holter Study Highlights 10/2017  NSR PAC;s / PVC;s No significant arrhythmias PVC;s account for less than 1% total beats    Echo Study Conclusions 10/2017  - Left ventricle: The cavity size was normal. There was moderate concentric hypertrophy. Systolic function was normal. The estimated ejection fraction was in the range of 60% to 65%. Wall motion was normal; there were no regional wall motion abnormalities. Doppler parameters are consistent with abnormal left ventricular relaxation (grade 1 diastolic dysfunction). Doppler parameters are consistent with elevated ventricular end-diastolic filling pressure. - Aortic valve: Trileaflet; normal thickness leaflets. There was no regurgitation. - Aortic root: The aortic root was normal in size. - Mitral valve: There was mild regurgitation. - Left atrium: The atrium was moderately dilated. - Right ventricle: Systolic function was normal. - Right atrium: The atrium was normal in size. - Tricuspid valve: There was trivial regurgitation. - Pulmonary arteries: Systolic pressure was within the normal range. - Inferior vena cava: The vessel was normal in size. The respirophasic diameter changes were in the normal range (= 50%), consistent with normal central venous pressure. - Pericardium, extracardiac: There was no  pericardial  effusion.   Assessment/Plan:  1. Ischemic heart disease - remote CABG - no active chest pain - would favor continuing his current regimen. He does endorse some shortness of breath - I suspect this is from weight gain and inactivity. Checking lab today. Including BNP. Encouraged him to try and ride his bike more regularly. NTG refilled today - he is not using.   2. Murmur - echo from November 2018 noted. Seems unchanged.   3. Palpitations - not endorsed today - his Holter was stable from 2018.   4. 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 Unchanged. I think we can continue with his current regimen for now.   5. HTN - BP recheck by me is ok. No changes made today.   6. HLD - no longer on his statin - he was using some Red Yeast Rice -has not tolerated this - now using some type of "natural supplement" - recheck ab today - consider lipid clinic referral - could consider Zetia.    Current medicines are reviewed with the patient today.  The patient does not have concerns regarding medicines other than what has been noted above.  The following changes have been made:  See above.  Labs/ tests ordered today include:    Orders Placed This Encounter  Procedures  . Basic metabolic panel  . CBC  . Hepatic function panel  . Lipid panel  . EKG 12-Lead     Disposition:   FU with me in 4 months. If his shortness of breath worsens despite exercise and weight loss, he is to let me know.  Patient is agreeable to this plan and will call if any problems develop in the interim.   SignedTruitt Merle, NP  02/19/2019 10:50 AM  [facility] [address] [place], [place]  [address] Phone: [phone] Fax: [phone]

## 2019-02-19 NOTE — Patient Instructions (Addendum)
We will be checking the following labs today - BMET, CBC, HPF, Lipids & BNP   Medication Instructions:    Continue with your current medicines.   I have sent in your refill for NTG today   If you need a refill on your cardiac medications before your next appointment, please call your pharmacy.     Testing/Procedures To Be Arranged:  N/A  Follow-Up:   See me in about 4 months    At [facility], you and your health needs are our priority.  As part of our continuing mission to provide you with exceptional heart care, we have created designated Provider Care Teams.  These Care Teams include your primary Cardiologist (physician) and Advanced Practice Providers (APPs -  Physician Assistants and Nurse Practitioners) who all work together to provide you with the care you need, when you need it.  Special Instructions:  . Try to increase your bike to every day . We will see what your cholesterol levels look like on this natural supplement you are taking - we may need to send you to see the pharmacist based on those results.  . If your shortness of breath gets worse let me know.   Call the [place] office at [phone] if you have any questions, problems or concerns.

## 2019-02-20 ENCOUNTER — Other Ambulatory Visit: Payer: Self-pay | Admitting: *Deleted

## 2019-02-20 MED ORDER — EZETIMIBE 10 MG PO TABS: 10.0000 mg | ORAL_TABLET | Freq: Every day | ORAL | 9 refills | Status: DC

## 2019-02-21 DIAGNOSIS — R3915 Urgency of urination: Secondary | ICD-10-CM | POA: Diagnosis not present

## 2019-02-21 DIAGNOSIS — N401 Enlarged prostate with lower urinary tract symptoms: Secondary | ICD-10-CM | POA: Diagnosis not present

## 2019-02-21 DIAGNOSIS — R3912 Poor urinary stream: Secondary | ICD-10-CM | POA: Diagnosis not present

## 2019-02-21 LAB — PSA: PSA: 1.49

## 2019-03-13 ENCOUNTER — Encounter: Payer: Self-pay | Admitting: Internal Medicine

## 2019-03-28 ENCOUNTER — Other Ambulatory Visit: Payer: Self-pay

## 2019-03-28 ENCOUNTER — Encounter: Payer: Self-pay | Admitting: Internal Medicine

## 2019-03-28 ENCOUNTER — Ambulatory Visit (INDEPENDENT_AMBULATORY_CARE_PROVIDER_SITE_OTHER): Payer: Medicare Other | Admitting: Internal Medicine

## 2019-03-28 ENCOUNTER — Telehealth: Payer: Self-pay

## 2019-03-28 DIAGNOSIS — I259 Chronic ischemic heart disease, unspecified: Secondary | ICD-10-CM

## 2019-03-28 DIAGNOSIS — H00015 Hordeolum externum left lower eyelid: Secondary | ICD-10-CM | POA: Diagnosis not present

## 2019-03-28 DIAGNOSIS — H019 Unspecified inflammation of eyelid: Secondary | ICD-10-CM | POA: Diagnosis not present

## 2019-03-28 MED ORDER — ERYTHROMYCIN 5 MG/GM OP OINT: 1.0000 "application " | TOPICAL_OINTMENT | Freq: Four times a day (QID) | OPHTHALMIC | 1 refills | Status: DC

## 2019-03-28 NOTE — Telephone Encounter (Signed)
Copied from [place] [id number]. Topic: General - Inquiry >> Mar 28, 2019  9:51 AM Virl Axe D wrote: Reason for CRM: Pt's wife called and stated that pt has again developed a sty in his left eye. It is swollen and leaking mucus. She would like to know if Dr. Regis Bill could send in eye drops like she has done in the past. Please advise. CB#[phone]   [id number] [address], [place] [phone] (Phone) [phone] (Fax)

## 2019-03-28 NOTE — Telephone Encounter (Signed)
Please advise if you would like a virtual visit or if you will rx some eye drops

## 2019-03-28 NOTE — Telephone Encounter (Signed)
See if they can do a virtual visit  [email or web address]   Or web x ? So I can see the area as possible

## 2019-03-28 NOTE — Progress Notes (Signed)
Virtual Visit via Video Note  I connected with@ on 03/28/19 at  3:00 PM EDT by a video enabled telemedicine application and verified that I am speaking with the correct person using two identifiers. Location patient: home Location provider:work or home office Persons participating in the virtual visit: patient, provider difficulties with    Getting video and audio to work at the same time [employer] recommendations  regarding COVID 19 pandemic   video visit is advised over in office visit for this patient.  Discussed the limitations of evaluation and management by telemedicine and  availability of in person appointments. The patient expressed understanding and agreed to proceed.   HPI: [name] Video  worked but not audio at the same time so called also   About 5 days of left lower eye lid redness and drainage without fever  . Hx of tenders rea  In January and better with  Topicals and doxy  This time some drainage but not as red .  No fever uri sx  Vision changes  Sin infection  ROS: See pertinent positives and negatives per HPI.  Past Medical History:  Diagnosis Date  . Abdominal pain, right lateral 02/21/2012   progressive.    . ALLERGIC RHINITIS 07/23/2007  . Allergy   . Arthritis   . Atrial fibrillation ([place]) 07/06/2009  . BACK PAIN 11/02/2009  . BACK PAIN, LUMBAR 10/25/2010  . BENIGN PROSTATIC HYPERTROPHY 07/25/2007  . 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 CAD 01/22/2009  . Cancer ([place])    skin  . 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  . Cataract   . COLONIC POLYPS, HX OF 10/18/2007  . Cough 10/25/2010  . DIZZINESS 01/22/2009  . DYSPNEA ON EXERTION 08/21/2008  . ECHOCARDIOGRAM, ABNORMAL 08/19/2007  . GERD 07/25/2007  . Heart murmur   . HERNIA 07/06/2009  . HX, PERSONAL, MALIGNANCY, SKIN NEC 10/18/2007  . HYPERGLYCEMIA, BORDERLINE 12/31/2007  . HYPERLIPIDEMIA 07/25/2007  . HYPERTENSION 07/23/2007  . Inguinal hernia   . LUMBAR RADICULOPATHY, RIGHT 10/18/2007  . MUSCLE  PAIN 01/22/2009  . Nausea   . NEURITIS 10/25/2010  . Palpitations 05/09/2010  . RHINITIS, CHRONIC 05/11/2010  . RUQ PAIN 01/22/2009  . Sinus polyp   . UNS ADVRS EFF OTH RX MEDICINAL&BIOLOGICAL SBSTNC 04/15/2008  . 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 VITAMIN D DEFICIENCY 11/02/2009  . Voice strain     Past Surgical History:  Procedure Laterality Date  . BACK SURGERY    . basal cell and melanoma removed     face  . BONY PELVIS SURGERY    . CATARACT EXTRACTION     BIL  . COLONOSCOPY    . CORONARY ARTERY BYPASS GRAFT  2009  . HERNIA REPAIR  7 times   inguinal  . LAPAROSCOPIC CHOLECYSTECTOMY  03/03/2013  . LUMBAR SPINE SURGERY    . POLYPECTOMY    . TONSILLECTOMY      Family History  Problem Relation Age of Onset  . Lung cancer Mother   . Cancer Mother   . Heart attack Father   . Hypertension Father   . Alcohol abuse Father   . Heart disease Father   . Breast cancer Sister   . Cancer Sister        breast  . Alcohol abuse Brother   . Hypertension Brother   . Alcohol abuse Brother   . Hypertension Brother   . Alcohol abuse Brother   . Hypertension Brother   . Kidney disease Sister   . Colon  cancer Neg Hx   . Esophageal cancer Neg Hx   . Rectal cancer Neg Hx   . Stomach cancer Neg Hx     SOCIAL HX:    Current Outpatient Medications:  .  aspirin 81 MG tablet, Take 81 mg by mouth daily.  , Disp: , Rfl:  .  Cholecalciferol (VITAMIN D3 PO), Take 25 mg daily by mouth., Disp: , Rfl:  .  clobetasol (OLUX) 0.05 % topical foam, APPLY TO AFFECTED AREA TWICE A DAY, Disp: , Rfl: 10 .  Coenzyme Q10 100 MG TABS, Take 1 tablet by mouth daily., Disp: , Rfl:  .  DHA-EPA-Vitamin E (OMEGA-3 COMPLEX PO), Take 1,200 mg by mouth 2 (two) times daily. ProOmega LDL, Disp: , Rfl:  .  erythromycin ophthalmic ointment, Place 1 application into the left eye 4 (four) times daily. After. compresses, Disp: 3.5 g, Rfl: 1 .  ezetimibe (ZETIA) 10 MG tablet, Take 1 tablet (10 mg total) by mouth daily., Disp: 30  tablet, Rfl: 9 .  losartan (COZAAR) 25 MG tablet, Take 2 tablets (50 mg total) by mouth daily., Disp: 180 tablet, Rfl: 2 .  magnesium oxide (MAG-OX) 400 MG tablet, Take 400 mg daily by mouth., Disp: , Rfl:  .  meclizine (ANTIVERT) 25 MG tablet, Take 25 mg by mouth 3 (three) times daily as needed for dizziness or nausea. , Disp: , Rfl: 0 .  metoprolol tartrate (LOPRESSOR) 50 MG tablet, TAKE 1/2 TABLET BY MOUTH IN THE MORNING AND 1 TABLET BY MOUTH IN THE EVENIG, Disp: 135 tablet, Rfl: 1 .  MULTIPLE VITAMIN PO, Take 1 tablet by mouth daily. , Disp: , Rfl:  .  MYRBETRIQ 25 MG TB24 tablet, Take 25 mg by mouth daily., Disp: , Rfl: 0 .  nitroGLYCERIN (NITROSTAT) 0.4 MG SL tablet, PLACE 1 TABLET UNDER TONGUE EVERY 5 MINUTES AS NEEDED FOR CHEST PAIN, Disp: 25 tablet, Rfl: 3 .  Omega-3 Fatty Acids (FISH OIL ADULT GUMMIES PO), Take 1 tablet by mouth daily., Disp: , Rfl:  .  RABEprazole (ACIPHEX) 20 MG tablet, TAKE 1 TABLET BY MOUTH ONCE DAILY, Disp: 90 tablet, Rfl: 0 .  tamsulosin (FLOMAX) 0.4 MG CAPS capsule, TAKE 1 CAPSULE (0.4 MG TOTAL) BY MOUTH DAILY., Disp: 90 capsule, Rfl: 0  Current Facility-Administered Medications:  .  0.9 %  sodium chloride infusion, 500 mL, Intravenous, Continuous, Milus Banister, MD  EXAM:  VITALS per patient if applicable:  GENERAL: alert, oriented, appears well and in no acute distress  HEENT: atraumatic, left lower eye lid red with pointing and yellow dc  Upper lid looks normal  , no obvious abnormalities on inspection of external nose and ears eoms appear normal   NECK: normal movements of the head and neck  LUNGS: on inspection no signs of respiratory distress, breathing rate appears normal, no obvious gross SOB, gasping or wheezing  CV: no obvious cyanosis  MS: moves all visible extremities without noticeable abnormality  PSYCH/NEURO: pleasant and cooperative, no obvious depression or anxiety, speech and thought processing grossly intact  ASSESSMENT AND  PLAN:  Discussed the following assessment and plan:  Hordeolum externum of left lower eyelid  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   Refill e Emycin  Ointment  and fu if not better  Refill e mycin ointment  And compresses and fu if  persistent or progressive    Expectant  management and discussion of plan and treatment with patient with opportunity to ask questions and all were answered. The patient agreed with the plan and demonstrated an understanding of the instructions.   The patient was advised to call back or seek an in-person evaluation if worsening having concerns    or if the condition fails to improve as anticipated.  Shanon Ace, MD

## 2019-03-28 NOTE — Telephone Encounter (Signed)
DoxyMe visit scheduled for today at 3:00PM

## 2019-03-31 ENCOUNTER — Telehealth: Payer: Self-pay

## 2019-03-31 MED ORDER — DOXYCYCLINE HYCLATE 100 MG PO TABS: 100.0000 mg | ORAL_TABLET | Freq: Two times a day (BID) | ORAL | 0 refills | Status: DC

## 2019-03-31 NOTE — Addendum Note (Signed)
Addended byShanon Ace K on: 03/31/2019 04:59 PM   Modules accepted: Orders

## 2019-03-31 NOTE — Telephone Encounter (Signed)
Copied from [place] [id number]. Topic: General - Other >> Mar 31, 2019 12:03 PM Carolyn Stare wrote:  Pt wife call to say Dr Regis Bill told him if he thought he might need an antibiotic to call back and she would send him one in, pt would like to have that RX called in     [place]

## 2019-03-31 NOTE — Telephone Encounter (Signed)
Pt states that eye is red under eye lid and has been runing and very sore

## 2019-03-31 NOTE — Telephone Encounter (Signed)
Please document clinical status   asap

## 2019-03-31 NOTE — Telephone Encounter (Signed)
Sent in electronically .  

## 2019-04-08 NOTE — Progress Notes (Signed)
Virtual Visit via Video Note  I connected with@ on 04/09/19 at 10:00 AM EDT by a video enabled telemedicine application and verified that I am speaking with the correct person using two identifiers. Location patient: home Location provider: home office Persons participating in the virtual visit: patient, provider ( and spuse) at  subsequent visit   [place] national recommendations  regarding COVID 19 pandemic   video visit is advised over in office visit for this patient.  Discussed the limitations of evaluation and management by telemedicine and  availability of in person appointments. The patient agreed to proceed.   HPI: [name] Presents for video visit for his Medicare wellness and medication management.  He is generally well at this time his blepharitis left lower eyelid is much better on doxycycline.  He is followed by cardiology status post CABG blood pressure controlled.  HLD was unable to tolerate statins and is recently put on Sadia a few times a week.  He needs refills for AcipHex which she has been on for a number of years for heartburn ever since he was young and Flomax which significantly helps his urinary frequency.   Hearing: Down in some frequencies no dramatic changes had full hearing testing about 2 years ago not too bad Vision:  No limitations at present . Last eye check UTD Safety:  Has smoke detector and wears seat belts.  N. No excess sun exposure. Sees dentist regularly.  Somewhat overdue over a year ago. Falls: no Advance directive :  Reviewed  Has hcpoa per wife  Memory: Felt to be good  , no concern from her or her family. Depression: No anhedonia unusual crying or depressive symptoms Nutrition: Eats well balanced diet; adequate calcium and vitamin D. No swallowing chewing problems. Injury: no major injuries in the last six months. Other healthcare providers:  Reviewed today . Social:  Lives with spouse married. Helps take care of grandchildren    No recnet travel to [place]  Preventive parameters: up-to-date  Reviewed  shingrix in future  ADLS:   There are no problems or need for assistance  driving, feeding, obtaining food, dressing, toileting and bathing, managing money using phone.  is independent. EXERCISE/ HABITS  Per week    30 min recumbent bike  Each night as possible   No tobacco    etohrare  Updated care teams    Health Maintenance  Topic Date Due  . Flu Shot  07/26/2019  . Tetanus Vaccine  08/17/2025  . Pneumonia vaccines  Completed  .    ROS: See pertinent positives and negatives per HPI.  Past Medical History:  Diagnosis Date  . Abdominal pain, right lateral 02/21/2012   progressive.    . ALLERGIC RHINITIS 07/23/2007  . Allergy   . Arthritis   . Atrial fibrillation ([place]) 07/06/2009  . BACK PAIN 11/02/2009  . BACK PAIN, LUMBAR 10/25/2010  . BENIGN PROSTATIC HYPERTROPHY 07/25/2007  . 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 CAD 01/22/2009  . Cancer ([place])    skin  . 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  . Cataract   . COLONIC POLYPS, HX OF 10/18/2007  . Cough 10/25/2010  . DIZZINESS 01/22/2009  . DYSPNEA ON EXERTION 08/21/2008  . ECHOCARDIOGRAM, ABNORMAL 08/19/2007  . GERD 07/25/2007  . Heart murmur   . HERNIA 07/06/2009  . HX, PERSONAL, MALIGNANCY, SKIN NEC 10/18/2007  . HYPERGLYCEMIA, BORDERLINE 12/31/2007  . HYPERLIPIDEMIA 07/25/2007  . HYPERTENSION 07/23/2007  . Inguinal hernia   . LUMBAR RADICULOPATHY, RIGHT 10/18/2007  .  MUSCLE PAIN 01/22/2009  . Nausea   . NEURITIS 10/25/2010  . Palpitations 05/09/2010  . RHINITIS, CHRONIC 05/11/2010  . RUQ PAIN 01/22/2009  . Sinus polyp   . UNS ADVRS EFF OTH RX MEDICINAL&BIOLOGICAL SBSTNC 04/15/2008  . 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 VITAMIN D DEFICIENCY 11/02/2009  . Voice strain     Past Surgical History:  Procedure Laterality Date  . BACK SURGERY    . basal cell and melanoma removed     face  . BONY PELVIS SURGERY    . CATARACT EXTRACTION     BIL  . COLONOSCOPY    .  CORONARY ARTERY BYPASS GRAFT  2009  . HERNIA REPAIR  7 times   inguinal  . LAPAROSCOPIC CHOLECYSTECTOMY  03/03/2013  . LUMBAR SPINE SURGERY    . POLYPECTOMY    . TONSILLECTOMY      Family History  Problem Relation Age of Onset  . Lung cancer Mother   . Cancer Mother   . Heart attack Father   . Hypertension Father   . Alcohol abuse Father   . Heart disease Father   . Breast cancer Sister   . Cancer Sister        breast  . Alcohol abuse Brother   . Hypertension Brother   . Alcohol abuse Brother   . Hypertension Brother   . Alcohol abuse Brother   . Hypertension Brother   . Kidney disease Sister   . Colon cancer Neg Hx   . Esophageal cancer Neg Hx   . Rectal cancer Neg Hx   . Stomach cancer Neg Hx    Social History   Socioeconomic History  . Marital status: Married    Spouse name: Not on file  . Number of children: Not on file  . Years of education: Not on file  . Highest education level: Not on file  Occupational History  . Not on file  Social Needs  . Financial resource strain: Not on file  . Food insecurity:    Worry: Not on file    Inability: Not on file  . Transportation needs:    Medical: Not on file    Non-medical: Not on file  Tobacco Use  . Smoking status: Former Smoker    Types: Cigarettes    Last attempt to quit: 04/02/1964    Years since quitting: 55.0  . Smokeless tobacco: Never Used  . Tobacco comment: in early 20's he quit  Substance and Sexual Activity  . Alcohol use: Yes    Comment: socially  . Drug use: No  . Sexual activity: Not on file  Lifestyle  . Physical activity:    Days per week: Not on file    Minutes per session: Not on file  . Stress: Not on file  Relationships  . Social connections:    Talks on phone: Not on file    Gets together: Not on file    Attends religious service: Not on file    Active member of club or organization: Not on file    Attends meetings of clubs or organizations: Not on file    Relationship status:  Not on file  Other Topics Concern  . Not on file  Social History Narrative   Retired [employer]   Married   Non smoker some etoh   Travels a lot               Current Outpatient Medications:  .  aspirin 81 MG tablet,  Take 81 mg by mouth daily.  , Disp: , Rfl:  .  Cholecalciferol (VITAMIN D3 PO), Take 25 mg daily by mouth., Disp: , Rfl:  .  clobetasol (OLUX) 0.05 % topical foam, APPLY TO AFFECTED AREA TWICE A DAY, Disp: , Rfl: 10 .  Coenzyme Q10 100 MG TABS, Take 1 tablet by mouth daily., Disp: , Rfl:  .  DHA-EPA-Vitamin E (OMEGA-3 COMPLEX PO), Take 1,200 mg by mouth 2 (two) times daily. ProOmega LDL, Disp: , Rfl:  .  doxycycline (VIBRA-TABS) 100 MG tablet, Take 1 tablet (100 mg total) by mouth 2 (two) times daily. For eye lid infection, Disp: 20 tablet, Rfl: 0 .  erythromycin ophthalmic ointment, Place 1 application into the left eye 4 (four) times daily. After. compresses, Disp: 3.5 g, Rfl: 1 .  ezetimibe (ZETIA) 10 MG tablet, Take 1 tablet (10 mg total) by mouth daily., Disp: 30 tablet, Rfl: 9 .  losartan (COZAAR) 25 MG tablet, Take 2 tablets (50 mg total) by mouth daily., Disp: 180 tablet, Rfl: 2 .  magnesium oxide (MAG-OX) 400 MG tablet, Take 400 mg daily by mouth., Disp: , Rfl:  .  meclizine (ANTIVERT) 25 MG tablet, Take 25 mg by mouth 3 (three) times daily as needed for dizziness or nausea. , Disp: , Rfl: 0 .  metoprolol tartrate (LOPRESSOR) 50 MG tablet, TAKE 1/2 TABLET BY MOUTH IN THE MORNING AND 1 TABLET BY MOUTH IN THE EVENIG, Disp: 135 tablet, Rfl: 1 .  MULTIPLE VITAMIN PO, Take 1 tablet by mouth daily. , Disp: , Rfl:  .  MYRBETRIQ 25 MG TB24 tablet, Take 25 mg by mouth daily., Disp: , Rfl: 0 .  nitroGLYCERIN (NITROSTAT) 0.4 MG SL tablet, PLACE 1 TABLET UNDER TONGUE EVERY 5 MINUTES AS NEEDED FOR CHEST PAIN, Disp: 25 tablet, Rfl: 3 .  Omega-3 Fatty Acids (FISH OIL ADULT GUMMIES PO), Take 1 tablet by mouth daily., Disp: , Rfl:  .  RABEprazole (ACIPHEX) 20 MG tablet, Take 1  tablet (20 mg total) by mouth daily., Disp: 90 tablet, Rfl: 3 .  tamsulosin (FLOMAX) 0.4 MG CAPS capsule, Take 1 capsule (0.4 mg total) by mouth daily., Disp: 90 capsule, Rfl: 3  Current Facility-Administered Medications:  .  0.9 %  sodium chloride infusion, 500 mL, Intravenous, Continuous, Milus Banister, MD  EXAM:  VITALS per patient if applicable:  BP was reported 129/79 this am  And weight was 200#  GENERAL: alert, oriented, appears well and in no acute distress  HEENT: atraumatic, conjunttiva clear, no obvious abnormalities on inspection of external nose and ears  Left lower eye lid minimally pink and much improved   NECK: normal movements of the head and neck  LUNGS: on inspection no signs of respiratory distress, breathing rate appears normal, no obvious gross SOB, gasping or wheezing  CV: no obvious cyanosis  MS: moves all visible extremities without noticeable abnormality  PSYCH/NEURO: pleasant and cooperative, no obvious depression or anxiety, speech and thought processing grossly intact Lab Results  Component Value Date   WBC 7.1 02/19/2019   HGB 15.5 02/19/2019   HCT 43.1 02/19/2019   PLT 224 02/19/2019   GLUCOSE 112 (H) 02/19/2019   CHOL 189 02/19/2019   TRIG 80 02/19/2019   HDL 42 02/19/2019   LDLDIRECT 187.2 03/31/2008   LDLCALC 131 (H) 02/19/2019   ALT 20 02/19/2019   AST 17 02/19/2019   NA 141 02/19/2019   K 4.4 02/19/2019   CL 105 02/19/2019   CREATININE 0.83  02/19/2019   BUN 16 02/19/2019   CO2 21 02/19/2019   TSH 1.480 10/30/2017   PSA 1.49 02/21/2019   INR 1.1 (H) 01/25/2012   HGBA1C 5.8 02/20/2017    ASSESSMENT AND PLAN:  Discussed the following assessment and plan:  Medicare annual wellness visit, subsequent  Medication management  S/P CABG (coronary artery bypass graft)  Infected eye lid  BPH with urinary obstruction  Urinary frequency  Gastroesophageal reflux disease, esophagitis presence not specified  shingrix when safe  and possible  Had zoster vaccine in 2015   Labs done in feb ok but ldl not at goal   On zetia trial   bg up slightly   May fu with hg a1c at some point  And have cards managin ht and lipids  Will refill  meds as requested .  Advise get audiology check when safe and   Dentist  Can get hc advance diretive in record when possible   spouse and then [place] daughter   Counseled about  Diabetes prevention  Diet etc   Continued covid 19 prevention exposures  As is high medium  risk   Expectant management and discussion of plan and treatment with patient with opportunity to ask questions and all were answered. The patient agreed with the plan and demonstrated an understanding of the instructions.   The patient was advised to call back or seek an in-person evaluation if having concerns     Yearly check up  Or as needed   ( other care from   Specialists ) Shanon Ace, MD

## 2019-04-09 ENCOUNTER — Encounter: Payer: Self-pay | Admitting: Internal Medicine

## 2019-04-09 ENCOUNTER — Other Ambulatory Visit: Payer: Self-pay

## 2019-04-09 ENCOUNTER — Ambulatory Visit (INDEPENDENT_AMBULATORY_CARE_PROVIDER_SITE_OTHER): Payer: Medicare Other | Admitting: Internal Medicine

## 2019-04-09 DIAGNOSIS — Z951 Presence of aortocoronary bypass graft: Secondary | ICD-10-CM

## 2019-04-09 DIAGNOSIS — H019 Unspecified inflammation of eyelid: Secondary | ICD-10-CM | POA: Diagnosis not present

## 2019-04-09 DIAGNOSIS — N138 Other obstructive and reflux uropathy: Secondary | ICD-10-CM

## 2019-04-09 DIAGNOSIS — R350 Frequency of micturition: Secondary | ICD-10-CM

## 2019-04-09 DIAGNOSIS — Z79899 Other long term (current) drug therapy: Secondary | ICD-10-CM

## 2019-04-09 DIAGNOSIS — Z0000 Encounter for general adult medical examination without abnormal findings: Secondary | ICD-10-CM | POA: Diagnosis not present

## 2019-04-09 DIAGNOSIS — N401 Enlarged prostate with lower urinary tract symptoms: Secondary | ICD-10-CM

## 2019-04-09 DIAGNOSIS — K219 Gastro-esophageal reflux disease without esophagitis: Secondary | ICD-10-CM | POA: Diagnosis not present

## 2019-04-09 MED ORDER — TAMSULOSIN HCL 0.4 MG PO CAPS: 0.4000 mg | ORAL_CAPSULE | Freq: Every day | ORAL | 3 refills | Status: DC

## 2019-04-09 MED ORDER — RABEPRAZOLE SODIUM 20 MG PO TBEC: 20.0000 mg | DELAYED_RELEASE_TABLET | Freq: Every day | ORAL | 3 refills | Status: DC

## 2019-05-02 ENCOUNTER — Encounter: Payer: Self-pay | Admitting: Internal Medicine

## 2019-05-02 ENCOUNTER — Ambulatory Visit (INDEPENDENT_AMBULATORY_CARE_PROVIDER_SITE_OTHER): Payer: Medicare Other | Admitting: Internal Medicine

## 2019-05-02 ENCOUNTER — Other Ambulatory Visit: Payer: Self-pay

## 2019-05-02 DIAGNOSIS — R040 Epistaxis: Secondary | ICD-10-CM

## 2019-05-02 NOTE — Progress Notes (Signed)
   Virtual Visit via Telephone Note  I connected with@ on 05/02/19 at  3:30 PM EDT by telephone and verified that I am speaking with the correct person using two identifiers.   Pt aware of the limitations, risks, security and privacy concerns of performing an evaluation and management service by telephone and the availability of in person appointments. I also discussed with the patient that there may be a patient responsible charge related to this service. The patient expressed understanding and agreed to proceed.  Location patient: home Location provider: work  office Participants present for the call: patient, provider Patient did not have a visit in the prior 7 days to address this/these issue(s).   History of Present Illness: Onset over past weeks of 7- 8 right  Only nose bleeds easy to stop with pressure but  Comes out of the bleu . No fever  Has chronic  sinus issues ane we rx for blepharitis x 2 with oral antibiotics    No fever  No pain and no localized congestion  . Sinus pain   no unusual bleeding  Noted   Has seen ent  In [place] in past for there issues    Observations/Objective: Patient sounds cheerful and well on the phone. I do not appreciate any SOB. Speech and thought processing are grossly intact. Patient reported vitals: was ok last weeks  bp no up   Assessment and Plan: Recurrent epistaxis right  - Plan: Ambulatory referral to ENT saline nose spray  Up to 4 x per day for moisturize  Suspect anterior    Epistaxis  No  Other alarm sx     Follow Up Instructions:  See above   99441 5-10 [id number] 11-20 99443 21-30 I did not refer this patient for an OV in the next 24 hours for this/these issue(s).  I discussed the assessment and treatment plan with the patient. The patient was provided an opportunity to ask questions and all were answered. The patient agreed with the plan and demonstrated an understanding of the instructions.   The patient was advised to call back or  seek an in-person evaluation if the symptoms worsen or if the condition fails to improve as anticipated.  I provided 11 minutes of non-face-to-face time during this encounter.   Shanon Ace, MD

## 2019-05-05 DIAGNOSIS — R040 Epistaxis: Secondary | ICD-10-CM | POA: Diagnosis not present

## 2019-05-09 ENCOUNTER — Telehealth: Payer: Self-pay

## 2019-05-09 NOTE — Telephone Encounter (Signed)
Patient got dizzy in the middle of the night. Patient states ENT put him on a nasal spray (AYR no drip) and she would like to know if this could have caused the dizziness.   Please advise.

## 2019-05-09 NOTE — Telephone Encounter (Signed)
Discussed results per Dr. Regis Bill with the patients wife. She expressed understanding.

## 2019-05-09 NOTE — Telephone Encounter (Signed)
Doubt this is  A saline spray.   But wonder if could be vertigo  ?   Make sure BP  And pulse is ok.

## 2019-05-09 NOTE — Telephone Encounter (Signed)
bp and pulse is very good . If from vertigo   Can be from inner ear   And  quick movements of head     Would of course take  precautions to not fall   If not short lived   Or  any fever cp sob weakness  Of limbs speech difficulties   Falling  Then seek emergent care  /call triage team.   If having recurrent vertigo   Can see the ENT again  For this  A new problem .

## 2019-05-09 NOTE — Telephone Encounter (Signed)
Spoke with patient's wife, she gave the following BP and pule readings. She would like to know what she should do if this happens again.   138/82 and 74-right arm  127/81 and 86 -left arm

## 2019-05-27 ENCOUNTER — Telehealth: Payer: Self-pay | Admitting: *Deleted

## 2019-05-27 NOTE — Telephone Encounter (Signed)
Virtual Visit Pre-Appointment Phone Call  "(Name), I am calling you today to discuss your upcoming appointment. We are currently trying to limit exposure to the virus that causes COVID-19 by seeing patients at home rather than in the office."  1. "What is the BEST phone number to call the day of the visit?" - include this in appointment notes  2. "Do you have or have access to (through a family member/friend) a smartphone with video capability that we can use for your visit?" a. If yes - list this number in appt notes as "cell" (if different from BEST phone #) and list the appointment type as a VIDEO visit in appointment notes b. If no - list the appointment type as a PHONE visit in appointment notes  3. Confirm consent - "In the setting of the current Covid19 crisis, you are scheduled for a (phone or video) visit with your provider on (Wednesday, June 3 ) at (9:30 am ).  Just as we do with many in-office visits, in order for you to participate in this visit, we must obtain consent.  If you'd like, I can send this to your mychart (if signed up) or email for you to review.  Otherwise, I can obtain your verbal consent now.  All virtual visits are billed to your insurance company just like a normal visit would be.  By agreeing to a virtual visit, we'd like you to understand that the technology does not allow for your provider to perform an examination, and thus may limit your provider's ability to fully assess your condition. If your provider identifies any concerns that need to be evaluated in person, we will make arrangements to do so.  Finally, though the technology is pretty good, we cannot assure that it will always work on either your or our end, and in the setting of a video visit, we may have to convert it to a phone-only visit.  In either situation, we cannot ensure that we have a secure connection.  Are you willing to proceed?" STAFF: Did the patient verbally acknowledge consent to telehealth  visit? Document YES/NO here: YES.  4. Advise patient to be prepared - "Two hours prior to your appointment, go ahead and check your blood pressure, pulse, oxygen saturation, and your weight (if you have the equipment to check those) and write them all down. When your visit starts, your provider will ask you for this information. If you have an Apple Watch or Kardia device, please plan to have heart rate information ready on the day of your appointment. Please have a pen and paper handy nearby the day of the visit as well."  5. Give patient instructions for MyChart download to smartphone OR [email or web address] as below if video visit (depending on what platform provider is using)  6. Inform patient they will receive a phone call 15 minutes prior to their appointment time (may be from unknown caller ID) so they should be prepared to answer    TELEPHONE CALL NOTE  OZAN MACLAY has been deemed a candidate for a follow-up tele-health visit to limit community exposure during the Covid-19 pandemic. I spoke with the patient via phone to ensure availability of phone/video source, confirm preferred email & phone number, and discuss instructions and expectations.  I reminded BLANDON OFFERDAHL to be prepared with any vital sign and/or heart rhythm information that could potentially be obtained via home monitoring, at the time of his visit. I reminded DELON REVELO to expect  a phone call prior to his visit.  Truth Wolaver Avanell Shackleton 05/27/2019 8:26 AM   INSTRUCTIONS FOR DOWNLOADING THE MYCHART APP TO SMARTPHONE  - The patient must first make sure to have activated MyChart and know their login information - If Apple, go to [employer] and type in MyChart in the search bar and download the app. If Android, ask patient to go to [employer] and type in [place] in the search bar and download the app. The app is free but as with any other app downloads, their phone may require them to verify saved payment  information or Apple/Android password.  - The patient will need to then log into the app with their MyChart username and password, and select [facility] as their healthcare provider to link the account. When it is time for your visit, go to the MyChart app, find appointments, and click Begin Video Visit. Be sure to Select Allow for your device to access the Microphone and Camera for your visit. You will then be connected, and your provider will be with you shortly.  **If they have any issues connecting, or need assistance please contact MyChart service desk [phone])**  **If using a computer, in order to ensure the best quality for their visit they will need to use either of the following Internet Browsers: [employer], or Google Chrome**  IF USING DOXIMITY or DOXY.ME - The patient will receive a link just prior to their visit by text.     FULL LENGTH CONSENT FOR TELE-HEALTH VISIT   I hereby voluntarily request, consent and authorize [facility] and its employed or contracted physicians, physician assistants, nurse practitioners or other licensed health care professionals (the Practitioner), to provide me with telemedicine health care services (the "Services") as deemed necessary by the treating Practitioner. I acknowledge and consent to receive the Services by the Practitioner via telemedicine. I understand that the telemedicine visit will involve communicating with the Practitioner through live audiovisual communication technology and the disclosure of certain medical information by electronic transmission. I acknowledge that I have been given the opportunity to request an in-person assessment or other available alternative prior to the telemedicine visit and am voluntarily participating in the telemedicine visit.  I understand that I have the right to withhold or withdraw my consent to the use of telemedicine in the course of my care at any time, without affecting my right  to future care or treatment, and that the Practitioner or I may terminate the telemedicine visit at any time. I understand that I have the right to inspect all information obtained and/or recorded in the course of the telemedicine visit and may receive copies of available information for a reasonable fee.  I understand that some of the potential risks of receiving the Services via telemedicine include:  Marland Kitchen Delay or interruption in medical evaluation due to technological equipment failure or disruption; . Information transmitted may not be sufficient (e.g. poor resolution of images) to allow for appropriate medical decision making by the Practitioner; and/or  . In rare instances, security protocols could fail, causing a breach of personal health information.  Furthermore, I acknowledge that it is my responsibility to provide information about my medical history, conditions and care that is complete and accurate to the best of my ability. I acknowledge that Practitioner's advice, recommendations, and/or decision may be based on factors not within their control, such as incomplete or inaccurate data provided by me or distortions of diagnostic images or specimens that may  result from electronic transmissions. I understand that the practice of medicine is not an exact science and that Practitioner makes no warranties or guarantees regarding treatment outcomes. I acknowledge that I will receive a copy of this consent concurrently upon execution via email to the email address I last provided but may also request a printed copy by calling the office of [facility].    I understand that my insurance will be billed for this visit.   I have read or had this consent read to me. . I understand the contents of this consent, which adequately explains the benefits and risks of the Services being provided via telemedicine.  . I have been provided ample opportunity to ask questions regarding this consent and the Services  and have had my questions answered to my satisfaction. . I give my informed consent for the services to be provided through the use of telemedicine in my medical care  By participating in this telemedicine visit I agree to the above.

## 2019-05-27 NOTE — Progress Notes (Signed)
Telehealth Visit     Virtual Visit via Video Note   This visit type was conducted due to national recommendations for restrictions regarding the COVID-19 Pandemic (e.g. social distancing) in an effort to limit this patient's exposure and mitigate transmission in our community.  Due to his co-morbid illnesses, this patient is at least at moderate risk for complications without adequate follow up.  This format is felt to be most appropriate for this patient at this time.  All issues noted in this document were discussed and addressed.  A limited physical exam was performed with this format.  Please refer to the patient's chart for his consent to telehealth for [facility].   Evaluation Performed:  Follow-up visit  This visit type was conducted due to national recommendations for restrictions regarding the COVID-19 Pandemic (e.g. social distancing).  This format is felt to be most appropriate for this patient at this time.  All issues noted in this document were discussed and addressed.  No physical exam was performed (except for noted visual exam findings with Video Visits).  Please refer to the patient's chart (MyChart message for video visits and phone note for telephone visits) for the patient's consent to telehealth for [facility].  Date:  05/28/2019   ID:  [name], DOB Dec 05, 1941, MRN [id number]  Patient Location:  Home  Provider location:   Home  PCP:  Regis Bill Standley Brooking, MD  Cardiologist:  Gillian Shields Electrophysiologist:  None   Chief Complaint:  Follow up  History of Present Illness:    JASMEET GEHL is a 78 y.o. male who presents via audio/video conferencing for a telehealth visit today.  Seen for Dr. Johnsie Cancel.Primarily sees me.   He has a history of known CAD with priorCABG in 2009. Other issues include HLD, BPH and HTN. He had repeat cath back in 2013 with patent grafts noted. Lesion in a small unbypassed OM to be managed medically.He has had muscle  aches/pains/weakness with statin therapy. He has been on altered doses of Lipitor in the past with poor tolerance reported.   I have seen him the past several visits. He has had more in the way of palpitations. Cautious increase in beta blocker due to 1st degree AV block. Echo updated last November of 2018 as well as a Holter - these studies were stable.He has had issues with vertigo and back pain - has been found to have spinal stenosis - gabapentin and relafen made him dizzy. Last seen in February - he was a little more short of breath - not very active. Weight was up. He was using some type of supplement for his cholesterol.   The patient does not have symptoms concerning for COVID-19 infection (fever, chills, cough, or new shortness of breath).   Seen today via [place]. He has consented for this visit. Wife - [name] - she augments most of the history. He has had a virtual visit with PCP. Labs from February noted. He is not walking much but he is riding his bike at least 3 times a week for 1/2 hour. He is limited by his back. He is doing Keto "a little bit". His weight is down. Short of breath "every once in a while" - can happen after talking for a good bit - but not with his bike riding. Rare twinge in his chest - no NTG. He stopped his Zetia - felt "terrible" - so he stopped - he is back on his natural supplement. He has had  an eye infection. No syncope. Not really lightheaded or dizzy. No falls - he does stagger due to back pain. He admits he sleeps a lot. Probably too much - just sits in the chair and watches the TV and then falls off. He is tolerating his medicines.    Past Medical History:  Diagnosis Date  . Abdominal pain, right lateral 02/21/2012   progressive.    . ALLERGIC RHINITIS 07/23/2007  . Allergy   . Arthritis   . Atrial fibrillation ([place]) 07/06/2009  . BACK PAIN 11/02/2009  . BACK PAIN, LUMBAR 10/25/2010  . BENIGN PROSTATIC HYPERTROPHY 07/25/2007  . 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 CAD 01/22/2009  . Cancer ([place])    skin  . 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  . Cataract   . COLONIC POLYPS, HX OF 10/18/2007  . Cough 10/25/2010  . DIZZINESS 01/22/2009  . DYSPNEA ON EXERTION 08/21/2008  . ECHOCARDIOGRAM, ABNORMAL 08/19/2007  . GERD 07/25/2007  . Heart murmur   . HERNIA 07/06/2009  . HX, PERSONAL, MALIGNANCY, SKIN NEC 10/18/2007  . HYPERGLYCEMIA, BORDERLINE 12/31/2007  . HYPERLIPIDEMIA 07/25/2007  . HYPERTENSION 07/23/2007  . Inguinal hernia   . LUMBAR RADICULOPATHY, RIGHT 10/18/2007  . MUSCLE PAIN 01/22/2009  . Nausea   . NEURITIS 10/25/2010  . Palpitations 05/09/2010  . RHINITIS, CHRONIC 05/11/2010  . RUQ PAIN 01/22/2009  . Sinus polyp   . UNS ADVRS EFF OTH RX MEDICINAL&BIOLOGICAL SBSTNC 04/15/2008  . 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 VITAMIN D DEFICIENCY 11/02/2009  . Voice strain    Past Surgical History:  Procedure Laterality Date  . BACK SURGERY    . basal cell and melanoma removed     face  . BONY PELVIS SURGERY    . CATARACT EXTRACTION     BIL  . COLONOSCOPY    . CORONARY ARTERY BYPASS GRAFT  2009  . HERNIA REPAIR  7 times   inguinal  . LAPAROSCOPIC CHOLECYSTECTOMY  03/03/2013  . LUMBAR SPINE SURGERY    . POLYPECTOMY    . TONSILLECTOMY       Current Meds  Medication Sig  . aspirin 81 MG tablet Take 81 mg by mouth daily.    . Cholecalciferol (VITAMIN D3 PO) Take 25 mg daily by mouth.  . clobetasol (OLUX) 0.05 % topical foam APPLY TO AFFECTED AREA TWICE A DAY  . Coenzyme Q10 100 MG TABS Take 1 tablet by mouth daily.  . DHA-EPA-Vitamin E (OMEGA-3 COMPLEX PO) Take 1,200 mg by mouth 3 (three) times daily. ProOmega LDL   . losartan (COZAAR) 25 MG tablet Take 2 tablets (50 mg total) by mouth daily.  . magnesium oxide (MAG-OX) 400 MG tablet Take 400 mg daily by mouth.  . metoprolol tartrate (LOPRESSOR) 50 MG tablet TAKE 1/2 TABLET BY MOUTH IN THE MORNING AND 1 TABLET BY MOUTH IN THE EVENIG  . MULTIPLE VITAMIN PO Take 1 tablet by mouth daily.   Marland Kitchen  MYRBETRIQ 25 MG TB24 tablet Take 25 mg by mouth daily.  . nitroGLYCERIN (NITROSTAT) 0.4 MG SL tablet PLACE 1 TABLET UNDER TONGUE EVERY 5 MINUTES AS NEEDED FOR CHEST PAIN  . Omega-3 Fatty Acids (FISH OIL ADULT GUMMIES PO) Take 1 tablet by mouth daily.  . RABEprazole (ACIPHEX) 20 MG tablet Take 1 tablet (20 mg total) by mouth daily.  . tamsulosin (FLOMAX) 0.4 MG CAPS capsule Take 1 capsule (0.4 mg total) by mouth daily.   Current Facility-Administered Medications for the 05/28/19 encounter (Telemedicine) with Burtis Junes, NP  Medication  . 0.9 %  sodium chloride infusion     Allergies:   Propoxyphene n-acetaminophen and Iodinated diagnostic agents   Social History   Tobacco Use  . Smoking status: Former Smoker    Types: Cigarettes    Last attempt to quit: 04/02/1964    Years since quitting: 55.1  . Smokeless tobacco: Never Used  . Tobacco comment: in early 20's he quit  Substance Use Topics  . Alcohol use: Yes    Comment: socially  . Drug use: No     Family Hx: The patient's family history includes Alcohol abuse in his brother, brother, brother, and father; Breast cancer in his sister; Cancer in his mother and sister; Heart attack in his father; Heart disease in his father; Hypertension in his brother, brother, brother, and father; Kidney disease in his sister; Lung cancer in his mother. There is no history of Colon cancer, Esophageal cancer, Rectal cancer, or Stomach cancer.  ROS:   Please see the history of present illness.   All other systems reviewed are negative.    Objective:    Vital Signs:  BP 137/81   Pulse 66   Ht 5' 7.5" (1.715 m)   Wt 197 lb (89.4 kg)   BMI 30.40 kg/m    Wt Readings from Last 3 Encounters:  05/28/19 197 lb (89.4 kg)  02/19/19 206 lb (93.4 kg)  01/09/19 203 lb 11.2 oz (92.4 kg)    Alert male in no acute distress. Not short of breath with conversation. He looks good - very appropriate in his responses.     Labs/Other Tests and Data  Reviewed:    Lab Results  Component Value Date   WBC 7.1 02/19/2019   HGB 15.5 02/19/2019   HCT 43.1 02/19/2019   PLT 224 02/19/2019   GLUCOSE 112 (H) 02/19/2019   CHOL 189 02/19/2019   TRIG 80 02/19/2019   HDL 42 02/19/2019   LDLDIRECT 187.2 03/31/2008   LDLCALC 131 (H) 02/19/2019   ALT 20 02/19/2019   AST 17 02/19/2019   NA 141 02/19/2019   K 4.4 02/19/2019   CL 105 02/19/2019   CREATININE 0.83 02/19/2019   BUN 16 02/19/2019   CO2 21 02/19/2019   TSH 1.480 10/30/2017   PSA 1.49 02/21/2019   INR 1.1 (H) 01/25/2012   HGBA1C 5.8 02/20/2017     BNP (last 3 results) No results for input(s): BNP in the last 8760 hours.  ProBNP (last 3 results) Recent Labs    02/19/19 1103  PROBNP 139      Prior CV studies:    The following studies were reviewed today:  HolterStudy Highlights11/2018  NSR PAC;s / PVC;s No significant arrhythmias PVC;s account for less than 1% total beats    EchoStudy Conclusions11/2018  - Left ventricle: The cavity size was normal. There was moderate concentric hypertrophy. Systolic function was normal. The estimated ejection fraction was in the range of 60% to 65%. Wall motion was normal; there were no regional wall motion abnormalities. Doppler parameters are consistent with abnormal left ventricular relaxation (grade 1 diastolic dysfunction). Doppler parameters are consistent with elevated ventricular end-diastolic filling pressure. - Aortic valve: Trileaflet; normal thickness leaflets. There was no regurgitation. - Aortic root: The aortic root was normal in size. - Mitral valve: There was mild regurgitation. - Left atrium: The atrium was moderately dilated. - Right ventricle: Systolic function was normal. - Right atrium: The atrium was normal in size. - Tricuspid valve: There was trivial regurgitation. -  Pulmonary arteries: Systolic pressure was within the normal range. - Inferior vena cava: The vessel  was normal in size. The respirophasic diameter changes were in the normal range (= 50%), consistent with normal central venous pressure. - Pericardium, extracardiac: There was no pericardial effusion.    ASSESSMENT & PLAN:    1.Ischemic heart disease - remote CABG -he has no active symptoms. Would favor continued medical management. Encouraged him to keep riding his bike. His weight is down a few pounds which is good. No changes made today.   2.Murmur -echo from November 2018 noted.No cardinal symptoms - may need to consider updating later this year.   3. Palpitations- not endorsed. He had a stable Holter from 2018.   4. 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 Will need EKG on return. No changes made today.    5. HTN- BP looks good today. No changes made.   6. HLD- he has stopped the Zetia - back on his "natural supplement" per his choice. Lipids from February noted - LDL not at goal at 131. He has not tolerated statin in the past.   7. COVID-19 Education: The signs and symptoms of COVID-19 were discussed with the patient and how to seek care for testing (follow up with PCP or arrange E-visit).  The importance of social distancing, staying at home, hand hygiene and wearing a mask when out in public were discussed today.  Patient Risk:   After full review of this patient's clinical status, I feel that they are at least moderate risk at this time.  Time:   Today, I have spent 12 minutes with the patient with telehealth technology discussing the above issues.     Medication Adjustments/Labs and Tests Ordered: Current medicines are reviewed at length with the patient today.  Concerns regarding medicines are outlined above.   Tests Ordered: No orders of the defined types were placed in this encounter.   Medication Changes: No orders of the defined types were placed in this encounter.   Disposition:  FU with me in the office in 4 months with fasting  labs.    Patient is agreeable to this plan and will call if any problems develop in the interim.   Amie Critchley, NP  05/28/2019 9:37 AM    [place]

## 2019-05-28 ENCOUNTER — Encounter: Payer: Self-pay | Admitting: Nurse Practitioner

## 2019-05-28 ENCOUNTER — Other Ambulatory Visit: Payer: Self-pay

## 2019-05-28 ENCOUNTER — Telehealth (INDEPENDENT_AMBULATORY_CARE_PROVIDER_SITE_OTHER): Payer: Medicare Other | Admitting: Nurse Practitioner

## 2019-05-28 VITALS — BP 137/81 | HR 66 | Ht 67.5 in | Wt 197.0 lb

## 2019-05-28 DIAGNOSIS — I259 Chronic ischemic heart disease, unspecified: Secondary | ICD-10-CM

## 2019-05-28 DIAGNOSIS — R0600 Dyspnea, unspecified: Secondary | ICD-10-CM

## 2019-05-28 DIAGNOSIS — R011 Cardiac murmur, unspecified: Secondary | ICD-10-CM

## 2019-05-28 DIAGNOSIS — Z7189 Other specified counseling: Secondary | ICD-10-CM | POA: Diagnosis not present

## 2019-05-28 NOTE — Patient Instructions (Addendum)
After Visit Summary:  We will be checking the following labs today - NONE   Medication Instructions:    Continue with your current medicines.    If you need a refill on your cardiac medications before your next appointment, please call your pharmacy.     Testing/Procedures To Be Arranged:  N/A  Follow-Up:   See me in 4 months in the office with fasting labs.     At [facility], you and your health needs are our priority.  As part of our continuing mission to provide you with exceptional heart care, we have created designated Provider Care Teams.  These Care Teams include your primary Cardiologist (physician) and Advanced Practice Providers (APPs -  Physician Assistants and Nurse Practitioners) who all work together to provide you with the care you need, when you need it.  Special Instructions:  . Stay safe, stay home, wash your hands for at least 20 seconds and wear a mask when out in public.  . It was good to talk with you today.    Call the [place] office at [phone] if you have any questions, problems or concerns.

## 2019-06-10 ENCOUNTER — Ambulatory Visit: Payer: Medicare Other | Admitting: Nurse Practitioner

## 2019-07-06 ENCOUNTER — Other Ambulatory Visit: Payer: Self-pay | Admitting: Nurse Practitioner

## 2019-08-03 ENCOUNTER — Other Ambulatory Visit: Payer: Self-pay | Admitting: Cardiovascular Disease

## 2019-08-06 ENCOUNTER — Telehealth: Payer: Self-pay | Admitting: Internal Medicine

## 2019-08-06 MED ORDER — LOSARTAN POTASSIUM 25 MG PO TABS: 50.0000 mg | ORAL_TABLET | Freq: Every day | ORAL | 2 refills | Status: DC

## 2019-08-06 NOTE — Telephone Encounter (Signed)
See request °

## 2019-08-06 NOTE — Telephone Encounter (Signed)
Medication Refill - Medication: losartan (COZAAR) 25 MG tablet  Has the patient contacted their pharmacy? Yes - but may have been sent to incorrect provider (Agent: If no, request that the patient contact the pharmacy for the refill.) (Agent: If yes, when and what did the pharmacy advise?)  Preferred Pharmacy (with phone number or street name):  [id number] - [place], [place] - [address] [phone] (Phone) [phone] (Fax)     Agent: Please be advised that RX refills may take up to 3 business days. We ask that you follow-up with your pharmacy.

## 2019-08-06 NOTE — Telephone Encounter (Signed)
Refill was sent in 

## 2019-09-23 DIAGNOSIS — Z23 Encounter for immunization: Secondary | ICD-10-CM | POA: Diagnosis not present

## 2019-10-07 ENCOUNTER — Ambulatory Visit: Payer: Medicare Other | Admitting: Nurse Practitioner

## 2019-10-09 NOTE — Progress Notes (Signed)
CARDIOLOGY OFFICE NOTE  Date:  10/13/2019    [name] Date of Birth: 02/02/1941 Medical Record [id number]  PCP:  [name], MD  Cardiologist:  Gillian Shields   Chief Complaint  Patient presents with  . Follow-up    Seen for Dr. Johnsie Cancel    History of Present Illness: [name] is a 78 y.o. male who presents today for a 4 month check.  Seen for Dr. Johnsie Cancel.Primarily sees me.  He has a history of known CAD with priorCABG in 2009. Other issues include HLD, BPH and HTN. He had repeat cath back in 2013 with patent grafts noted. Lesion in a small unbypassed OM to be managed medically.He has had muscle aches/pains/weakness with statin therapy. He has been on altered doses of Lipitor in the past with poor tolerance reported.  I have seen him the past several visits. He has had more in the way of palpitations. Cautious increase in beta blocker due to 1st degree AV block. Echo updated last November of 2018 as well as a Holter - these studies were stable.He has had issues with vertigo and back pain - has been found to have spinal stenosis - gabapentin and relafen made him dizzy. Last seen in the office back in February - he was a little more short of breath - not very active. Weight was up. He was using some type of supplement for his cholesterol. We did a telehealth visit back in June - he was not walking as much but was riding his bike. Trying Keto. Mostly limited by his back. No longer on Zetia due to feeing "terrible". Probably sleeping too much and watching too much TV with the [place] pandemic.   The patient does not have symptoms concerning for COVID-19 infection (fever, chills, cough, or new shortness of breath).   Comes in today. Here alone. He brought me a long note from his wife - she has lots of concerns. He is still sleeping a lot during the day - sitting to much and watching TV. Balance is poor. Voice gets hoars and then has abrupt loss of his voice.  Lots of allergies - asking for ENT referral. Taking supplements for his cholesterol now - does not tolerate anything else. No chest pain noted. He is "humped" over and has trouble walking. Wishes there was some exercise he could do. No recent labs noted. Says "getting old is hard". Turning 78 next month. He stays tired. He will get short of breath with activity. She has put him on some calcium with vitamin D - wondering if his level is too low.   Past Medical History:  Diagnosis Date  . Abdominal pain, right lateral 02/21/2012   progressive.    . ALLERGIC RHINITIS 07/23/2007  . Allergy   . Arthritis   . Atrial fibrillation ([place]) 07/06/2009  . BACK PAIN 11/02/2009  . BACK PAIN, LUMBAR 10/25/2010  . BENIGN PROSTATIC HYPERTROPHY 07/25/2007  . 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 CAD 01/22/2009  . Cancer ([place])    skin  . 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  . Cataract   . COLONIC POLYPS, HX OF 10/18/2007  . Cough 10/25/2010  . DIZZINESS 01/22/2009  . DYSPNEA ON EXERTION 08/21/2008  . ECHOCARDIOGRAM, ABNORMAL 08/19/2007  . GERD 07/25/2007  . Heart murmur   . HERNIA 07/06/2009  . HX, PERSONAL, MALIGNANCY, SKIN NEC 10/18/2007  . HYPERGLYCEMIA, BORDERLINE 12/31/2007  . HYPERLIPIDEMIA 07/25/2007  . HYPERTENSION 07/23/2007  . Inguinal hernia   .  LUMBAR RADICULOPATHY, RIGHT 10/18/2007  . MUSCLE PAIN 01/22/2009  . Nausea   . NEURITIS 10/25/2010  . Palpitations 05/09/2010  . RHINITIS, CHRONIC 05/11/2010  . RUQ PAIN 01/22/2009  . Sinus polyp   . UNS ADVRS EFF OTH RX MEDICINAL&BIOLOGICAL SBSTNC 04/15/2008  . 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 VITAMIN D DEFICIENCY 11/02/2009  . Voice strain     Past Surgical History:  Procedure Laterality Date  . BACK SURGERY    . basal cell and melanoma removed     face  . BONY PELVIS SURGERY    . CATARACT EXTRACTION     BIL  . COLONOSCOPY    . CORONARY ARTERY BYPASS GRAFT  2009  . HERNIA REPAIR  7 times   inguinal  . LAPAROSCOPIC CHOLECYSTECTOMY  03/03/2013  . LUMBAR  SPINE SURGERY    . POLYPECTOMY    . TONSILLECTOMY       Medications: Current Meds  Medication Sig  . aspirin 81 MG tablet Take 81 mg by mouth daily.    . Cholecalciferol (VITAMIN D3 PO) Take 1,500 mg by mouth daily.   . clobetasol (OLUX) 0.05 % topical foam APPLY TO AFFECTED AREA TWICE A DAY  . Coenzyme Q10 100 MG TABS Take 1 tablet by mouth daily.  . DHA-EPA-Vitamin E (OMEGA-3 COMPLEX PO) Take 1,200 mg by mouth 3 (three) times daily. ProOmega LDL   . losartan (COZAAR) 25 MG tablet Take 2 tablets (50 mg total) by mouth daily.  . magnesium oxide (MAG-OX) 400 MG tablet Take 400 mg daily by mouth.  . metoprolol tartrate (LOPRESSOR) 50 MG tablet TAKE 1/2 TABLET BY MOUTH IN THE MORNING AND 1 TABLET BY MOUTH IN THE EVENIG  . MULTIPLE VITAMIN PO Take 1 tablet by mouth daily.   Marland Kitchen MYRBETRIQ 25 MG TB24 tablet Take 25 mg by mouth daily.  . nitroGLYCERIN (NITROSTAT) 0.4 MG SL tablet PLACE 1 TABLET UNDER TONGUE EVERY 5 MINUTES AS NEEDED FOR CHEST PAIN  . Omega-3 Fatty Acids (FISH OIL ADULT GUMMIES PO) Take 1 tablet by mouth daily.  . RABEprazole (ACIPHEX) 20 MG tablet Take 1 tablet (20 mg total) by mouth daily.  . tamsulosin (FLOMAX) 0.4 MG CAPS capsule Take 1 capsule (0.4 mg total) by mouth daily.   Current Facility-Administered Medications for the 10/13/19 encounter (Office Visit) with Burtis Junes, NP  Medication  . 0.9 %  sodium chloride infusion     Allergies: Allergies  Allergen Reactions  . Propoxyphene N-Acetaminophen Nausea And Vomiting  . Iodinated Diagnostic Agents Swelling    Pt developed slight lt upper lip swelling only to one side about 15 minutes post IV contrast injection. No other symptoms.     Social History: The patient  reports that he quit smoking about 55 years ago. His smoking use included cigarettes. He has never used smokeless tobacco. He reports current alcohol use. He reports that he does not use drugs.   Family History: The patient's family history  includes Alcohol abuse in his brother, brother, brother, and father; Breast cancer in his sister; Cancer in his mother and sister; Heart attack in his father; Heart disease in his father; Hypertension in his brother, brother, brother, and father; Kidney disease in his sister; Lung cancer in his mother.   Review of Systems: Please see the history of present illness.   All other systems are reviewed and negative.   Physical Exam: VS:  BP 120/68   Pulse 68   Ht 5' 7.5" (1.715 m)  Wt 203 lb 9.6 oz (92.4 kg)   SpO2 94%   BMI 31.42 kg/m  .  BMI Body mass index is 31.42 kg/m.  Wt Readings from Last 3 Encounters:  10/13/19 203 lb 9.6 oz (92.4 kg)  05/28/19 197 lb (89.4 kg)  02/19/19 206 lb (93.4 kg)    General: Pleasant. Elderly. Alert and in no acute distress.  HEENT: Normal.  Neck: Supple, no JVD, carotid bruits, or masses noted.  Cardiac: Regular rate and rhythm. Outflow murmur noted.  Respiratory:  Lungs are clear to auscultation bilaterally with normal work of breathing.  GI: Soft and nontender.  MS: No deformity or atrophy. Gait and ROM intact. Using a cane. He is very kyphotic.  Skin: Warm and dry. Color is normal.  Neuro:  Strength and sensation are intact and no gross focal deficits noted.  Psych: Alert, appropriate and with normal affect.   LABORATORY DATA:  EKG:  EKG is ordered today. This demonstrates NSR with 1st degree AV block.  Lab Results  Component Value Date   WBC 7.1 02/19/2019   HGB 15.5 02/19/2019   HCT 43.1 02/19/2019   PLT 224 02/19/2019   GLUCOSE 112 (H) 02/19/2019   CHOL 189 02/19/2019   TRIG 80 02/19/2019   HDL 42 02/19/2019   LDLDIRECT 187.2 03/31/2008   LDLCALC 131 (H) 02/19/2019   ALT 20 02/19/2019   AST 17 02/19/2019   NA 141 02/19/2019   K 4.4 02/19/2019   CL 105 02/19/2019   CREATININE 0.83 02/19/2019   BUN 16 02/19/2019   CO2 21 02/19/2019   TSH 1.480 10/30/2017   PSA 1.49 02/21/2019   INR 1.1 (H) 01/25/2012   HGBA1C 5.8  02/20/2017     BNP (last 3 results) No results for input(s): BNP in the last 8760 hours.  ProBNP (last 3 results) Recent Labs    02/19/19 1103  PROBNP 139     Other Studies Reviewed Today:   HolterStudy Highlights11/2018  NSR PAC;s / PVC;s No significant arrhythmias PVC;s account for less than 1% total beats    EchoStudy Conclusions11/2018  - Left ventricle: The cavity size was normal. There was moderate concentric hypertrophy. Systolic function was normal. The estimated ejection fraction was in the range of 60% to 65%. Wall motion was normal; there were no regional wall motion abnormalities. Doppler parameters are consistent with abnormal left ventricular relaxation (grade 1 diastolic dysfunction). Doppler parameters are consistent with elevated ventricular end-diastolic filling pressure. - Aortic valve: Trileaflet; normal thickness leaflets. There was no regurgitation. - Aortic root: The aortic root was normal in size. - Mitral valve: There was mild regurgitation. - Left atrium: The atrium was moderately dilated. - Right ventricle: Systolic function was normal. - Right atrium: The atrium was normal in size. - Tricuspid valve: There was trivial regurgitation. - Pulmonary arteries: Systolic pressure was within the normal range. - Inferior vena cava: The vessel was normal in size. The respirophasic diameter changes were in the normal range (= 50%), consistent with normal central venous pressure. - Pericardium, extracardiac: There was no pericardial effusion.    ASSESSMENT & PLAN:    1.Multitude of somatic complaints - will check labs today - update his echo - he is going to reach out to PCP about possible physical therapy. I suspect a lot of this is from deconditioning.   2. Ischemic heart disease - remote CABG -no active chest pain but no longer active at all - he is trying to ride his bike a few  times a week.   3. Outflow  murmur - will get echo updated.   4. 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   5. HTN - BP is fine.   6. HLD - not on any RX med - just supplements. Does not tolerate. Recheck lab today and will check vitamin D level too.   7. Chronic pain syndrome - unchanged but certainly impacts his overall well being.   8. Hoarseness/allegies - chronic - he wants to see ENT - referral made for him today.   9. COVID-19 Education: The signs and symptoms of COVID-19 were discussed with the patient and how to seek care for testing (follow up with PCP or arrange E-visit).  The importance of social distancing, staying at home, hand hygiene and wearing a mask when out in public were discussed today.  Current medicines are reviewed with the patient today.  The patient does not have concerns regarding medicines other than what has been noted above.  The following changes have been made:  See above.  Labs/ tests ordered today include:    Orders Placed This Encounter  Procedures  . Basic metabolic panel  . CBC  . Hepatic function panel  . Lipid panel  . Pro b natriuretic peptide (BNP)  . Magnesium  . Vitamin D 1,25 dihydroxy  . Ambulatory referral to ENT  . ECHOCARDIOGRAM COMPLETE     Disposition:   FU with me in 3 t4 months tentatively. Will see how his studies turn out.    Patient is agreeable to this plan and will call if any problems develop in the interim.   SignedTruitt Merle, NP  10/13/2019 3:18 PM  [facility] [address] [place], [place]  [address] Phone: [phone] Fax: [phone]

## 2019-10-13 ENCOUNTER — Other Ambulatory Visit: Payer: Self-pay | Admitting: *Deleted

## 2019-10-13 ENCOUNTER — Other Ambulatory Visit: Payer: Self-pay

## 2019-10-13 ENCOUNTER — Ambulatory Visit (INDEPENDENT_AMBULATORY_CARE_PROVIDER_SITE_OTHER): Payer: Medicare Other | Admitting: Nurse Practitioner

## 2019-10-13 ENCOUNTER — Encounter: Payer: Self-pay | Admitting: Nurse Practitioner

## 2019-10-13 VITALS — BP 120/68 | HR 68 | Ht 67.5 in | Wt 203.6 lb

## 2019-10-13 DIAGNOSIS — R0600 Dyspnea, unspecified: Secondary | ICD-10-CM

## 2019-10-13 DIAGNOSIS — Z79899 Other long term (current) drug therapy: Secondary | ICD-10-CM | POA: Diagnosis not present

## 2019-10-13 DIAGNOSIS — R7989 Other specified abnormal findings of blood chemistry: Secondary | ICD-10-CM | POA: Diagnosis not present

## 2019-10-13 DIAGNOSIS — R011 Cardiac murmur, unspecified: Secondary | ICD-10-CM

## 2019-10-13 DIAGNOSIS — E559 Vitamin D deficiency, unspecified: Secondary | ICD-10-CM | POA: Diagnosis not present

## 2019-10-13 DIAGNOSIS — R490 Dysphonia: Secondary | ICD-10-CM | POA: Diagnosis not present

## 2019-10-13 DIAGNOSIS — E7849 Other hyperlipidemia: Secondary | ICD-10-CM | POA: Diagnosis not present

## 2019-10-13 DIAGNOSIS — R6889 Other general symptoms and signs: Secondary | ICD-10-CM | POA: Diagnosis not present

## 2019-10-13 DIAGNOSIS — R5383 Other fatigue: Secondary | ICD-10-CM

## 2019-10-13 DIAGNOSIS — I259 Chronic ischemic heart disease, unspecified: Secondary | ICD-10-CM

## 2019-10-13 NOTE — Addendum Note (Signed)
Addended by: Eulis Foster on: 10/13/2019 03:32 PM   Modules accepted: Orders

## 2019-10-13 NOTE — Patient Instructions (Addendum)
After Visit Summary:  We will be checking the following labs today - BMET, CBC, HPF, Lipids, BNP, TSH, Mag level and vit D level   Medication Instructions:    Continue with your current medicines.    If you need a refill on your cardiac medications before your next appointment, please call your pharmacy.     Testing/Procedures To Be Arranged:  We are going to update your echocardiogram to make sure your heart is pumping ok.   Follow-Up:   See me in 3 to 4 months.   I have put in a referral to ENT about your voice/allergies, etc. They should be in touch - it is with Dr. Jerrell Belfast.     At [facility], you and your health needs are our priority.  As part of our continuing mission to provide you with exceptional heart care, we have created designated Provider Care Teams.  These Care Teams include your primary Cardiologist (physician) and Advanced Practice Providers (APPs -  Physician Assistants and Nurse Practitioners) who all work together to provide you with the care you need, when you need it.  Special Instructions:  . Stay safe, stay home, wash your hands for at least 20 seconds and wear a mask when out in public.  . It was good to talk with you today.    Call the [place] office at [phone] if you have any questions, problems or concerns.    Arlene,   I am doing lots of lab work on [place]. We will be calling you with those results in the next day or so. We will also get an ultrasound of his heart updated. Can you call and ask Dr. Regis Bill about getting [facility] some physical therapy? This might help with his balance. I suspect a lot of symptoms are from deconditioning and inability to be active - especially with his chronic back pain/issues.   Thanks, Laiza Veenstra

## 2019-10-14 LAB — CBC
Hematocrit: 45.7 % (ref 37.5–51.0)
Hemoglobin: 15.8 g/dL (ref 13.0–17.7)
MCH: 32.6 pg (ref 26.6–33.0)
MCHC: 34.6 g/dL (ref 31.5–35.7)
MCV: 94 fL (ref 79–97)
Platelets: 235 10*3/uL (ref 150–450)
RBC: 4.85 x10E6/uL (ref 4.14–5.80)
RDW: 13.1 % (ref 11.6–15.4)
WBC: 8.1 10*3/uL (ref 3.4–10.8)

## 2019-10-14 LAB — HEPATIC FUNCTION PANEL
ALT: 19 IU/L (ref 0–44)
AST: 13 IU/L (ref 0–40)
Albumin: 4.4 g/dL (ref 3.7–4.7)
Alkaline Phosphatase: 72 IU/L (ref 39–117)
Bilirubin Total: 0.6 mg/dL (ref 0.0–1.2)
Bilirubin, Direct: 0.13 mg/dL (ref 0.00–0.40)
Total Protein: 6.2 g/dL (ref 6.0–8.5)

## 2019-10-14 LAB — LIPID PANEL
Chol/HDL Ratio: 4.7 ratio (ref 0.0–5.0)
Cholesterol, Total: 180 mg/dL (ref 100–199)
HDL: 38 mg/dL — ABNORMAL LOW (ref >39)
LDL Chol Calc (NIH): 119 mg/dL — ABNORMAL HIGH (ref 0–99)
Triglycerides: 126 mg/dL (ref 0–149)
VLDL Cholesterol Cal: 23 mg/dL (ref 5–40)

## 2019-10-14 LAB — BASIC METABOLIC PANEL
BUN/Creatinine Ratio: 25 — ABNORMAL HIGH (ref 10–24)
BUN: 19 mg/dL (ref 8–27)
CO2: 25 mmol/L (ref 20–29)
Calcium: 9.6 mg/dL (ref 8.6–10.2)
Chloride: 103 mmol/L (ref 96–106)
Creatinine, Ser: 0.75 mg/dL — ABNORMAL LOW (ref 0.76–1.27)
GFR calc Af Amer: 101 mL/min/{1.73_m2} (ref >59)
GFR calc non Af Amer: 87 mL/min/{1.73_m2} (ref >59)
Glucose: 89 mg/dL (ref 65–99)
Potassium: 4.4 mmol/L (ref 3.5–5.2)
Sodium: 141 mmol/L (ref 134–144)

## 2019-10-14 LAB — TSH: TSH: 1.64 u[IU]/mL (ref 0.450–4.500)

## 2019-10-14 LAB — VITAMIN D 25 HYDROXY (VIT D DEFICIENCY, FRACTURES): Vit D, 25-Hydroxy: 29.3 ng/mL — ABNORMAL LOW (ref 30.0–100.0)

## 2019-10-14 LAB — MAGNESIUM: Magnesium: 2.2 mg/dL (ref 1.6–2.3)

## 2019-10-14 LAB — PRO B NATRIURETIC PEPTIDE: NT-Pro BNP: 110 pg/mL (ref 0–486)

## 2019-10-14 NOTE — Addendum Note (Signed)
Addended by: Burtis Junes on: 10/14/2019 03:04 PM   Modules accepted: Orders

## 2019-10-15 ENCOUNTER — Telehealth: Payer: Self-pay

## 2019-10-15 DIAGNOSIS — R2689 Other abnormalities of gait and mobility: Secondary | ICD-10-CM

## 2019-10-15 DIAGNOSIS — R269 Unspecified abnormalities of gait and mobility: Secondary | ICD-10-CM

## 2019-10-15 DIAGNOSIS — M545 Low back pain, unspecified: Secondary | ICD-10-CM

## 2019-10-15 NOTE — Telephone Encounter (Signed)
Please advise 

## 2019-10-15 NOTE — Telephone Encounter (Signed)
Pt wife is informed that referral has been placed

## 2019-10-15 NOTE — Telephone Encounter (Signed)
I have reviewed cardiology note   Ok to refer for  PT for  Balance problems gait abnormality  and  Low back pain   Then   Plan  ROV in person  Or  Virtual  After completing therapy

## 2019-10-15 NOTE — Addendum Note (Signed)
Addended by: Mendel Ryder on: 10/15/2019 09:29 AM   Modules accepted: Orders

## 2019-10-15 NOTE — Telephone Encounter (Signed)
Copied from [place] [id number] [id number]. Topic: Referral - Request for Referral >> Oct 15, 2019  9:47 AM Alanda Slim E wrote: Has patient seen PCP for this complaint? No, Pt seen his cardiologist and was advised to call Dr. Regis Bill for a referral  *If NO, is insurance requiring patient see PCP for this issue before PCP can refer them? Referral for which specialty: Physical Therapy  Preferred provider/office:  Reason for referral: for balance and leaning forward

## 2019-10-16 ENCOUNTER — Other Ambulatory Visit: Payer: Self-pay

## 2019-10-16 ENCOUNTER — Ambulatory Visit (HOSPITAL_COMMUNITY): Payer: Medicare Other | Attending: Cardiology

## 2019-10-16 DIAGNOSIS — I259 Chronic ischemic heart disease, unspecified: Secondary | ICD-10-CM | POA: Insufficient documentation

## 2019-10-16 DIAGNOSIS — R0600 Dyspnea, unspecified: Secondary | ICD-10-CM

## 2019-10-16 DIAGNOSIS — R011 Cardiac murmur, unspecified: Secondary | ICD-10-CM | POA: Insufficient documentation

## 2019-10-16 DIAGNOSIS — E7849 Other hyperlipidemia: Secondary | ICD-10-CM | POA: Insufficient documentation

## 2019-10-17 ENCOUNTER — Telehealth: Payer: Self-pay | Admitting: *Deleted

## 2019-10-17 MED ORDER — VITAMIN D3 125 MCG (5000 UT) PO CAPS: 5000.0000 [IU] | ORAL_CAPSULE | Freq: Every day | ORAL | 3 refills | Status: DC

## 2019-10-17 NOTE — Telephone Encounter (Signed)
I s/w both pt and his wife have been made aware of recommendations per Truitt Merle, NP in regards to natural supplements. I advised per NP would prefer the pt not take the natural supps and take a multivitamin and increase activity. Both the pt and his wife are agreeable to plan of care and thanked me for the call and Truitt Merle, NP for her help.

## 2019-10-17 NOTE — Telephone Encounter (Signed)
DPR ok to s/w pt's wife who has been made aware of echo results by phone. Pt's wife states they s/w PCP and physical therapy is being arranged. Pt's wife then asked if the lab results were in. I reviewed lab results as well and went over recommendations to start vitamin D 5000 IU daily. I will send in Rx for vit D. Pt's wife would like to know what are some good ways to help pt's trig and LDL. I suggested on trigs to watch carbs, limit simple carbs, try more complex carbs though in moderation still. LDL watch fats (trans, sat) polyunsaterated and monounsaterated are our good fats, watch fried, fatty foods. Pt's wife was very thankful for taking the time to go over all results. She did want to make sure ok with Truitt Merle, NP ok for the pt to still take his natural supplements. I assured her I will send my message to Cecille Rubin and her CMA Andee Poles and they will call back with recommendations about natural supplements. Results have been sent to [place]. The patient has been notified of the result and verbalized understanding.  All questions (if any) were answered. Julaine Hua, CMA 10/17/2019 8:45 AM

## 2019-10-17 NOTE — Telephone Encounter (Signed)
I tried to call pt to go over recommendations in regards to natural supplements. Line was busy, could not lmom.

## 2019-10-17 NOTE — Telephone Encounter (Signed)
I am not a big fan of natural supplements - I would just take a multi vitamin.   I think the key for him will be activity.   Thanks Cecille Rubin

## 2019-10-23 ENCOUNTER — Other Ambulatory Visit: Payer: Self-pay

## 2019-10-23 ENCOUNTER — Encounter: Payer: Self-pay | Admitting: Physical Therapy

## 2019-10-23 ENCOUNTER — Ambulatory Visit: Payer: Medicare Other | Attending: Internal Medicine | Admitting: Physical Therapy

## 2019-10-23 DIAGNOSIS — M6281 Muscle weakness (generalized): Secondary | ICD-10-CM

## 2019-10-23 DIAGNOSIS — M545 Low back pain, unspecified: Secondary | ICD-10-CM

## 2019-10-23 DIAGNOSIS — R2689 Other abnormalities of gait and mobility: Secondary | ICD-10-CM | POA: Diagnosis not present

## 2019-10-23 DIAGNOSIS — G8929 Other chronic pain: Secondary | ICD-10-CM | POA: Diagnosis not present

## 2019-10-23 DIAGNOSIS — R262 Difficulty in walking, not elsewhere classified: Secondary | ICD-10-CM

## 2019-10-23 MED ORDER — PERFLUTREN LIPID MICROSPHERE
1.0000 mL | INTRAVENOUS | Status: AC | PRN
  Administered 2019-10-16: 2 mL via INTRAVENOUS

## 2019-10-23 NOTE — Therapy (Signed)
[facility] [address] [place], [name], [address] Phone: [phone]   Fax:  [phone]  Physical Therapy Evaluation  Patient Details  Name: [name] MRN: [id number] Date of Birth: 05-May-1941 Referring Provider (PT): Shanon Ace MD   Encounter Date: 10/23/2019  PT End of Session - 10/23/19 0941    Visit Number  1    Number of Visits  16    Date for PT Re-Evaluation  12/17/19    PT Start Time  0930    PT Stop Time  [id number]    PT Time Calculation (min)  45 min    Activity Tolerance  Patient tolerated treatment well    Behavior During Therapy  [facility] for tasks assessed/performed       Past Medical History:  Diagnosis Date  . Abdominal pain, right lateral 02/21/2012   progressive.    . ALLERGIC RHINITIS 07/23/2007  . Allergy   . Arthritis   . Atrial fibrillation ([place]) 07/06/2009  . BACK PAIN 11/02/2009  . BACK PAIN, LUMBAR 10/25/2010  . BENIGN PROSTATIC HYPERTROPHY 07/25/2007  . 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 CAD 01/22/2009  . Cancer ([place])    skin  . 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  . Cataract   . COLONIC POLYPS, HX OF 10/18/2007  . Cough 10/25/2010  . DIZZINESS 01/22/2009  . DYSPNEA ON EXERTION 08/21/2008  . ECHOCARDIOGRAM, ABNORMAL 08/19/2007  . GERD 07/25/2007  . Heart murmur   . HERNIA 07/06/2009  . HX, PERSONAL, MALIGNANCY, SKIN NEC 10/18/2007  . HYPERGLYCEMIA, BORDERLINE 12/31/2007  . HYPERLIPIDEMIA 07/25/2007  . HYPERTENSION 07/23/2007  . Inguinal hernia   . LUMBAR RADICULOPATHY, RIGHT 10/18/2007  . MUSCLE PAIN 01/22/2009  . Nausea   . NEURITIS 10/25/2010  . Palpitations 05/09/2010  . RHINITIS, CHRONIC 05/11/2010  . RUQ PAIN 01/22/2009  . Sinus polyp   . UNS ADVRS EFF OTH RX MEDICINAL&BIOLOGICAL SBSTNC 04/15/2008  . 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 VITAMIN D DEFICIENCY 11/02/2009  . Voice strain     Past Surgical History:  Procedure Laterality Date  . BACK SURGERY    . basal cell and  melanoma removed     face  . BONY PELVIS SURGERY    . CATARACT EXTRACTION     BIL  . COLONOSCOPY    . CORONARY ARTERY BYPASS GRAFT  2009  . HERNIA REPAIR  7 times   inguinal  . LAPAROSCOPIC CHOLECYSTECTOMY  03/03/2013  . LUMBAR SPINE SURGERY    . POLYPECTOMY    . TONSILLECTOMY      There were no vitals filed for this visit.   Subjective Assessment - 10/23/19 0933    Subjective  Pt arriving to therapy reportig low back pain which has been going on for years. Pt reporting 4/10 pain today upon arrival at rest.    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 (L5 distribution), multiple co-morbidities: HTN, CAD, A-fib, lumbar radiculopathy, dyspnea, CABG 2009    Limitations  Sitting;Lifting;Standing;Walking    How long can you sit comfortably?  1-2 hours    How long can you stand comfortably?  not long ( 5 minutes)    How long can you walk comfortably?  5 minutes    Patient Stated Goals  Be able to walk without staggering    Currently in Pain?  Yes    Pain Score  4     Pain Location  Back  Pain Orientation  Lower    Pain Descriptors / Indicators  Sharp    Pain Type  Chronic pain    Pain Radiating Towards  none reported    Pain Onset  More than a month ago    Pain Frequency  Intermittent    Aggravating Factors   weather, transfers, sit to stand, getting in and out of the bed    Pain Relieving Factors  over the counter meds    Effect of Pain on Daily Activities  difficutly with bed mobility, unsteady when I'm walking and taking a shower         [facility] PT Assessment - 10/23/19 0001      Assessment   Medical Diagnosis  R26.89 Balance, R26.9 Gait abnormality, M54.5 LBP    Referring Provider (PT)  Shanon Ace MD    Hand Dominance  Right    Prior Therapy  yes, in past for back pain      Precautions   Precautions  None      Restrictions   Weight Bearing Restrictions  No      Balance Screen   Has the patient  fallen in the past 6 months  No    Is the patient reluctant to leave their home because of a fear of falling?   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    Leisure  relax      Observation/Other Assessments   Focus on Therapeutic Outcomes ([place])   deferred due to LBP and balance deficits      Posture/Postural Control   Posture/Postural Control  Postural limitations    Postural Limitations  Rounded Shoulders;Forward head;Decreased lumbar lordosis;Increased thoracic kyphosis;Posterior pelvic tilt;Flexed trunk      ROM / Strength   AROM / PROM / Strength  Strength      Strength   Strength Assessment Site  Hip    Right/Left Hip  Right;Left    Right Hip Flexion  4-/5    Right Hip Extension  4-/5    Right Hip ABduction  4-/5    Right Hip ADduction  4-/5    Left Hip Flexion  4+/5    Left Hip Extension  4+/5    Left Hip ABduction  4+/5    Left Hip ADduction  4+/5      Flexibility   Soft Tissue Assessment /Muscle Length  yes    Hamstrings  R: 50, L 58      Palpation   Palpation comment  TTP on lumbar paraspinals L.>R      Transfers   Five time sit to stand comments   28.2 seconds using UE support      Ambulation/Gait   Gait Pattern  Step-through pattern;Decreased arm swing - right;Decreased arm swing - left;Decreased stride length;Shuffle    Gait Comments  forward trunk and head posturing      Standardized Balance Assessment   Standardized Balance Assessment  Berg Balance Test      Berg Balance Test   Sit to Stand  Able to stand  independently using hands  Standing Unsupported  Able to stand safely 2 minutes    Sitting with Back Unsupported but Feet Supported on Floor or Stool   Able to sit 2 minutes under supervision    Stand to Sit  Controls descent by using hands    Transfers  Able to transfer safely, definite need of hands    Standing Unsupported with Eyes Closed  Able to stand 10 seconds with supervision    Standing Unsupported with Feet Together  Needs help to attain position but able to stand for 30 seconds with feet together    From Standing, Reach Forward with Outstretched Arm  Can reach forward >5 cm safely (2")    From Standing Position, Pick up Object from Floor  Unable to pick up shoe, but reaches 2-5 cm (1-2") from shoe and balances independently    From Standing Position, Turn to Look Behind Over each Shoulder  Turn sideways only but maintains balance    Turn 360 Degrees  Able to turn 360 degrees safely but slowly    Standing Unsupported, Alternately Place Feet on Step/Stool  Able to complete 4 steps without aid or supervision    Standing Unsupported, One Foot in Front  Able to take small step independently and hold 30 seconds    Standing on One Leg  Tries to lift leg/unable to hold 3 seconds but remains standing independently    Total Score  33                Objective measurements completed on examination: See above findings.              PT Education - 10/23/19 1243    Education Details  HEP, postural correction    Person(s) Educated  Patient    Methods  Explanation;Other (comment);Demonstration;Verbal cues    Comprehension  Verbalized understanding;Returned demonstration;Verbal cues required;Need further instruction       PT Short Term Goals - 10/23/19 1246      PT SHORT TERM GOAL #1   Title  Pt will be independent in his HEP.    Baseline  initial HEP issued today.    Time  4    Period  Weeks    Status  New    Target Date  11/18/19      PT SHORT TERM GOAL #2   Title  Pt will be able to perform sit to stand without UE support.    Baseline  requires UE support    Time  4    Period  Weeks    Status  New         PT Long Term Goals - 10/23/19 1247      PT LONG TERM GOAL #1   Title  Pt will improve his BERG balance score to >/= 40/56.    Baseline  BERG: 10/29/20202 :    Time  8    Period  Weeks    Status  New    Target Date  12/18/19      PT LONG TERM GOAL #2   Title  Pt will improve his 5 time sit to stand to </= 15 seconds.    Baseline  28 seconds using UE support    Time  8    Period  Weeks    Status  New      PT LONG TERM GOAL #3   Title  Pt will be able to amb >/= 15 minutes without rest break for community activites.  Baseline  5 minutes before having to sit and rest from pain and fatigue    Time  8    Period  Weeks    Status  New    Target Date  12/18/19             Plan - 10/23/19 0959    Clinical Impression Statement  Pt presenting to therpay today reporting 4/10 low back pain. Pt also with balance deficits and weakness noted in bilateral LE's and core. Pt with postural deficits of forward flexed trunk, rounded shoulders, rounder thoracic spine, decreased lumbar lordosis, and posterior pelvic tilt. Pt  with shuffeling gait pattern currently using no device. Pt would benefit from skilled PT    Personal Factors and Comorbidities  Comorbidity 3+    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), multiple co-morbidities: HTN, CAD, A-fib, lumbar radiculopathy, dyspnea, CABG 2009    Examination-Activity Limitations  Bathing;Stand;Stairs;Squat;Lift;Transfers;Sit    Examination-Participation Restrictions  Other    Stability/Clinical Decision Making  Evolving/Moderate complexity    Clinical Decision Making  Moderate    Rehab Potential  Good    PT Frequency  2x / week    PT Duration  8 weeks    PT Treatment/Interventions  ADLs/Self Care Home Management;Cryotherapy;Electrical Stimulation;Moist Heat;Iontophoresis 4mg /ml Dexamethasone;Ultrasound;Gait training;Stair training;Balance training;Therapeutic  exercise;Therapeutic activities;Functional mobility training;Neuromuscular re-education;Patient/family education;Manual techniques;Passive range of motion;Dry needling;Taping    PT Next Visit Plan  Print out pt a copy of his exercises, update HEP as needed. postural correction, Nustep,  LE srengthening, gait training, balance exercises    PT Home Exercise Plan  Access Code: [email or web address] (pt was emailed his HEP, due to not being able to access printer).    Consulted and Agree with Plan of Care  Patient       Patient will benefit from skilled therapeutic intervention in order to improve the following deficits and impairments:  Abnormal gait, Pain, Postural dysfunction, Decreased strength, Impaired flexibility, Decreased balance, Difficulty walking, Cardiopulmonary status limiting activity, Decreased activity tolerance, Decreased range of motion  Visit Diagnosis: Difficulty in walking, not elsewhere classified  Chronic bilateral low back pain without sciatica  Muscle weakness (generalized)  Other abnormalities of gait and mobility     Problem List Patient Active Problem List   Diagnosis Date Noted  . Allergic conjunctivitis of both eyes 04/22/2018  . Neck pain on right side 11/03/2014  . Urinary frequency 07/27/2014  . Medicare annual wellness visit, initial 07/27/2014  . Rash and nonspecific skin eruption 07/27/2014  . Nodule, subcutaneous 07/27/2014  . Atherosclerosis of aorta ([place]) 10/25/2013  . Hyperglycemia 03/07/2013  . Status post laparoscopic cholecystectomy 03/07/2013  . 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  . Bilateral renal cysts 02/28/2012  . Hepatic cyst 02/28/2012  . Atherosclerosis 02/28/2012  . Contact dermatitis 02/28/2012  . Dermatitis 04/03/2011  . BACK PAIN, LUMBAR 10/25/2010  . NEURITIS 10/25/2010  . RHINITIS, CHRONIC 05/11/2010  . PALPITATIONS 05/09/2010  . 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  . VITAMIN  D DEFICIENCY 11/02/2009  . BACK PAIN 11/02/2009  . BLADDER NECK OBSTRUCTION 07/07/2009  . ATRIAL FIBRILLATION 07/06/2009  . CAD 01/22/2009  . MUSCLE PAIN 01/22/2009  . DYSPNEA ON EXERTION 08/21/2008  . 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 HYPERGLYCEMIA, BORDERLINE 12/31/2007  . LUMBAR RADICULOPATHY, RIGHT 10/18/2007  . HX, PERSONAL, MALIGNANCY, SKIN NEC 10/18/2007  . COLONIC POLYPS, HX OF 10/18/2007  . ECHOCARDIOGRAM,  ABNORMAL 08/19/2007  . HYPERLIPIDEMIA 07/25/2007  . GERD 07/25/2007  . BENIGN PROSTATIC HYPERTROPHY 07/25/2007  . HYPERTENSION 07/23/2007  . ALLERGIC RHINITIS 07/23/2007    Oretha Caprice, PT 10/23/2019, 12:56 PM  [facility] [address] [place], [name], [address] Phone: [phone]   Fax:  [phone]  Name: DONTREL SPRAKER MRN: [id number] Date of Birth: 1941-12-14

## 2019-10-27 ENCOUNTER — Encounter: Payer: Self-pay | Admitting: Physical Therapy

## 2019-10-27 ENCOUNTER — Ambulatory Visit: Payer: Medicare Other | Attending: Internal Medicine | Admitting: Physical Therapy

## 2019-10-27 ENCOUNTER — Other Ambulatory Visit: Payer: Self-pay

## 2019-10-27 DIAGNOSIS — M6281 Muscle weakness (generalized): Secondary | ICD-10-CM

## 2019-10-27 DIAGNOSIS — R262 Difficulty in walking, not elsewhere classified: Secondary | ICD-10-CM

## 2019-10-27 DIAGNOSIS — G8929 Other chronic pain: Secondary | ICD-10-CM | POA: Diagnosis not present

## 2019-10-27 DIAGNOSIS — M545 Low back pain, unspecified: Secondary | ICD-10-CM

## 2019-10-27 DIAGNOSIS — R2689 Other abnormalities of gait and mobility: Secondary | ICD-10-CM | POA: Insufficient documentation

## 2019-10-27 NOTE — Therapy (Signed)
[facility] [address] [place], [name], [address] Phone: [phone]   Fax:  [phone]  Physical Therapy Treatment  Patient Details  Name: [name] MRN: [id number] Date of Birth: Mar 09, 1941 Referring Provider (PT): Shanon Ace MD   Encounter Date: 10/27/2019  PT End of Session - 10/27/19 1022    Visit Number  2    Date for PT Re-Evaluation  12/17/19    PT Start Time  [id number]    PT Stop Time  1055    PT Time Calculation (min)  40 min    Activity Tolerance  Patient tolerated treatment well    Behavior During Therapy  [facility] for tasks assessed/performed       Past Medical History:  Diagnosis Date  . Abdominal pain, right lateral 02/21/2012   progressive.    . ALLERGIC RHINITIS 07/23/2007  . Allergy   . Arthritis   . Atrial fibrillation ([place]) 07/06/2009  . BACK PAIN 11/02/2009  . BACK PAIN, LUMBAR 10/25/2010  . BENIGN PROSTATIC HYPERTROPHY 07/25/2007  . 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 CAD 01/22/2009  . Cancer ([place])    skin  . 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  . Cataract   . COLONIC POLYPS, HX OF 10/18/2007  . Cough 10/25/2010  . DIZZINESS 01/22/2009  . DYSPNEA ON EXERTION 08/21/2008  . ECHOCARDIOGRAM, ABNORMAL 08/19/2007  . GERD 07/25/2007  . Heart murmur   . HERNIA 07/06/2009  . HX, PERSONAL, MALIGNANCY, SKIN NEC 10/18/2007  . HYPERGLYCEMIA, BORDERLINE 12/31/2007  . HYPERLIPIDEMIA 07/25/2007  . HYPERTENSION 07/23/2007  . Inguinal hernia   . LUMBAR RADICULOPATHY, RIGHT 10/18/2007  . MUSCLE PAIN 01/22/2009  . Nausea   . NEURITIS 10/25/2010  . Palpitations 05/09/2010  . RHINITIS, CHRONIC 05/11/2010  . RUQ PAIN 01/22/2009  . Sinus polyp   . UNS ADVRS EFF OTH RX MEDICINAL&BIOLOGICAL SBSTNC 04/15/2008  . 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 VITAMIN D DEFICIENCY 11/02/2009  . Voice strain     Past Surgical History:  Procedure Laterality Date  . BACK SURGERY    . basal cell and melanoma removed     face  .  BONY PELVIS SURGERY    . CATARACT EXTRACTION     BIL  . COLONOSCOPY    . CORONARY ARTERY BYPASS GRAFT  2009  . HERNIA REPAIR  7 times   inguinal  . LAPAROSCOPIC CHOLECYSTECTOMY  03/03/2013  . LUMBAR SPINE SURGERY    . POLYPECTOMY    . TONSILLECTOMY      There were no vitals filed for this visit.  Subjective Assessment - 10/27/19 1019    Subjective  My lower back is sore.    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 (L5 distribution), multiple co-morbidities: HTN, CAD, A-fib, lumbar radiculopathy, dyspnea, CABG 2009    Currently in Pain?  Yes    Pain Score  5     Pain Location  Back    Pain Orientation  Lower    Pain Descriptors / Indicators  Sharp    Pain Type  Chronic pain    Pain Onset  More than a month ago    Pain Frequency  Intermittent    Multiple Pain Sites  No                       [facility] Adult PT Treatment/Exercise - 10/27/19 0001      Neuro Re-ed  Neuro Re-ed Details   cues for standing posture - increased hip extension and looking straight ahead; standing on foam weight shifting side to side and turning side to side no UE support      Exercises   Exercises  Knee/Hip      Knee/Hip Exercises: Stretches   Hip Flexor Stretch  Right;Left;3 reps;20 seconds    Gastroc Stretch  Right;Left;2 reps;20 seconds   on slant board   Other Knee/Hip Stretches  lumbar flexion rolling blue ball in seated - 10x 5 sec      Knee/Hip Exercises: Aerobic   Nustep  L1 x 10 min - PT present for status update      Knee/Hip Exercises: Standing   Heel Raises  Both;20 reps   no UE   Knee Flexion  Strengthening;Right;Left;10 reps   one finger support   Hip Flexion  Stengthening;Right;Left;10 reps   no UE support     Knee/Hip Exercises: Seated   Clamshell with TheraBand  Red    Sit to Sand  10 reps;without UE support   2 sets with lumbar stretch btwn sets              PT Short Term Goals -  10/27/19 1359      PT SHORT TERM GOAL #1   Title  Pt will be independent in his HEP.    Status  Achieved        PT Long Term Goals - 10/23/19 1247      PT LONG TERM GOAL #1   Title  Pt will improve his BERG balance score to >/= 40/56.    Baseline  BERG: 10/29/20202 :    Time  8    Period  Weeks    Status  New    Target Date  12/18/19      PT LONG TERM GOAL #2   Title  Pt will improve his 5 time sit to stand to </= 15 seconds.    Baseline  28 seconds using UE support    Time  8    Period  Weeks    Status  New      PT LONG TERM GOAL #3   Title  Pt will be able to amb >/= 15 minutes without rest break for community activites.    Baseline  5 minutes before having to sit and rest from pain and fatigue    Time  8    Period  Weeks    Status  New    Target Date  12/18/19            Plan - 10/27/19 1356    Clinical Impression Statement  Pt did well at initial treatment since eval.  Pt has been doing his exercises including sit to stand 2x daily.  Pt was able to do 2 sets today with stretching between sets.  Pt will benefit from skilled PT to progress strength, balance, and posture for improved function and reduced risk of falls.    PT Treatment/Interventions  ADLs/Self Care Home Management;Cryotherapy;Electrical Stimulation;Moist Heat;Iontophoresis 4mg /ml Dexamethasone;Ultrasound;Gait training;Stair training;Balance training;Therapeutic exercise;Therapeutic activities;Functional mobility training;Neuromuscular re-education;Patient/family education;Manual techniques;Passive range of motion;Dry needling;Taping    PT Next Visit Plan  Nustep,  LE srengthening, gait training, balance exercises    PT Home Exercise Plan  Access Code: [email or web address]    Consulted and Agree with Plan of Care  Patient       Patient will benefit from skilled therapeutic intervention in order to improve the following deficits  and impairments:  Abnormal gait, Pain, Postural dysfunction, Decreased strength,  Impaired flexibility, Decreased balance, Difficulty walking, Cardiopulmonary status limiting activity, Decreased activity tolerance, Decreased range of motion  Visit Diagnosis: Difficulty in walking, not elsewhere classified  Chronic bilateral low back pain without sciatica  Muscle weakness (generalized)  Other abnormalities of gait and mobility     Problem List Patient Active Problem List   Diagnosis Date Noted  . Allergic conjunctivitis of both eyes 04/22/2018  . Neck pain on right side 11/03/2014  . Urinary frequency 07/27/2014  . Medicare annual wellness visit, initial 07/27/2014  . Rash and nonspecific skin eruption 07/27/2014  . Nodule, subcutaneous 07/27/2014  . Atherosclerosis of aorta ([place]) 10/25/2013  . Hyperglycemia 03/07/2013  . Status post laparoscopic cholecystectomy 03/07/2013  . 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  . Bilateral renal cysts 02/28/2012  . Hepatic cyst 02/28/2012  . Atherosclerosis 02/28/2012  . Contact dermatitis 02/28/2012  . Dermatitis 04/03/2011  . BACK PAIN, LUMBAR 10/25/2010  . NEURITIS 10/25/2010  . RHINITIS, CHRONIC 05/11/2010  . PALPITATIONS 05/09/2010  . 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  . VITAMIN D DEFICIENCY 11/02/2009  . BACK PAIN 11/02/2009  . BLADDER NECK OBSTRUCTION 07/07/2009  . ATRIAL FIBRILLATION 07/06/2009  . CAD 01/22/2009  . MUSCLE PAIN 01/22/2009  . DYSPNEA ON EXERTION 08/21/2008  . 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 HYPERGLYCEMIA, BORDERLINE 12/31/2007  . LUMBAR RADICULOPATHY, RIGHT 10/18/2007  . HX, PERSONAL, MALIGNANCY, SKIN NEC 10/18/2007  . COLONIC POLYPS, HX OF 10/18/2007  . ECHOCARDIOGRAM, ABNORMAL 08/19/2007  . HYPERLIPIDEMIA 07/25/2007  . GERD 07/25/2007  . BENIGN PROSTATIC HYPERTROPHY 07/25/2007  . HYPERTENSION 07/23/2007  . ALLERGIC RHINITIS 07/23/2007    Jule Ser, PT 10/27/2019, 2:00 PM  [facility] [address] [place], [name], [address] Phone: [phone]   Fax:  [phone]  Name: [name] MRN: [id number] Date of Birth: 08-Oct-1941

## 2019-10-29 ENCOUNTER — Encounter: Payer: Self-pay | Admitting: Physical Therapy

## 2019-10-29 ENCOUNTER — Ambulatory Visit: Payer: Medicare Other | Admitting: Physical Therapy

## 2019-10-29 ENCOUNTER — Other Ambulatory Visit: Payer: Self-pay

## 2019-10-29 DIAGNOSIS — R2689 Other abnormalities of gait and mobility: Secondary | ICD-10-CM

## 2019-10-29 DIAGNOSIS — R262 Difficulty in walking, not elsewhere classified: Secondary | ICD-10-CM | POA: Diagnosis not present

## 2019-10-29 DIAGNOSIS — M6281 Muscle weakness (generalized): Secondary | ICD-10-CM

## 2019-10-29 DIAGNOSIS — M545 Low back pain, unspecified: Secondary | ICD-10-CM

## 2019-10-29 DIAGNOSIS — G8929 Other chronic pain: Secondary | ICD-10-CM

## 2019-10-29 NOTE — Patient Instructions (Signed)
Access Code: [email or web address]  URL: [email or web address]  Date: 10/29/2019  Prepared by: Sherol Dade   Exercises  Seated Correct Posture - 7x weekly  Hooklying Single Knee to Chest Stretch - 5 reps - 30 seconds hold - 2x daily - 7x weekly  Seated Table Hamstring Stretch - 2 reps - 30 hold - 1x daily - 7x weekly  Sit to Stand with Counter Support - 10 reps - 3x daily - 7x weekly    [facility] [address] [place], [place] [address] Phone # [phone] Fax [phone]

## 2019-10-29 NOTE — Therapy (Signed)
[facility] [address], [place], [name], [address] Phone: [phone]   Fax:  [phone]  Physical Therapy Treatment  Patient Details  Name: [name] MRN: [id number] Date of Birth: 02/14/1941 Referring Provider (PT): Shanon Ace MD   Encounter Date: 10/29/2019  PT End of Session - 10/29/19 1057    Visit Number  3    Date for PT Re-Evaluation  12/17/19    PT Start Time  [id number]    PT Stop Time  [id number]    PT Time Calculation (min)  38 min    Activity Tolerance  Patient tolerated treatment well;No increased pain    Behavior During Therapy  WFL for tasks assessed/performed       Past Medical History:  Diagnosis Date  . Abdominal pain, right lateral 02/21/2012   progressive.    . ALLERGIC RHINITIS 07/23/2007  . Allergy   . Arthritis   . Atrial fibrillation ([place]) 07/06/2009  . BACK PAIN 11/02/2009  . BACK PAIN, LUMBAR 10/25/2010  . BENIGN PROSTATIC HYPERTROPHY 07/25/2007  . 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 CAD 01/22/2009  . Cancer ([place])    skin  . 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  . Cataract   . COLONIC POLYPS, HX OF 10/18/2007  . Cough 10/25/2010  . DIZZINESS 01/22/2009  . DYSPNEA ON EXERTION 08/21/2008  . ECHOCARDIOGRAM, ABNORMAL 08/19/2007  . GERD 07/25/2007  . Heart murmur   . HERNIA 07/06/2009  . HX, PERSONAL, MALIGNANCY, SKIN NEC 10/18/2007  . HYPERGLYCEMIA, BORDERLINE 12/31/2007  . HYPERLIPIDEMIA 07/25/2007  . HYPERTENSION 07/23/2007  . Inguinal hernia   . LUMBAR RADICULOPATHY, RIGHT 10/18/2007  . MUSCLE PAIN 01/22/2009  . Nausea   . NEURITIS 10/25/2010  . Palpitations 05/09/2010  . RHINITIS, CHRONIC 05/11/2010  . RUQ PAIN 01/22/2009  . Sinus polyp   . UNS ADVRS EFF OTH RX MEDICINAL&BIOLOGICAL SBSTNC 04/15/2008  . 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 VITAMIN D DEFICIENCY 11/02/2009  . Voice strain     Past Surgical History:  Procedure Laterality Date  . BACK SURGERY    . basal cell and melanoma  removed     face  . BONY PELVIS SURGERY    . CATARACT EXTRACTION     BIL  . COLONOSCOPY    . CORONARY ARTERY BYPASS GRAFT  2009  . HERNIA REPAIR  7 times   inguinal  . LAPAROSCOPIC CHOLECYSTECTOMY  03/03/2013  . LUMBAR SPINE SURGERY    . POLYPECTOMY    . TONSILLECTOMY      There were no vitals filed for this visit.  Subjective Assessment - 10/29/19 1022    Subjective  Pt states that his back is not bothering him much right now. He had some issues with his supine hamstring stretch where he heard a "crunching" sound in his neck and then felt dizzy and nauseous. This is improved today.    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 (L5 distribution), multiple co-morbidities: HTN, CAD, A-fib, lumbar radiculopathy, dyspnea, CABG 2009    Currently in Pain?  No/denies    Pain Onset  More than a month ago                       [facility] Adult PT Treatment/Exercise - 10/29/19 0001      Knee/Hip Exercises: Stretches   Passive Hamstring Stretch  Both;1 rep;30 seconds    Passive Hamstring Stretch Limitations  long sitting with LE off mat table     Quad Stretch  2 reps;Both;20 seconds    Quad Stretch Limitations  standing with LE on 2nd step       Knee/Hip Exercises: Aerobic   Nustep  L1 x7 min, PT present to discuss changes to HEP      Knee/Hip Exercises: Standing   Knee Flexion  Strengthening;Right;Left;10 reps    Knee Flexion Limitations  #2 ankle weight, pt instructed to maintain focus on object ahead of him    Hip Flexion  Stengthening;Right;Left;10 reps;Knee bent    Hip Flexion Limitations  #2 ankle weight, pt instructed to maintain focus on object ahead of him      Knee/Hip Exercises: Seated   Clamshell with Marga Hoots   x15 reps          Balance Exercises - 10/29/19 1052      Balance Exercises: Standing   Standing Eyes Closed  Narrow base of support (BOS);Solid surface;2 reps;20 secs     Tandem Stance  Eyes open;2 reps;20 secs    Stepping Strategy  Lateral   step over and back with single LE x10 reps each direction       PT Education - 10/29/19 1057    Education Details  adjustments to HEP; encouraged pt to notify PCP if he notices more dizziness/nausea with laying supine at home    Person(s) Educated  Patient    Methods  Explanation;Handout    Comprehension  Verbalized understanding       PT Short Term Goals - 10/27/19 1359      PT SHORT TERM GOAL #1   Title  Pt will be independent in his HEP.    Status  Achieved        PT Long Term Goals - 10/23/19 1247      PT LONG TERM GOAL #1   Title  Pt will improve his BERG balance score to >/= 40/56.    Baseline  BERG: 10/29/20202 :    Time  8    Period  Weeks    Status  New    Target Date  12/18/19      PT LONG TERM GOAL #2   Title  Pt will improve his 5 time sit to stand to </= 15 seconds.    Baseline  28 seconds using UE support    Time  8    Period  Weeks    Status  New      PT LONG TERM GOAL #3   Title  Pt will be able to amb >/= 15 minutes without rest break for community activites.    Baseline  5 minutes before having to sit and rest from pain and fatigue    Time  8    Period  Weeks    Status  New    Target Date  12/18/19            Plan - 10/29/19 1058    Clinical Impression Statement  Pt arrived with reported issues completing his HEP on the floor. He noted dizziness and nausea when laying supine without a pillow, and he was instructed to monitor this going forward and make several adjustments to his position in order to decrease the strain in his neck. Pt was able to complete progressions in standing exercise resistance without noted difficulty. PT had to provide CGA during dynamic balance at the end of today's session to prevent LOB. Pt demonstrated good understanding of his  HEP updates end of today's session.    PT Treatment/Interventions  ADLs/Self Care Home  Management;Cryotherapy;Electrical Stimulation;Moist Heat;Iontophoresis 4mg /ml Dexamethasone;Ultrasound;Gait training;Stair training;Balance training;Therapeutic exercise;Therapeutic activities;Functional mobility training;Neuromuscular re-education;Patient/family education;Manual techniques;Passive range of motion;Dry needling;Taping    PT Next Visit Plan  Nustep, LE srengthening, gait training, balance exercises    PT Home Exercise Plan  Access Code: [email or web address]    Consulted and Agree with Plan of Care  Patient       Patient will benefit from skilled therapeutic intervention in order to improve the following deficits and impairments:  Abnormal gait, Pain, Postural dysfunction, Decreased strength, Impaired flexibility, Decreased balance, Difficulty walking, Cardiopulmonary status limiting activity, Decreased activity tolerance, Decreased range of motion  Visit Diagnosis: Difficulty in walking, not elsewhere classified  Muscle weakness (generalized)  Other abnormalities of gait and mobility  Chronic bilateral low back pain without sciatica     Problem List Patient Active Problem List   Diagnosis Date Noted  . Allergic conjunctivitis of both eyes 04/22/2018  . Neck pain on right side 11/03/2014  . Urinary frequency 07/27/2014  . Medicare annual wellness visit, initial 07/27/2014  . Rash and nonspecific skin eruption 07/27/2014  . Nodule, subcutaneous 07/27/2014  . Atherosclerosis of aorta ([place]) 10/25/2013  . Hyperglycemia 03/07/2013  . Status post laparoscopic cholecystectomy 03/07/2013  . 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  . Bilateral renal cysts 02/28/2012  . Hepatic cyst 02/28/2012  . Atherosclerosis 02/28/2012  . Contact dermatitis 02/28/2012  . Dermatitis 04/03/2011  . BACK PAIN, LUMBAR 10/25/2010  . NEURITIS 10/25/2010  . RHINITIS, CHRONIC 05/11/2010  . PALPITATIONS 05/09/2010  . 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   . VITAMIN D DEFICIENCY 11/02/2009  . BACK PAIN 11/02/2009  . BLADDER NECK OBSTRUCTION 07/07/2009  . ATRIAL FIBRILLATION 07/06/2009  . CAD 01/22/2009  . MUSCLE PAIN 01/22/2009  . DYSPNEA ON EXERTION 08/21/2008  . 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 HYPERGLYCEMIA, BORDERLINE 12/31/2007  . LUMBAR RADICULOPATHY, RIGHT 10/18/2007  . HX, PERSONAL, MALIGNANCY, SKIN NEC 10/18/2007  . COLONIC POLYPS, HX OF 10/18/2007  . ECHOCARDIOGRAM, ABNORMAL 08/19/2007  . HYPERLIPIDEMIA 07/25/2007  . GERD 07/25/2007  . BENIGN PROSTATIC HYPERTROPHY 07/25/2007  . HYPERTENSION 07/23/2007  . ALLERGIC RHINITIS 07/23/2007     11:58 AM,10/29/19 Sherol Dade PT, DPT [facility] [address], [place], [name], [address] Phone: [phone]   Fax:  [phone]  Name: [name] MRN: [id number] Date of Birth: 01-08-1941

## 2019-11-06 ENCOUNTER — Other Ambulatory Visit: Payer: Self-pay

## 2019-11-06 ENCOUNTER — Ambulatory Visit: Payer: Medicare Other | Admitting: Physical Therapy

## 2019-11-06 ENCOUNTER — Encounter: Payer: Self-pay | Admitting: Physical Therapy

## 2019-11-06 DIAGNOSIS — R262 Difficulty in walking, not elsewhere classified: Secondary | ICD-10-CM

## 2019-11-06 DIAGNOSIS — M545 Low back pain, unspecified: Secondary | ICD-10-CM

## 2019-11-06 DIAGNOSIS — M6281 Muscle weakness (generalized): Secondary | ICD-10-CM | POA: Diagnosis not present

## 2019-11-06 DIAGNOSIS — R2689 Other abnormalities of gait and mobility: Secondary | ICD-10-CM | POA: Diagnosis not present

## 2019-11-06 DIAGNOSIS — G8929 Other chronic pain: Secondary | ICD-10-CM | POA: Diagnosis not present

## 2019-11-06 NOTE — Therapy (Signed)
[facility] [address], [place], [name], [address] Phone: [phone]   Fax:  [phone]  Physical Therapy Treatment  Patient Details  Name: [name] MRN: [id number] Date of Birth: Apr 29, 1941 Referring Provider (PT): Shanon Ace MD   Encounter Date: 11/06/2019  PT End of Session - 11/06/19 1308    Visit Number  4    Date for PT Re-Evaluation  12/17/19    PT Start Time  [id number]    PT Stop Time  1310    PT Time Calculation (min)  40 min    Activity Tolerance  Patient tolerated treatment well;No increased pain    Behavior During Therapy  WFL for tasks assessed/performed       Past Medical History:  Diagnosis Date  . Abdominal pain, right lateral 02/21/2012   progressive.    . ALLERGIC RHINITIS 07/23/2007  . Allergy   . Arthritis   . Atrial fibrillation ([place]) 07/06/2009  . BACK PAIN 11/02/2009  . BACK PAIN, LUMBAR 10/25/2010  . BENIGN PROSTATIC HYPERTROPHY 07/25/2007  . 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 CAD 01/22/2009  . Cancer ([place])    skin  . 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  . Cataract   . COLONIC POLYPS, HX OF 10/18/2007  . Cough 10/25/2010  . DIZZINESS 01/22/2009  . DYSPNEA ON EXERTION 08/21/2008  . ECHOCARDIOGRAM, ABNORMAL 08/19/2007  . GERD 07/25/2007  . Heart murmur   . HERNIA 07/06/2009  . HX, PERSONAL, MALIGNANCY, SKIN NEC 10/18/2007  . HYPERGLYCEMIA, BORDERLINE 12/31/2007  . HYPERLIPIDEMIA 07/25/2007  . HYPERTENSION 07/23/2007  . Inguinal hernia   . LUMBAR RADICULOPATHY, RIGHT 10/18/2007  . MUSCLE PAIN 01/22/2009  . Nausea   . NEURITIS 10/25/2010  . Palpitations 05/09/2010  . RHINITIS, CHRONIC 05/11/2010  . RUQ PAIN 01/22/2009  . Sinus polyp   . UNS ADVRS EFF OTH RX MEDICINAL&BIOLOGICAL SBSTNC 04/15/2008  . 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 VITAMIN D DEFICIENCY 11/02/2009  . Voice strain     Past Surgical History:  Procedure Laterality Date  . BACK SURGERY    . basal cell and melanoma  removed     face  . BONY PELVIS SURGERY    . CATARACT EXTRACTION     BIL  . COLONOSCOPY    . CORONARY ARTERY BYPASS GRAFT  2009  . HERNIA REPAIR  7 times   inguinal  . LAPAROSCOPIC CHOLECYSTECTOMY  03/03/2013  . LUMBAR SPINE SURGERY    . POLYPECTOMY    . TONSILLECTOMY      There were no vitals filed for this visit.  Subjective Assessment - 11/06/19 1235    Subjective  Pt states that his neck is feeling better. He is laying on the bed with pillows without any difficulty. He struggled with his seated hamstring stretch.    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 (L5 distribution), multiple co-morbidities: HTN, CAD, A-fib, lumbar radiculopathy, dyspnea, CABG 2009    Currently in Pain?  No/denies    Pain Onset  More than a month ago                       [facility] Adult PT Treatment/Exercise - 11/06/19 0001      Knee/Hip Exercises: Aerobic   Nustep  L2 increased 1 level every 2 minutes x8 min, PT present to discuss session      Knee/Hip Exercises: Standing   Knee Flexion  Strengthening;Right;Left;10 reps    Knee Flexion Limitations  3#ankle weight    Hip Flexion  Stengthening;Right;Left;10 reps;Knee bent    Hip Flexion Limitations  #3, visual cue to improve posture     Forward Step Up  Both;1 set;10 reps    Forward Step Up Limitations  BUE support, contralateral LE tap       Knee/Hip Exercises: Seated   Other Seated Knee/Hip Exercises  heel raises with #10 dumbbells on knees x20 reps           Balance Exercises - 11/06/19 1259      Balance Exercises: Standing   Standing Eyes Opened  Narrow base of support (BOS);Foam/compliant surface   trunk rotation holding weighted blue ball    Tandem Stance  1 rep;20 secs    Standing, One Foot on a Step  Eyes open;6 inch;5 reps   5 sec LE lift off step, 1 finger support    Other Standing Exercises  step over and back (pool noodle) x10 reps each side          PT Education - 11/06/19 1307    Education Details  technique with therex    Person(s) Educated  Patient    Methods  Explanation;Verbal cues    Comprehension  Verbalized understanding;Returned demonstration       PT Short Term Goals - 10/27/19 1359      PT SHORT TERM GOAL #1   Title  Pt will be independent in his HEP.    Status  Achieved        PT Long Term Goals - 10/23/19 1247      PT LONG TERM GOAL #1   Title  Pt will improve his BERG balance score to >/= 40/56.    Baseline  BERG: 10/29/20202 :    Time  8    Period  Weeks    Status  New    Target Date  12/18/19      PT LONG TERM GOAL #2   Title  Pt will improve his 5 time sit to stand to </= 15 seconds.    Baseline  28 seconds using UE support    Time  8    Period  Weeks    Status  New      PT LONG TERM GOAL #3   Title  Pt will be able to amb >/= 15 minutes without rest break for community activites.    Baseline  5 minutes before having to sit and rest from pain and fatigue    Time  8    Period  Weeks    Status  New    Target Date  12/18/19            Plan - 11/06/19 1343    Clinical Impression Statement  Pt is doing well with his HEP after adjustments made last session. He was able to complete standing LE strengthening with intermittent rest breaks. He struggled with single leg balance, requiring 1 finger support for increased stability. PT provided regular cuing to adjust posture with exercises, secondary to pt's tendency to flex forward after standing for longer periods of time. Ended session without complaints of pain. Will continue with current POC.    PT Treatment/Interventions  ADLs/Self Care Home Management;Cryotherapy;Electrical Stimulation;Moist Heat;Iontophoresis 4mg /ml Dexamethasone;Ultrasound;Gait training;Stair training;Balance training;Therapeutic exercise;Therapeutic activities;Functional mobility training;Neuromuscular re-education;Patient/family education;Manual techniques;Passive range  of motion;Dry needling;Taping    PT Next Visit Plan  Nustep, LE srengthening, gait training, balance exercises    PT Home  Exercise Plan  Access Code: [email or web address]    Consulted and Agree with Plan of Care  Patient       Patient will benefit from skilled therapeutic intervention in order to improve the following deficits and impairments:  Abnormal gait, Pain, Postural dysfunction, Decreased strength, Impaired flexibility, Decreased balance, Difficulty walking, Cardiopulmonary status limiting activity, Decreased activity tolerance, Decreased range of motion  Visit Diagnosis: Difficulty in walking, not elsewhere classified  Muscle weakness (generalized)  Other abnormalities of gait and mobility  Chronic bilateral low back pain without sciatica     Problem List Patient Active Problem List   Diagnosis Date Noted  . Allergic conjunctivitis of both eyes 04/22/2018  . Neck pain on right side 11/03/2014  . Urinary frequency 07/27/2014  . Medicare annual wellness visit, initial 07/27/2014  . Rash and nonspecific skin eruption 07/27/2014  . Nodule, subcutaneous 07/27/2014  . Atherosclerosis of aorta ([place]) 10/25/2013  . Hyperglycemia 03/07/2013  . Status post laparoscopic cholecystectomy 03/07/2013  . 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  . Bilateral renal cysts 02/28/2012  . Hepatic cyst 02/28/2012  . Atherosclerosis 02/28/2012  . Contact dermatitis 02/28/2012  . Dermatitis 04/03/2011  . BACK PAIN, LUMBAR 10/25/2010  . NEURITIS 10/25/2010  . RHINITIS, CHRONIC 05/11/2010  . PALPITATIONS 05/09/2010  . 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  . VITAMIN D DEFICIENCY 11/02/2009  . BACK PAIN 11/02/2009  . BLADDER NECK OBSTRUCTION 07/07/2009  . ATRIAL FIBRILLATION 07/06/2009  . CAD 01/22/2009  . MUSCLE PAIN 01/22/2009  . DYSPNEA ON EXERTION 08/21/2008  . 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 HYPERGLYCEMIA, BORDERLINE 12/31/2007  . LUMBAR  RADICULOPATHY, RIGHT 10/18/2007  . HX, PERSONAL, MALIGNANCY, SKIN NEC 10/18/2007  . COLONIC POLYPS, HX OF 10/18/2007  . ECHOCARDIOGRAM, ABNORMAL 08/19/2007  . HYPERLIPIDEMIA 07/25/2007  . GERD 07/25/2007  . BENIGN PROSTATIC HYPERTROPHY 07/25/2007  . HYPERTENSION 07/23/2007  . ALLERGIC RHINITIS 07/23/2007    1:50 PM,11/06/19 Sherol Dade PT, DPT [place] [facility] [address], [place], [name], [address] Phone: [phone]   Fax:  [phone]  Name: [name] MRN: [id number] Date of Birth: 08-28-1941

## 2019-11-10 ENCOUNTER — Other Ambulatory Visit: Payer: Self-pay

## 2019-11-10 ENCOUNTER — Encounter: Payer: Self-pay | Admitting: Physical Therapy

## 2019-11-10 ENCOUNTER — Ambulatory Visit: Payer: Medicare Other | Admitting: Physical Therapy

## 2019-11-10 DIAGNOSIS — R262 Difficulty in walking, not elsewhere classified: Secondary | ICD-10-CM

## 2019-11-10 DIAGNOSIS — M6281 Muscle weakness (generalized): Secondary | ICD-10-CM

## 2019-11-10 DIAGNOSIS — R2689 Other abnormalities of gait and mobility: Secondary | ICD-10-CM

## 2019-11-10 DIAGNOSIS — M545 Low back pain, unspecified: Secondary | ICD-10-CM

## 2019-11-10 DIAGNOSIS — G8929 Other chronic pain: Secondary | ICD-10-CM | POA: Diagnosis not present

## 2019-11-10 NOTE — Patient Instructions (Signed)
Access Code: [email or web address]  URL: [email or web address]  Date: 11/10/2019  Prepared by: Jari Favre   Exercises  Seated Correct Posture - 7x weekly  Hooklying Single Knee to Chest Stretch - 5 reps - 30 seconds hold - 2x daily - 7x weekly  Seated Table Hamstring Stretch - 2 reps - 30 hold - 1x daily - 7x weekly  Sit to Stand with Counter Support - 10 reps - 3x daily - 7x weekly  Standing Hip Flexor Stretch - 2 reps - 1 sets - 30 sec hold - 3x daily - 7x weekly  Half Tandem Stance Balance with Head Rotation - 10 reps - 3 sets - 1x daily - 7x weekly  Side Stepping with Counter Support - 10 reps - 3 sets - 1x daily - 7x weekly

## 2019-11-10 NOTE — Therapy (Signed)
[facility] [address], [place], [name], [address] Phone: [phone]   Fax:  [phone]  Physical Therapy Treatment  Patient Details  Name: [name] MRN: [id number] Date of Birth: [birth year] Referring Provider (PT): Shanon Ace MD   Encounter Date: 11/10/2019  PT End of Session - 11/10/19 1106    Visit Number  5    Number of Visits  16    Date for PT Re-Evaluation  12/17/19    PT Start Time  1100    PT Stop Time  1145    PT Time Calculation (min)  45 min    Activity Tolerance  Patient tolerated treatment well;No increased pain    Behavior During Therapy  WFL for tasks assessed/performed       Past Medical History:  Diagnosis Date  . Abdominal pain, right lateral 02/21/2012   progressive.    . ALLERGIC RHINITIS 07/23/2007  . Allergy   . Arthritis   . Atrial fibrillation ([place]) 07/06/2009  . BACK PAIN 11/02/2009  . BACK PAIN, LUMBAR 10/25/2010  . BENIGN PROSTATIC HYPERTROPHY 07/25/2007  . 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 CAD 01/22/2009  . Cancer ([place])    skin  . 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  . Cataract   . COLONIC POLYPS, HX OF 10/18/2007  . Cough 10/25/2010  . DIZZINESS 01/22/2009  . DYSPNEA ON EXERTION 08/21/2008  . ECHOCARDIOGRAM, ABNORMAL 08/19/2007  . GERD 07/25/2007  . Heart murmur   . HERNIA 07/06/2009  . HX, PERSONAL, MALIGNANCY, SKIN NEC 10/18/2007  . HYPERGLYCEMIA, BORDERLINE 12/31/2007  . HYPERLIPIDEMIA 07/25/2007  . HYPERTENSION 07/23/2007  . Inguinal hernia   . LUMBAR RADICULOPATHY, RIGHT 10/18/2007  . MUSCLE PAIN 01/22/2009  . Nausea   . NEURITIS 10/25/2010  . Palpitations 05/09/2010  . RHINITIS, CHRONIC 05/11/2010  . RUQ PAIN 01/22/2009  . Sinus polyp   . UNS ADVRS EFF OTH RX MEDICINAL&BIOLOGICAL SBSTNC 04/15/2008  . 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 VITAMIN D DEFICIENCY 11/02/2009  . Voice strain     Past Surgical History:  Procedure Laterality Date  . BACK SURGERY    .  basal cell and melanoma removed     face  . BONY PELVIS SURGERY    . CATARACT EXTRACTION     BIL  . COLONOSCOPY    . CORONARY ARTERY BYPASS GRAFT  2009  . HERNIA REPAIR  7 times   inguinal  . LAPAROSCOPIC CHOLECYSTECTOMY  03/03/2013  . LUMBAR SPINE SURGERY    . POLYPECTOMY    . TONSILLECTOMY      There were no vitals filed for this visit.  Subjective Assessment - 11/10/19 1207    Subjective  Pt states he felt good after previous treatment.  No new complaints.  At the end of the session he states that sometimes turning is hard and he has to think about it in order to lift his leg.    Patient Stated Goals  Be able to walk without staggering    Currently in Pain?  No/denies         [facility] PT Assessment - 11/10/19 0001      Berg Balance Test   Merrilee Jansky comment:  able to do feet together holding 1 min; small step forward x 30 sec each side                   [facility] Adult PT Treatment/Exercise - 11/10/19 0001      Transfers   Five time  sit to stand comments   20 sec, 14 sec      Ambulation/Gait   Gait Comments  cues to tighten muscles between shoulder blades - able to walk over 2 minutes before needing seated rest break      Neuro Re-ed    Neuro Re-ed Details   cues for standing posture; feet together and modified      Knee/Hip Exercises: Stretches   Hip Flexor Stretch  Right;Left;3 reps;20 seconds   to improve upright posture and stride length     Knee/Hip Exercises: Aerobic   Nustep  L2 increased to level every 4 after 5 minutes x8 min, PT present to discuss session      Knee/Hip Exercises: Standing   Knee Flexion  Strengthening;Right;Left;10 reps   two finger support   Knee Flexion Limitations  #2 ankle weight, pt instructed to maintain focus on object ahead of him   3# not available   Hip Flexion  Stengthening;Right;Left;10 reps;Knee bent   tapping foot on 6" step - no UE   Hip Flexion Limitations  #2 ankle weight, pt instructed to maintain focus on object  ahead of him   3# not available   Hip Abduction  Stengthening;Right;Left;10 reps    Forward Step Up  Both;1 set;10 reps    Forward Step Up Limitations  BUE support, contralateral LE tap   2# ankle weight             PT Education - 11/10/19 1202    Education Details  Access Code: [email or web address]    Person(s) Educated  Patient    Methods  Explanation;Demonstration;Handout;Verbal cues    Comprehension  Verbalized understanding;Returned demonstration       PT Short Term Goals - 11/10/19 1112      PT SHORT TERM GOAL #2   Title  Pt will be able to perform sit to stand without UE support.    Status  Achieved        PT Long Term Goals - 11/10/19 1112      PT LONG TERM GOAL #2   Title  Pt will improve his 5 time sit to stand to </= 15 seconds.    Baseline  20 sec no UE first attemt; 14 sec no UE second attempt    Status  Partially Met            Plan - 11/10/19 1203    Clinical Impression Statement  Pt demontrates improved endurance with standing exercises.  He continues to need seated rest breaks every few exercises.  He was able to get through more exercises today.  He did well with standing balance exercises with head turns and can do safely with UE support as needed.  Pt was able to progress HEP as seen in chart.  He will benefit from skilled PT to reduce risk of falls and improve posture and overall endurance.    Examination-Activity Limitations  Bathing;Stand;Stairs;Squat;Lift;Transfers;Sit    PT Treatment/Interventions  ADLs/Self Care Home Management;Cryotherapy;Electrical Stimulation;Moist Heat;Iontophoresis 77m/ml Dexamethasone;Ultrasound;Gait training;Stair training;Balance training;Therapeutic exercise;Therapeutic activities;Functional mobility training;Neuromuscular re-education;Patient/family education;Manual techniques;Passive range of motion;Dry needling;Taping    PT Next Visit Plan  Nustep, LE srengthening, gait training, balance exercises    PT Home Exercise  Plan  Access Code: [email or web address]   Consulted and Agree with Plan of Care  Patient       Patient will benefit from skilled therapeutic intervention in order to improve the following deficits and impairments:  Abnormal gait, Pain, Postural dysfunction, Decreased strength,  Impaired flexibility, Decreased balance, Difficulty walking, Cardiopulmonary status limiting activity, Decreased activity tolerance, Decreased range of motion  Visit Diagnosis: Difficulty in walking, not elsewhere classified  Muscle weakness (generalized)  Other abnormalities of gait and mobility  Chronic bilateral low back pain without sciatica     Problem List Patient Active Problem List   Diagnosis Date Noted  . Allergic conjunctivitis of both eyes 04/22/2018  . Neck pain on right side 11/03/2014  . Urinary frequency 07/27/2014  . Medicare annual wellness visit, initial 07/27/2014  . Rash and nonspecific skin eruption 07/27/2014  . Nodule, subcutaneous 07/27/2014  . Atherosclerosis of aorta ([place]) 10/25/2013  . Hyperglycemia 03/07/2013  . Status post laparoscopic cholecystectomy 03/07/2013  . 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  . Bilateral renal cysts 02/28/2012  . Hepatic cyst 02/28/2012  . Atherosclerosis 02/28/2012  . Contact dermatitis 02/28/2012  . Dermatitis 04/03/2011  . BACK PAIN, LUMBAR 10/25/2010  . NEURITIS 10/25/2010  . RHINITIS, CHRONIC 05/11/2010  . PALPITATIONS 05/09/2010  . 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  . VITAMIN D DEFICIENCY 11/02/2009  . BACK PAIN 11/02/2009  . BLADDER NECK OBSTRUCTION 07/07/2009  . ATRIAL FIBRILLATION 07/06/2009  . CAD 01/22/2009  . MUSCLE PAIN 01/22/2009  . DYSPNEA ON EXERTION 08/21/2008  . 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 HYPERGLYCEMIA, BORDERLINE 12/31/2007  . LUMBAR RADICULOPATHY, RIGHT 10/18/2007  . HX, PERSONAL, MALIGNANCY, SKIN NEC 10/18/2007  . COLONIC POLYPS, HX OF 10/18/2007  .  ECHOCARDIOGRAM, ABNORMAL 08/19/2007  . HYPERLIPIDEMIA 07/25/2007  . GERD 07/25/2007  . BENIGN PROSTATIC HYPERTROPHY 07/25/2007  . HYPERTENSION 07/23/2007  . ALLERGIC RHINITIS 07/23/2007    Jule Ser, PT 11/10/2019, 12:09 PM  [facility] [address], [place], [name], [address] Phone: [phone]   Fax:  [phone]  Name: ISSIAC JAMAR MRN: [id number] Date of Birth: 1941-08-24

## 2019-11-12 ENCOUNTER — Other Ambulatory Visit: Payer: Self-pay

## 2019-11-12 ENCOUNTER — Ambulatory Visit: Payer: Medicare Other | Admitting: Physical Therapy

## 2019-11-12 ENCOUNTER — Encounter: Payer: Self-pay | Admitting: Physical Therapy

## 2019-11-12 DIAGNOSIS — M6281 Muscle weakness (generalized): Secondary | ICD-10-CM

## 2019-11-12 DIAGNOSIS — M545 Low back pain, unspecified: Secondary | ICD-10-CM

## 2019-11-12 DIAGNOSIS — R2689 Other abnormalities of gait and mobility: Secondary | ICD-10-CM

## 2019-11-12 DIAGNOSIS — R262 Difficulty in walking, not elsewhere classified: Secondary | ICD-10-CM

## 2019-11-12 DIAGNOSIS — G8929 Other chronic pain: Secondary | ICD-10-CM | POA: Diagnosis not present

## 2019-11-12 NOTE — Therapy (Signed)
[facility] [address] [place], [name], [address] Phone: [phone]   Fax:  [phone]  Physical Therapy Treatment  Patient Details  Name: [name] MRN: [id number] Date of Birth: 10-31-1941 Referring Provider (PT): Shanon Ace MD   Encounter Date: 11/12/2019  PT End of Session - 11/12/19 1322    Visit Number  6    Number of Visits  16    Date for PT Re-Evaluation  12/17/19    PT Start Time  7342    PT Stop Time  1055    PT Time Calculation (min)  40 min    Activity Tolerance  Patient tolerated treatment well;No increased pain    Behavior During Therapy  WFL for tasks assessed/performed       Past Medical History:  Diagnosis Date  . Abdominal pain, right lateral 02/21/2012   progressive.    . ALLERGIC RHINITIS 07/23/2007  . Allergy   . Arthritis   . Atrial fibrillation ([place]) 07/06/2009  . BACK PAIN 11/02/2009  . BACK PAIN, LUMBAR 10/25/2010  . BENIGN PROSTATIC HYPERTROPHY 07/25/2007  . 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 CAD 01/22/2009  . Cancer ([place])    skin  . 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  . Cataract   . COLONIC POLYPS, HX OF 10/18/2007  . Cough 10/25/2010  . DIZZINESS 01/22/2009  . DYSPNEA ON EXERTION 08/21/2008  . ECHOCARDIOGRAM, ABNORMAL 08/19/2007  . GERD 07/25/2007  . Heart murmur   . HERNIA 07/06/2009  . HX, PERSONAL, MALIGNANCY, SKIN NEC 10/18/2007  . HYPERGLYCEMIA, BORDERLINE 12/31/2007  . HYPERLIPIDEMIA 07/25/2007  . HYPERTENSION 07/23/2007  . Inguinal hernia   . LUMBAR RADICULOPATHY, RIGHT 10/18/2007  . MUSCLE PAIN 01/22/2009  . Nausea   . NEURITIS 10/25/2010  . Palpitations 05/09/2010  . RHINITIS, CHRONIC 05/11/2010  . RUQ PAIN 01/22/2009  . Sinus polyp   . UNS ADVRS EFF OTH RX MEDICINAL&BIOLOGICAL SBSTNC 04/15/2008  . 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 VITAMIN D DEFICIENCY 11/02/2009  . Voice strain     Past Surgical History:  Procedure Laterality Date  . BACK SURGERY    .  basal cell and melanoma removed     face  . BONY PELVIS SURGERY    . CATARACT EXTRACTION     BIL  . COLONOSCOPY    . CORONARY ARTERY BYPASS GRAFT  2009  . HERNIA REPAIR  7 times   inguinal  . LAPAROSCOPIC CHOLECYSTECTOMY  03/03/2013  . LUMBAR SPINE SURGERY    . POLYPECTOMY    . TONSILLECTOMY      There were no vitals filed for this visit.  Subjective Assessment - 11/12/19 1027    Subjective  Pt states that things are going well.    Patient Stated Goals  Be able to walk without staggering    Currently in Pain?  No/denies                       [facility] Adult PT Treatment/Exercise - 11/12/19 0001      Knee/Hip Exercises: Aerobic   Nustep  L1/L4 inverval every 2 min, x8 min      Knee/Hip Exercises: Standing   Heel Raises  Both;20 reps    Heel Raises Limitations  3# ankle weights, 2 finger support    Hip Flexion  Stengthening;Right;Left;10 reps;Knee bent    Hip Flexion Limitations  --   #3, 1 finger support   Other Standing Knee Exercises  rows with green TB  2x10 reps       Knee/Hip Exercises: Seated   Sit to Sand  3 sets;5 reps   seated and standing on foam         Balance Exercises - 11/12/19 1046      Balance Exercises: Standing   Tandem Stance  Eyes open;Eyes closed;2 reps;20 secs    Other Standing Exercises  step over and back (pool noodle) x10 reps each side; floor ladder walking forward x2, sidestepping x2, sidestep with change direction to forward x2 trials CGA        PT Education - 11/12/19 1322    Education Details  technique with therex    Person(s) Educated  Patient    Methods  Explanation    Comprehension  Verbalized understanding       PT Short Term Goals - 11/10/19 1112      PT SHORT TERM GOAL #2   Title  Pt will be able to perform sit to stand without UE support.    Status  Achieved        PT Long Term Goals - 11/10/19 1112      PT LONG TERM GOAL #2   Title  Pt will improve his 5 time sit to stand to </= 15 seconds.     Baseline  20 sec no UE first attemt; 14 sec no UE second attempt    Status  Partially Met            Plan - 11/12/19 1322    Clinical Impression Statement  Pt continues to do well with his HEP and exercise progressions, noting no increase in low back pain throughout the day despite this. Today's session focused more on standing static and dynamic balance activity. Pt requires CGA with a majority of the activities and had more unsteadiness with the floor ladder. Pt requires intermittent verbal cuing to improve posture due to his tendencies to look at his feet. Will continue with current POC to promote stability with standing and walking activity.    Examination-Activity Limitations  Bathing;Stand;Stairs;Squat;Lift;Transfers;Sit    PT Treatment/Interventions  ADLs/Self Care Home Management;Cryotherapy;Electrical Stimulation;Moist Heat;Iontophoresis 62m/ml Dexamethasone;Ultrasound;Gait training;Stair training;Balance training;Therapeutic exercise;Therapeutic activities;Functional mobility training;Neuromuscular re-education;Patient/family education;Manual techniques;Passive range of motion;Dry needling;Taping    PT Next Visit Plan  Nustep, LE srengthening, gait training, balance exercises    PT Home Exercise Plan  Access Code: [email or web address]   Consulted and Agree with Plan of Care  Patient       Patient will benefit from skilled therapeutic intervention in order to improve the following deficits and impairments:  Abnormal gait, Pain, Postural dysfunction, Decreased strength, Impaired flexibility, Decreased balance, Difficulty walking, Cardiopulmonary status limiting activity, Decreased activity tolerance, Decreased range of motion  Visit Diagnosis: Difficulty in walking, not elsewhere classified  Muscle weakness (generalized)  Other abnormalities of gait and mobility  Chronic bilateral low back pain without sciatica     Problem List Patient Active Problem List   Diagnosis Date Noted  .  Allergic conjunctivitis of both eyes 04/22/2018  . Neck pain on right side 11/03/2014  . Urinary frequency 07/27/2014  . Medicare annual wellness visit, initial 07/27/2014  . Rash and nonspecific skin eruption 07/27/2014  . Nodule, subcutaneous 07/27/2014  . Atherosclerosis of aorta ([place] 10/25/2013  . Hyperglycemia 03/07/2013  . Status post laparoscopic cholecystectomy 03/07/2013  . 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  . Bilateral renal cysts 02/28/2012  . Hepatic cyst 02/28/2012  . Atherosclerosis  02/28/2012  . Contact dermatitis 02/28/2012  . Dermatitis 04/03/2011  . BACK PAIN, LUMBAR 10/25/2010  . NEURITIS 10/25/2010  . RHINITIS, CHRONIC 05/11/2010  . PALPITATIONS 05/09/2010  . 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  . VITAMIN D DEFICIENCY 11/02/2009  . BACK PAIN 11/02/2009  . BLADDER NECK OBSTRUCTION 07/07/2009  . ATRIAL FIBRILLATION 07/06/2009  . CAD 01/22/2009  . MUSCLE PAIN 01/22/2009  . DYSPNEA ON EXERTION 08/21/2008  . 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 HYPERGLYCEMIA, BORDERLINE 12/31/2007  . LUMBAR RADICULOPATHY, RIGHT 10/18/2007  . HX, PERSONAL, MALIGNANCY, SKIN NEC 10/18/2007  . COLONIC POLYPS, HX OF 10/18/2007  . ECHOCARDIOGRAM, ABNORMAL 08/19/2007  . HYPERLIPIDEMIA 07/25/2007  . GERD 07/25/2007  . BENIGN PROSTATIC HYPERTROPHY 07/25/2007  . HYPERTENSION 07/23/2007  . ALLERGIC RHINITIS 07/23/2007   1:23 PM,11/12/19 Sherol Dade PT, DPT [facility] [address], [place], [name], [address] Phone: [phone]   Fax:  [phone]  Name: VASILI FOK MRN: [id number] Date of Birth: 02/21/1941

## 2019-11-18 ENCOUNTER — Ambulatory Visit: Payer: Medicare Other | Admitting: Physical Therapy

## 2019-11-18 ENCOUNTER — Other Ambulatory Visit: Payer: Self-pay

## 2019-11-18 DIAGNOSIS — M6281 Muscle weakness (generalized): Secondary | ICD-10-CM

## 2019-11-18 DIAGNOSIS — G8929 Other chronic pain: Secondary | ICD-10-CM | POA: Diagnosis not present

## 2019-11-18 DIAGNOSIS — R262 Difficulty in walking, not elsewhere classified: Secondary | ICD-10-CM | POA: Diagnosis not present

## 2019-11-18 DIAGNOSIS — R2689 Other abnormalities of gait and mobility: Secondary | ICD-10-CM

## 2019-11-18 DIAGNOSIS — M545 Low back pain, unspecified: Secondary | ICD-10-CM

## 2019-11-18 NOTE — Therapy (Signed)
[facility] [address], [place], [name], [address] Phone: [phone]   Fax:  [phone]  Physical Therapy Treatment  Patient Details  Name: [name] MRN: [id number] Date of Birth: [birth year] Referring Provider (PT): Shanon Ace MD   Encounter Date: 11/18/2019  PT End of Session - 11/18/19 1057    Visit Number  7    Date for PT Re-Evaluation  12/17/19    PT Start Time  5366    PT Stop Time  1059    PT Time Calculation (min)  38 min    Activity Tolerance  Patient tolerated treatment well;No increased pain    Behavior During Therapy  WFL for tasks assessed/performed       Past Medical History:  Diagnosis Date  . Abdominal pain, right lateral 02/21/2012   progressive.    . ALLERGIC RHINITIS 07/23/2007  . Allergy   . Arthritis   . Atrial fibrillation ([place]) 07/06/2009  . BACK PAIN 11/02/2009  . BACK PAIN, LUMBAR 10/25/2010  . BENIGN PROSTATIC HYPERTROPHY 07/25/2007  . 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 CAD 01/22/2009  . Cancer ([place])    skin  . 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  . Cataract   . COLONIC POLYPS, HX OF 10/18/2007  . Cough 10/25/2010  . DIZZINESS 01/22/2009  . DYSPNEA ON EXERTION 08/21/2008  . ECHOCARDIOGRAM, ABNORMAL 08/19/2007  . GERD 07/25/2007  . Heart murmur   . HERNIA 07/06/2009  . HX, PERSONAL, MALIGNANCY, SKIN NEC 10/18/2007  . HYPERGLYCEMIA, BORDERLINE 12/31/2007  . HYPERLIPIDEMIA 07/25/2007  . HYPERTENSION 07/23/2007  . Inguinal hernia   . LUMBAR RADICULOPATHY, RIGHT 10/18/2007  . MUSCLE PAIN 01/22/2009  . Nausea   . NEURITIS 10/25/2010  . Palpitations 05/09/2010  . RHINITIS, CHRONIC 05/11/2010  . RUQ PAIN 01/22/2009  . Sinus polyp   . UNS ADVRS EFF OTH RX MEDICINAL&BIOLOGICAL SBSTNC 04/15/2008  . 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 VITAMIN D DEFICIENCY 11/02/2009  . Voice strain     Past Surgical History:  Procedure Laterality Date  . BACK SURGERY    . basal cell and melanoma  removed     face  . BONY PELVIS SURGERY    . CATARACT EXTRACTION     BIL  . COLONOSCOPY    . CORONARY ARTERY BYPASS GRAFT  2009  . HERNIA REPAIR  7 times   inguinal  . LAPAROSCOPIC CHOLECYSTECTOMY  03/03/2013  . LUMBAR SPINE SURGERY    . POLYPECTOMY    . TONSILLECTOMY      There were no vitals filed for this visit.  Subjective Assessment - 11/18/19 1102    Subjective  Pt states he was sore after last time but it was okay    Patient Stated Goals  Be able to walk without staggering    Currently in Pain?  No/denies                       [facility] Adult PT Treatment/Exercise - 11/18/19 0001      Ambulation/Gait   Gait Comments  cues to stand tall - walking 3 min 20 sec - 5 laps around gym      Knee/Hip Exercises: Aerobic   Nustep  L1/L4 inverval every 2 min, x10 min      Knee/Hip Exercises: Standing   Heel Raises  Both;20 reps    Heel Raises Limitations  3# ankle weight    Hip Flexion  Stengthening;Right;Left;10 reps;Knee bent    Hip Flexion  Limitations  --   #3, 1 finger support   Hip Abduction  Stengthening;Right;Left;10 reps    Abduction Limitations  3#    Forward Step Up  Both;1 set;10 reps    Other Standing Knee Exercises  rows and extension with green TB 2x10 reps       Knee/Hip Exercises: Seated   Sit to Sand  2 sets;10 reps;with UE support   standing on foam - min UE for 1st rep              PT Short Term Goals - 11/10/19 1112      PT SHORT TERM GOAL #2   Title  Pt will be able to perform sit to stand without UE support.    Status  Achieved        PT Long Term Goals - 11/10/19 1112      PT LONG TERM GOAL #2   Title  Pt will improve his 5 time sit to stand to </= 15 seconds.    Baseline  20 sec no UE first attemt; 14 sec no UE second attempt    Status  Partially Met            Plan - 11/18/19 1054    Clinical Impression Statement  Pt did well with exercise progrssion today.  He was able to stand on foam mat from regular  chair height only needing UE support on first rep.  He was able to increase time of walking to over three minutes without needing a break. Pt continues to need some cues for posture but was self correcting on his own throughout treatment today.    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), multiple co-morbidities: HTN, CAD, A-fib, lumbar radiculopathy, dyspnea, CABG 2009    PT Treatment/Interventions  ADLs/Self Care Home Management;Cryotherapy;Electrical Stimulation;Moist Heat;Iontophoresis 56m/ml Dexamethasone;Ultrasound;Gait training;Stair training;Balance training;Therapeutic exercise;Therapeutic activities;Functional mobility training;Neuromuscular re-education;Patient/family education;Manual techniques;Passive range of motion;Dry needling;Taping    PT Next Visit Plan  Nustep, LE srengthening, gait training, balance exercises    PT Home Exercise Plan  Access Code: [email or web address]   Consulted and Agree with Plan of Care  Patient       Patient will benefit from skilled therapeutic intervention in order to improve the following deficits and impairments:  Abnormal gait, Pain, Postural dysfunction, Decreased strength, Impaired flexibility, Decreased balance, Difficulty walking, Cardiopulmonary status limiting activity, Decreased activity tolerance, Decreased range of motion  Visit Diagnosis: Difficulty in walking, not elsewhere classified  Muscle weakness (generalized)  Other abnormalities of gait and mobility  Chronic bilateral low back pain without sciatica     Problem List Patient Active Problem List   Diagnosis Date Noted  . Allergic conjunctivitis of both eyes 04/22/2018  . Neck pain on right side 11/03/2014  . Urinary frequency 07/27/2014  . Medicare annual wellness visit, initial 07/27/2014  . Rash and nonspecific skin eruption 07/27/2014  . Nodule, subcutaneous 07/27/2014  . Atherosclerosis of aorta ([place]  10/25/2013  . Hyperglycemia 03/07/2013  . Status post laparoscopic cholecystectomy 03/07/2013  . 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  . Bilateral renal cysts 02/28/2012  . Hepatic cyst 02/28/2012  . Atherosclerosis 02/28/2012  . Contact dermatitis 02/28/2012  . Dermatitis 04/03/2011  . BACK PAIN, LUMBAR 10/25/2010  . NEURITIS 10/25/2010  . RHINITIS, CHRONIC 05/11/2010  . PALPITATIONS 05/09/2010  . 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  . 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 BACK PAIN 11/02/2009  . BLADDER NECK OBSTRUCTION 07/07/2009  . ATRIAL FIBRILLATION 07/06/2009  . CAD 01/22/2009  . MUSCLE PAIN 01/22/2009  . DYSPNEA ON EXERTION 08/21/2008  . 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 HYPERGLYCEMIA, BORDERLINE 12/31/2007  . LUMBAR RADICULOPATHY, RIGHT 10/18/2007  . HX, PERSONAL, MALIGNANCY, SKIN NEC 10/18/2007  . COLONIC POLYPS, HX OF 10/18/2007  . ECHOCARDIOGRAM, ABNORMAL 08/19/2007  . HYPERLIPIDEMIA 07/25/2007  . GERD 07/25/2007  . BENIGN PROSTATIC HYPERTROPHY 07/25/2007  . HYPERTENSION 07/23/2007  . ALLERGIC RHINITIS 07/23/2007    Jule Ser, PT 11/18/2019, 11:02 AM  [facility] [address] [place], [name], [address] Phone: [phone]   Fax:  [phone]  Name: [name] MRN: [id number] Date of Birth: 03-Oct-1941

## 2019-11-24 ENCOUNTER — Ambulatory Visit: Payer: Medicare Other | Admitting: Physical Therapy

## 2019-11-24 ENCOUNTER — Other Ambulatory Visit: Payer: Self-pay

## 2019-11-24 DIAGNOSIS — R262 Difficulty in walking, not elsewhere classified: Secondary | ICD-10-CM

## 2019-11-24 DIAGNOSIS — G8929 Other chronic pain: Secondary | ICD-10-CM

## 2019-11-24 DIAGNOSIS — M545 Low back pain, unspecified: Secondary | ICD-10-CM

## 2019-11-24 DIAGNOSIS — R2689 Other abnormalities of gait and mobility: Secondary | ICD-10-CM

## 2019-11-24 DIAGNOSIS — M6281 Muscle weakness (generalized): Secondary | ICD-10-CM

## 2019-11-24 NOTE — Therapy (Addendum)
[facility] [address], [place], [name], [address] Phone: [phone]   Fax:  [phone]  Physical Therapy Treatment  Patient Details  Name: [name] MRN: [id number] Date of Birth: 1941-07-17 Referring Provider (PT): Jon Ace MD   Encounter Date: 11/24/2019  PT End of Session - 11/24/19 1021    Visit Number  --   arrived no visit d/t feeling sick      Past Medical History:  Diagnosis Date  . Abdominal pain, right lateral 02/21/2012   progressive.    . ALLERGIC RHINITIS 07/23/2007  . Allergy   . Arthritis   . Atrial fibrillation ([place]) 07/06/2009  . BACK PAIN 11/02/2009  . BACK PAIN, LUMBAR 10/25/2010  . BENIGN PROSTATIC HYPERTROPHY 07/25/2007  . 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 CAD 01/22/2009  . Cancer ([place])    skin  . 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  . Cataract   . COLONIC POLYPS, HX OF 10/18/2007  . Cough 10/25/2010  . DIZZINESS 01/22/2009  . DYSPNEA ON EXERTION 08/21/2008  . ECHOCARDIOGRAM, ABNORMAL 08/19/2007  . GERD 07/25/2007  . Heart murmur   . HERNIA 07/06/2009  . HX, PERSONAL, MALIGNANCY, SKIN NEC 10/18/2007  . HYPERGLYCEMIA, BORDERLINE 12/31/2007  . HYPERLIPIDEMIA 07/25/2007  . HYPERTENSION 07/23/2007  . Inguinal hernia   . LUMBAR RADICULOPATHY, RIGHT 10/18/2007  . MUSCLE PAIN 01/22/2009  . Nausea   . NEURITIS 10/25/2010  . Palpitations 05/09/2010  . RHINITIS, CHRONIC 05/11/2010  . RUQ PAIN 01/22/2009  . Sinus polyp   . UNS ADVRS EFF OTH RX MEDICINAL&BIOLOGICAL SBSTNC 04/15/2008  . 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 VITAMIN D DEFICIENCY 11/02/2009  . Voice strain     Past Surgical History:  Procedure Laterality Date  . BACK SURGERY    . basal cell and melanoma removed     face  . BONY PELVIS SURGERY    . CATARACT EXTRACTION     BIL  . COLONOSCOPY    . CORONARY ARTERY BYPASS GRAFT  2009  . HERNIA REPAIR  7 times   inguinal  . LAPAROSCOPIC CHOLECYSTECTOMY  03/03/2013  . LUMBAR  SPINE SURGERY    . POLYPECTOMY    . TONSILLECTOMY      There were no vitals filed for this visit.  Subjective Assessment - 11/24/19 1019    Subjective  Pt did not come back to the gym.  He was arrived, but then stated he could not participate due to feeling ill                                 PT Short Term Goals - 11/10/19 1112      PT SHORT TERM GOAL #2   Title  Pt will be able to perform sit to stand without UE support.    Status  Achieved        PT Long Term Goals - 11/10/19 1112      PT LONG TERM GOAL #2   Title  Pt will improve his 5 time sit to stand to </= 15 seconds.    Baseline  20 sec no UE first attemt; 14 sec no UE second attempt    Status  Partially Met              Patient will benefit from skilled therapeutic intervention in order to improve the following deficits and impairments:     Visit Diagnosis: Difficulty in walking,  not elsewhere classified  Muscle weakness (generalized)  Other abnormalities of gait and mobility  Chronic bilateral low back pain without sciatica     Problem List Patient Active Problem List   Diagnosis Date Noted  . Allergic conjunctivitis of both eyes 04/22/2018  . Neck pain on right side 11/03/2014  . Urinary frequency 07/27/2014  . Medicare annual wellness visit, initial 07/27/2014  . Rash and nonspecific skin eruption 07/27/2014  . Nodule, subcutaneous 07/27/2014  . Atherosclerosis of aorta ([place]) 10/25/2013  . Hyperglycemia 03/07/2013  . Status post laparoscopic cholecystectomy 03/07/2013  . 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  . Bilateral renal cysts 02/28/2012  . Hepatic cyst 02/28/2012  . Atherosclerosis 02/28/2012  . Contact dermatitis 02/28/2012  . Dermatitis 04/03/2011  . BACK PAIN, LUMBAR 10/25/2010  . NEURITIS 10/25/2010  . RHINITIS, CHRONIC 05/11/2010  . PALPITATIONS 05/09/2010  . CAROTID ARTERY DISEASE  04/08/2010  . VITAMIN D DEFICIENCY 11/02/2009  . BACK PAIN 11/02/2009  . BLADDER NECK OBSTRUCTION 07/07/2009  . ATRIAL FIBRILLATION 07/06/2009  . CAD 01/22/2009  . MUSCLE PAIN 01/22/2009  . DYSPNEA ON EXERTION 08/21/2008  . 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 HYPERGLYCEMIA, BORDERLINE 12/31/2007  . LUMBAR RADICULOPATHY, RIGHT 10/18/2007  . HX, PERSONAL, MALIGNANCY, SKIN NEC 10/18/2007  . COLONIC POLYPS, HX OF 10/18/2007  . ECHOCARDIOGRAM, ABNORMAL 08/19/2007  . HYPERLIPIDEMIA 07/25/2007  . GERD 07/25/2007  . BENIGN PROSTATIC HYPERTROPHY 07/25/2007  . HYPERTENSION 07/23/2007  . ALLERGIC RHINITIS 07/23/2007    [name], PT 11/24/2019, 10:21 AM  [name], [name], [name], [address] Phone: [phone]   Fax:  [phone]  Name: [name] MRN: [id number] Date of Birth: [birth year]  PHYSICAL THERAPY DISCHARGE SUMMARY  Visits from Start of Care: 7  Current functional level related to goals / functional outcomes: See above goals   Remaining deficits: See above   Education / Equipment: HEP  Plan: Patient agrees to discharge.  Patient goals were not met. Patient is being discharged due to not returning since the last visit.  ?????    [name], PT 02/21/20 7:47 PM

## 2019-11-25 ENCOUNTER — Encounter: Payer: Self-pay | Admitting: Internal Medicine

## 2019-11-25 ENCOUNTER — Telehealth (INDEPENDENT_AMBULATORY_CARE_PROVIDER_SITE_OTHER): Payer: Medicare Other | Admitting: Internal Medicine

## 2019-11-25 ENCOUNTER — Other Ambulatory Visit: Payer: Self-pay

## 2019-11-25 DIAGNOSIS — M479 Spondylosis, unspecified: Secondary | ICD-10-CM | POA: Diagnosis not present

## 2019-11-25 DIAGNOSIS — M62830 Muscle spasm of back: Secondary | ICD-10-CM | POA: Diagnosis not present

## 2019-11-25 DIAGNOSIS — I259 Chronic ischemic heart disease, unspecified: Secondary | ICD-10-CM

## 2019-11-25 DIAGNOSIS — R269 Unspecified abnormalities of gait and mobility: Secondary | ICD-10-CM

## 2019-11-25 MED ORDER — BACLOFEN 5 MG PO TABS: 5.0000 mg | ORAL_TABLET | Freq: Three times a day (TID) | ORAL | 0 refills | Status: DC

## 2019-11-25 NOTE — Progress Notes (Signed)
Virtual Visit via Video Note  I connected with@ on 11/25/19 at  3:30 PM EST by a video enabled telemedicine application and verified that I am speaking with the correct person using two identifiers. Location patient: home Location provider:work office Persons participating in the virtual visit: patient, provider alos wife   [employer] [place] national recommendations  regarding COVID 19 pandemic   video visit is advised over in office visit for this patient.  Patient aware  of the limitations of evaluation and management by telemedicine and  availability of in person appointments. and agreed to proceed.   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  Has a know of spasm  Asks about getting muscle relaxants   Legs feel weak no falling g Had mri spine about 18 mos ago not in town and told back arthritis  at the time pt helped   Was not a surgical problem .( didn't offer injections as a good option)     ROS: See pertinent positives and negatives per HPI.  Past Medical History:  Diagnosis Date  . Abdominal pain, right lateral 02/21/2012   progressive.    . ALLERGIC RHINITIS 07/23/2007  . Allergy   . Arthritis   . Atrial fibrillation ([place]) 07/06/2009  . BACK PAIN 11/02/2009  . BACK PAIN, LUMBAR 10/25/2010  . BENIGN PROSTATIC HYPERTROPHY 07/25/2007  . 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 CAD 01/22/2009  . Cancer ([place])    skin  . 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  . Cataract   . COLONIC POLYPS, HX OF 10/18/2007  . Cough 10/25/2010  . DIZZINESS 01/22/2009  . DYSPNEA ON EXERTION 08/21/2008  . ECHOCARDIOGRAM, ABNORMAL 08/19/2007  . GERD 07/25/2007  . Heart murmur   . HERNIA 07/06/2009  . HX, PERSONAL, MALIGNANCY, SKIN NEC 10/18/2007  . HYPERGLYCEMIA, BORDERLINE 12/31/2007  . HYPERLIPIDEMIA 07/25/2007  . HYPERTENSION 07/23/2007  . Inguinal hernia   . LUMBAR  RADICULOPATHY, RIGHT 10/18/2007  . MUSCLE PAIN 01/22/2009  . Nausea   . NEURITIS 10/25/2010  . Palpitations 05/09/2010  . RHINITIS, CHRONIC 05/11/2010  . RUQ PAIN 01/22/2009  . Sinus polyp   . UNS ADVRS EFF OTH RX MEDICINAL&BIOLOGICAL SBSTNC 04/15/2008  . 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 VITAMIN D DEFICIENCY 11/02/2009  . Voice strain     Past Surgical History:  Procedure Laterality Date  . BACK SURGERY    . basal cell and melanoma removed     face  . BONY PELVIS SURGERY    . CATARACT EXTRACTION     BIL  . COLONOSCOPY    . CORONARY ARTERY BYPASS GRAFT  2009  . HERNIA REPAIR  7 times   inguinal  . LAPAROSCOPIC CHOLECYSTECTOMY  03/03/2013  . LUMBAR SPINE SURGERY    . POLYPECTOMY    . TONSILLECTOMY      Family History  Problem Relation Age of Onset  . Lung cancer Mother   . Cancer Mother   . Heart attack Father   . Hypertension Father   . Alcohol abuse Father   . Heart disease Father   . Breast cancer Sister   . Cancer Sister        breast  . Alcohol abuse Brother   . Hypertension Brother   . Alcohol abuse Brother   . Hypertension Brother   . Alcohol abuse  Brother   . Hypertension Brother   . Kidney disease Sister   . Colon cancer Neg Hx   . Esophageal cancer Neg Hx   . Rectal cancer Neg Hx   . Stomach cancer Neg Hx     Social History   Tobacco Use  . Smoking status: Former Smoker    Types: Cigarettes    Quit date: 04/02/1964    Years since quitting: 55.6  . Smokeless tobacco: Never Used  . Tobacco comment: in early 20's he quit  Substance Use Topics  . Alcohol use: Yes    Comment: socially  . Drug use: No      Current Outpatient Medications:  .  aspirin 81 MG tablet, Take 81 mg by mouth daily.  , Disp: , Rfl:  .  baclofen 5 MG TABS, Take 5 mg by mouth 3 (three) times daily. As needed for back spasm caution for sedation, Disp: 24 tablet, Rfl: 0 .  Cholecalciferol (VITAMIN D3) 125 MCG (5000 UT) CAPS, Take 1 capsule (5,000 Units total) by mouth daily., Disp:  90 capsule, Rfl: 3 .  clobetasol (OLUX) 0.05 % topical foam, APPLY TO AFFECTED AREA TWICE A DAY, Disp: , Rfl: 10 .  Coenzyme Q10 100 MG TABS, Take 1 tablet by mouth daily., Disp: , Rfl:  .  DHA-EPA-Vitamin E (OMEGA-3 COMPLEX PO), Take 1,200 mg by mouth 3 (three) times daily. ProOmega LDL , Disp: , Rfl:  .  losartan (COZAAR) 25 MG tablet, Take 2 tablets (50 mg total) by mouth daily., Disp: 180 tablet, Rfl: 2 .  magnesium oxide (MAG-OX) 400 MG tablet, Take 400 mg daily by mouth., Disp: , Rfl:  .  metoprolol tartrate (LOPRESSOR) 50 MG tablet, TAKE 1/2 TABLET BY MOUTH IN THE MORNING AND 1 TABLET BY MOUTH IN THE EVENIG, Disp: 135 tablet, Rfl: 2 .  MULTIPLE VITAMIN PO, Take 1 tablet by mouth daily. , Disp: , Rfl:  .  MYRBETRIQ 25 MG TB24 tablet, Take 25 mg by mouth daily., Disp: , Rfl: 0 .  nitroGLYCERIN (NITROSTAT) 0.4 MG SL tablet, PLACE 1 TABLET UNDER TONGUE EVERY 5 MINUTES AS NEEDED FOR CHEST PAIN, Disp: 25 tablet, Rfl: 3 .  Omega-3 Fatty Acids (FISH OIL ADULT GUMMIES PO), Take 1 tablet by mouth daily., Disp: , Rfl:  .  RABEprazole (ACIPHEX) 20 MG tablet, Take 1 tablet (20 mg total) by mouth daily., Disp: 90 tablet, Rfl: 3 .  tamsulosin (FLOMAX) 0.4 MG CAPS capsule, Take 1 capsule (0.4 mg total) by mouth daily., Disp: 90 capsule, Rfl: 3  Current Facility-Administered Medications:  .  0.9 %  sodium chloride infusion, 500 mL, Intravenous, Continuous, Milus Banister, MD  EXAM: BP Readings from Last 3 Encounters:  10/13/19 120/68  05/28/19 137/81  02/19/19 132/68    VITALS per patient if applicable: no fever reported   GENERAL: alert, oriented, appears well and in no acute distress mild pain affect   HEENT: atraumatic, conjunttiva clear, no obvious abnormalities on inspection of external nose and ears NECK: normal movements of the head and neck LUNGS: on inspection no signs of respiratory distress, breathing rate appears normal, no obvious gross SOB, gasping or wheezing CV: no obvious  cyanosis   PSYCH/NEURO: pleasant and cooperative, no obvious depression or anxiety, speech and thought processing grossly intact Lab Results  Component Value Date   WBC 8.1 10/13/2019   HGB 15.8 10/13/2019   HCT 45.7 10/13/2019   PLT 235 10/13/2019   GLUCOSE 89 10/13/2019   CHOL  180 10/13/2019   TRIG 126 10/13/2019   HDL 38 (L) 10/13/2019   LDLDIRECT 187.2 03/31/2008   LDLCALC 119 (H) 10/13/2019   ALT 19 10/13/2019   AST 13 10/13/2019   NA 141 10/13/2019   K 4.4 10/13/2019   CL 103 10/13/2019   CREATININE 0.75 (L) 10/13/2019   BUN 19 10/13/2019   CO2 25 10/13/2019   TSH 1.640 10/13/2019   PSA 1.49 02/21/2019   INR 1.1 (H) 01/25/2012   HGBA1C 5.8 02/20/2017    ASSESSMENT AND PLAN:  Discussed the following assessment and plan:    ICD-10-CM   1. Back spasm w pain  M62.830 Ambulatory referral to Physical Medicine Rehab  2. Degenerative joint disease of low back  M47.9 Ambulatory referral to Physical Medicine Rehab  3. Gait abnormality  R26.9 Ambulatory referral to Physical Medicine Rehab  4. Gait difficulty  R26.9 Ambulatory referral to Physical Medicine Rehab   Worsening recently  Concern about  Feeling or weak legs Advise see physiatry for further assessment  High risk fo musc relaxants and caution poss for night  Only short term . If helps.  ( had some se of a pain med in remote  past)  Counseled.  At length about risk benefit of med. Can use otc patches  And topical also in interim.   Expectant management and discussion of plan and treatment with opportunity to ask questions and all were answered. The patient agreed with the plan and demonstrated an understanding of the instructions.   Advised to call back or seek an in-person evaluation if worsening  or having  further concerns . Return if symptoms worsen or fail to improve before next visit, for when planned.    Shanon Ace, MD

## 2019-11-26 ENCOUNTER — Telehealth: Payer: Medicare Other | Admitting: Internal Medicine

## 2019-11-26 ENCOUNTER — Ambulatory Visit: Payer: Medicare Other | Admitting: Physical Therapy

## 2019-12-15 ENCOUNTER — Encounter: Payer: Medicare Other | Admitting: Physical Medicine & Rehabilitation

## 2019-12-16 ENCOUNTER — Telehealth: Payer: Self-pay | Admitting: *Deleted

## 2019-12-16 NOTE — Telephone Encounter (Signed)
Pt scheduled for virtual visit 

## 2019-12-16 NOTE — Telephone Encounter (Signed)
Copied from [place] [id number]. Topic: General - Other >> Dec 16, 2019 10:29 AM Leward Quan A wrote: Reason for CRM: Patient wife called to speak to Dr Regis Bill regarding several different questions. She states that there was to also be a referral to an ENT. She states that there was a discussion while in the office few months ago and they need to conclude the discussion. Please advise Ph# [phone]

## 2019-12-17 ENCOUNTER — Encounter: Payer: Self-pay | Admitting: Internal Medicine

## 2019-12-17 ENCOUNTER — Other Ambulatory Visit: Payer: Self-pay

## 2019-12-17 ENCOUNTER — Telehealth (INDEPENDENT_AMBULATORY_CARE_PROVIDER_SITE_OTHER): Payer: Medicare Other | Admitting: Internal Medicine

## 2019-12-17 DIAGNOSIS — J329 Chronic sinusitis, unspecified: Secondary | ICD-10-CM | POA: Diagnosis not present

## 2019-12-17 DIAGNOSIS — I259 Chronic ischemic heart disease, unspecified: Secondary | ICD-10-CM | POA: Diagnosis not present

## 2019-12-17 DIAGNOSIS — M479 Spondylosis, unspecified: Secondary | ICD-10-CM | POA: Diagnosis not present

## 2019-12-17 DIAGNOSIS — M545 Low back pain, unspecified: Secondary | ICD-10-CM

## 2019-12-17 DIAGNOSIS — R499 Unspecified voice and resonance disorder: Secondary | ICD-10-CM | POA: Diagnosis not present

## 2019-12-17 DIAGNOSIS — R0683 Snoring: Secondary | ICD-10-CM

## 2019-12-17 NOTE — Progress Notes (Signed)
.  Virtual Visit via Video Note  I connected with@ on 12/17/19 at  2:00 PM EST by a video enabled telemedicine application and verified that I am speaking with the correct person using two identifiers. Location patient: home Location provider home office Persons participating in the virtual visit: patient, provider his wife   [employer] national recommendations  regarding COVID 19 pandemic   video visit is advised over in office visit for this patient.  Patient aware  of the limitations of evaluation and management by telemedicine and  availability of in person appointments. and agreed to proceed.   HPI: [name] presents for video visit with wife [name] a couple of issues. Problem #1 back problematic with his arthritis lump went away using as needed ibuprofen when the weather change but asks about which medicine is best and safest.  Has an appointment with acupuncturist Dr. Dionisio David on Friday, December 31 at [facility] may or may not need a referral please advise.  Seem to help acupuncture in the past for his back pain.   Problem #2 change in voice more difficult to speak has some chronic sinusitis separately also had episode of falling asleep sitting up and coming back right away he has loud snoring question about sleep apnea.  No difficulty swallowing Dr. Wilburn Cornelia was recommended in regard to his ENT symptoms Problem #3 questions about Covid vaccine agree with proceeding when available.    ROS: See pertinent positives and negatives per HPI.  Past Medical History:  Diagnosis Date  . Abdominal pain, right lateral 02/21/2012   progressive.    . ALLERGIC RHINITIS 07/23/2007  . Allergy   . Arthritis   . Atrial fibrillation ([place]) 07/06/2009  . BACK PAIN 11/02/2009  . BACK PAIN, LUMBAR 10/25/2010  . BENIGN PROSTATIC HYPERTROPHY 07/25/2007  . 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 CAD 01/22/2009  . Cancer ([place])    skin  . 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  . Cataract    . COLONIC POLYPS, HX OF 10/18/2007  . Cough 10/25/2010  . DIZZINESS 01/22/2009  . DYSPNEA ON EXERTION 08/21/2008  . ECHOCARDIOGRAM, ABNORMAL 08/19/2007  . GERD 07/25/2007  . Heart murmur   . HERNIA 07/06/2009  . HX, PERSONAL, MALIGNANCY, SKIN NEC 10/18/2007  . HYPERGLYCEMIA, BORDERLINE 12/31/2007  . HYPERLIPIDEMIA 07/25/2007  . HYPERTENSION 07/23/2007  . Inguinal hernia   . LUMBAR RADICULOPATHY, RIGHT 10/18/2007  . MUSCLE PAIN 01/22/2009  . Nausea   . NEURITIS 10/25/2010  . Palpitations 05/09/2010  . RHINITIS, CHRONIC 05/11/2010  . RUQ PAIN 01/22/2009  . Sinus polyp   . UNS ADVRS EFF OTH RX MEDICINAL&BIOLOGICAL SBSTNC 04/15/2008  . 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 VITAMIN D DEFICIENCY 11/02/2009  . Voice strain     Past Surgical History:  Procedure Laterality Date  . BACK SURGERY    . basal cell and melanoma removed     face  . BONY PELVIS SURGERY    . CATARACT EXTRACTION     BIL  . COLONOSCOPY    . CORONARY ARTERY BYPASS GRAFT  2009  . HERNIA REPAIR  7 times   inguinal  . LAPAROSCOPIC CHOLECYSTECTOMY  03/03/2013  . LUMBAR SPINE SURGERY    . POLYPECTOMY    . TONSILLECTOMY      Family History  Problem Relation Age of Onset  . Lung cancer Mother   . Cancer Mother   . Heart attack Father   . Hypertension Father   . Alcohol abuse Father   .  Heart disease Father   . Breast cancer Sister   . Cancer Sister        breast  . Alcohol abuse Brother   . Hypertension Brother   . Alcohol abuse Brother   . Hypertension Brother   . Alcohol abuse Brother   . Hypertension Brother   . Kidney disease Sister   . Colon cancer Neg Hx   . Esophageal cancer Neg Hx   . Rectal cancer Neg Hx   . Stomach cancer Neg Hx         EXAM: BP Readings from Last 3 Encounters:  10/13/19 120/68  05/28/19 137/81  02/19/19 132/68    VITALS per patient if applicable:  GENERAL: alert, oriented, appears well and in no acute distress  HEENT: atraumatic, conjunttiva clear, no obvious abnormalities  on inspection of external nose and ears mildly hoarse   NECK: normal movements of the head and neck  LUNGS: on inspection no signs of respiratory distress, breathing rate appears normal, no obvious gross SOB, gasping or wheezing  CV: no obvious cyanosis  MS: moves all visible extremities without noticeable abnormality  PSYCH/NEURO: pleasant and cooperative, no obvious depression or anxiety, speech and thought processing grossly intact   ASSESSMENT AND PLAN:  Discussed the following assessment and plan:    ICD-10-CM   1. Degenerative joint disease of low back  M47.9 Ambulatory referral for Acupuncture  2. Low back pain, unspecified back pain laterality, unspecified chronicity, unspecified whether sciatica present  M54.5 Ambulatory referral for Acupuncture  3. Hoarseness or changing voice  R49.9   4. Chronic sinusitis, unspecified location  J32.9   5. Snoring  R06.83    poss sleep apnea nodding off at tmes.     Counseled.  Acetaminophen probably the safest extra strength for back pain however if needed can take an occasional ibuprofen.  Agree with acupuncture trials it may have helped in the past.  We will put in as a referral but he already has an appointment.  Agree with seeing Dr. Wilburn Cornelia ENT in regard to voice change chronic sinus symptoms and also the snoring.  If needed we can refer for sleep evaluation but he may be able to address this problem at evaluation.  Discussed proceeding with Covid vaccine when available for public use. And their risk category .    Expectant management and discussion of plan and treatment with opportunity to ask questions and all were answered. The patient agreed with the plan and demonstrated an understanding of the instructions.   Advised to call back or seek an in-person evaluation if worsening  or having  further concerns . Return for as indicated.    Shanon Ace, MD

## 2019-12-29 DIAGNOSIS — M9902 Segmental and somatic dysfunction of thoracic region: Secondary | ICD-10-CM | POA: Diagnosis not present

## 2019-12-29 DIAGNOSIS — S29012A Strain of muscle and tendon of back wall of thorax, initial encounter: Secondary | ICD-10-CM | POA: Diagnosis not present

## 2019-12-29 DIAGNOSIS — S39012A Strain of muscle, fascia and tendon of lower back, initial encounter: Secondary | ICD-10-CM | POA: Diagnosis not present

## 2019-12-29 DIAGNOSIS — M9901 Segmental and somatic dysfunction of cervical region: Secondary | ICD-10-CM | POA: Diagnosis not present

## 2019-12-29 DIAGNOSIS — M9903 Segmental and somatic dysfunction of lumbar region: Secondary | ICD-10-CM | POA: Diagnosis not present

## 2019-12-29 DIAGNOSIS — S138XXA Sprain of joints and ligaments of other parts of neck, initial encounter: Secondary | ICD-10-CM | POA: Diagnosis not present

## 2019-12-31 ENCOUNTER — Other Ambulatory Visit: Payer: Self-pay | Admitting: *Deleted

## 2019-12-31 ENCOUNTER — Telehealth: Payer: Self-pay | Admitting: *Deleted

## 2019-12-31 DIAGNOSIS — M9901 Segmental and somatic dysfunction of cervical region: Secondary | ICD-10-CM | POA: Diagnosis not present

## 2019-12-31 DIAGNOSIS — M9903 Segmental and somatic dysfunction of lumbar region: Secondary | ICD-10-CM | POA: Diagnosis not present

## 2019-12-31 DIAGNOSIS — R490 Dysphonia: Secondary | ICD-10-CM

## 2019-12-31 DIAGNOSIS — S138XXA Sprain of joints and ligaments of other parts of neck, initial encounter: Secondary | ICD-10-CM | POA: Diagnosis not present

## 2019-12-31 DIAGNOSIS — S29012A Strain of muscle and tendon of back wall of thorax, initial encounter: Secondary | ICD-10-CM | POA: Diagnosis not present

## 2019-12-31 DIAGNOSIS — M9902 Segmental and somatic dysfunction of thoracic region: Secondary | ICD-10-CM | POA: Diagnosis not present

## 2019-12-31 DIAGNOSIS — S39012A Strain of muscle, fascia and tendon of lower back, initial encounter: Secondary | ICD-10-CM | POA: Diagnosis not present

## 2019-12-31 NOTE — Telephone Encounter (Signed)
S/w pt's wife per (DPR) due to a referral was put in for a ENT at pt's Oct visit for voice hoarseness (R49.0).  Dr. Victorio Palm office called pt and pt never returned phone call.  This referral was closed.  Called pt's wife to see if pt wanted to be seen by ENT and pt's wife explained pt and pt's wife had lots of phone issues and this was pt's fault. Pt would like to pursue ENT.  Will place another referral in system today.

## 2020-01-05 DIAGNOSIS — M9901 Segmental and somatic dysfunction of cervical region: Secondary | ICD-10-CM | POA: Diagnosis not present

## 2020-01-05 DIAGNOSIS — M9903 Segmental and somatic dysfunction of lumbar region: Secondary | ICD-10-CM | POA: Diagnosis not present

## 2020-01-05 DIAGNOSIS — S138XXA Sprain of joints and ligaments of other parts of neck, initial encounter: Secondary | ICD-10-CM | POA: Diagnosis not present

## 2020-01-05 DIAGNOSIS — S29012A Strain of muscle and tendon of back wall of thorax, initial encounter: Secondary | ICD-10-CM | POA: Diagnosis not present

## 2020-01-05 DIAGNOSIS — S39012A Strain of muscle, fascia and tendon of lower back, initial encounter: Secondary | ICD-10-CM | POA: Diagnosis not present

## 2020-01-05 DIAGNOSIS — M9902 Segmental and somatic dysfunction of thoracic region: Secondary | ICD-10-CM | POA: Diagnosis not present

## 2020-01-07 DIAGNOSIS — M9901 Segmental and somatic dysfunction of cervical region: Secondary | ICD-10-CM | POA: Diagnosis not present

## 2020-01-07 DIAGNOSIS — M9902 Segmental and somatic dysfunction of thoracic region: Secondary | ICD-10-CM | POA: Diagnosis not present

## 2020-01-07 DIAGNOSIS — S39012A Strain of muscle, fascia and tendon of lower back, initial encounter: Secondary | ICD-10-CM | POA: Diagnosis not present

## 2020-01-07 DIAGNOSIS — S29012A Strain of muscle and tendon of back wall of thorax, initial encounter: Secondary | ICD-10-CM | POA: Diagnosis not present

## 2020-01-07 DIAGNOSIS — S138XXA Sprain of joints and ligaments of other parts of neck, initial encounter: Secondary | ICD-10-CM | POA: Diagnosis not present

## 2020-01-07 DIAGNOSIS — M9903 Segmental and somatic dysfunction of lumbar region: Secondary | ICD-10-CM | POA: Diagnosis not present

## 2020-01-13 DIAGNOSIS — S39012A Strain of muscle, fascia and tendon of lower back, initial encounter: Secondary | ICD-10-CM | POA: Diagnosis not present

## 2020-01-13 DIAGNOSIS — S138XXA Sprain of joints and ligaments of other parts of neck, initial encounter: Secondary | ICD-10-CM | POA: Diagnosis not present

## 2020-01-13 DIAGNOSIS — S29012A Strain of muscle and tendon of back wall of thorax, initial encounter: Secondary | ICD-10-CM | POA: Diagnosis not present

## 2020-01-13 DIAGNOSIS — M9901 Segmental and somatic dysfunction of cervical region: Secondary | ICD-10-CM | POA: Diagnosis not present

## 2020-01-13 DIAGNOSIS — M9903 Segmental and somatic dysfunction of lumbar region: Secondary | ICD-10-CM | POA: Diagnosis not present

## 2020-01-13 DIAGNOSIS — M9902 Segmental and somatic dysfunction of thoracic region: Secondary | ICD-10-CM | POA: Diagnosis not present

## 2020-01-15 ENCOUNTER — Telehealth: Payer: Self-pay | Admitting: Internal Medicine

## 2020-01-15 NOTE — Progress Notes (Signed)
CARDIOLOGY OFFICE NOTE  Date:  01/20/2020    [name] Date of Birth: 1941-06-24 Medical Record [id number]  PCP:  [name], MD  Cardiologist:  Gillian Shields   Chief Complaint  Patient presents with  . Follow-up    History of Present Illness: [name] is a 79 y.o. male who presents today for a 3 month check.  Seen for Dr. Johnsie Cancel.Primarily sees me.  He has a history of known CAD with priorCABG in 2009. Other issues include HLD, BPH and HTN. He had repeat cath back in 2013 with patent grafts noted. Lesion in a small unbypassed OM to be managed medically.He has had muscle aches/pains/weakness with statin therapy. He has been on altered doses of Lipitor in the past with poor tolerance reported.  I have seen him the past several visits. He has had more in the way of palpitations. Cautious increase in beta blocker due to 1st degree AV block. Echo updated last November of 2018 as well as a Holter - these studies were stable.He has had issues with vertigo and back pain - has been found to have spinal stenosis - gabapentin and relafen made him dizzy. Last seen in the office back in February - he was a little more short of breath - not very active. Weight was up. He was using some type of supplement for his cholesterol.We did a telehealth visit back in June - he was not walking as much but was riding his bike. Trying Keto. Mostly limited by his back. No longer on Zetia due to feeing "terrible". Probably sleeping too much and watching too much TV with the [address] pandemic. Last seen in October - his wife had sent me a note with lots of concerns. Sleeping a lot. Balance poor. Lots of allergies - asking for ENT referral. Using supplements for his lipids.    The patient does not have symptoms concerning for COVID-19 infection (fever, chills, cough, or new shortness of breath).   Comes in today. Here alone. Seems to be holding his own. Has finally seen ENT - seems  to have gotten a good report. His back is bothering him - seems to be a limiting factor (has spinal stenosis). Not very active at all. No falls.  Weight is up a few pounds. Trying to get a vaccine - no luck so far. Labs from October. Overall, no worrisome concerns. Says his breathing is stable. No worrisome chest pain.   Past Medical History:  Diagnosis Date  . Abdominal pain, right lateral 02/21/2012   progressive.    . ALLERGIC RHINITIS 07/23/2007  . Allergy   . Arthritis   . Atrial fibrillation ([place]) 07/06/2009  . BACK PAIN 11/02/2009  . BACK PAIN, LUMBAR 10/25/2010  . BENIGN PROSTATIC HYPERTROPHY 07/25/2007  . 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 CAD 01/22/2009  . Cancer ([place])    skin  . 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  . Cataract   . COLONIC POLYPS, HX OF 10/18/2007  . Cough 10/25/2010  . DIZZINESS 01/22/2009  . DYSPNEA ON EXERTION 08/21/2008  . ECHOCARDIOGRAM, ABNORMAL 08/19/2007  . GERD 07/25/2007  . Heart murmur   . HERNIA 07/06/2009  . HX, PERSONAL, MALIGNANCY, SKIN NEC 10/18/2007  . HYPERGLYCEMIA, BORDERLINE 12/31/2007  . HYPERLIPIDEMIA 07/25/2007  . HYPERTENSION 07/23/2007  . Inguinal hernia   . LUMBAR RADICULOPATHY, RIGHT 10/18/2007  . MUSCLE PAIN 01/22/2009  . Nausea   . NEURITIS 10/25/2010  . Palpitations 05/09/2010  . RHINITIS, CHRONIC  05/11/2010  . RUQ PAIN 01/22/2009  . Sinus polyp   . UNS ADVRS EFF OTH RX MEDICINAL&BIOLOGICAL SBSTNC 04/15/2008  . 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 VITAMIN D DEFICIENCY 11/02/2009  . Voice strain     Past Surgical History:  Procedure Laterality Date  . BACK SURGERY    . basal cell and melanoma removed     face  . BONY PELVIS SURGERY    . CATARACT EXTRACTION     BIL  . COLONOSCOPY    . CORONARY ARTERY BYPASS GRAFT  2009  . HERNIA REPAIR  7 times   inguinal  . LAPAROSCOPIC CHOLECYSTECTOMY  03/03/2013  . LUMBAR SPINE SURGERY    . POLYPECTOMY    . TONSILLECTOMY       Medications: Current Meds  Medication Sig  . aspirin 81 MG tablet  Take 81 mg by mouth daily.    . Cholecalciferol (VITAMIN D3) 125 MCG (5000 UT) CAPS Take 1 capsule (5,000 Units total) by mouth daily.  . clobetasol (OLUX) 0.05 % topical foam APPLY TO AFFECTED AREA TWICE A DAY  . Coenzyme Q10 100 MG TABS Take 1 tablet by mouth daily.  . DHA-EPA-Vitamin E (OMEGA-3 COMPLEX PO) Take 1,200 mg by mouth 3 (three) times daily. ProOmega LDL   . losartan (COZAAR) 25 MG tablet Take 2 tablets (50 mg total) by mouth daily.  . magnesium oxide (MAG-OX) 400 MG tablet Take 400 mg daily by mouth.  . metoprolol tartrate (LOPRESSOR) 50 MG tablet TAKE 1/2 TABLET BY MOUTH IN THE MORNING AND 1 TABLET BY MOUTH IN THE EVENIG  . MULTIPLE VITAMIN PO Take 1 tablet by mouth daily.   Marland Kitchen MYRBETRIQ 25 MG TB24 tablet Take 25 mg by mouth daily.  . nitroGLYCERIN (NITROSTAT) 0.4 MG SL tablet PLACE 1 TABLET UNDER TONGUE EVERY 5 MINUTES AS NEEDED FOR CHEST PAIN  . Omega-3 Fatty Acids (FISH OIL ADULT GUMMIES PO) Take 1 tablet by mouth daily.  . RABEprazole (ACIPHEX) 20 MG tablet Take 1 tablet (20 mg total) by mouth daily.  . tamsulosin (FLOMAX) 0.4 MG CAPS capsule Take 1 capsule (0.4 mg total) by mouth daily.   Current Facility-Administered Medications for the 01/20/20 encounter (Office Visit) with Burtis Junes, NP  Medication  . 0.9 %  sodium chloride infusion     Allergies: Allergies  Allergen Reactions  . Propoxyphene N-Acetaminophen Nausea And Vomiting  . Iodinated Diagnostic Agents Swelling    Pt developed slight lt upper lip swelling only to one side about 15 minutes post IV contrast injection. No other symptoms.     Social History: The patient  reports that he quit smoking about 55 years ago. His smoking use included cigarettes. He has never used smokeless tobacco. He reports current alcohol use. He reports that he does not use drugs.   Family History: The patient's family history includes Alcohol abuse in his brother, brother, brother, and father; Breast cancer in his sister;  Cancer in his mother and sister; Heart attack in his father; Heart disease in his father; Hypertension in his brother, brother, brother, and father; Kidney disease in his sister; Lung cancer in his mother.   Review of Systems: Please see the history of present illness.   All other systems are reviewed and negative.   Physical Exam: VS:  BP (!) 146/84   Pulse 77   Ht 5' 7.5" (1.715 m)   Wt 206 lb 12.8 oz (93.8 kg)   SpO2 94%   BMI 31.91 kg/m  .  BMI Body mass index is 31.91 kg/m.  Wt Readings from Last 3 Encounters:  01/20/20 206 lb 12.8 oz (93.8 kg)  10/13/19 203 lb 9.6 oz (92.4 kg)  05/28/19 197 lb (89.4 kg)   BP is 134/80 by me.   General: Pleasant. Elderly. Alert and in no acute distress. Weight continues to creep up. Little kyphotic.  HEENT: Normal.  Neck: Supple, no JVD, carotid bruits, or masses noted.  Cardiac: Regular rate and rhythm. Soft outflow murmur. No edema.  Respiratory:  Lungs are clear to auscultation bilaterally with normal work of breathing.  GI: Soft and nontender.  MS: No deformity or atrophy. Gait and ROM intact.  Skin: Warm and dry. Color is normal.  Neuro:  Strength and sensation are intact and no gross focal deficits noted.  Psych: Alert, appropriate and with normal affect.   LABORATORY DATA:  EKG:  EKG is not ordered today.   Lab Results  Component Value Date   WBC 8.1 10/13/2019   HGB 15.8 10/13/2019   HCT 45.7 10/13/2019   PLT 235 10/13/2019   GLUCOSE 89 10/13/2019   CHOL 180 10/13/2019   TRIG 126 10/13/2019   HDL 38 (L) 10/13/2019   LDLDIRECT 187.2 03/31/2008   LDLCALC 119 (H) 10/13/2019   ALT 19 10/13/2019   AST 13 10/13/2019   NA 141 10/13/2019   K 4.4 10/13/2019   CL 103 10/13/2019   CREATININE 0.75 (L) 10/13/2019   BUN 19 10/13/2019   CO2 25 10/13/2019   TSH 1.640 10/13/2019   PSA 1.49 02/21/2019   INR 1.1 (H) 01/25/2012   HGBA1C 5.8 02/20/2017     BNP (last 3 results) No results for input(s): BNP in the last 8760  hours.  ProBNP (last 3 results) Recent Labs    02/19/19 1103 10/13/19 1523  PROBNP 139 110     Other Studies Reviewed Today:  ECHO IMPRESSIONS 09/2019   1. Left ventricular ejection fraction, by visual estimation, is 65 to 70%. The left ventricle has normal function. Normal left ventricular size. There is mildly increased left ventricular hypertrophy.  2. Left ventricular diastolic Doppler parameters are consistent with impaired relaxation pattern of LV diastolic filling.  3. Global right ventricle has normal systolic function.The right ventricular size is mildly enlarged. No increase in right ventricular wall thickness.  4. Left atrial size was mildly dilated.  5. Right atrial size was normal.  6. Mild aortic valve annular calcification.  7. The mitral valve is grossly normal. Trace mitral valve regurgitation. No evidence of mitral stenosis.  8. The tricuspid valve is normal in structure. Tricuspid valve regurgitation was not visualized by color flow Doppler.  9. The aortic valve is normal in structure. Aortic valve regurgitation was not visualized by color flow Doppler. Mild aortic valve sclerosis without stenosis. 10. There is Mild calcification of the aortic valve. 11. There is Mild thickening of the aortic valve. 12. The pulmonic valve was normal in structure. Pulmonic valve regurgitation is trivial by color flow Doppler. 13. The inferior vena cava is normal in size with greater than 50% respiratory variability, suggesting right atrial pressure of 3 mmHg.  In comparison to the previous echocardiogram(s): 11/13/17 EF 60-65%.    HolterStudy Highlights11/2018  NSR PAC;s / PVC;s No significant arrhythmias PVC;s account for less than 1% total beats    EchoStudy Conclusions11/2018  - Left ventricle: The cavity size was normal. There was moderate concentric hypertrophy. Systolic function was normal. The estimated ejection fraction was in the range of  60% to  65%. Wall motion was normal; there were no regional wall motion abnormalities. Doppler parameters are consistent with abnormal left ventricular relaxation (grade 1 diastolic dysfunction). Doppler parameters are consistent with elevated ventricular end-diastolic filling pressure. - Aortic valve: Trileaflet; normal thickness leaflets. There was no regurgitation. - Aortic root: The aortic root was normal in size. - Mitral valve: There was mild regurgitation. - Left atrium: The atrium was moderately dilated. - Right ventricle: Systolic function was normal. - Right atrium: The atrium was normal in size. - Tricuspid valve: There was trivial regurgitation. - Pulmonary arteries: Systolic pressure was within the normal range. - Inferior vena cava: The vessel was normal in size. The respirophasic diameter changes were in the normal range (= 50%), consistent with normal central venous pressure. - Pericardium, extracardiac: There was no pericardial effusion.    ASSESSMENT & PLAN:   1.CAD - remote CABG - no worrisome symptoms - not really able to do CV risk factor modification. Echo from October was stable.   2. Spinal stenosis - quite limiting for him.   3. HTN - repeat BP by me looks good - no changes made today.   4. 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 HLD - not on any RX med - just supplements. Does not tolerate. Not interested in any other therapies at this time.   6. COVID-19 Education: The signs and symptoms of COVID-19 were discussed with the patient and how to seek care for testing (follow up with PCP or arrange E-visit).  The importance of social distancing, staying at home, hand hygiene and wearing a mask when out in public were discussed today. Trying to get vaccine to no avail.   Current medicines are reviewed with the patient today.  The patient does not have concerns regarding medicines other than what has been noted above.  The following changes have  been made:  See above.  Labs/ tests ordered today include:   No orders of the defined types were placed in this encounter.    Disposition:   FU with me in 6 months.   Patient is agreeable to this plan and will call if any problems develop in the interim.   SignedTruitt Merle, NP  01/20/2020 2:59 PM  [facility] [address] [place], [place]  [address] Phone: [phone] Fax: [phone]

## 2020-01-15 NOTE — Telephone Encounter (Signed)
Send a referral to Dr. [facility]   Left shoulder pain  Bicuspal tendinis in left shoulder  Call back #: [phone]

## 2020-01-15 NOTE — Telephone Encounter (Signed)
Please advise 

## 2020-01-16 ENCOUNTER — Other Ambulatory Visit: Payer: Self-pay

## 2020-01-16 DIAGNOSIS — R490 Dysphonia: Secondary | ICD-10-CM | POA: Diagnosis not present

## 2020-01-16 DIAGNOSIS — J383 Other diseases of vocal cords: Secondary | ICD-10-CM | POA: Diagnosis not present

## 2020-01-16 DIAGNOSIS — Z7289 Other problems related to lifestyle: Secondary | ICD-10-CM | POA: Diagnosis not present

## 2020-01-16 DIAGNOSIS — M25512 Pain in left shoulder: Secondary | ICD-10-CM

## 2020-01-16 DIAGNOSIS — Z87891 Personal history of nicotine dependence: Secondary | ICD-10-CM | POA: Diagnosis not present

## 2020-01-16 NOTE — Telephone Encounter (Signed)
Pt has been notified that referral has been placed.  

## 2020-01-16 NOTE — Telephone Encounter (Signed)
Please do referral as requested 

## 2020-01-20 ENCOUNTER — Other Ambulatory Visit: Payer: Self-pay | Admitting: Nurse Practitioner

## 2020-01-20 ENCOUNTER — Other Ambulatory Visit: Payer: Self-pay

## 2020-01-20 ENCOUNTER — Telehealth: Payer: Self-pay | Admitting: *Deleted

## 2020-01-20 ENCOUNTER — Ambulatory Visit (INDEPENDENT_AMBULATORY_CARE_PROVIDER_SITE_OTHER): Payer: Medicare Other | Admitting: Nurse Practitioner

## 2020-01-20 ENCOUNTER — Encounter: Payer: Self-pay | Admitting: Nurse Practitioner

## 2020-01-20 VITALS — BP 146/84 | HR 77 | Ht 67.5 in | Wt 206.8 lb

## 2020-01-20 DIAGNOSIS — Z7189 Other specified counseling: Secondary | ICD-10-CM | POA: Diagnosis not present

## 2020-01-20 DIAGNOSIS — R5383 Other fatigue: Secondary | ICD-10-CM

## 2020-01-20 DIAGNOSIS — I259 Chronic ischemic heart disease, unspecified: Secondary | ICD-10-CM | POA: Diagnosis not present

## 2020-01-20 DIAGNOSIS — E7849 Other hyperlipidemia: Secondary | ICD-10-CM | POA: Diagnosis not present

## 2020-01-20 DIAGNOSIS — I10 Essential (primary) hypertension: Secondary | ICD-10-CM

## 2020-01-20 NOTE — Patient Instructions (Addendum)
After Visit Summary:  We will be checking the following labs today - NONE   Medication Instructions:    Continue with your current medicines.    If you need a refill on your cardiac medications before your next appointment, please call your pharmacy.     Testing/Procedures To Be Arranged:  N/A  Follow-Up:   See me in about 6 months.    At [facility], you and your health needs are our priority.  As part of our continuing mission to provide you with exceptional heart care, we have created designated Provider Care Teams.  These Care Teams include your primary Cardiologist (physician) and Advanced Practice Providers (APPs -  Physician Assistants and Nurse Practitioners) who all work together to provide you with the care you need, when you need it.  Special Instructions:  . Stay safe, stay home, wash your hands for at least 20 seconds and wear a mask when out in public.  . It was good to talk with you today.    Call the [facility] office at [phone] if you have any questions, problems or concerns.       

## 2020-01-20 NOTE — Telephone Encounter (Signed)
S/w pt's wife per (DPR). Pt does have appt today with Cecille Rubin.  Was f/u to see if pt ever got a call back from ENT.  Stated pt has appt this Friday with ENT.  Will send to [place] to [place].

## 2020-01-21 DIAGNOSIS — M9903 Segmental and somatic dysfunction of lumbar region: Secondary | ICD-10-CM | POA: Diagnosis not present

## 2020-01-21 DIAGNOSIS — M9901 Segmental and somatic dysfunction of cervical region: Secondary | ICD-10-CM | POA: Diagnosis not present

## 2020-01-21 DIAGNOSIS — S138XXA Sprain of joints and ligaments of other parts of neck, initial encounter: Secondary | ICD-10-CM | POA: Diagnosis not present

## 2020-01-21 DIAGNOSIS — S29012A Strain of muscle and tendon of back wall of thorax, initial encounter: Secondary | ICD-10-CM | POA: Diagnosis not present

## 2020-01-21 DIAGNOSIS — S39012A Strain of muscle, fascia and tendon of lower back, initial encounter: Secondary | ICD-10-CM | POA: Diagnosis not present

## 2020-01-21 DIAGNOSIS — M9902 Segmental and somatic dysfunction of thoracic region: Secondary | ICD-10-CM | POA: Diagnosis not present

## 2020-01-26 DIAGNOSIS — M25512 Pain in left shoulder: Secondary | ICD-10-CM | POA: Diagnosis not present

## 2020-01-26 DIAGNOSIS — M24812 Other specific joint derangements of left shoulder, not elsewhere classified: Secondary | ICD-10-CM | POA: Diagnosis not present

## 2020-01-26 DIAGNOSIS — M67912 Unspecified disorder of synovium and tendon, left shoulder: Secondary | ICD-10-CM | POA: Diagnosis not present

## 2020-01-27 DIAGNOSIS — S138XXA Sprain of joints and ligaments of other parts of neck, initial encounter: Secondary | ICD-10-CM | POA: Diagnosis not present

## 2020-01-27 DIAGNOSIS — S29012A Strain of muscle and tendon of back wall of thorax, initial encounter: Secondary | ICD-10-CM | POA: Diagnosis not present

## 2020-01-27 DIAGNOSIS — S39012A Strain of muscle, fascia and tendon of lower back, initial encounter: Secondary | ICD-10-CM | POA: Diagnosis not present

## 2020-01-27 DIAGNOSIS — M9903 Segmental and somatic dysfunction of lumbar region: Secondary | ICD-10-CM | POA: Diagnosis not present

## 2020-01-27 DIAGNOSIS — M7542 Impingement syndrome of left shoulder: Secondary | ICD-10-CM | POA: Diagnosis not present

## 2020-01-27 DIAGNOSIS — M9901 Segmental and somatic dysfunction of cervical region: Secondary | ICD-10-CM | POA: Diagnosis not present

## 2020-01-27 DIAGNOSIS — M9902 Segmental and somatic dysfunction of thoracic region: Secondary | ICD-10-CM | POA: Diagnosis not present

## 2020-01-27 DIAGNOSIS — M19012 Primary osteoarthritis, left shoulder: Secondary | ICD-10-CM | POA: Diagnosis not present

## 2020-01-28 DIAGNOSIS — Z961 Presence of intraocular lens: Secondary | ICD-10-CM | POA: Diagnosis not present

## 2020-01-28 DIAGNOSIS — H04123 Dry eye syndrome of bilateral lacrimal glands: Secondary | ICD-10-CM | POA: Diagnosis not present

## 2020-01-28 DIAGNOSIS — H35373 Puckering of macula, bilateral: Secondary | ICD-10-CM | POA: Diagnosis not present

## 2020-01-28 DIAGNOSIS — H0100A Unspecified blepharitis right eye, upper and lower eyelids: Secondary | ICD-10-CM | POA: Diagnosis not present

## 2020-02-02 DIAGNOSIS — M19012 Primary osteoarthritis, left shoulder: Secondary | ICD-10-CM | POA: Diagnosis not present

## 2020-02-02 DIAGNOSIS — M7542 Impingement syndrome of left shoulder: Secondary | ICD-10-CM | POA: Diagnosis not present

## 2020-02-03 DIAGNOSIS — M9901 Segmental and somatic dysfunction of cervical region: Secondary | ICD-10-CM | POA: Diagnosis not present

## 2020-02-03 DIAGNOSIS — M9902 Segmental and somatic dysfunction of thoracic region: Secondary | ICD-10-CM | POA: Diagnosis not present

## 2020-02-03 DIAGNOSIS — S39012A Strain of muscle, fascia and tendon of lower back, initial encounter: Secondary | ICD-10-CM | POA: Diagnosis not present

## 2020-02-03 DIAGNOSIS — M9903 Segmental and somatic dysfunction of lumbar region: Secondary | ICD-10-CM | POA: Diagnosis not present

## 2020-02-03 DIAGNOSIS — S29012A Strain of muscle and tendon of back wall of thorax, initial encounter: Secondary | ICD-10-CM | POA: Diagnosis not present

## 2020-02-03 DIAGNOSIS — S138XXA Sprain of joints and ligaments of other parts of neck, initial encounter: Secondary | ICD-10-CM | POA: Diagnosis not present

## 2020-02-04 DIAGNOSIS — M19012 Primary osteoarthritis, left shoulder: Secondary | ICD-10-CM | POA: Diagnosis not present

## 2020-02-04 DIAGNOSIS — M7592 Shoulder lesion, unspecified, left shoulder: Secondary | ICD-10-CM | POA: Diagnosis not present

## 2020-02-05 DIAGNOSIS — R3915 Urgency of urination: Secondary | ICD-10-CM | POA: Diagnosis not present

## 2020-02-05 DIAGNOSIS — N401 Enlarged prostate with lower urinary tract symptoms: Secondary | ICD-10-CM | POA: Diagnosis not present

## 2020-02-05 DIAGNOSIS — R3912 Poor urinary stream: Secondary | ICD-10-CM | POA: Diagnosis not present

## 2020-02-09 DIAGNOSIS — M7542 Impingement syndrome of left shoulder: Secondary | ICD-10-CM | POA: Diagnosis not present

## 2020-02-09 DIAGNOSIS — M19012 Primary osteoarthritis, left shoulder: Secondary | ICD-10-CM | POA: Diagnosis not present

## 2020-03-01 DIAGNOSIS — M9903 Segmental and somatic dysfunction of lumbar region: Secondary | ICD-10-CM | POA: Diagnosis not present

## 2020-03-01 DIAGNOSIS — S138XXA Sprain of joints and ligaments of other parts of neck, initial encounter: Secondary | ICD-10-CM | POA: Diagnosis not present

## 2020-03-01 DIAGNOSIS — S39012A Strain of muscle, fascia and tendon of lower back, initial encounter: Secondary | ICD-10-CM | POA: Diagnosis not present

## 2020-03-01 DIAGNOSIS — M9901 Segmental and somatic dysfunction of cervical region: Secondary | ICD-10-CM | POA: Diagnosis not present

## 2020-03-01 DIAGNOSIS — M9902 Segmental and somatic dysfunction of thoracic region: Secondary | ICD-10-CM | POA: Diagnosis not present

## 2020-03-01 DIAGNOSIS — S29012A Strain of muscle and tendon of back wall of thorax, initial encounter: Secondary | ICD-10-CM | POA: Diagnosis not present

## 2020-03-04 DIAGNOSIS — S138XXA Sprain of joints and ligaments of other parts of neck, initial encounter: Secondary | ICD-10-CM | POA: Diagnosis not present

## 2020-03-04 DIAGNOSIS — S39012A Strain of muscle, fascia and tendon of lower back, initial encounter: Secondary | ICD-10-CM | POA: Diagnosis not present

## 2020-03-04 DIAGNOSIS — M9901 Segmental and somatic dysfunction of cervical region: Secondary | ICD-10-CM | POA: Diagnosis not present

## 2020-03-04 DIAGNOSIS — M9902 Segmental and somatic dysfunction of thoracic region: Secondary | ICD-10-CM | POA: Diagnosis not present

## 2020-03-04 DIAGNOSIS — S29012A Strain of muscle and tendon of back wall of thorax, initial encounter: Secondary | ICD-10-CM | POA: Diagnosis not present

## 2020-03-04 DIAGNOSIS — M9903 Segmental and somatic dysfunction of lumbar region: Secondary | ICD-10-CM | POA: Diagnosis not present

## 2020-03-08 ENCOUNTER — Other Ambulatory Visit: Payer: Self-pay | Admitting: Nurse Practitioner

## 2020-03-11 DIAGNOSIS — M9903 Segmental and somatic dysfunction of lumbar region: Secondary | ICD-10-CM | POA: Diagnosis not present

## 2020-03-11 DIAGNOSIS — S29012A Strain of muscle and tendon of back wall of thorax, initial encounter: Secondary | ICD-10-CM | POA: Diagnosis not present

## 2020-03-11 DIAGNOSIS — S138XXA Sprain of joints and ligaments of other parts of neck, initial encounter: Secondary | ICD-10-CM | POA: Diagnosis not present

## 2020-03-11 DIAGNOSIS — M9901 Segmental and somatic dysfunction of cervical region: Secondary | ICD-10-CM | POA: Diagnosis not present

## 2020-03-11 DIAGNOSIS — S39012A Strain of muscle, fascia and tendon of lower back, initial encounter: Secondary | ICD-10-CM | POA: Diagnosis not present

## 2020-03-11 DIAGNOSIS — M9902 Segmental and somatic dysfunction of thoracic region: Secondary | ICD-10-CM | POA: Diagnosis not present

## 2020-03-12 ENCOUNTER — Telehealth: Payer: Self-pay | Admitting: Internal Medicine

## 2020-03-12 NOTE — Telephone Encounter (Signed)
Please advise 

## 2020-03-12 NOTE — Telephone Encounter (Signed)
Motion sickness:  Usually use  bonine or meclizie but this can cause drowsiness  And thus  Shouldn't drive with this medication.  These are OTC meds  So dont need rx    (People  have used the rx scopolomine patch  For sea sickness off label but that can cause urinary retention and dry mouth blurred vision. And not paid for  By insurance )   Alternative  Medicine have used ginger candies but dont know if works .    Ok to refill the nitro since they cant contact Cards  Please send in one refill.

## 2020-03-12 NOTE — Telephone Encounter (Signed)
Pt's spouse stated that they are driving to [place] for vacation on the second week of May. He has vertigo and during car trips he gets dizzy and nauseous. She is wondering if there is something that can be called in to help him relieve his symptoms when they go to take their trip? She said if Panosh wants to see him for this then she will be ok with that.  Pharmacy: [facility]: [phone]    His prescription for Nitroglycerin has run out and wants to know if Panosh can refill it or her advice on whether or not he should continue it? He never has had to use it. Spouse has called his Cardiologist but she has not heard anything from him and if Panosh feels this should be taken up with the cardiologist then she will.     Pt can be reached at [phone]

## 2020-03-15 NOTE — Telephone Encounter (Signed)
Patient spouse notified of update  and verbalized understanding. 

## 2020-03-16 DIAGNOSIS — S138XXA Sprain of joints and ligaments of other parts of neck, initial encounter: Secondary | ICD-10-CM | POA: Diagnosis not present

## 2020-03-16 DIAGNOSIS — M9902 Segmental and somatic dysfunction of thoracic region: Secondary | ICD-10-CM | POA: Diagnosis not present

## 2020-03-16 DIAGNOSIS — M9901 Segmental and somatic dysfunction of cervical region: Secondary | ICD-10-CM | POA: Diagnosis not present

## 2020-03-16 DIAGNOSIS — M9903 Segmental and somatic dysfunction of lumbar region: Secondary | ICD-10-CM | POA: Diagnosis not present

## 2020-03-16 DIAGNOSIS — S29012A Strain of muscle and tendon of back wall of thorax, initial encounter: Secondary | ICD-10-CM | POA: Diagnosis not present

## 2020-03-16 DIAGNOSIS — S39012A Strain of muscle, fascia and tendon of lower back, initial encounter: Secondary | ICD-10-CM | POA: Diagnosis not present

## 2020-03-30 DIAGNOSIS — M9902 Segmental and somatic dysfunction of thoracic region: Secondary | ICD-10-CM | POA: Diagnosis not present

## 2020-03-30 DIAGNOSIS — M9903 Segmental and somatic dysfunction of lumbar region: Secondary | ICD-10-CM | POA: Diagnosis not present

## 2020-03-30 DIAGNOSIS — M9901 Segmental and somatic dysfunction of cervical region: Secondary | ICD-10-CM | POA: Diagnosis not present

## 2020-03-30 DIAGNOSIS — S29012A Strain of muscle and tendon of back wall of thorax, initial encounter: Secondary | ICD-10-CM | POA: Diagnosis not present

## 2020-03-30 DIAGNOSIS — S39012A Strain of muscle, fascia and tendon of lower back, initial encounter: Secondary | ICD-10-CM | POA: Diagnosis not present

## 2020-03-30 DIAGNOSIS — S138XXA Sprain of joints and ligaments of other parts of neck, initial encounter: Secondary | ICD-10-CM | POA: Diagnosis not present

## 2020-04-06 DIAGNOSIS — S39012A Strain of muscle, fascia and tendon of lower back, initial encounter: Secondary | ICD-10-CM | POA: Diagnosis not present

## 2020-04-06 DIAGNOSIS — M9902 Segmental and somatic dysfunction of thoracic region: Secondary | ICD-10-CM | POA: Diagnosis not present

## 2020-04-06 DIAGNOSIS — S138XXA Sprain of joints and ligaments of other parts of neck, initial encounter: Secondary | ICD-10-CM | POA: Diagnosis not present

## 2020-04-06 DIAGNOSIS — M9901 Segmental and somatic dysfunction of cervical region: Secondary | ICD-10-CM | POA: Diagnosis not present

## 2020-04-06 DIAGNOSIS — M9903 Segmental and somatic dysfunction of lumbar region: Secondary | ICD-10-CM | POA: Diagnosis not present

## 2020-04-06 DIAGNOSIS — S29012A Strain of muscle and tendon of back wall of thorax, initial encounter: Secondary | ICD-10-CM | POA: Diagnosis not present

## 2020-04-13 DIAGNOSIS — M9902 Segmental and somatic dysfunction of thoracic region: Secondary | ICD-10-CM | POA: Diagnosis not present

## 2020-04-13 DIAGNOSIS — S39012A Strain of muscle, fascia and tendon of lower back, initial encounter: Secondary | ICD-10-CM | POA: Diagnosis not present

## 2020-04-13 DIAGNOSIS — M9901 Segmental and somatic dysfunction of cervical region: Secondary | ICD-10-CM | POA: Diagnosis not present

## 2020-04-13 DIAGNOSIS — S29012A Strain of muscle and tendon of back wall of thorax, initial encounter: Secondary | ICD-10-CM | POA: Diagnosis not present

## 2020-04-13 DIAGNOSIS — S138XXA Sprain of joints and ligaments of other parts of neck, initial encounter: Secondary | ICD-10-CM | POA: Diagnosis not present

## 2020-04-13 DIAGNOSIS — M9903 Segmental and somatic dysfunction of lumbar region: Secondary | ICD-10-CM | POA: Diagnosis not present

## 2020-04-18 ENCOUNTER — Other Ambulatory Visit: Payer: Self-pay | Admitting: Internal Medicine

## 2020-04-27 DIAGNOSIS — S138XXA Sprain of joints and ligaments of other parts of neck, initial encounter: Secondary | ICD-10-CM | POA: Diagnosis not present

## 2020-04-27 DIAGNOSIS — M9901 Segmental and somatic dysfunction of cervical region: Secondary | ICD-10-CM | POA: Diagnosis not present

## 2020-04-27 DIAGNOSIS — S29012A Strain of muscle and tendon of back wall of thorax, initial encounter: Secondary | ICD-10-CM | POA: Diagnosis not present

## 2020-04-27 DIAGNOSIS — S39012A Strain of muscle, fascia and tendon of lower back, initial encounter: Secondary | ICD-10-CM | POA: Diagnosis not present

## 2020-04-27 DIAGNOSIS — M9903 Segmental and somatic dysfunction of lumbar region: Secondary | ICD-10-CM | POA: Diagnosis not present

## 2020-04-27 DIAGNOSIS — M9902 Segmental and somatic dysfunction of thoracic region: Secondary | ICD-10-CM | POA: Diagnosis not present

## 2020-05-02 ENCOUNTER — Other Ambulatory Visit: Payer: Self-pay | Admitting: Internal Medicine

## 2020-05-04 DIAGNOSIS — M9901 Segmental and somatic dysfunction of cervical region: Secondary | ICD-10-CM | POA: Diagnosis not present

## 2020-05-04 DIAGNOSIS — S39012A Strain of muscle, fascia and tendon of lower back, initial encounter: Secondary | ICD-10-CM | POA: Diagnosis not present

## 2020-05-04 DIAGNOSIS — M9903 Segmental and somatic dysfunction of lumbar region: Secondary | ICD-10-CM | POA: Diagnosis not present

## 2020-05-04 DIAGNOSIS — S29012A Strain of muscle and tendon of back wall of thorax, initial encounter: Secondary | ICD-10-CM | POA: Diagnosis not present

## 2020-05-04 DIAGNOSIS — M9902 Segmental and somatic dysfunction of thoracic region: Secondary | ICD-10-CM | POA: Diagnosis not present

## 2020-05-04 DIAGNOSIS — S138XXA Sprain of joints and ligaments of other parts of neck, initial encounter: Secondary | ICD-10-CM | POA: Diagnosis not present

## 2020-05-05 ENCOUNTER — Telehealth: Payer: Self-pay | Admitting: Internal Medicine

## 2020-05-05 NOTE — Telephone Encounter (Signed)
Called wife and she stated that patient has pain in lower back across spine, bilateral hip pain, some swelling in areas, patient does not drink much water and is probably dehydrated, wants to know if can do acupuncture tomorrow at 12pm.  I have scheduled a MyChart visit at 8:30am in the morning. Sending as [name].

## 2020-05-05 NOTE — Telephone Encounter (Signed)
Patient's wife states due to arthritis in back pt can hardly walk.  She said he is requesting a muscle relaxer because they are getting ready to go to [place] on Friday morning.  He has tried acupuncture, chinese spray from acupuncture, and a heating pad.  She is requesting a call back so she will know what to do.

## 2020-05-06 ENCOUNTER — Encounter: Payer: Self-pay | Admitting: Internal Medicine

## 2020-05-06 ENCOUNTER — Other Ambulatory Visit: Payer: Self-pay

## 2020-05-06 ENCOUNTER — Telehealth (INDEPENDENT_AMBULATORY_CARE_PROVIDER_SITE_OTHER): Payer: Medicare Other | Admitting: Internal Medicine

## 2020-05-06 VITALS — BP 129/82 | HR 61 | Ht 67.5 in | Wt 200.0 lb

## 2020-05-06 DIAGNOSIS — M545 Low back pain, unspecified: Secondary | ICD-10-CM

## 2020-05-06 DIAGNOSIS — M47816 Spondylosis without myelopathy or radiculopathy, lumbar region: Secondary | ICD-10-CM | POA: Diagnosis not present

## 2020-05-06 DIAGNOSIS — S39012A Strain of muscle, fascia and tendon of lower back, initial encounter: Secondary | ICD-10-CM | POA: Diagnosis not present

## 2020-05-06 DIAGNOSIS — I259 Chronic ischemic heart disease, unspecified: Secondary | ICD-10-CM | POA: Diagnosis not present

## 2020-05-06 DIAGNOSIS — M9902 Segmental and somatic dysfunction of thoracic region: Secondary | ICD-10-CM | POA: Diagnosis not present

## 2020-05-06 DIAGNOSIS — M9903 Segmental and somatic dysfunction of lumbar region: Secondary | ICD-10-CM | POA: Diagnosis not present

## 2020-05-06 DIAGNOSIS — S29012A Strain of muscle and tendon of back wall of thorax, initial encounter: Secondary | ICD-10-CM | POA: Diagnosis not present

## 2020-05-06 MED ORDER — BACLOFEN 5 MG PO TABS: 5.0000 mg | ORAL_TABLET | Freq: Three times a day (TID) | ORAL | 0 refills | Status: DC

## 2020-05-06 NOTE — Progress Notes (Signed)
Virtual Visit via Video Note  I connected with@ on 05/06/20 at  8:30 AM EDT by a video enabled telemedicine application and verified that I am speaking with the correct person using two identifiers. Location patient: home Location provider home office Persons participating in the virtual visit: patient, provider and wife Modesta Messing national recommendations  regarding COVID 19 pandemic   video visit is advised over in office visit for this patient.  Patient aware  of the limitations of evaluation and management by telemedicine and  availability of in person appointments. and agreed to proceed.   HPI: [name] presents for video visit  For increased back pain .   Has known djd of spine and last year in [place] had eval x ray and mri   Old bulging disc  Not surgical problem   This week pain worse  Lower and muscle  spasm. Hurts when arising and walking , sitting will calm down   sometimes legs feel weak wobbly  Has to think  Or motor plan about how to turn when walking  Uses can  Wife encouraged walker but  Not using much   Wife thinks from hydration issue as avoids  Water beer and coffee. Taking tylnol and advil cbt  oil  products  Given med from  Doc last year but caused irritability se so stopped  Acupuncture x 1 and to go today  [place]  . Not driving .   No fever  Systemic sx has been immunized pfizer   ROS: See pertinent positives and negatives per HPI.  Past Medical History:  Diagnosis Date  . Abdominal pain, right lateral 02/21/2012   progressive.    . ALLERGIC RHINITIS 07/23/2007  . Allergy   . Arthritis   . Atrial fibrillation ([place]) 07/06/2009  . BACK PAIN 11/02/2009  . BACK PAIN, LUMBAR 10/25/2010  . BENIGN PROSTATIC HYPERTROPHY 07/25/2007  . 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 CAD 01/22/2009  . Cancer ([place])    skin  . 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  . Cataract   . COLONIC POLYPS, HX OF 10/18/2007  . Cough 10/25/2010  . DIZZINESS  01/22/2009  . DYSPNEA ON EXERTION 08/21/2008  . ECHOCARDIOGRAM, ABNORMAL 08/19/2007  . GERD 07/25/2007  . Heart murmur   . HERNIA 07/06/2009  . HX, PERSONAL, MALIGNANCY, SKIN NEC 10/18/2007  . HYPERGLYCEMIA, BORDERLINE 12/31/2007  . HYPERLIPIDEMIA 07/25/2007  . HYPERTENSION 07/23/2007  . Inguinal hernia   . LUMBAR RADICULOPATHY, RIGHT 10/18/2007  . MUSCLE PAIN 01/22/2009  . Nausea   . NEURITIS 10/25/2010  . Palpitations 05/09/2010  . RHINITIS, CHRONIC 05/11/2010  . RUQ PAIN 01/22/2009  . Sinus polyp   . UNS ADVRS EFF OTH RX MEDICINAL&BIOLOGICAL SBSTNC 04/15/2008  . 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 VITAMIN D DEFICIENCY 11/02/2009  . Voice strain     Past Surgical History:  Procedure Laterality Date  . BACK SURGERY    . basal cell and melanoma removed     face  . BONY PELVIS SURGERY    . CATARACT EXTRACTION     BIL  . COLONOSCOPY    . CORONARY ARTERY BYPASS GRAFT  2009  . HERNIA REPAIR  7 times   inguinal  . LAPAROSCOPIC CHOLECYSTECTOMY  03/03/2013  . LUMBAR SPINE SURGERY    . POLYPECTOMY    . TONSILLECTOMY      Family History  Problem Relation Age of Onset  . Lung cancer Mother   .  Cancer Mother   . Heart attack Father   . Hypertension Father   . Alcohol abuse Father   . Heart disease Father   . Breast cancer Sister   . Cancer Sister        breast  . Alcohol abuse Brother   . Hypertension Brother   . Alcohol abuse Brother   . Hypertension Brother   . Alcohol abuse Brother   . Hypertension Brother   . Kidney disease Sister   . Colon cancer Neg Hx   . Esophageal cancer Neg Hx   . Rectal cancer Neg Hx   . Stomach cancer Neg Hx     Social History   Tobacco Use  . Smoking status: Former Smoker    Types: Cigarettes    Quit date: 04/02/1964    Years since quitting: 56.1  . Smokeless tobacco: Never Used  . Tobacco comment: in early 20's he quit  Substance Use Topics  . Alcohol use: Yes    Comment: socially  . Drug use: No      Current Outpatient Medications:  .   aspirin 81 MG tablet, Take 81 mg by mouth daily.  , Disp: , Rfl:  .  Cholecalciferol (VITAMIN D3) 125 MCG (5000 UT) CAPS, Take 1 capsule (5,000 Units total) by mouth daily., Disp: 90 capsule, Rfl: 3 .  clobetasol (OLUX) 0.05 % topical foam, APPLY TO AFFECTED AREA TWICE A DAY, Disp: , Rfl: 10 .  Coenzyme Q10 100 MG TABS, Take 1 tablet by mouth daily., Disp: , Rfl:  .  DHA-EPA-Vitamin E (OMEGA-3 COMPLEX PO), Take 1,200 mg by mouth 3 (three) times daily. ProOmega LDL , Disp: , Rfl:  .  losartan (COZAAR) 25 MG tablet, Take 2 tablets (50 mg total) by mouth daily., Disp: 180 tablet, Rfl: 2 .  magnesium oxide (MAG-OX) 400 MG tablet, Take 400 mg daily by mouth., Disp: , Rfl:  .  metoprolol tartrate (LOPRESSOR) 50 MG tablet, TAKE 1/2 TABLET BY MOUTH IN THE MORNING AND 1 TABLET BY MOUTH IN THE EVENIG, Disp: 135 tablet, Rfl: 2 .  MULTIPLE VITAMIN PO, Take 1 tablet by mouth daily. , Disp: , Rfl:  .  MYRBETRIQ 25 MG TB24 tablet, Take 25 mg by mouth daily., Disp: , Rfl: 0 .  nitroGLYCERIN (NITROSTAT) 0.4 MG SL tablet, PLACE 1 TABLET UNDER TONGUE EVERY 5 MINUTES AS NEEDED FOR CHEST PAIN, Disp: 25 tablet, Rfl: 3 .  Omega-3 Fatty Acids (FISH OIL ADULT GUMMIES PO), Take 1 tablet by mouth daily., Disp: , Rfl:  .  RABEprazole (ACIPHEX) 20 MG tablet, TAKE 1 TABLET BY MOUTH EVERY DAY, Disp: 90 tablet, Rfl: 0 .  tamsulosin (FLOMAX) 0.4 MG CAPS capsule, TAKE 1 CAPSULE BY MOUTH EVERY DAY, Disp: 90 capsule, Rfl: 0 .  Baclofen 5 MG TABS, Take 5 mg by mouth 3 (three) times daily. As needed for back spasm caution for sedation, Disp: 24 tablet, Rfl: 0  Current Facility-Administered Medications:  .  0.9 %  sodium chloride infusion, 500 mL, Intravenous, Continuous, Milus Banister, MD  EXAM: BP Readings from Last 3 Encounters:  05/06/20 129/82  01/20/20 (!) 146/84  10/13/19 120/68    VITALS per patient if applicable:  GENERAL: alert, oriented, appears well and in no acute distress nl speech cognition looks tired    HEENT: atraumatic, conjunttiva clear, no obvious abnormalities on inspection of external nose and ears  NECK: normal movements of the head and neck LUNGS: on inspection no signs of respiratory distress,  breathing rate appears normal, no obvious gross SOB, gasping or wheezing CV: no obvious cyanosis PSYCH/NEURO: pleasant and cooperative, no obvious depression or anxiety, speech and thought processing grossly intact Lab Results  Component Value Date   WBC 8.1 10/13/2019   HGB 15.8 10/13/2019   HCT 45.7 10/13/2019   PLT 235 10/13/2019   GLUCOSE 89 10/13/2019   CHOL 180 10/13/2019   TRIG 126 10/13/2019   HDL 38 (L) 10/13/2019   LDLDIRECT 187.2 03/31/2008   LDLCALC 119 (H) 10/13/2019   ALT 19 10/13/2019   AST 13 10/13/2019   NA 141 10/13/2019   K 4.4 10/13/2019   CL 103 10/13/2019   CREATININE 0.75 (L) 10/13/2019   BUN 19 10/13/2019   CO2 25 10/13/2019   TSH 1.640 10/13/2019   PSA 1.49 02/21/2019   INR 1.1 (H) 01/25/2012   HGBA1C 5.8 02/20/2017    ASSESSMENT AND PLAN:  Discussed the following assessment and plan:    ICD-10-CM   1. Back pain, lumbosacral  M54.5   2. Osteoarthritis of lumbar spine, unspecified spinal osteoarthritis complication status  A999333   flare of known underlying  Spinal arthritis evaluated last year in [place] worse  And getting ready for trip .  No  Fever   Leg wobbliness could be from pain and  Dehydration( avoidance )   disc alarm  Sx to seek care   Keep acupuncture appt . An add baclofen to try if needed with caution disc assisted  To prevent fall  And fu  ? If injections would help if  persistent or progressive  Consider re image  Or refer continuing worsening  Counseled.   Expectant management and discussion of plan and treatment with opportunity to ask questions and all were answered. The patient agreed with the plan and demonstrated an understanding of the instructions.   Advised to call back or seek an in-person evaluation if worsening  or  having  further concerns . Return if symptoms worsen or fail to improve as expected.   Shanon Ace, MD

## 2020-05-18 ENCOUNTER — Telehealth: Payer: Self-pay | Admitting: Internal Medicine

## 2020-05-18 ENCOUNTER — Other Ambulatory Visit: Payer: Self-pay

## 2020-05-18 DIAGNOSIS — M545 Low back pain, unspecified: Secondary | ICD-10-CM

## 2020-05-18 MED ORDER — BACLOFEN 5 MG PO TABS: 5.0000 mg | ORAL_TABLET | Freq: Three times a day (TID) | ORAL | 0 refills | Status: DC

## 2020-05-18 NOTE — Telephone Encounter (Signed)
Last OV 05/06/2020 (Virtual Visit)  Last filled 05/06/2020, # 24 with 0 refills

## 2020-05-18 NOTE — Telephone Encounter (Signed)
Patient needs refill Baclosen 5 mg.  Patient will be out after today.  He had no side effects taking this medication.  Wife states his back did well on his trip taking this medication.   Patient's wife states patient is wanting a referral to a back dr at [place]-- good dr recommendation in that practice from Dr Regis Bill is welcome by the patient.  Once the referral is called in the patient wants a call back so they can go ahead and call to make an appointment.

## 2020-05-18 NOTE — Telephone Encounter (Signed)
Patient called back and spoke to [place] and gave her message from Dr. Regis Bill. Wife verbalized an understanding.

## 2020-05-18 NOTE — Telephone Encounter (Signed)
Called patient and LMOVM to return call  Left a detailed voice message for patient to call me back to go over message from Dr. Regis Bill.

## 2020-05-18 NOTE — Telephone Encounter (Signed)
Ok to refill baclofen Ok to refer to [place] dr Rolena Infante is the main spine doctor  There may be others  Not sure  They should get copy of his previous MRI etc done in [place] to evaluate

## 2020-05-26 ENCOUNTER — Telehealth: Payer: Self-pay | Admitting: Internal Medicine

## 2020-05-26 NOTE — Telephone Encounter (Signed)
Pt's wife, [name], wanted to let Dr. Regis Bill know that pt has an appt on 06/01/20 with [place]. Pt has had back pain for 3-4 weeks and would like to know if he can come in for an urine sample pt feels he might have something wrong with his kidneys. Thanks

## 2020-05-26 NOTE — Telephone Encounter (Signed)
Called patient and spoke to wife and she stated that she is not sure if they have a kidney infection or not but she is wondering because they both have back pain. Patient denies painful urination or frequent urination. Patient declined setting up an appointment with a different provider. Patient will call and schedule an appointment if symptoms occur. Patient advised an understanding.

## 2020-05-28 DIAGNOSIS — M47817 Spondylosis without myelopathy or radiculopathy, lumbosacral region: Secondary | ICD-10-CM | POA: Diagnosis not present

## 2020-05-28 DIAGNOSIS — M5126 Other intervertebral disc displacement, lumbar region: Secondary | ICD-10-CM | POA: Diagnosis not present

## 2020-05-28 DIAGNOSIS — M549 Dorsalgia, unspecified: Secondary | ICD-10-CM | POA: Diagnosis not present

## 2020-05-28 DIAGNOSIS — M48061 Spinal stenosis, lumbar region without neurogenic claudication: Secondary | ICD-10-CM | POA: Diagnosis not present

## 2020-05-28 DIAGNOSIS — N200 Calculus of kidney: Secondary | ICD-10-CM | POA: Diagnosis not present

## 2020-05-28 DIAGNOSIS — N281 Cyst of kidney, acquired: Secondary | ICD-10-CM | POA: Diagnosis not present

## 2020-05-28 DIAGNOSIS — M47816 Spondylosis without myelopathy or radiculopathy, lumbar region: Secondary | ICD-10-CM | POA: Diagnosis not present

## 2020-05-28 DIAGNOSIS — M47891 Other spondylosis, occipito-atlanto-axial region: Secondary | ICD-10-CM | POA: Diagnosis not present

## 2020-05-28 DIAGNOSIS — M545 Low back pain: Secondary | ICD-10-CM | POA: Diagnosis not present

## 2020-05-28 DIAGNOSIS — G834 Cauda equina syndrome: Secondary | ICD-10-CM | POA: Diagnosis not present

## 2020-05-28 DIAGNOSIS — R911 Solitary pulmonary nodule: Secondary | ICD-10-CM | POA: Diagnosis not present

## 2020-05-28 DIAGNOSIS — K5730 Diverticulosis of large intestine without perforation or abscess without bleeding: Secondary | ICD-10-CM | POA: Diagnosis not present

## 2020-05-28 DIAGNOSIS — N3289 Other specified disorders of bladder: Secondary | ICD-10-CM | POA: Diagnosis not present

## 2020-05-28 MED ORDER — GENERIC EXTERNAL MEDICATION
Status: DC
Start: ? — End: 2020-05-28

## 2020-05-28 MED ORDER — SODIUM CHLORIDE 0.9 % IV SOLN
10.00 | INTRAVENOUS | Status: DC
Start: ? — End: 2020-05-28

## 2020-05-29 DIAGNOSIS — G834 Cauda equina syndrome: Secondary | ICD-10-CM | POA: Diagnosis not present

## 2020-05-30 DIAGNOSIS — M5126 Other intervertebral disc displacement, lumbar region: Secondary | ICD-10-CM | POA: Diagnosis not present

## 2020-05-30 DIAGNOSIS — G834 Cauda equina syndrome: Secondary | ICD-10-CM | POA: Diagnosis not present

## 2020-05-30 DIAGNOSIS — M48061 Spinal stenosis, lumbar region without neurogenic claudication: Secondary | ICD-10-CM | POA: Diagnosis not present

## 2020-05-30 DIAGNOSIS — M47817 Spondylosis without myelopathy or radiculopathy, lumbosacral region: Secondary | ICD-10-CM | POA: Diagnosis not present

## 2020-06-06 DIAGNOSIS — G834 Cauda equina syndrome: Secondary | ICD-10-CM | POA: Diagnosis not present

## 2020-06-07 ENCOUNTER — Telehealth: Payer: Self-pay | Admitting: Nurse Practitioner

## 2020-06-07 NOTE — Telephone Encounter (Signed)
Need to know what his actual BP numbers are.   [name]

## 2020-06-07 NOTE — Telephone Encounter (Signed)
S/w pt's wife per [facility]) stated need more information on bp, send readings, at least 4 to evaluate bp.  Please send D/C summary, pt is in [facility] and getting d/c today.   Pt's wife was given fax number and the number of the phone at desk.

## 2020-06-07 NOTE — Telephone Encounter (Signed)
    Pt c/o medication issue:  1. Name of Medication:   losartan (COZAAR) 25 MG tablet    metoprolol tartrate (LOPRESSOR) 50 MG tablet    2. How are you currently taking this medication (dosage and times per day)?   3. Are you having a reaction (difficulty breathing--STAT)?   4. What is your medication issue? Pt's wife calling, she said pt has been in the hospital and will be coming home today. She would like to ask about this 2 medication because pt's BP has been low, she said his diastolic numbers 78-93. She would like to know if meds dosage can be decrease

## 2020-06-07 NOTE — Telephone Encounter (Signed)
Would favor just monitoring the blood pressure and the heart rate - can do with their BP cuff.  Call if HR is consistently less than 50 or if systolic BP consistently less than 110.  Cecille Rubin

## 2020-06-07 NOTE — Telephone Encounter (Signed)
S/w spouse per [facility]) stated it is not the pt's bp that is concerning it's pts HR.  Spouse stated charge nurse stated was taught in nursing school if pt's HR is under 60 you hold medication.  Cecille Rubin originally stated hold Losartan for two days, keep a check on bp and call us back.  Will send to Cecille Rubin to re review.

## 2020-06-07 NOTE — Telephone Encounter (Signed)
S/w pt's spouse is aware of Lori's recommendation's.  Reset pt's mychart account.  Will send mychart message next week with readings.

## 2020-06-08 DIAGNOSIS — M5126 Other intervertebral disc displacement, lumbar region: Secondary | ICD-10-CM | POA: Diagnosis not present

## 2020-06-08 DIAGNOSIS — Z7952 Long term (current) use of systemic steroids: Secondary | ICD-10-CM | POA: Diagnosis not present

## 2020-06-08 DIAGNOSIS — G834 Cauda equina syndrome: Secondary | ICD-10-CM | POA: Diagnosis not present

## 2020-06-08 DIAGNOSIS — R3915 Urgency of urination: Secondary | ICD-10-CM | POA: Diagnosis not present

## 2020-06-08 DIAGNOSIS — E785 Hyperlipidemia, unspecified: Secondary | ICD-10-CM | POA: Diagnosis not present

## 2020-06-08 DIAGNOSIS — Z4789 Encounter for other orthopedic aftercare: Secondary | ICD-10-CM | POA: Diagnosis not present

## 2020-06-08 DIAGNOSIS — M47816 Spondylosis without myelopathy or radiculopathy, lumbar region: Secondary | ICD-10-CM | POA: Diagnosis not present

## 2020-06-08 DIAGNOSIS — Z7982 Long term (current) use of aspirin: Secondary | ICD-10-CM | POA: Diagnosis not present

## 2020-06-08 DIAGNOSIS — Z9181 History of falling: Secondary | ICD-10-CM | POA: Diagnosis not present

## 2020-06-08 DIAGNOSIS — Z79891 Long term (current) use of opiate analgesic: Secondary | ICD-10-CM | POA: Diagnosis not present

## 2020-06-08 DIAGNOSIS — R32 Unspecified urinary incontinence: Secondary | ICD-10-CM | POA: Diagnosis not present

## 2020-06-08 DIAGNOSIS — I10 Essential (primary) hypertension: Secondary | ICD-10-CM | POA: Diagnosis not present

## 2020-06-08 DIAGNOSIS — Z9049 Acquired absence of other specified parts of digestive tract: Secondary | ICD-10-CM | POA: Diagnosis not present

## 2020-06-09 DIAGNOSIS — M47816 Spondylosis without myelopathy or radiculopathy, lumbar region: Secondary | ICD-10-CM | POA: Diagnosis not present

## 2020-06-09 DIAGNOSIS — R32 Unspecified urinary incontinence: Secondary | ICD-10-CM | POA: Diagnosis not present

## 2020-06-09 DIAGNOSIS — Z4789 Encounter for other orthopedic aftercare: Secondary | ICD-10-CM | POA: Diagnosis not present

## 2020-06-09 DIAGNOSIS — R3915 Urgency of urination: Secondary | ICD-10-CM | POA: Diagnosis not present

## 2020-06-09 DIAGNOSIS — G834 Cauda equina syndrome: Secondary | ICD-10-CM | POA: Diagnosis not present

## 2020-06-09 DIAGNOSIS — M5126 Other intervertebral disc displacement, lumbar region: Secondary | ICD-10-CM | POA: Diagnosis not present

## 2020-06-11 ENCOUNTER — Telehealth: Payer: Self-pay | Admitting: Internal Medicine

## 2020-06-11 ENCOUNTER — Telehealth: Payer: Self-pay | Admitting: Nurse Practitioner

## 2020-06-11 DIAGNOSIS — G834 Cauda equina syndrome: Secondary | ICD-10-CM | POA: Diagnosis not present

## 2020-06-11 DIAGNOSIS — R3915 Urgency of urination: Secondary | ICD-10-CM | POA: Diagnosis not present

## 2020-06-11 DIAGNOSIS — M47816 Spondylosis without myelopathy or radiculopathy, lumbar region: Secondary | ICD-10-CM | POA: Diagnosis not present

## 2020-06-11 DIAGNOSIS — M5126 Other intervertebral disc displacement, lumbar region: Secondary | ICD-10-CM | POA: Diagnosis not present

## 2020-06-11 DIAGNOSIS — R32 Unspecified urinary incontinence: Secondary | ICD-10-CM | POA: Diagnosis not present

## 2020-06-11 DIAGNOSIS — Z4789 Encounter for other orthopedic aftercare: Secondary | ICD-10-CM | POA: Diagnosis not present

## 2020-06-11 NOTE — Telephone Encounter (Signed)
New Message  BP Readings  123/74; 77 130/73; 63 116/68; 64 98/60; 82  Physical Therapist thinks that he needs to drink more water. Maybe dehydrating but do not drink a lot of water. Needs someone to call asap

## 2020-06-11 NOTE — Telephone Encounter (Signed)
Returned call to pt's wife to discuss BP readings. Patient is taking losartan 50 mg and metoprolol 50 mg every evening. Wife checks BP about 10:00 am daily but she did not get it this morning. Today PT checked BP and HR at  3:00 pm BP 98-60 mmHg, pulse 82. Wife states they were told by PT that pt appears dehydrated and pt reports drinking only 1 bottle of water daily. I advised wife that BP may have been lower due to pt standing rather than sitting and I asked about dizziness and lightheadedness which pt denies. I advised wife that pt needs to increase water consumption to 64 oz daily. He may add some electrolyte drinks, but avoid drinking too much sugar. Wife states she tries to get him to drink more but she will really push him, states he is difficult.  I advised her to decrease losartan to 25 mg tonight and through the weekend if his SBP < 120 mmHg. I advised I will forward message to [facility] for advice and someone will call her back next week. She verbalized understanding and agreement and thanked me for the call.

## 2020-06-14 ENCOUNTER — Other Ambulatory Visit: Payer: Self-pay | Admitting: *Deleted

## 2020-06-14 DIAGNOSIS — R3915 Urgency of urination: Secondary | ICD-10-CM | POA: Diagnosis not present

## 2020-06-14 DIAGNOSIS — M5126 Other intervertebral disc displacement, lumbar region: Secondary | ICD-10-CM | POA: Diagnosis not present

## 2020-06-14 DIAGNOSIS — G834 Cauda equina syndrome: Secondary | ICD-10-CM | POA: Diagnosis not present

## 2020-06-14 DIAGNOSIS — M47816 Spondylosis without myelopathy or radiculopathy, lumbar region: Secondary | ICD-10-CM | POA: Diagnosis not present

## 2020-06-14 DIAGNOSIS — Z4789 Encounter for other orthopedic aftercare: Secondary | ICD-10-CM | POA: Diagnosis not present

## 2020-06-14 DIAGNOSIS — R32 Unspecified urinary incontinence: Secondary | ICD-10-CM | POA: Diagnosis not present

## 2020-06-14 MED ORDER — LOSARTAN POTASSIUM 25 MG PO TABS: 25.0000 mg | ORAL_TABLET | Freq: Every day | ORAL | 2 refills | Status: DC

## 2020-06-14 NOTE — Telephone Encounter (Signed)
S/w pt's wife is aware of Lori's recommendations.  Pt will stay on one tablet by mouth ( 25 mg) daily.  Medication list updated.

## 2020-06-14 NOTE — Telephone Encounter (Signed)
Would keep the Losartan at the lower dose - 25 mg a day for now. Continue to monitor BP.  [name]

## 2020-06-15 DIAGNOSIS — R3915 Urgency of urination: Secondary | ICD-10-CM | POA: Diagnosis not present

## 2020-06-15 DIAGNOSIS — R32 Unspecified urinary incontinence: Secondary | ICD-10-CM | POA: Diagnosis not present

## 2020-06-15 DIAGNOSIS — M5126 Other intervertebral disc displacement, lumbar region: Secondary | ICD-10-CM | POA: Diagnosis not present

## 2020-06-15 DIAGNOSIS — M47816 Spondylosis without myelopathy or radiculopathy, lumbar region: Secondary | ICD-10-CM | POA: Diagnosis not present

## 2020-06-15 DIAGNOSIS — G834 Cauda equina syndrome: Secondary | ICD-10-CM | POA: Diagnosis not present

## 2020-06-15 DIAGNOSIS — Z4789 Encounter for other orthopedic aftercare: Secondary | ICD-10-CM | POA: Diagnosis not present

## 2020-06-17 DIAGNOSIS — G834 Cauda equina syndrome: Secondary | ICD-10-CM | POA: Diagnosis not present

## 2020-06-17 DIAGNOSIS — M5126 Other intervertebral disc displacement, lumbar region: Secondary | ICD-10-CM | POA: Diagnosis not present

## 2020-06-17 DIAGNOSIS — R32 Unspecified urinary incontinence: Secondary | ICD-10-CM | POA: Diagnosis not present

## 2020-06-17 DIAGNOSIS — R3915 Urgency of urination: Secondary | ICD-10-CM | POA: Diagnosis not present

## 2020-06-17 DIAGNOSIS — M47816 Spondylosis without myelopathy or radiculopathy, lumbar region: Secondary | ICD-10-CM | POA: Diagnosis not present

## 2020-06-17 DIAGNOSIS — Z4789 Encounter for other orthopedic aftercare: Secondary | ICD-10-CM | POA: Diagnosis not present

## 2020-06-21 DIAGNOSIS — M5126 Other intervertebral disc displacement, lumbar region: Secondary | ICD-10-CM | POA: Diagnosis not present

## 2020-06-21 DIAGNOSIS — Z4789 Encounter for other orthopedic aftercare: Secondary | ICD-10-CM | POA: Diagnosis not present

## 2020-06-21 DIAGNOSIS — R32 Unspecified urinary incontinence: Secondary | ICD-10-CM | POA: Diagnosis not present

## 2020-06-21 DIAGNOSIS — G834 Cauda equina syndrome: Secondary | ICD-10-CM | POA: Diagnosis not present

## 2020-06-21 DIAGNOSIS — M47816 Spondylosis without myelopathy or radiculopathy, lumbar region: Secondary | ICD-10-CM | POA: Diagnosis not present

## 2020-06-21 DIAGNOSIS — R3915 Urgency of urination: Secondary | ICD-10-CM | POA: Diagnosis not present

## 2020-06-23 DIAGNOSIS — G834 Cauda equina syndrome: Secondary | ICD-10-CM | POA: Diagnosis not present

## 2020-06-23 DIAGNOSIS — M47816 Spondylosis without myelopathy or radiculopathy, lumbar region: Secondary | ICD-10-CM | POA: Diagnosis not present

## 2020-06-23 DIAGNOSIS — Z4789 Encounter for other orthopedic aftercare: Secondary | ICD-10-CM | POA: Diagnosis not present

## 2020-06-23 DIAGNOSIS — M5126 Other intervertebral disc displacement, lumbar region: Secondary | ICD-10-CM | POA: Diagnosis not present

## 2020-06-23 DIAGNOSIS — R32 Unspecified urinary incontinence: Secondary | ICD-10-CM | POA: Diagnosis not present

## 2020-06-23 DIAGNOSIS — R3915 Urgency of urination: Secondary | ICD-10-CM | POA: Diagnosis not present

## 2020-06-24 DIAGNOSIS — R3915 Urgency of urination: Secondary | ICD-10-CM | POA: Diagnosis not present

## 2020-06-24 DIAGNOSIS — Z4789 Encounter for other orthopedic aftercare: Secondary | ICD-10-CM | POA: Diagnosis not present

## 2020-06-24 DIAGNOSIS — G834 Cauda equina syndrome: Secondary | ICD-10-CM | POA: Diagnosis not present

## 2020-06-24 DIAGNOSIS — M47816 Spondylosis without myelopathy or radiculopathy, lumbar region: Secondary | ICD-10-CM | POA: Diagnosis not present

## 2020-06-24 DIAGNOSIS — M5126 Other intervertebral disc displacement, lumbar region: Secondary | ICD-10-CM | POA: Diagnosis not present

## 2020-06-24 DIAGNOSIS — R32 Unspecified urinary incontinence: Secondary | ICD-10-CM | POA: Diagnosis not present

## 2020-06-30 DIAGNOSIS — M47816 Spondylosis without myelopathy or radiculopathy, lumbar region: Secondary | ICD-10-CM | POA: Diagnosis not present

## 2020-06-30 DIAGNOSIS — M5126 Other intervertebral disc displacement, lumbar region: Secondary | ICD-10-CM | POA: Diagnosis not present

## 2020-06-30 DIAGNOSIS — R3915 Urgency of urination: Secondary | ICD-10-CM | POA: Diagnosis not present

## 2020-06-30 DIAGNOSIS — R32 Unspecified urinary incontinence: Secondary | ICD-10-CM | POA: Diagnosis not present

## 2020-06-30 DIAGNOSIS — G834 Cauda equina syndrome: Secondary | ICD-10-CM | POA: Diagnosis not present

## 2020-06-30 DIAGNOSIS — Z4789 Encounter for other orthopedic aftercare: Secondary | ICD-10-CM | POA: Diagnosis not present

## 2020-07-01 DIAGNOSIS — M5126 Other intervertebral disc displacement, lumbar region: Secondary | ICD-10-CM | POA: Diagnosis not present

## 2020-07-01 DIAGNOSIS — M47816 Spondylosis without myelopathy or radiculopathy, lumbar region: Secondary | ICD-10-CM | POA: Diagnosis not present

## 2020-07-01 DIAGNOSIS — R3915 Urgency of urination: Secondary | ICD-10-CM | POA: Diagnosis not present

## 2020-07-01 DIAGNOSIS — Z4789 Encounter for other orthopedic aftercare: Secondary | ICD-10-CM | POA: Diagnosis not present

## 2020-07-01 DIAGNOSIS — R32 Unspecified urinary incontinence: Secondary | ICD-10-CM | POA: Diagnosis not present

## 2020-07-01 DIAGNOSIS — G834 Cauda equina syndrome: Secondary | ICD-10-CM | POA: Diagnosis not present

## 2020-07-01 NOTE — Telephone Encounter (Signed)
Error

## 2020-07-06 DIAGNOSIS — M47816 Spondylosis without myelopathy or radiculopathy, lumbar region: Secondary | ICD-10-CM | POA: Diagnosis not present

## 2020-07-06 DIAGNOSIS — Z4789 Encounter for other orthopedic aftercare: Secondary | ICD-10-CM | POA: Diagnosis not present

## 2020-07-06 DIAGNOSIS — M5126 Other intervertebral disc displacement, lumbar region: Secondary | ICD-10-CM | POA: Diagnosis not present

## 2020-07-06 DIAGNOSIS — R32 Unspecified urinary incontinence: Secondary | ICD-10-CM | POA: Diagnosis not present

## 2020-07-06 DIAGNOSIS — R3915 Urgency of urination: Secondary | ICD-10-CM | POA: Diagnosis not present

## 2020-07-06 DIAGNOSIS — G834 Cauda equina syndrome: Secondary | ICD-10-CM | POA: Diagnosis not present

## 2020-07-06 NOTE — Progress Notes (Signed)
CARDIOLOGY OFFICE NOTE  Date:  07/14/2020    [name] Date of Birth: Mar 08, 1941 Medical Record #[id number]  PCP:  [name], MD  Cardiologist:  Gillian Shields    Chief Complaint  Patient presents with  . Follow-up    History of Present Illness: [name] is a 79 y.o. male who presents today for a follow up visit. Seen for Dr. Johnsie Cancel.Primarily sees me.  He has a history of known CAD with priorCABG in 2009. Other issues include HLD, BPH and HTN. He had repeat cath back in 2013 with patent grafts noted. Lesion in a small unbypassed OM to be managed medically.He has had muscle aches/pains/weakness with statin therapy. He has been on altered doses of Lipitor in the past with poor tolerance reported.  I have followed him over the past several years. He has had some increase in palpitations with cautious increase in his beta blocker given his 1st degree AV block. He has had chronic back pain. He is not active. More limited by his back. Has not tolerated statins or Zetia in the past. Balance has gotten poor.   Last seen by me in January - trying to get vaccinated. He underwent back surgery at [facility] last month with Dr. Owens Shark at [place] - needed some medicine adjustments after he got back home due to concerns for BP/HR. Now on lower dose of ARB.   Comes in today. Here alone. Getting stronger. Less pain. Now using a cane and not a walker. No chest pain. May have had some indigestion a month or so ago. Nothing exertional reported. Weight is down a few pounds. BP is good. Little dizzy in the morning but this is chronic - nothing else that is worrisome. No falls. He does have some swelling in his lower legs - gets better overnight. Not using compression stockings. Not really elevating his legs. They eat lots of takeout/delivery and getting lots of salt.   Past Medical History:  Diagnosis Date  . Abdominal pain, right lateral 02/21/2012   progressive.    .  ALLERGIC RHINITIS 07/23/2007  . Allergy   . Arthritis   . Atrial fibrillation ([place]) 07/06/2009  . BACK PAIN 11/02/2009  . BACK PAIN, LUMBAR 10/25/2010  . BENIGN PROSTATIC HYPERTROPHY 07/25/2007  . 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 CAD 01/22/2009  . Cancer ([place])    skin  . 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  . Cataract   . COLONIC POLYPS, HX OF 10/18/2007  . Cough 10/25/2010  . DIZZINESS 01/22/2009  . DYSPNEA ON EXERTION 08/21/2008  . ECHOCARDIOGRAM, ABNORMAL 08/19/2007  . GERD 07/25/2007  . Heart murmur   . HERNIA 07/06/2009  . HX, PERSONAL, MALIGNANCY, SKIN NEC 10/18/2007  . HYPERGLYCEMIA, BORDERLINE 12/31/2007  . HYPERLIPIDEMIA 07/25/2007  . HYPERTENSION 07/23/2007  . Inguinal hernia   . LUMBAR RADICULOPATHY, RIGHT 10/18/2007  . MUSCLE PAIN 01/22/2009  . Nausea   . NEURITIS 10/25/2010  . Palpitations 05/09/2010  . RHINITIS, CHRONIC 05/11/2010  . RUQ PAIN 01/22/2009  . Sinus polyp   . UNS ADVRS EFF OTH RX MEDICINAL&BIOLOGICAL SBSTNC 04/15/2008  . 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 VITAMIN D DEFICIENCY 11/02/2009  . Voice strain     Past Surgical History:  Procedure Laterality Date  . BACK SURGERY    . basal cell and melanoma removed     face  . BONY PELVIS SURGERY    . CATARACT EXTRACTION     BIL  . COLONOSCOPY    .  CORONARY ARTERY BYPASS GRAFT  2009  . HERNIA REPAIR  7 times   inguinal  . LAPAROSCOPIC CHOLECYSTECTOMY  03/03/2013  . LUMBAR SPINE SURGERY    . POLYPECTOMY    . TONSILLECTOMY       Medications: Current Meds  Medication Sig  . aspirin 81 MG tablet Take 81 mg by mouth daily.    . Baclofen 5 MG TABS Take 5 mg by mouth 3 (three) times daily. As needed for back spasm caution for sedation  . Cholecalciferol (VITAMIN D3) 125 MCG (5000 UT) CAPS Take 1 capsule (5,000 Units total) by mouth daily.  . clobetasol (OLUX) 0.05 % topical foam APPLY TO AFFECTED AREA TWICE A DAY  . Coenzyme Q10 100 MG TABS Take 1 tablet by mouth daily.  . DHA-EPA-Vitamin E (OMEGA-3 COMPLEX PO) Take  1,200 mg by mouth 3 (three) times daily. ProOmega LDL   . magnesium oxide (MAG-OX) 400 MG tablet Take 400 mg daily by mouth.  . metoprolol tartrate (LOPRESSOR) 50 MG tablet TAKE 1/2 TABLET BY MOUTH IN THE MORNING AND 1 TABLET BY MOUTH IN THE EVENIG  . MULTIPLE VITAMIN PO Take 1 tablet by mouth daily.   Marland Kitchen MYRBETRIQ 25 MG TB24 tablet Take 25 mg by mouth daily.  . nitroGLYCERIN (NITROSTAT) 0.4 MG SL tablet PLACE 1 TABLET UNDER TONGUE EVERY 5 MINUTES AS NEEDED FOR CHEST PAIN  . Omega-3 Fatty Acids (FISH OIL ADULT GUMMIES PO) Take 1 tablet by mouth daily.  . RABEprazole (ACIPHEX) 20 MG tablet TAKE 1 TABLET BY MOUTH EVERY DAY  . tamsulosin (FLOMAX) 0.4 MG CAPS capsule TAKE 1 CAPSULE BY MOUTH EVERY DAY  . [DISCONTINUED] losartan (COZAAR) 25 MG tablet Take 1 tablet (25 mg total) by mouth daily.   Current Facility-Administered Medications for the 07/14/20 encounter (Office Visit) with Burtis Junes, NP  Medication  . 0.9 %  sodium chloride infusion     Allergies: Allergies  Allergen Reactions  . Propoxyphene N-Acetaminophen Nausea And Vomiting  . Iodinated Diagnostic Agents Swelling    Pt developed slight lt upper lip swelling only to one side about 15 minutes post IV contrast injection. No other symptoms.     Social History: The patient  reports that he quit smoking about 56 years ago. His smoking use included cigarettes. He has never used smokeless tobacco. He reports current alcohol use. He reports that he does not use drugs.   Family History: The patient's family history includes Alcohol abuse in his brother, brother, brother, and father; Breast cancer in his sister; Cancer in his mother and sister; Heart attack in his father; Heart disease in his father; Hypertension in his brother, brother, brother, and father; Kidney disease in his sister; Lung cancer in his mother.   Review of Systems: Please see the history of present illness.   All other systems are reviewed and negative.    Physical Exam: VS:  BP 110/70   Pulse 80   Ht 5' 7.5" (1.715 m)   Wt 202 lb 6.4 oz (91.8 kg)   SpO2 95%   BMI 31.23 kg/m  .  BMI Body mass index is 31.23 kg/m.  Wt Readings from Last 3 Encounters:  07/14/20 202 lb 6.4 oz (91.8 kg)  05/06/20 200 lb (90.7 kg)  01/20/20 206 lb 12.8 oz (93.8 kg)    General: Alert. He looks better to me today.    Cardiac: Regular rate and rhythm. Soft outflow murmur. Trace lower extremity edema.  Respiratory:  Lungs are clear  to auscultation bilaterally with normal work of breathing.  GI: Soft and nontender.  MS: No deformity or atrophy. Gait and ROM intact.  Skin: Warm and dry. Color is normal.  Neuro:  Strength and sensation are intact and no gross focal deficits noted.  Psych: Alert, appropriate and with normal affect.   LABORATORY DATA:  EKG:  EKG is not ordered today.    Lab Results  Component Value Date   WBC 8.1 10/13/2019   HGB 15.8 10/13/2019   HCT 45.7 10/13/2019   PLT 235 10/13/2019   GLUCOSE 89 10/13/2019   CHOL 180 10/13/2019   TRIG 126 10/13/2019   HDL 38 (L) 10/13/2019   LDLDIRECT 187.2 03/31/2008   LDLCALC 119 (H) 10/13/2019   ALT 19 10/13/2019   AST 13 10/13/2019   NA 141 10/13/2019   K 4.4 10/13/2019   CL 103 10/13/2019   CREATININE 0.75 (L) 10/13/2019   BUN 19 10/13/2019   CO2 25 10/13/2019   TSH 1.640 10/13/2019   PSA 1.49 02/21/2019   INR 1.1 (H) 01/25/2012   HGBA1C 5.8 02/20/2017     BNP (last 3 results) No results for input(s): BNP in the last 8760 hours.  ProBNP (last 3 results) Recent Labs    10/13/19 1523  PROBNP 110     Other Studies Reviewed Today:  ECHO IMPRESSIONS 09/2019  1. Left ventricular ejection fraction, by visual estimation, is 65 to 70%. The left ventricle has normal function. Normal left ventricular size. There is mildly increased left ventricular hypertrophy. 2. Left ventricular diastolic Doppler parameters are consistent with impaired relaxation pattern of LV  diastolic filling. 3. Global right ventricle has normal systolic function.The right ventricular size is mildly enlarged. No increase in right ventricular wall thickness. 4. Left atrial size was mildly dilated. 5. Right atrial size was normal. 6. Mild aortic valve annular calcification. 7. The mitral valve is grossly normal. Trace mitral valve regurgitation. No evidence of mitral stenosis. 8. The tricuspid valve is normal in structure. Tricuspid valve regurgitation was not visualized by color flow Doppler. 9. The aortic valve is normal in structure. Aortic valve regurgitation was not visualized by color flow Doppler. Mild aortic valve sclerosis without stenosis. 10. There is Mild calcification of the aortic valve. 11. There is Mild thickening of the aortic valve. 12. The pulmonic valve was normal in structure. Pulmonic valve regurgitation is trivial by color flow Doppler. 13. The inferior vena cava is normal in size with greater than 50% respiratory variability, suggesting right atrial pressure of 3 mmHg.  In comparison to the previous echocardiogram(s): 11/13/17 EF 60-65%.    HolterStudy Highlights11/2018  NSR PAC;s / PVC;s No significant arrhythmias PVC;s account for less than 1% total beats    EchoStudy Conclusions11/2018  - Left ventricle: The cavity size was normal. There was moderate concentric hypertrophy. Systolic function was normal. The estimated ejection fraction was in the range of 60% to 65%. Wall motion was normal; there were no regional wall motion abnormalities. Doppler parameters are consistent with abnormal left ventricular relaxation (grade 1 diastolic dysfunction). Doppler parameters are consistent with elevated ventricular end-diastolic filling pressure. - Aortic valve: Trileaflet; normal thickness leaflets. There was no regurgitation. - Aortic root: The aortic root was normal in size. - Mitral valve: There was mild  regurgitation. - Left atrium: The atrium was moderately dilated. - Right ventricle: Systolic function was normal. - Right atrium: The atrium was normal in size. - Tricuspid valve: There was trivial regurgitation. - Pulmonary arteries: Systolic pressure  was within the normal range. - Inferior vena cava: The vessel was normal in size. The respirophasic diameter changes were in the normal range (= 50%), consistent with normal central venous pressure. - Pericardium, extracardiac: There was no pericardial effusion.    ASSESSMENT & PLAN:   1. CAD - remote CABG - no exertional symptoms - seems like he is actually able to do more given this recent back surgery.   2. HTN - BP remains little soft - stopping ARB - he will continue to monitor.   3. Spinal stenosis - has just had back surgery - significant pain relief - more mobile - he looks better to me today.   4. 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   5. HLD - he does not tolerate medicines nor does he want to retry.   6. 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   7. Lower extremity edema - needs support stockings, elevate his legs and cut back on the salt use. I think this will help - if fails to resolve, then consider low dose diuretic therapy.   Current medicines are reviewed with the patient today.  The patient does not have concerns regarding medicines other than what has been noted above.  The following changes have been made:  See above.  Labs/ tests ordered today include:   No orders of the defined types were placed in this encounter.    Disposition:   FU with me in 2 months.   Patient is agreeable to this plan and will call if any problems develop in the interim.   SignedTruitt Merle, NP  07/14/2020 2:00 PM  [facility] [address] [place],   [address] Phone: [phone] Fax: [phone]

## 2020-07-08 DIAGNOSIS — R3915 Urgency of urination: Secondary | ICD-10-CM | POA: Diagnosis not present

## 2020-07-08 DIAGNOSIS — Z4789 Encounter for other orthopedic aftercare: Secondary | ICD-10-CM | POA: Diagnosis not present

## 2020-07-08 DIAGNOSIS — M5126 Other intervertebral disc displacement, lumbar region: Secondary | ICD-10-CM | POA: Diagnosis not present

## 2020-07-08 DIAGNOSIS — I10 Essential (primary) hypertension: Secondary | ICD-10-CM | POA: Diagnosis not present

## 2020-07-08 DIAGNOSIS — R32 Unspecified urinary incontinence: Secondary | ICD-10-CM | POA: Diagnosis not present

## 2020-07-08 DIAGNOSIS — E785 Hyperlipidemia, unspecified: Secondary | ICD-10-CM | POA: Diagnosis not present

## 2020-07-08 DIAGNOSIS — G834 Cauda equina syndrome: Secondary | ICD-10-CM | POA: Diagnosis not present

## 2020-07-08 DIAGNOSIS — Z7952 Long term (current) use of systemic steroids: Secondary | ICD-10-CM | POA: Diagnosis not present

## 2020-07-08 DIAGNOSIS — Z79891 Long term (current) use of opiate analgesic: Secondary | ICD-10-CM | POA: Diagnosis not present

## 2020-07-08 DIAGNOSIS — Z9181 History of falling: Secondary | ICD-10-CM | POA: Diagnosis not present

## 2020-07-08 DIAGNOSIS — Z9049 Acquired absence of other specified parts of digestive tract: Secondary | ICD-10-CM | POA: Diagnosis not present

## 2020-07-08 DIAGNOSIS — Z7982 Long term (current) use of aspirin: Secondary | ICD-10-CM | POA: Diagnosis not present

## 2020-07-08 DIAGNOSIS — M47816 Spondylosis without myelopathy or radiculopathy, lumbar region: Secondary | ICD-10-CM | POA: Diagnosis not present

## 2020-07-11 DIAGNOSIS — Z4789 Encounter for other orthopedic aftercare: Secondary | ICD-10-CM | POA: Diagnosis not present

## 2020-07-11 DIAGNOSIS — M47816 Spondylosis without myelopathy or radiculopathy, lumbar region: Secondary | ICD-10-CM | POA: Diagnosis not present

## 2020-07-11 DIAGNOSIS — G834 Cauda equina syndrome: Secondary | ICD-10-CM | POA: Diagnosis not present

## 2020-07-11 DIAGNOSIS — R3915 Urgency of urination: Secondary | ICD-10-CM | POA: Diagnosis not present

## 2020-07-11 DIAGNOSIS — M5126 Other intervertebral disc displacement, lumbar region: Secondary | ICD-10-CM | POA: Diagnosis not present

## 2020-07-11 DIAGNOSIS — R32 Unspecified urinary incontinence: Secondary | ICD-10-CM | POA: Diagnosis not present

## 2020-07-12 DIAGNOSIS — M5126 Other intervertebral disc displacement, lumbar region: Secondary | ICD-10-CM | POA: Diagnosis not present

## 2020-07-12 DIAGNOSIS — M47816 Spondylosis without myelopathy or radiculopathy, lumbar region: Secondary | ICD-10-CM | POA: Diagnosis not present

## 2020-07-12 DIAGNOSIS — R32 Unspecified urinary incontinence: Secondary | ICD-10-CM | POA: Diagnosis not present

## 2020-07-12 DIAGNOSIS — G834 Cauda equina syndrome: Secondary | ICD-10-CM | POA: Diagnosis not present

## 2020-07-12 DIAGNOSIS — R3915 Urgency of urination: Secondary | ICD-10-CM | POA: Diagnosis not present

## 2020-07-12 DIAGNOSIS — Z4789 Encounter for other orthopedic aftercare: Secondary | ICD-10-CM | POA: Diagnosis not present

## 2020-07-14 ENCOUNTER — Encounter: Payer: Self-pay | Admitting: Nurse Practitioner

## 2020-07-14 ENCOUNTER — Other Ambulatory Visit: Payer: Self-pay

## 2020-07-14 ENCOUNTER — Ambulatory Visit (INDEPENDENT_AMBULATORY_CARE_PROVIDER_SITE_OTHER): Payer: Medicare Other | Admitting: Nurse Practitioner

## 2020-07-14 VITALS — BP 110/70 | HR 80 | Ht 67.5 in | Wt 202.4 lb

## 2020-07-14 DIAGNOSIS — I259 Chronic ischemic heart disease, unspecified: Secondary | ICD-10-CM

## 2020-07-14 DIAGNOSIS — E7849 Other hyperlipidemia: Secondary | ICD-10-CM | POA: Diagnosis not present

## 2020-07-14 DIAGNOSIS — I10 Essential (primary) hypertension: Secondary | ICD-10-CM | POA: Diagnosis not present

## 2020-07-14 DIAGNOSIS — R011 Cardiac murmur, unspecified: Secondary | ICD-10-CM | POA: Diagnosis not present

## 2020-07-14 NOTE — Patient Instructions (Addendum)
After Visit Summary:  We will be checking the following labs today - BMET and CBC   Medication Instructions:   Continue with your current medicines. BUT I am stopping the Losartan altogether for now.    If you need a refill on your cardiac medications before your next appointment, please call your pharmacy.     Testing/Procedures To Be Arranged:  N/A  Follow-Up:   See me in about 2 months    At [facility], you and your health needs are our priority.  As part of our continuing mission to provide you with exceptional heart care, we have created designated Provider Care Teams.  These Care Teams include your primary Cardiologist (physician) and Advanced Practice Providers (APPs -  Physician Assistants and Nurse Practitioners) who all work together to provide you with the care you need, when you need it.  Special Instructions:  . Stay safe, wash your hands for at least 20 seconds and wear a mask when needed.  . It was good to talk with you today.  Marland Kitchen Keep a check on your BP for me . Wear knee high compression stockings during the day - this will help with the swelling in your legs/feet . Elevate your legs during the day if you can . Really need to cut back to the salt - cut back on the take out food - this has too much salt and makes your legs swell.   Call the [place] office at [phone] if you have any questions, problems or concerns.

## 2020-07-15 LAB — BASIC METABOLIC PANEL
BUN/Creatinine Ratio: 21 (ref 10–24)
BUN: 15 mg/dL (ref 8–27)
CO2: 22 mmol/L (ref 20–29)
Calcium: 9.5 mg/dL (ref 8.6–10.2)
Chloride: 104 mmol/L (ref 96–106)
Creatinine, Ser: 0.73 mg/dL — ABNORMAL LOW (ref 0.76–1.27)
GFR calc Af Amer: 101 mL/min/{1.73_m2} (ref >59)
GFR calc non Af Amer: 88 mL/min/{1.73_m2} (ref >59)
Glucose: 163 mg/dL — ABNORMAL HIGH (ref 65–99)
Potassium: 4.2 mmol/L (ref 3.5–5.2)
Sodium: 141 mmol/L (ref 134–144)

## 2020-07-15 LAB — CBC
Hematocrit: 43.6 % (ref 37.5–51.0)
Hemoglobin: 15.1 g/dL (ref 13.0–17.7)
MCH: 32.7 pg (ref 26.6–33.0)
MCHC: 34.6 g/dL (ref 31.5–35.7)
MCV: 94 fL (ref 79–97)
Platelets: 271 10*3/uL (ref 150–450)
RBC: 4.62 x10E6/uL (ref 4.14–5.80)
RDW: 13.3 % (ref 11.6–15.4)
WBC: 7.5 10*3/uL (ref 3.4–10.8)

## 2020-07-18 ENCOUNTER — Other Ambulatory Visit: Payer: Self-pay | Admitting: Internal Medicine

## 2020-07-22 DIAGNOSIS — M47816 Spondylosis without myelopathy or radiculopathy, lumbar region: Secondary | ICD-10-CM | POA: Diagnosis not present

## 2020-07-22 DIAGNOSIS — M5126 Other intervertebral disc displacement, lumbar region: Secondary | ICD-10-CM | POA: Diagnosis not present

## 2020-07-22 DIAGNOSIS — Z4789 Encounter for other orthopedic aftercare: Secondary | ICD-10-CM | POA: Diagnosis not present

## 2020-07-22 DIAGNOSIS — R32 Unspecified urinary incontinence: Secondary | ICD-10-CM | POA: Diagnosis not present

## 2020-07-22 DIAGNOSIS — R3915 Urgency of urination: Secondary | ICD-10-CM | POA: Diagnosis not present

## 2020-07-22 DIAGNOSIS — G834 Cauda equina syndrome: Secondary | ICD-10-CM | POA: Diagnosis not present

## 2020-07-26 DIAGNOSIS — G834 Cauda equina syndrome: Secondary | ICD-10-CM | POA: Diagnosis not present

## 2020-07-26 DIAGNOSIS — Z4789 Encounter for other orthopedic aftercare: Secondary | ICD-10-CM | POA: Diagnosis not present

## 2020-07-26 DIAGNOSIS — R3915 Urgency of urination: Secondary | ICD-10-CM | POA: Diagnosis not present

## 2020-07-26 DIAGNOSIS — M47816 Spondylosis without myelopathy or radiculopathy, lumbar region: Secondary | ICD-10-CM | POA: Diagnosis not present

## 2020-07-26 DIAGNOSIS — R32 Unspecified urinary incontinence: Secondary | ICD-10-CM | POA: Diagnosis not present

## 2020-07-26 DIAGNOSIS — M5126 Other intervertebral disc displacement, lumbar region: Secondary | ICD-10-CM | POA: Diagnosis not present

## 2020-07-27 DIAGNOSIS — Z9889 Other specified postprocedural states: Secondary | ICD-10-CM | POA: Diagnosis not present

## 2020-08-03 DIAGNOSIS — Z4789 Encounter for other orthopedic aftercare: Secondary | ICD-10-CM | POA: Diagnosis not present

## 2020-08-03 DIAGNOSIS — R32 Unspecified urinary incontinence: Secondary | ICD-10-CM | POA: Diagnosis not present

## 2020-08-03 DIAGNOSIS — G834 Cauda equina syndrome: Secondary | ICD-10-CM | POA: Diagnosis not present

## 2020-08-03 DIAGNOSIS — R3915 Urgency of urination: Secondary | ICD-10-CM | POA: Diagnosis not present

## 2020-08-03 DIAGNOSIS — M5126 Other intervertebral disc displacement, lumbar region: Secondary | ICD-10-CM | POA: Diagnosis not present

## 2020-08-03 DIAGNOSIS — M47816 Spondylosis without myelopathy or radiculopathy, lumbar region: Secondary | ICD-10-CM | POA: Diagnosis not present

## 2020-08-06 ENCOUNTER — Encounter: Payer: Self-pay | Admitting: Physician Assistant

## 2020-08-06 ENCOUNTER — Other Ambulatory Visit: Payer: Self-pay

## 2020-08-06 ENCOUNTER — Ambulatory Visit (INDEPENDENT_AMBULATORY_CARE_PROVIDER_SITE_OTHER): Payer: Medicare Other | Admitting: Physician Assistant

## 2020-08-06 DIAGNOSIS — L578 Other skin changes due to chronic exposure to nonionizing radiation: Secondary | ICD-10-CM | POA: Diagnosis not present

## 2020-08-06 DIAGNOSIS — L719 Rosacea, unspecified: Secondary | ICD-10-CM | POA: Diagnosis not present

## 2020-08-06 DIAGNOSIS — L821 Other seborrheic keratosis: Secondary | ICD-10-CM | POA: Diagnosis not present

## 2020-08-06 DIAGNOSIS — D1800 Hemangioma unspecified site: Secondary | ICD-10-CM | POA: Diagnosis not present

## 2020-08-06 DIAGNOSIS — L814 Other melanin hyperpigmentation: Secondary | ICD-10-CM | POA: Diagnosis not present

## 2020-08-06 DIAGNOSIS — L309 Dermatitis, unspecified: Secondary | ICD-10-CM | POA: Diagnosis not present

## 2020-08-06 DIAGNOSIS — Z85828 Personal history of other malignant neoplasm of skin: Secondary | ICD-10-CM | POA: Diagnosis not present

## 2020-08-06 DIAGNOSIS — I259 Chronic ischemic heart disease, unspecified: Secondary | ICD-10-CM

## 2020-08-06 DIAGNOSIS — Z1283 Encounter for screening for malignant neoplasm of skin: Secondary | ICD-10-CM | POA: Diagnosis not present

## 2020-08-06 MED ORDER — METRONIDAZOLE 0.75 % EX CREA: TOPICAL_CREAM | CUTANEOUS | 0 refills | Status: DC

## 2020-08-06 MED ORDER — CLOBETASOL PROPIONATE 0.05 % EX FOAM: CUTANEOUS | 10 refills | Status: DC

## 2020-08-06 NOTE — Patient Instructions (Signed)
Use clobetasol on leg and back  Use metronidazole cream on face

## 2020-08-10 NOTE — Progress Notes (Signed)
   Follow-Up Visit   Subjective  [name] is a 79 y.o. male who presents for the following: Annual Exam.   The following portions of the chart were reviewed this encounter and updated as appropriate: Tobacco  Allergies  Meds  Problems  Med Hx  Surg Hx  Fam Hx      Objective  Well appearing patient in no apparent distress; mood and affect are within normal limits.  A full examination was performed including scalp, head, eyes, ears, nose, lips, neck, chest, axillae, abdomen, back, buttocks, bilateral upper extremities, bilateral lower extremities, hands, feet, fingers, toes, fingernails, and toenails. All findings within normal limits unless otherwise noted below.  Objective  Left Upper Back, Right Thigh - Posterior: All scars clear  Objective  Left Lower Leg - Anterior: Thin scaly erythematous papules coalescing to plaques.   Objective  Head - Anterior (Face): Centrifacial erythema with or without papules/pustules.   Objective  Head - Anterior (Face): No atypical nevi No signs of non-mole skin cancer.   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 - Observe - Call for any changes   Hemangiomas - Red papules - Discussed benign nature - Observe - Call for any changes  Actinic Damage - diffuse scaly erythematous macules with underlying dyspigmentation - Recommend daily broad spectrum sunscreen SPF 30+ to  sun-exposed areas, reapply every 2 hours as needed.  - Call for new or changing lesions.  Skin cancer screening performed today.   I, Davey Limas, PA-C, have reviewed all documentation's for this visit.  The documentation on 08/10/20 for the exam, diagnosis, procedures and orders are all accurate and complete.

## 2020-08-23 ENCOUNTER — Other Ambulatory Visit: Payer: Self-pay

## 2020-08-23 ENCOUNTER — Ambulatory Visit (INDEPENDENT_AMBULATORY_CARE_PROVIDER_SITE_OTHER): Payer: Medicare Other | Admitting: Internal Medicine

## 2020-08-23 ENCOUNTER — Encounter: Payer: Self-pay | Admitting: Internal Medicine

## 2020-08-23 VITALS — BP 140/86 | HR 72 | Temp 97.7°F | Ht 67.5 in | Wt 203.0 lb

## 2020-08-23 DIAGNOSIS — I259 Chronic ischemic heart disease, unspecified: Secondary | ICD-10-CM | POA: Diagnosis not present

## 2020-08-23 DIAGNOSIS — M5431 Sciatica, right side: Secondary | ICD-10-CM | POA: Diagnosis not present

## 2020-08-23 DIAGNOSIS — M549 Dorsalgia, unspecified: Secondary | ICD-10-CM

## 2020-08-23 DIAGNOSIS — Z9889 Other specified postprocedural states: Secondary | ICD-10-CM

## 2020-08-23 DIAGNOSIS — Z23 Encounter for immunization: Secondary | ICD-10-CM | POA: Diagnosis not present

## 2020-08-23 MED ORDER — BACLOFEN 5 MG PO TABS: 5.0000 mg | ORAL_TABLET | Freq: Three times a day (TID) | ORAL | 0 refills | Status: DC

## 2020-08-23 NOTE — Patient Instructions (Addendum)
Will refill the baclofen for now But we need you to see   Dr Owens Shark team . Because this pain is   Ms back pain .  Will send then a message for  Getting in  To be seen by dr Owens Shark   Consider. gabapentin  Also  If needed let me know if you want to try.  In interim .

## 2020-08-23 NOTE — Progress Notes (Signed)
Chief Complaint  Patient presents with  . Back Pain    lower right side, new pain, started last Wednesday and he was doing his exercises and felt a twinge    HPI: [name] 79 y.o. come in for  sda new problem    Had surgery ls spine  for cauda equina   Sx  In June and rehabs  Doing well until last wed August 25 when doing leg lifts in bed as  Usual had twinge right low back and since then has progresses  Down buttock to ant thigh and now past knee   No new weakness but worse when laying down  No falling ir injury pain is becoming severe  Some twitchings in ant thigh .  Tried tizanidine per surgeyr and no help  Baclofen a bitt better  Asks for refill   No fever  Gi gu changes  Uses cane and walker when tired   Last check 8/ 3 with dr brown  And  Released from care .  ROS: See pertinent positives and negatives per HPI.  Past Medical History:  Diagnosis Date  . Abdominal pain, right lateral 02/21/2012   progressive.    . ALLERGIC RHINITIS 07/23/2007  . Allergy   . Arthritis   . Atrial fibrillation ([place]) 07/06/2009  . Atypical mole 09/13/1992   left shoulder Dr Cheryln Manly exc  . Atypical nevi 10/08/1992   right abdomen Dr Francisca December  . BACK PAIN 11/02/2009  . BACK PAIN, LUMBAR 10/25/2010  . Basal cell carcinoma 08/06/1992   right post thigh Dr Cheryln Manly  . BCC (basal cell carcinoma of skin) 04/23/2018   left post shoulder tx with bx  . BENIGN PROSTATIC HYPERTROPHY 07/25/2007  . 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 CAD 01/22/2009  . Cancer ([place])    skin  . 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  . Cataract   . COLONIC POLYPS, HX OF 10/18/2007  . Cough 10/25/2010  . DIZZINESS 01/22/2009  . DYSPNEA ON EXERTION 08/21/2008  . ECHOCARDIOGRAM, ABNORMAL 08/19/2007  . GERD 07/25/2007  . Heart murmur   . HERNIA 07/06/2009  . HX, PERSONAL, MALIGNANCY, SKIN NEC 10/18/2007  . HYPERGLYCEMIA, BORDERLINE 12/31/2007  . HYPERLIPIDEMIA 07/25/2007  . HYPERTENSION 07/23/2007  . Inguinal hernia   .  LUMBAR RADICULOPATHY, RIGHT 10/18/2007  . MUSCLE PAIN 01/22/2009  . Nausea   . NEURITIS 10/25/2010  . Palpitations 05/09/2010  . RHINITIS, CHRONIC 05/11/2010  . RUQ PAIN 01/22/2009  . Sinus polyp   . UNS ADVRS EFF OTH RX MEDICINAL&BIOLOGICAL SBSTNC 04/15/2008  . 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 VITAMIN D DEFICIENCY 11/02/2009  . Voice strain     Family History  Problem Relation Age of Onset  . Lung cancer Mother   . Cancer Mother   . Heart attack Father   . Hypertension Father   . Alcohol abuse Father   . Heart disease Father   . Breast cancer Sister   . Cancer Sister        breast  . Alcohol abuse Brother   . Hypertension Brother   . Alcohol abuse Brother   . Hypertension Brother   . Alcohol abuse Brother   . Hypertension Brother   . Kidney disease Sister   . Colon cancer Neg Hx   . Esophageal cancer Neg Hx   . Rectal cancer Neg Hx   . Stomach cancer Neg Hx     Social History   Socioeconomic History  . Marital status: Married  Spouse name: Not on file  . Number of children: Not on file  . Years of education: Not on file  . Highest education level: Not on file  Occupational History  . Not on file  Tobacco Use  . Smoking status: Former Smoker    Types: Cigarettes    Quit date: 04/02/1964    Years since quitting: 56.4  . Smokeless tobacco: Never Used  . Tobacco comment: in early 20's he quit  Vaping Use  . Vaping Use: Never used  Substance and Sexual Activity  . Alcohol use: Yes    Comment: socially  . Drug use: No  . Sexual activity: Not on file  Other Topics Concern  . Not on file  Social History Narrative   Retired [employer]   Married   Non smoker some etoh   Travels a lot            Social Determinants of Radio broadcast assistant Strain:   . Difficulty of Paying Living Expenses: Not on file  Food Insecurity:   . Worried About Charity fundraiser in the Last Year: Not on file  . Ran Out of Food in the Last Year: Not on file  Transportation  Needs:   . Lack of Transportation (Medical): Not on file  . Lack of Transportation (Non-Medical): Not on file  Physical Activity:   . Days of Exercise per Week: Not on file  . Minutes of Exercise per Session: Not on file  Stress:   . Feeling of Stress : Not on file  Social Connections:   . Frequency of Communication with Friends and Family: Not on file  . Frequency of Social Gatherings with Friends and Family: Not on file  . Attends Religious Services: Not on file  . Active Member of Clubs or Organizations: Not on file  . Attends Archivist Meetings: Not on file  . Marital Status: Not on file    Outpatient Medications Prior to Visit  Medication Sig Dispense Refill  . acetaminophen (TYLENOL) 500 MG tablet Take 500 mg by mouth in the morning and at bedtime.    Marland Kitchen aspirin 81 MG tablet Take 81 mg by mouth daily.      . Cholecalciferol (VITAMIN D3) 125 MCG (5000 UT) CAPS Take 1 capsule (5,000 Units total) by mouth daily. 90 capsule 3  . clobetasol (OLUX) 0.05 % topical foam APPLY to back and leg 50 g 10  . Coenzyme Q10 100 MG TABS Take 1 tablet by mouth daily.    . DHA-EPA-Vitamin E (OMEGA-3 COMPLEX PO) Take 1,200 mg by mouth 3 (three) times daily. ProOmega LDL     . magnesium oxide (MAG-OX) 400 MG tablet Take 400 mg daily by mouth.    . metoprolol tartrate (LOPRESSOR) 50 MG tablet TAKE 1/2 TABLET BY MOUTH IN THE MORNING AND 1 TABLET BY MOUTH IN THE EVENIG 135 tablet 2  . metroNIDAZOLE (METROCREAM) 0.75 % cream Apply to face 45 g 0  . MULTIPLE VITAMIN PO Take 1 tablet by mouth daily.     Marland Kitchen MYRBETRIQ 25 MG TB24 tablet Take 25 mg by mouth daily.  0  . nitroGLYCERIN (NITROSTAT) 0.4 MG SL tablet PLACE 1 TABLET UNDER TONGUE EVERY 5 MINUTES AS NEEDED FOR CHEST PAIN 25 tablet 3  . Omega-3 Fatty Acids (FISH OIL ADULT GUMMIES PO) Take 1 tablet by mouth daily.    . RABEprazole (ACIPHEX) 20 MG tablet TAKE 1 TABLET BY MOUTH EVERY DAY 90  tablet 0  . tamsulosin (FLOMAX) 0.4 MG CAPS capsule  TAKE 1 CAPSULE BY MOUTH EVERY DAY 90 capsule 0  . tiZANidine (ZANAFLEX) 2 MG tablet TAKE ONE TABLET BY MOUTH EVERY 8 HOURS AS NEEDED (MUSCLE SPASMS) FOR UP TO 30 DAYS.    . Baclofen 5 MG TABS Take 5 mg by mouth 3 (three) times daily. As needed for back spasm caution for sedation 24 tablet 0   Facility-Administered Medications Prior to Visit  Medication Dose Route Frequency Provider Last Rate Last Admin  . 0.9 %  sodium chloride infusion  500 mL Intravenous Continuous Milus Banister, MD         EXAM:  BP 140/86   Pulse 72   Temp 97.7 F (36.5 C) (Oral)   Ht 5' 7.5" (1.715 m)   Wt 203 lb (92.1 kg)   SpO2 96%   BMI 31.33 kg/m   Body mass index is 31.33 kg/m.  GENERAL: vitals reviewed and listed above, alert, oriented, appears well hydrated and in no acute distress  Uncomfortable  Ambulatory  Favors  Right leg   HEENT: atraumatic, conjunctiva  clear, no obvious abnormalities on inspection of external nose and ears OP : masked NECK: no obvious masses on inspection palpation  LUNGS: nl respirations   CV: HRRR, no clubbing cyanosis or  peripheral edema nl cap refill  MS: moves all extremities   Back location pain right paraspinal low    Dec right foot extensors ( reported  as old )  Other  non focal bending over gait PSYCH: pleasant and cooperative, no obvious depression or anxiety  BP Readings from Last 3 Encounters:  08/23/20 140/86  07/14/20 110/70  05/06/20 129/82    ASSESSMENT AND PLAN:  Discussed the following assessment and plan:  Back pain with right-sided sciatica - new pain onset worse with supine, mild right extenor foot weakness ( old)  mechanical pain advise fu w back surgical team   Back pain with history of spinal surgery - June  2021  New  Back pain   S/p surgery   June 2021  Bilateral l2-3 3-4 43 laminectomy  For cauda equina sx  Baclofen rx but   Advise  Assessment by dr brown team  As this appears to be mechanical  And nerve related  consider gabapentin  if wants to try but doubt will be curative  -Patient advised to return or notify health care team  if  new concerns arise.  Patient Instructions  Will refill the baclofen for now But we need you to see   Dr Owens Shark team . Because this pain is   Ms back pain .  Will send then a message for  Getting in  To be seen by dr Owens Shark   Consider. gabapentin  Also  If needed let me know if you want to try.  In interim .             Standley Brooking. Ceazia Harb M.D.

## 2020-08-26 DIAGNOSIS — M5416 Radiculopathy, lumbar region: Secondary | ICD-10-CM | POA: Diagnosis not present

## 2020-08-26 DIAGNOSIS — M5136 Other intervertebral disc degeneration, lumbar region: Secondary | ICD-10-CM | POA: Diagnosis not present

## 2020-09-01 ENCOUNTER — Telehealth: Payer: Self-pay | Admitting: Internal Medicine

## 2020-09-01 NOTE — Progress Notes (Signed)
  Chronic Care Management   Note  09/01/2020 Name: OLAWALE MARNEY MRN: [id number] DOB: [birth year]  LYDON VANSICKLE is a 79 y.o. year old male who is a primary care patient of Panosh, Standley Brooking, MD. I reached out to [name] by phone today in response to a referral sent by Mr. ADIEL ERNEY PCP, Panosh, Standley Brooking, MD.   Mr. Diebold was given information about Chronic Care Management services today including:  1. CCM service includes personalized support from designated clinical staff supervised by his physician, including individualized plan of care and coordination with other care providers 2. 24/7 contact phone numbers for assistance for urgent and routine care needs. 3. Service will only be billed when office clinical staff spend 20 minutes or more in a month to coordinate care. 4. Only one practitioner may furnish and bill the service in a calendar month. 5. The patient may stop CCM services at any time (effective at the end of the month) by phone call to the office staff.   Patient agreed to services and verbal consent obtained.   Follow up plan:   Carley Perdue [employer] Scheduler

## 2020-09-04 ENCOUNTER — Other Ambulatory Visit: Payer: Self-pay | Admitting: Internal Medicine

## 2020-09-06 NOTE — Progress Notes (Signed)
CARDIOLOGY OFFICE NOTE  Date:  09/14/2020    [name] Date of Birth: July 10, 1941 Medical Record #[id number]  PCP:  [name], MD  Cardiologist:  Gillian Shields  Chief Complaint  Patient presents with  . Follow-up    Seen for Dr. Johnsie Cancel    History of Present Illness: [name] is a 79 y.o. male who presents today for a 2 month check. Seen for Dr. Johnsie Cancel.Has primarily followed with me.   He has a history of known CAD with priorCABG in 2009. Other issues include HLD, BPH and HTN. He had repeat cath back in 2013 with patent grafts noted. Lesion in a small unbypassed OM to be managed medically.He has had muscle aches/pains/weakness with statin therapy. He has been on altered doses of Lipitor in the past with poor tolerance reported.  I have followed him over the past several years. He has had some increase in palpitations with cautious increase in his beta blocker given his 1st degree AV block. He has had chronic back pain. He is not active. More limited by his back. Has not tolerated statins or Zetia in the past. Balance has gotten poor.   When seen by me in January - trying to get vaccinated. He underwent back surgery at [facility] in June - with Dr. Owens Shark at [facility] - needed some medicine adjustments after he got back home due to concerns for BP/HR. Was on lower dose of ARB. Last seen by me in July - seemed to be doing ok. Getting stronger. Less pain. Gets way too much salt - they eat lots of takeout/delivery due to necessity.   Comes in today. Here alone. He has had recent ER visit for constipation. This is better. Seems to be doing ok. Rare twinge of chest pain - he is not sure what that is - it is very fleeting - not exertional. Breathing is ok - better without a mask. He is not really active as a general rule. Not getting any PT. His back surgery has been limited by some nerve pain and weak legs. No falls noted. Balance is poor. He is not really using  the muscle relaxers. He is taking some vitamin K apparently to try and help his arthritis. BP has been ok off ARB. Swelling seems to have improved. Still with probably too much salt. Overall, he feels like he is ok and holding his own.   Past Medical History:  Diagnosis Date  . Abdominal pain, right lateral 02/21/2012   progressive.    . ALLERGIC RHINITIS 07/23/2007  . Allergy   . Arthritis   . Atrial fibrillation ([place]) 07/06/2009  . Atypical mole 09/13/1992   left shoulder Dr Cheryln Manly exc  . Atypical nevi 10/08/1992   right abdomen Dr Francisca December  . BACK PAIN 11/02/2009  . BACK PAIN, LUMBAR 10/25/2010  . Basal cell carcinoma 08/06/1992   right post thigh Dr Cheryln Manly  . BCC (basal cell carcinoma of skin) 04/23/2018   left post shoulder tx with bx  . BENIGN PROSTATIC HYPERTROPHY 07/25/2007  . 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 CAD 01/22/2009  . Cancer ([place])    skin  . 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  . Cataract   . COLONIC POLYPS, HX OF 10/18/2007  . Cough 10/25/2010  . DIZZINESS 01/22/2009  . DYSPNEA ON EXERTION 08/21/2008  . ECHOCARDIOGRAM, ABNORMAL 08/19/2007  . GERD 07/25/2007  . Heart murmur   . HERNIA 07/06/2009  . HX, PERSONAL, MALIGNANCY, SKIN NEC  10/18/2007  . HYPERGLYCEMIA, BORDERLINE 12/31/2007  . HYPERLIPIDEMIA 07/25/2007  . HYPERTENSION 07/23/2007  . Inguinal hernia   . LUMBAR RADICULOPATHY, RIGHT 10/18/2007  . MUSCLE PAIN 01/22/2009  . Nausea   . NEURITIS 10/25/2010  . Palpitations 05/09/2010  . RHINITIS, CHRONIC 05/11/2010  . RUQ PAIN 01/22/2009  . Sinus polyp   . UNS ADVRS EFF OTH RX MEDICINAL&BIOLOGICAL SBSTNC 04/15/2008  . 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 VITAMIN D DEFICIENCY 11/02/2009  . Voice strain     Past Surgical History:  Procedure Laterality Date  . BACK SURGERY    . basal cell and melanoma removed     face  . BONY PELVIS SURGERY    . CATARACT EXTRACTION     BIL  . COLONOSCOPY    . CORONARY ARTERY BYPASS GRAFT  2009  . HERNIA REPAIR  7 times    inguinal  . LAPAROSCOPIC CHOLECYSTECTOMY  03/03/2013  . LUMBAR SPINE SURGERY    . POLYPECTOMY    . TONSILLECTOMY       Medications: Current Meds  Medication Sig  . acetaminophen (TYLENOL) 500 MG tablet Take 500 mg by mouth in the morning and at bedtime.  Marland Kitchen aspirin 81 MG tablet Take 81 mg by mouth daily.    . Baclofen 5 MG TABS Take 5 mg by mouth 3 (three) times daily. As needed for back spasm caution for sedation  . Cholecalciferol (VITAMIN D3) 125 MCG (5000 UT) CAPS Take 1 capsule (5,000 Units total) by mouth daily.  . clobetasol (OLUX) 0.05 % topical foam APPLY to back and leg  . Coenzyme Q10 100 MG TABS Take 1 tablet by mouth daily.  . DHA-EPA-Vitamin E (OMEGA-3 COMPLEX PO) Take 1,200 mg by mouth 3 (three) times daily. ProOmega LDL   . magnesium oxide (MAG-OX) 400 MG tablet Take 400 mg daily by mouth.  . metoprolol tartrate (LOPRESSOR) 50 MG tablet TAKE 1/2 TABLET BY MOUTH IN THE MORNING AND 1 TABLET BY MOUTH IN THE EVENIG  . metroNIDAZOLE (METROCREAM) 0.75 % cream Apply to face  . MULTIPLE VITAMIN PO Take 1 tablet by mouth daily.   Marland Kitchen MYRBETRIQ 25 MG TB24 tablet Take 25 mg by mouth daily.  . nitroGLYCERIN (NITROSTAT) 0.4 MG SL tablet PLACE 1 TABLET UNDER TONGUE EVERY 5 MINUTES AS NEEDED FOR CHEST PAIN  . Omega-3 Fatty Acids (FISH OIL ADULT GUMMIES PO) Take 1 tablet by mouth daily.  . RABEprazole (ACIPHEX) 20 MG tablet TAKE 1 TABLET BY MOUTH EVERY DAY  . tamsulosin (FLOMAX) 0.4 MG CAPS capsule TAKE 1 CAPSULE BY MOUTH EVERY DAY  . tiZANidine (ZANAFLEX) 2 MG tablet TAKE ONE TABLET BY MOUTH EVERY 8 HOURS AS NEEDED (MUSCLE SPASMS) FOR UP TO 30 DAYS.  . [DISCONTINUED] vitamin k 100 MCG tablet Take 100 mcg by mouth daily.   Current Facility-Administered Medications for the 09/14/20 encounter (Office Visit) with Burtis Junes, NP  Medication  . 0.9 %  sodium chloride infusion     Allergies: Allergies  Allergen Reactions  . Propoxyphene N-Acetaminophen Nausea And Vomiting  .  Iodinated Diagnostic Agents Swelling    Pt developed slight lt upper lip swelling only to one side about 15 minutes post IV contrast injection. No other symptoms.     Social History: The patient  reports that he quit smoking about 56 years ago. His smoking use included cigarettes. He has never used smokeless tobacco. He reports current alcohol use. He reports that he does not use drugs.   Family History: The  patient's family history includes Alcohol abuse in his brother, brother, brother, and father; Breast cancer in his sister; Cancer in his mother and sister; Heart attack in his father; Heart disease in his father; Hypertension in his brother, brother, brother, and father; Kidney disease in his sister; Lung cancer in his mother.   Review of Systems: Please see the history of present illness.   All other systems are reviewed and negative.   Physical Exam: VS:  BP 130/78   Pulse 79   Ht 5' 7.5" (1.715 m)   Wt 201 lb (91.2 kg)   SpO2 94%   BMI 31.02 kg/m  .  BMI Body mass index is 31.02 kg/m.  Wt Readings from Last 3 Encounters:  09/14/20 201 lb (91.2 kg)  09/11/20 200 lb (90.7 kg)  08/23/20 203 lb (92.1 kg)    General: Pleasant. Alert. Elderly. He is in no acute distress.   Cardiac: Regular rate and rhythm. No murmurs, rubs, or gallops. No edema.  Respiratory:  Lungs are clear to auscultation bilaterally with normal work of breathing.  GI: Soft and nontender.  MS: No deformity or atrophy. Gait and ROM intact. Using a care.  Skin: Warm and dry. Color is normal.  Neuro:  Strength and sensation are intact and no gross focal deficits noted.  Psych: Alert, appropriate and with normal affect.   LABORATORY DATA:  EKG:  EKG is not ordered today.    Lab Results  Component Value Date   WBC 7.5 07/14/2020   HGB 15.1 07/14/2020   HCT 43.6 07/14/2020   PLT 271 07/14/2020   GLUCOSE 163 (H) 07/14/2020   CHOL 180 10/13/2019   TRIG 126 10/13/2019   HDL 38 (L) 10/13/2019    LDLDIRECT 187.2 03/31/2008   LDLCALC 119 (H) 10/13/2019   ALT 19 10/13/2019   AST 13 10/13/2019   NA 141 07/14/2020   K 4.2 07/14/2020   CL 104 07/14/2020   CREATININE 0.73 (L) 07/14/2020   BUN 15 07/14/2020   CO2 22 07/14/2020   TSH 1.640 10/13/2019   PSA 1.49 02/21/2019   INR 1.1 (H) 01/25/2012   HGBA1C 5.8 02/20/2017     BNP (last 3 results) No results for input(s): BNP in the last 8760 hours.  ProBNP (last 3 results) Recent Labs    10/13/19 1523  PROBNP 110     Other Studies Reviewed Today:  ECHOIMPRESSIONS10/2020  1. Left ventricular ejection fraction, by visual estimation, is 65 to 70%. The left ventricle has normal function. Normal left ventricular size. There is mildly increased left ventricular hypertrophy. 2. Left ventricular diastolic Doppler parameters are consistent with impaired relaxation pattern of LV diastolic filling. 3. Global right ventricle has normal systolic function.The right ventricular size is mildly enlarged. No increase in right ventricular wall thickness. 4. Left atrial size was mildly dilated. 5. Right atrial size was normal. 6. Mild aortic valve annular calcification. 7. The mitral valve is grossly normal. Trace mitral valve regurgitation. No evidence of mitral stenosis. 8. The tricuspid valve is normal in structure. Tricuspid valve regurgitation was not visualized by color flow Doppler. 9. The aortic valve is normal in structure. Aortic valve regurgitation was not visualized by color flow Doppler. Mild aortic valve sclerosis without stenosis. 10. There is Mild calcification of the aortic valve. 11. There is Mild thickening of the aortic valve. 12. The pulmonic valve was normal in structure. Pulmonic valve regurgitation is trivial by color flow Doppler. 13. The inferior vena cava is normal in size  with greater than 50% respiratory variability, suggesting right atrial pressure of 3 mmHg.  In comparison to the previous  echocardiogram(s): 11/13/17 EF 60-65%.    HolterStudy Highlights11/2018  NSR PAC;s / PVC;s No significant arrhythmias PVC;s account for less than 1% total beats    EchoStudy Conclusions11/2018  - Left ventricle: The cavity size was normal. There was moderate concentric hypertrophy. Systolic function was normal. The estimated ejection fraction was in the range of 60% to 65%. Wall motion was normal; there were no regional wall motion abnormalities. Doppler parameters are consistent with abnormal left ventricular relaxation (grade 1 diastolic dysfunction). Doppler parameters are consistent with elevated ventricular end-diastolic filling pressure. - Aortic valve: Trileaflet; normal thickness leaflets. There was no regurgitation. - Aortic root: The aortic root was normal in size. - Mitral valve: There was mild regurgitation. - Left atrium: The atrium was moderately dilated. - Right ventricle: Systolic function was normal. - Right atrium: The atrium was normal in size. - Tricuspid valve: There was trivial regurgitation. - Pulmonary arteries: Systolic pressure was within the normal range. - Inferior vena cava: The vessel was normal in size. The respirophasic diameter changes were in the normal range (= 50%), consistent with normal central venous pressure. - Pericardium, extracardiac: There was no pericardial effusion.    ASSESSMENT & PLAN:   1. CAD - remote CABG - he has no exertional symptoms - would favor conservative management.   2. HTN - BP looks fine - will stay on current regimen - remains off his ARB.   3. HLD - does not tolerate therapy and does not wish to try anything else.   4. Spinal stenosis - prior back surgery - not sure how much this has really helped him.   5. 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 Would follow.   6. Aortic sclerosis   Current medicines are reviewed with the patient today.  The patient does  not have concerns regarding medicines other than what has been noted above.  The following changes have been made:  See above.  Labs/ tests ordered today include:   No orders of the defined types were placed in this encounter.    Disposition:   FU with [place] in about 4 months. Overall, he seems to be holding his own.    Patient is agreeable to this plan and will call if any problems develop in the interim.   SignedTruitt Merle, NP  09/14/2020 2:33 PM  [facility] [address] [place], [place]  [address] Phone: [phone] Fax: [phone]

## 2020-09-11 ENCOUNTER — Encounter (HOSPITAL_COMMUNITY): Payer: Self-pay | Admitting: Emergency Medicine

## 2020-09-11 ENCOUNTER — Emergency Department (HOSPITAL_COMMUNITY)
Admission: EM | Admit: 2020-09-11 | Discharge: 2020-09-11 | Disposition: A | Discharge status: Home / Self Care | Payer: Medicare Other | Attending: Emergency Medicine | Admitting: Emergency Medicine

## 2020-09-11 ENCOUNTER — Other Ambulatory Visit: Payer: Self-pay

## 2020-09-11 DIAGNOSIS — Z7982 Long term (current) use of aspirin: Secondary | ICD-10-CM | POA: Insufficient documentation

## 2020-09-11 DIAGNOSIS — R109 Unspecified abdominal pain: Secondary | ICD-10-CM

## 2020-09-11 DIAGNOSIS — I25810 Atherosclerosis of coronary artery bypass graft(s) without angina pectoris: Secondary | ICD-10-CM | POA: Insufficient documentation

## 2020-09-11 DIAGNOSIS — Z743 Need for continuous supervision: Secondary | ICD-10-CM | POA: Diagnosis not present

## 2020-09-11 DIAGNOSIS — I119 Hypertensive heart disease without heart failure: Secondary | ICD-10-CM | POA: Diagnosis not present

## 2020-09-11 DIAGNOSIS — K5900 Constipation, unspecified: Secondary | ICD-10-CM | POA: Insufficient documentation

## 2020-09-11 DIAGNOSIS — Z87891 Personal history of nicotine dependence: Secondary | ICD-10-CM | POA: Insufficient documentation

## 2020-09-11 DIAGNOSIS — Z79899 Other long term (current) drug therapy: Secondary | ICD-10-CM | POA: Insufficient documentation

## 2020-09-11 NOTE — ED Triage Notes (Signed)
Patient states he is having abd pain. Patient states he has not been able to have a bowel movement and has been trying to all day without success.

## 2020-09-11 NOTE — ED Provider Notes (Signed)
[facility] DEPT Provider Note   CSN: [id number] Arrival date & time: 09/11/20  2003     History Chief Complaint  Patient presents with  . Constipation  . Abdominal Pain    [name] is a 79 y.o. male.   Constipation Severity:  Severe Time since last bowel movement:  12 hours Timing:  Constant Progression:  Worsening Chronicity:  New Stool description:  Formed and hard Relieved by:  Defecation Worsened by:  Nothing Associated symptoms: abdominal pain   Associated symptoms: no back pain, no diarrhea, no dysuria, no fever, no nausea and no vomiting   Abdominal Pain Associated symptoms: constipation   Associated symptoms: no chest pain, no chills, no cough, no diarrhea, no dysuria, no fever, no nausea, no shortness of breath and no vomiting        Past Medical History:  Diagnosis Date  . Abdominal pain, right lateral 02/21/2012   progressive.    . ALLERGIC RHINITIS 07/23/2007  . Allergy   . Arthritis   . Atrial fibrillation ([place]) 07/06/2009  . Atypical mole 09/13/1992   left shoulder Dr Cheryln Manly exc  . Atypical nevi 10/08/1992   right abdomen Dr Francisca December  . BACK PAIN 11/02/2009  . BACK PAIN, LUMBAR 10/25/2010  . Basal cell carcinoma 08/06/1992   right post thigh Dr Cheryln Manly  . BCC (basal cell carcinoma of skin) 04/23/2018   left post shoulder tx with bx  . BENIGN PROSTATIC HYPERTROPHY 07/25/2007  . 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 CAD 01/22/2009  . Cancer ([place])    skin  . 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  . Cataract   . COLONIC POLYPS, HX OF 10/18/2007  . Cough 10/25/2010  . DIZZINESS 01/22/2009  . DYSPNEA ON EXERTION 08/21/2008  . ECHOCARDIOGRAM, ABNORMAL 08/19/2007  . GERD 07/25/2007  . Heart murmur   . HERNIA 07/06/2009  . HX, PERSONAL, MALIGNANCY, SKIN NEC 10/18/2007  . HYPERGLYCEMIA, BORDERLINE 12/31/2007  . HYPERLIPIDEMIA 07/25/2007  . HYPERTENSION 07/23/2007  . Inguinal hernia   . LUMBAR RADICULOPATHY, RIGHT  10/18/2007  . MUSCLE PAIN 01/22/2009  . Nausea   . NEURITIS 10/25/2010  . Palpitations 05/09/2010  . RHINITIS, CHRONIC 05/11/2010  . RUQ PAIN 01/22/2009  . Sinus polyp   . UNS ADVRS EFF OTH RX MEDICINAL&BIOLOGICAL SBSTNC 04/15/2008  . 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 VITAMIN D DEFICIENCY 11/02/2009  . Voice strain     Patient Active Problem List   Diagnosis Date Noted  . 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 Allergic conjunctivitis of both eyes 04/22/2018  . Neck pain on right side 11/03/2014  . Urinary frequency 07/27/2014  . Medicare annual wellness visit, initial 07/27/2014  . Rash and nonspecific skin eruption 07/27/2014  . Nodule, subcutaneous 07/27/2014  . Atherosclerosis of aorta ([place]) 10/25/2013  . Hyperglycemia 03/07/2013  . Status post laparoscopic cholecystectomy 03/07/2013  . 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  . Bilateral renal cysts 02/28/2012  . Hepatic cyst 02/28/2012  . Atherosclerosis 02/28/2012  . Contact dermatitis 02/28/2012  . Dermatitis 04/03/2011  . BACK PAIN, LUMBAR 10/25/2010  . NEURITIS 10/25/2010  . RHINITIS, CHRONIC 05/11/2010  . PALPITATIONS 05/09/2010  . 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  . VITAMIN D DEFICIENCY 11/02/2009  . BACK PAIN 11/02/2009  . BLADDER NECK OBSTRUCTION 07/07/2009  . ATRIAL FIBRILLATION 07/06/2009  . CAD 01/22/2009  . MUSCLE PAIN 01/22/2009  . DYSPNEA ON EXERTION 08/21/2008  . 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 HYPERGLYCEMIA,  BORDERLINE 12/31/2007  . LUMBAR RADICULOPATHY, RIGHT 10/18/2007  . HX, PERSONAL, MALIGNANCY, SKIN NEC 10/18/2007  . COLONIC POLYPS, HX OF 10/18/2007  . ECHOCARDIOGRAM, ABNORMAL 08/19/2007  . HYPERLIPIDEMIA 07/25/2007  . GERD 07/25/2007  . BENIGN PROSTATIC HYPERTROPHY 07/25/2007  . HYPERTENSION 07/23/2007  . ALLERGIC RHINITIS 07/23/2007    Past Surgical History:  Procedure Laterality Date  . BACK SURGERY    . basal cell and melanoma  removed     face  . BONY PELVIS SURGERY    . CATARACT EXTRACTION     BIL  . COLONOSCOPY    . CORONARY ARTERY BYPASS GRAFT  2009  . HERNIA REPAIR  7 times   inguinal  . LAPAROSCOPIC CHOLECYSTECTOMY  03/03/2013  . LUMBAR SPINE SURGERY    . POLYPECTOMY    . TONSILLECTOMY         Family History  Problem Relation Age of Onset  . Lung cancer Mother   . Cancer Mother   . Heart attack Father   . Hypertension Father   . Alcohol abuse Father   . Heart disease Father   . Breast cancer Sister   . Cancer Sister        breast  . Alcohol abuse Brother   . Hypertension Brother   . Alcohol abuse Brother   . Hypertension Brother   . Alcohol abuse Brother   . Hypertension Brother   . Kidney disease Sister   . Colon cancer Neg Hx   . Esophageal cancer Neg Hx   . Rectal cancer Neg Hx   . Stomach cancer Neg Hx     Social History   Tobacco Use  . Smoking status: Former Smoker    Types: Cigarettes    Quit date: 04/02/1964    Years since quitting: 56.4  . Smokeless tobacco: Never Used  . Tobacco comment: in early 20's he quit  Vaping Use  . Vaping Use: Never used  Substance Use Topics  . Alcohol use: Yes    Comment: socially  . Drug use: No    Home Medications Prior to Admission medications   Medication Sig Start Date End Date Taking? Authorizing Provider  acetaminophen (TYLENOL) 500 MG tablet Take 500 mg by mouth in the morning and at bedtime.    [provider]  aspirin 81 MG tablet Take 81 mg by mouth daily.      [provider]  Baclofen 5 MG TABS Take 5 mg by mouth 3 (three) times daily. As needed for back spasm caution for sedation 08/23/20   Panosh, Standley Brooking, MD  Cholecalciferol (VITAMIN D3) 125 MCG (5000 UT) CAPS Take 1 capsule (5,000 Units total) by mouth daily. 10/17/19   Burtis Junes, NP  clobetasol (OLUX) 0.05 % topical foam APPLY to back and leg 08/06/20   Robyne Askew R, PA-C  Coenzyme Q10 100 MG TABS Take 1 tablet by mouth daily.     [provider]  DHA-EPA-Vitamin E (OMEGA-3 COMPLEX PO) Take 1,200 mg by mouth 3 (three) times daily. ProOmega LDL     [provider]  magnesium oxide (MAG-OX) 400 MG tablet Take 400 mg daily by mouth.    [provider]  metoprolol tartrate (LOPRESSOR) 50 MG tablet TAKE 1/2 TABLET BY MOUTH IN THE MORNING AND 1 TABLET BY MOUTH IN THE EVENIG 01/20/20   Burtis Junes, NP  metroNIDAZOLE (METROCREAM) 0.75 % cream Apply to face 08/06/20   Warren Danes, PA-C  MULTIPLE VITAMIN PO Take 1  tablet by mouth daily.     [provider]  MYRBETRIQ 25 MG TB24 tablet Take 25 mg by mouth daily. 06/10/16   [provider]  nitroGLYCERIN (NITROSTAT) 0.4 MG SL tablet PLACE 1 TABLET UNDER TONGUE EVERY 5 MINUTES AS NEEDED FOR CHEST PAIN 03/08/20   Burtis Junes, NP  Omega-3 Fatty Acids (FISH OIL ADULT GUMMIES PO) Take 1 tablet by mouth daily.    [provider]  RABEprazole (ACIPHEX) 20 MG tablet TAKE 1 TABLET BY MOUTH EVERY DAY 09/07/20   Panosh, Standley Brooking, MD  tamsulosin (FLOMAX) 0.4 MG CAPS capsule TAKE 1 CAPSULE BY MOUTH EVERY DAY 04/19/20   Panosh, Standley Brooking, MD  tiZANidine (ZANAFLEX) 2 MG tablet TAKE ONE TABLET BY MOUTH EVERY 8 HOURS AS NEEDED (MUSCLE SPASMS) FOR UP TO 30 DAYS. 08/17/20   [provider]    Allergies    Propoxyphene n-acetaminophen and Iodinated diagnostic agents  Review of Systems   Review of Systems  Constitutional: Negative for chills and fever.  HENT: Negative for congestion and rhinorrhea.   Respiratory: Negative for cough and shortness of breath.   Cardiovascular: Negative for chest pain and palpitations.  Gastrointestinal: Positive for abdominal pain and constipation. Negative for diarrhea, nausea and vomiting.  Genitourinary: Negative for difficulty urinating and dysuria.  Musculoskeletal: Negative for arthralgias and back pain.  Skin: Negative for color change and rash.  Neurological: Negative for light-headedness  and headaches.    Physical Exam Updated Vital Signs BP (!) 171/91 (BP Location: Left Arm)   Pulse 88   Temp 97.8 F (36.6 C) (Oral)   Resp 18   Ht 5\' 8"  (1.727 m)   Wt 90.7 kg   SpO2 93%   BMI 30.41 kg/m   Physical Exam Vitals and nursing note reviewed.  Constitutional:      General: He is not in acute distress.    Appearance: Normal appearance.  HENT:     Head: Normocephalic and atraumatic.     Nose: No rhinorrhea.  Eyes:     General:        Right eye: No discharge.        Left eye: No discharge.     Conjunctiva/sclera: Conjunctivae normal.  Cardiovascular:     Rate and Rhythm: Normal rate and regular rhythm.  Pulmonary:     Effort: Pulmonary effort is normal.     Breath sounds: No stridor.  Abdominal:     General: Abdomen is flat. There is no distension.     Palpations: Abdomen is soft.     Tenderness: There is no abdominal tenderness. There is no guarding or rebound.  Musculoskeletal:        General: No deformity or signs of injury.  Skin:    General: Skin is warm and dry.  Neurological:     General: No focal deficit present.     Mental Status: He is alert. Mental status is at baseline.     Motor: No weakness.  Psychiatric:        Mood and Affect: Mood normal.        Behavior: Behavior normal.        Thought Content: Thought content normal.     ED Results / Procedures / Treatments   Labs (all labs ordered are listed, but only abnormal results are displayed) Labs Reviewed - No data to display  EKG None  Radiology No results found.  Procedures Procedures (including critical care time)  Medications Ordered in ED Medications -  No data to display  ED Course  I have reviewed the triage vital signs and the nursing notes.  Pertinent labs & imaging results that were available during my care of the patient were reviewed by me and considered in my medical decision making (see chart for details).    MDM Rules/Calculators/A&P                            79 year old male with chronic constipation comes in with inability passing stool for 12 hours.  He said it was painful.  He is waiting in the waiting room to be seen.  When he had a very large bowel movement had complete resolution of symptoms.  He is requesting discharge.  I examined his abdomen it was soft nontender nondistended.  He is well-appearing.  I offered him further evaluation to include possible lab studies and imaging he declines and wishes to be discharged.  I feel that he is safe for discharge home, strict return precautions given.  It is likely that is on differential abdominal pain secondary to constipation Final Clinical Impression(s) / ED Diagnoses Final diagnoses:  Undifferentiated abdominal pain  Constipation, unspecified constipation type    Rx / DC Orders ED Discharge Orders    None       Breck Coons, MD 09/11/20 2139

## 2020-09-11 NOTE — ED Notes (Signed)
Pt went to restroom. Had a BM and states he feels much better now.

## 2020-09-11 NOTE — ED Notes (Signed)
Patient states he took a laxative and miralax today around 1400. Patient noted to have a large bowel movement and feels better

## 2020-09-14 ENCOUNTER — Ambulatory Visit (INDEPENDENT_AMBULATORY_CARE_PROVIDER_SITE_OTHER): Payer: Medicare Other | Admitting: Nurse Practitioner

## 2020-09-14 ENCOUNTER — Encounter: Payer: Self-pay | Admitting: Nurse Practitioner

## 2020-09-14 ENCOUNTER — Other Ambulatory Visit: Payer: Self-pay

## 2020-09-14 VITALS — BP 130/78 | HR 79 | Ht 67.5 in | Wt 201.0 lb

## 2020-09-14 DIAGNOSIS — R011 Cardiac murmur, unspecified: Secondary | ICD-10-CM

## 2020-09-14 DIAGNOSIS — I10 Essential (primary) hypertension: Secondary | ICD-10-CM | POA: Diagnosis not present

## 2020-09-14 DIAGNOSIS — I259 Chronic ischemic heart disease, unspecified: Secondary | ICD-10-CM

## 2020-09-14 DIAGNOSIS — E7849 Other hyperlipidemia: Secondary | ICD-10-CM | POA: Diagnosis not present

## 2020-09-14 NOTE — Patient Instructions (Addendum)
After Visit Summary:  We will be checking the following labs today - NONE   Medication Instructions:    Continue with your current medicines.   I do not think you need a supplement for memory  I do not think you need vitamin K  A plain multi vitamin each day is fine  You can use Voltaren gel over the counter for pain - or products like Bengay.    If you need a refill on your cardiac medications before your next appointment, please call your pharmacy.     Testing/Procedures To Be Arranged:  N/A  Follow-Up:   See me in 4 months.     At [facility], you and your health needs are our priority.  As part of our continuing mission to provide you with exceptional heart care, we have created designated Provider Care Teams.  These Care Teams include your primary Cardiologist (physician) and Advanced Practice Providers (APPs -  Physician Assistants and Nurse Practitioners) who all work together to provide you with the care you need, when you need it.  Special Instructions:  . Stay safe, wash your hands for at least 20 seconds and wear a mask when needed.  . It was good to talk with you today.    Call the [place] office at [phone] if you have any questions, problems or concerns.

## 2020-09-15 NOTE — Progress Notes (Signed)
Virtual Visit via Video Note  I connected with@ on 9 23 21  at 10:00 AM EDT by a video enabled telemedicine application and verified that I am speaking with the correct person using two identifiers. Location patient: home Location provider:  office Persons participating in the virtual visit: patient, provider spouse   [facility] [place] national recommendations  regarding COVID 19 pandemic   video visit is advised over in office visit for this patient.  Patient aware  of the limitations of evaluation and management by telemedicine and  availability of in person appointments. and agreed to proceed.   HPI: [name] presents for video visit  Fu ed visit for  Lower abd pain and constipation  Impaction  See ed visit   after went felt better   Nl pattern daily  Felt to have dehydration as a poss cause. Since then has been takin in 33 water and liquids per day and mira lax qd and now stools soft and going ok     Has radicular pain rx with steroid and doing ok off and on takes tylenol and no narcotics   BG has been normal  Range   ROS: See pertinent positives and negatives per HPI.  Past Medical History:  Diagnosis Date  . Abdominal pain, right lateral 02/21/2012   progressive.    . ALLERGIC RHINITIS 07/23/2007  . Allergy   . Arthritis   . Atrial fibrillation ([place]) 07/06/2009  . Atypical mole 09/13/1992   left shoulder Dr Cheryln Manly exc  . Atypical nevi 10/08/1992   right abdomen Dr Francisca December  . BACK PAIN 11/02/2009  . BACK PAIN, LUMBAR 10/25/2010  . Basal cell carcinoma 08/06/1992   right post thigh Dr Cheryln Manly  . BCC (basal cell carcinoma of skin) 04/23/2018   left post shoulder tx with bx  . BENIGN PROSTATIC HYPERTROPHY 07/25/2007  . 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 CAD 01/22/2009  . Cancer ([place])    skin  . 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  . Cataract   . COLONIC POLYPS, HX OF 10/18/2007  . Cough 10/25/2010  . DIZZINESS 01/22/2009  . DYSPNEA ON EXERTION 08/21/2008  .  ECHOCARDIOGRAM, ABNORMAL 08/19/2007  . GERD 07/25/2007  . Heart murmur   . HERNIA 07/06/2009  . HX, PERSONAL, MALIGNANCY, SKIN NEC 10/18/2007  . HYPERGLYCEMIA, BORDERLINE 12/31/2007  . HYPERLIPIDEMIA 07/25/2007  . HYPERTENSION 07/23/2007  . Inguinal hernia   . LUMBAR RADICULOPATHY, RIGHT 10/18/2007  . MUSCLE PAIN 01/22/2009  . Nausea   . NEURITIS 10/25/2010  . Palpitations 05/09/2010  . RHINITIS, CHRONIC 05/11/2010  . RUQ PAIN 01/22/2009  . Sinus polyp   . UNS ADVRS EFF OTH RX MEDICINAL&BIOLOGICAL SBSTNC 04/15/2008  . 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 VITAMIN D DEFICIENCY 11/02/2009  . Voice strain     Past Surgical History:  Procedure Laterality Date  . BACK SURGERY    . basal cell and melanoma removed     face  . BONY PELVIS SURGERY    . CATARACT EXTRACTION     BIL  . COLONOSCOPY    . CORONARY ARTERY BYPASS GRAFT  2009  . HERNIA REPAIR  7 times   inguinal  . LAPAROSCOPIC CHOLECYSTECTOMY  03/03/2013  . LUMBAR SPINE SURGERY    . POLYPECTOMY    . TONSILLECTOMY      Family History  Problem Relation Age of Onset  . Lung cancer Mother   . Cancer Mother   . Heart attack Father   . Hypertension Father   . Alcohol  abuse Father   . Heart disease Father   . Breast cancer Sister   . Cancer Sister        breast  . Alcohol abuse Brother   . Hypertension Brother   . Alcohol abuse Brother   . Hypertension Brother   . Alcohol abuse Brother   . Hypertension Brother   . Kidney disease Sister   . Colon cancer Neg Hx   . Esophageal cancer Neg Hx   . Rectal cancer Neg Hx   . Stomach cancer Neg Hx     Social History   Tobacco Use  . Smoking status: Former Smoker    Types: Cigarettes    Quit date: 04/02/1964    Years since quitting: 56.4  . Smokeless tobacco: Never Used  . Tobacco comment: in early 20's he quit  Vaping Use  . Vaping Use: Never used  Substance Use Topics  . Alcohol use: Yes    Comment: socially  . Drug use: No      Current Outpatient Medications:  .   acetaminophen (TYLENOL) 500 MG tablet, Take 500 mg by mouth in the morning and at bedtime., Disp: , Rfl:  .  aspirin 81 MG tablet, Take 81 mg by mouth daily.  , Disp: , Rfl:  .  Cholecalciferol (VITAMIN D3) 125 MCG (5000 UT) CAPS, Take 1 capsule (5,000 Units total) by mouth daily., Disp: 90 capsule, Rfl: 3 .  clobetasol (OLUX) 0.05 % topical foam, APPLY to back and leg, Disp: 50 g, Rfl: 10 .  Coenzyme Q10 100 MG TABS, Take 1 tablet by mouth daily., Disp: , Rfl:  .  DHA-EPA-Vitamin E (OMEGA-3 COMPLEX PO), Take 1,200 mg by mouth 3 (three) times daily. ProOmega LDL , Disp: , Rfl:  .  magnesium oxide (MAG-OX) 400 MG tablet, Take 400 mg daily by mouth., Disp: , Rfl:  .  metoprolol tartrate (LOPRESSOR) 50 MG tablet, TAKE 1/2 TABLET BY MOUTH IN THE MORNING AND 1 TABLET BY MOUTH IN THE EVENIG, Disp: 135 tablet, Rfl: 2 .  metroNIDAZOLE (METROCREAM) 0.75 % cream, Apply to face, Disp: 45 g, Rfl: 0 .  MULTIPLE VITAMIN PO, Take 1 tablet by mouth daily. , Disp: , Rfl:  .  MYRBETRIQ 25 MG TB24 tablet, Take 25 mg by mouth daily., Disp: , Rfl: 0 .  nitroGLYCERIN (NITROSTAT) 0.4 MG SL tablet, PLACE 1 TABLET UNDER TONGUE EVERY 5 MINUTES AS NEEDED FOR CHEST PAIN, Disp: 25 tablet, Rfl: 3 .  Omega-3 Fatty Acids (FISH OIL ADULT GUMMIES PO), Take 1 tablet by mouth daily., Disp: , Rfl:  .  RABEprazole (ACIPHEX) 20 MG tablet, TAKE 1 TABLET BY MOUTH EVERY DAY, Disp: 90 tablet, Rfl: 0 .  tamsulosin (FLOMAX) 0.4 MG CAPS capsule, TAKE 1 CAPSULE BY MOUTH EVERY DAY, Disp: 90 capsule, Rfl: 0 .  Baclofen 5 MG TABS, Take 5 mg by mouth 3 (three) times daily. As needed for back spasm caution for sedation (Patient not taking: Reported on 09/16/2020), Disp: 24 tablet, Rfl: 0 .  tiZANidine (ZANAFLEX) 2 MG tablet, TAKE ONE TABLET BY MOUTH EVERY 8 HOURS AS NEEDED (MUSCLE SPASMS) FOR UP TO 30 DAYS. (Patient not taking: Reported on 09/16/2020), Disp: , Rfl:   Current Facility-Administered Medications:  .  0.9 %  sodium chloride infusion, 500  mL, Intravenous, Continuous, Milus Banister, MD  EXAM: BP Readings from Last 3 Encounters:  09/14/20 130/78  09/11/20 (!) 171/91  08/23/20 140/86    VITALS per patient if applicable:  GENERAL:  alert, oriented, appears well and in no acute distress HEENT: atraumatic, conjunttiva clear, no obvious abnormalities on inspection of external nose and ear NECK: normal movements of the head and neck LUNGS: on inspection no signs of respiratory distress, breathing rate appears normal, no obvious gross SOB, gasping or wheezing CV: no obvious cyanosis PSYCH/NEURO: pleasant and cooperative, no obvious depression or anxiety, speech and thought processing grossly intact Lab Results  Component Value Date   WBC 7.5 07/14/2020   HGB 15.1 07/14/2020   HCT 43.6 07/14/2020   PLT 271 07/14/2020   GLUCOSE 163 (H) 07/14/2020   CHOL 180 10/13/2019   TRIG 126 10/13/2019   HDL 38 (L) 10/13/2019   LDLDIRECT 187.2 03/31/2008   LDLCALC 119 (H) 10/13/2019   ALT 19 10/13/2019   AST 13 10/13/2019   NA 141 07/14/2020   K 4.2 07/14/2020   CL 104 07/14/2020   CREATININE 0.73 (L) 07/14/2020   BUN 15 07/14/2020   CO2 22 07/14/2020   TSH 1.640 10/13/2019   PSA 1.49 02/21/2019   INR 1.1 (H) 01/25/2012   HGBA1C 5.8 02/20/2017    ASSESSMENT AND PLAN:  Discussed the following assessment and plan:    ICD-10-CM   1. Constipation, unspecified constipation type  K59.00   2. Back pain with history of spinal surgery  M54.9   3. Urine frequency  R35.0   4. Medication management  Z79.899    Improved with hydration and daily mira lax and no abd pain  No narcotics at present and under care of back     urinary frequency with hydration effecting sleep wakening ok to  Hydrate in day and dec vol;ume iat night   Last colon 2018   If bowel habit changes  Recurrent progressive  Can get back with Dr Ardis Hughs for evaluation or pcp team. [facility] for booster 8+ months out .  shingrix vaccine  At pharmacy   [facility].    Expectant management and discussion of plan and treatment with opportunity to ask questions and all were answered. The patient agreed with the plan and demonstrated an understanding of the instructions.   Advised to call back or seek an in-person evaluation if worsening  or having  further concerns . Return if symptoms worsen or fail to improve, for as indicated. 32 minutes  Review data anc evaluate and  Counsel.    Shanon Ace, MD

## 2020-09-16 ENCOUNTER — Telehealth (INDEPENDENT_AMBULATORY_CARE_PROVIDER_SITE_OTHER): Payer: Medicare Other | Admitting: Internal Medicine

## 2020-09-16 ENCOUNTER — Other Ambulatory Visit: Payer: Self-pay

## 2020-09-16 ENCOUNTER — Encounter: Payer: Self-pay | Admitting: Internal Medicine

## 2020-09-16 VITALS — Ht 67.5 in | Wt 201.0 lb

## 2020-09-16 DIAGNOSIS — R350 Frequency of micturition: Secondary | ICD-10-CM

## 2020-09-16 DIAGNOSIS — M549 Dorsalgia, unspecified: Secondary | ICD-10-CM

## 2020-09-16 DIAGNOSIS — Z9889 Other specified postprocedural states: Secondary | ICD-10-CM

## 2020-09-16 DIAGNOSIS — K5900 Constipation, unspecified: Secondary | ICD-10-CM

## 2020-09-16 DIAGNOSIS — Z79899 Other long term (current) drug therapy: Secondary | ICD-10-CM | POA: Diagnosis not present

## 2020-09-16 DIAGNOSIS — I259 Chronic ischemic heart disease, unspecified: Secondary | ICD-10-CM | POA: Diagnosis not present

## 2020-09-22 ENCOUNTER — Telehealth: Payer: Self-pay | Admitting: Internal Medicine

## 2020-09-22 NOTE — Telephone Encounter (Signed)
pts spouse is calling in stating that she thinks pt has parkinson (dragging leg, legs shake while he is lying down and not able to pivot his feet).  Spouse would like to have a call back to elaborate more on the diagnoses.

## 2020-09-22 NOTE — Telephone Encounter (Signed)
Called and spoke to wife and she stated that patient is having bowel and bladder changes, right leg is weak, dragging feet, muscles jump when he sleeps or rests and wife is worried but wants to wait until Dr. Regis Bill returns. Wife also stated that patient has a hard time trying to pivot to turn around.  Scheduled 4pm for Monday, 09/27/20. Sending as Juluis Rainier.

## 2020-09-27 ENCOUNTER — Encounter: Payer: Self-pay | Admitting: Internal Medicine

## 2020-09-27 ENCOUNTER — Ambulatory Visit (INDEPENDENT_AMBULATORY_CARE_PROVIDER_SITE_OTHER): Payer: Medicare Other | Admitting: Internal Medicine

## 2020-09-27 ENCOUNTER — Other Ambulatory Visit: Payer: Self-pay

## 2020-09-27 VITALS — BP 112/68 | HR 84 | Ht 67.5 in | Wt 202.0 lb

## 2020-09-27 DIAGNOSIS — Z9889 Other specified postprocedural states: Secondary | ICD-10-CM

## 2020-09-27 DIAGNOSIS — I259 Chronic ischemic heart disease, unspecified: Secondary | ICD-10-CM

## 2020-09-27 DIAGNOSIS — R29898 Other symptoms and signs involving the musculoskeletal system: Secondary | ICD-10-CM

## 2020-09-27 NOTE — Progress Notes (Signed)
Chief Complaint  Patient presents with  . Gait Problem    HPI: [name] 79 y.o. come in for  Above problem see messages .  He is here with wife After his initial back surgery he had significant improvement in pain and a minimal right lower extremity problem.  In the summer he had an acute onset severe stabbing pain in his back that may or may not of had radiation.  Upon evaluation it was felt that he had a pinched nerve was given a steroid taper which significantly improves the pain. However since that time in the last month he has been noticing increasing weakness or instability of a light right lower extremity feeling that his leg can give out cramping in his anterior thigh.  He has not fallen uses a cane to steady.  Has noticed he feels like he has to drag his right leg which makes it hard to turn and pivot. His wife stated that after surgery and rehab he was able to roll out of bed on himself however now it is difficult for him to move his leg to transition getting out of bed. Occasionally he gets jumpy legs more on the right when he lays down. His last check with Dr. Rosalene Billings in August was felt to be okay for follow-up on a as needed basis.  Has areflexia in his ankle Achilles on exam hard to get reflexes in the patellar.  Wife was worried if he could have a gait problem like Parkinson's because of the way has to turn.  ROS: See pertinent positives and negatives per HPI.  No fever or falling does have frequent urination as in past staying hydrated constipation is better when he takes MiraLAX every day stooling 2-3 times a day soft.  Past Medical History:  Diagnosis Date  . Abdominal pain, right lateral 02/21/2012   progressive.    . ALLERGIC RHINITIS 07/23/2007  . Allergy   . Arthritis   . Atrial fibrillation ([place]) 07/06/2009  . Atypical mole 09/13/1992   left shoulder Dr Cheryln Manly exc  . Atypical nevi 10/08/1992   right abdomen Dr Francisca December  . BACK PAIN 11/02/2009  .  BACK PAIN, LUMBAR 10/25/2010  . Basal cell carcinoma 08/06/1992   right post thigh Dr Cheryln Manly  . BCC (basal cell carcinoma of skin) 04/23/2018   left post shoulder tx with bx  . BENIGN PROSTATIC HYPERTROPHY 07/25/2007  . 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 CAD 01/22/2009  . Cancer ([place])    skin  . 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  . Cataract   . COLONIC POLYPS, HX OF 10/18/2007  . Cough 10/25/2010  . DIZZINESS 01/22/2009  . DYSPNEA ON EXERTION 08/21/2008  . ECHOCARDIOGRAM, ABNORMAL 08/19/2007  . GERD 07/25/2007  . Heart murmur   . HERNIA 07/06/2009  . HX, PERSONAL, MALIGNANCY, SKIN NEC 10/18/2007  . HYPERGLYCEMIA, BORDERLINE 12/31/2007  . HYPERLIPIDEMIA 07/25/2007  . HYPERTENSION 07/23/2007  . Inguinal hernia   . LUMBAR RADICULOPATHY, RIGHT 10/18/2007  . MUSCLE PAIN 01/22/2009  . Nausea   . NEURITIS 10/25/2010  . Palpitations 05/09/2010  . RHINITIS, CHRONIC 05/11/2010  . RUQ PAIN 01/22/2009  . Sinus polyp   . UNS ADVRS EFF OTH RX MEDICINAL&BIOLOGICAL SBSTNC 04/15/2008  . 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 VITAMIN D DEFICIENCY 11/02/2009  . Voice strain     Family History  Problem Relation Age of Onset  . Lung cancer Mother   . Cancer Mother   . Heart attack Father   .  Hypertension Father   . Alcohol abuse Father   . Heart disease Father   . Breast cancer Sister   . Cancer Sister        breast  . Alcohol abuse Brother   . Hypertension Brother   . Alcohol abuse Brother   . Hypertension Brother   . Alcohol abuse Brother   . Hypertension Brother   . Kidney disease Sister   . Colon cancer Neg Hx   . Esophageal cancer Neg Hx   . Rectal cancer Neg Hx   . Stomach cancer Neg Hx     Social History   Socioeconomic History  . Marital status: Married    Spouse name: Not on file  . Number of children: Not on file  . Years of education: Not on file  . Highest education level: Not on file  Occupational History  . Not on file  Tobacco Use  . Smoking status: Former Smoker    Types:  Cigarettes    Quit date: 04/02/1964    Years since quitting: 56.5  . Smokeless tobacco: Never Used  . Tobacco comment: in early 20's he quit  Vaping Use  . Vaping Use: Never used  Substance and Sexual Activity  . Alcohol use: Yes    Comment: socially  . Drug use: No  . Sexual activity: Not on file  Other Topics Concern  . Not on file  Social History Narrative   Retired [employer]   Married   Non smoker some etoh   Travels a lot            Social Determinants of Radio broadcast assistant Strain:   . Difficulty of Paying Living Expenses: Not on file  Food Insecurity:   . Worried About Charity fundraiser in the Last Year: Not on file  . Ran Out of Food in the Last Year: Not on file  Transportation Needs:   . Lack of Transportation (Medical): Not on file  . Lack of Transportation (Non-Medical): Not on file  Physical Activity:   . Days of Exercise per Week: Not on file  . Minutes of Exercise per Session: Not on file  Stress:   . Feeling of Stress : Not on file  Social Connections:   . Frequency of Communication with Friends and Family: Not on file  . Frequency of Social Gatherings with Friends and Family: Not on file  . Attends Religious Services: Not on file  . Active Member of Clubs or Organizations: Not on file  . Attends Archivist Meetings: Not on file  . Marital Status: Not on file    Outpatient Medications Prior to Visit  Medication Sig Dispense Refill  . acetaminophen (TYLENOL) 500 MG tablet Take 500 mg by mouth in the morning and at bedtime.    Marland Kitchen aspirin 81 MG tablet Take 81 mg by mouth daily.      . Baclofen 5 MG TABS Take 5 mg by mouth 3 (three) times daily. As needed for back spasm caution for sedation (Patient not taking: Reported on 09/16/2020) 24 tablet 0  . Cholecalciferol (VITAMIN D3) 125 MCG (5000 UT) CAPS Take 1 capsule (5,000 Units total) by mouth daily. 90 capsule 3  . clobetasol (OLUX) 0.05 % topical foam APPLY to back and leg 50 g 10    . Coenzyme Q10 100 MG TABS Take 1 tablet by mouth daily.    . DHA-EPA-Vitamin E (OMEGA-3 COMPLEX PO) Take 1,200 mg by mouth  3 (three) times daily. ProOmega LDL     . magnesium oxide (MAG-OX) 400 MG tablet Take 400 mg daily by mouth.    . metoprolol tartrate (LOPRESSOR) 50 MG tablet TAKE 1/2 TABLET BY MOUTH IN THE MORNING AND 1 TABLET BY MOUTH IN THE EVENIG 135 tablet 2  . metroNIDAZOLE (METROCREAM) 0.75 % cream Apply to face 45 g 0  . MULTIPLE VITAMIN PO Take 1 tablet by mouth daily.     Marland Kitchen MYRBETRIQ 25 MG TB24 tablet Take 25 mg by mouth daily.  0  . nitroGLYCERIN (NITROSTAT) 0.4 MG SL tablet PLACE 1 TABLET UNDER TONGUE EVERY 5 MINUTES AS NEEDED FOR CHEST PAIN 25 tablet 3  . Omega-3 Fatty Acids (FISH OIL ADULT GUMMIES PO) Take 1 tablet by mouth daily.    . RABEprazole (ACIPHEX) 20 MG tablet TAKE 1 TABLET BY MOUTH EVERY DAY 90 tablet 0  . tamsulosin (FLOMAX) 0.4 MG CAPS capsule TAKE 1 CAPSULE BY MOUTH EVERY DAY 90 capsule 0  . tiZANidine (ZANAFLEX) 2 MG tablet TAKE ONE TABLET BY MOUTH EVERY 8 HOURS AS NEEDED (MUSCLE SPASMS) FOR UP TO 30 DAYS. (Patient not taking: Reported on 09/16/2020)     Facility-Administered Medications Prior to Visit  Medication Dose Route Frequency Provider Last Rate Last Admin  . 0.9 %  sodium chloride infusion  500 mL Intravenous Continuous Milus Banister, MD         EXAM:  BP 112/68   Pulse 84   Ht 5' 7.5" (1.715 m)   Wt 202 lb (91.6 kg)   SpO2 94%   BMI 31.17 kg/m   Body mass index is 31.17 kg/m.  GENERAL: vitals reviewed and listed above, alert, oriented, appears well hydrated and in no acute distress HEENT: atraumatic, conjunctiva  clear, no obvious abnormalities on inspection of external nose and ears OP :masked NECK: no obvious masses on inspection palpation   CV: HRRR, no clubbing cyanosis or  peripheral edema nl cap refill  MS: moves all extremities independent gait with a cane favors right leg some kyphosis hunched over walking is able to pick up  his right foot with walking but shuffles a bit and great care with turning. Neurologic right leg slightly smaller than left is able to stand on the right leg wobbly with holding on.  Is able to rise on toe and heel holding on.  Although was unable to elicit Achilles reflex he has 1-2 beat clonus bilaterally. I do not appreciate cogwheeling PSYCH: pleasant and cooperative, no obvious depression or anxiety  BP Readings from Last 3 Encounters:  09/27/20 112/68  09/14/20 130/78  09/11/20 (!) 171/91    ASSESSMENT AND PLAN:  Discussed the following assessment and plan:  Weakness of right leg  Previous back surgery Although his pain improved after the steroid treatment and less frequent a few months ago over the last month he is having progressive what he calls weakness in his right lower extremity and understood and according to his wife gait change and decreased motor ability lower extremity.  To do regular ADLs such as get out of bed. I would like Dr. Saul Fordyce team to see him first in regard to his gait change and feeling of increased weakness in his right lower extremity and advised about benefit of imaging physical therapy occupational therapy etc. At this point I do not think another neurologic condition is contributing but consideration of doing her being evaluated by neurologist if persistent progressive problem. -Patient advised to return or notify  health care team  if  new concerns arise. In interim   Patient Instructions  I want you to see Dr Roosvelt Harps team again because the weakness  In the right leg is  getting worse. Question if  updated imaging would help delineate. a worsening  problem since  you were doing  better after surgery and now  Getting worse.     Standley Brooking. Winston Sobczyk M.D.

## 2020-09-27 NOTE — Patient Instructions (Addendum)
I want you to see Dr Roosvelt Harps team again because the weakness  In the right leg is  getting worse. Question if  updated imaging would help delineate. a worsening  problem since  you were doing  better after surgery and now  Getting worse.

## 2020-10-08 ENCOUNTER — Telehealth: Payer: Self-pay

## 2020-10-08 DIAGNOSIS — N138 Other obstructive and reflux uropathy: Secondary | ICD-10-CM

## 2020-10-08 DIAGNOSIS — I10 Essential (primary) hypertension: Secondary | ICD-10-CM

## 2020-10-08 DIAGNOSIS — N401 Enlarged prostate with lower urinary tract symptoms: Secondary | ICD-10-CM

## 2020-10-08 NOTE — Telephone Encounter (Signed)
-----   Message from Viona Gilmore, [facility] sent at 10/08/2020  3:18 PM EDT ----- Regarding: CCM referral Hi,  Can you please put in a CCM referral for Mr. Bram Hottel? Thank you so much!  Best, Maddie  Jeni Salles, PharmD Clinical Pharmacist [place] [facility]

## 2020-10-11 ENCOUNTER — Telehealth: Payer: Self-pay | Admitting: Pharmacist

## 2020-10-11 NOTE — Chronic Care Management (AMB) (Signed)
Chronic Care Management Pharmacy Assistant   Name: [name]  MRN: [id number] DOB: Jul 22, 1941  Reason for Encounter: Medication Review/Initial Questions for Pharmacist visit on 10/13/2020   Patient Questions:Questions and concerns were answered by wife [name] ([place]) 1. Have you seen any other providers since your last visit? No 2. Any changes in your medications or health? No 3. Any side effects from any medications? No  4. Do you have any symptoms or problems not managed by your medications? No 5. Any concerns about your health right now?  No 6. Has your provider asked that you check blood pressure, blood sugar, or follow a special diet at home?  No 7. Do you get any type of exercise regularly?  No 8. Can you think of a goal you would like to reach for your health? No 9. Do you have any problems getting your medications? No 10. Is there anything that you would like to discuss during the appointment? No  PCP : [name], MD  Allergies:   Allergies  Allergen Reactions   Propoxyphene N-Acetaminophen Nausea And Vomiting   Iodinated Diagnostic Agents Swelling    Pt developed slight lt upper lip swelling only to one side about 15 minutes post IV contrast injection. No other symptoms.     Medications: Outpatient Encounter Medications as of 10/11/2020  Medication Sig   acetaminophen (TYLENOL) 500 MG tablet Take 500 mg by mouth in the morning and at bedtime.   aspirin 81 MG tablet Take 81 mg by mouth daily.     Cholecalciferol (VITAMIN D3) 125 MCG (5000 UT) CAPS Take 1 capsule (5,000 Units total) by mouth daily.   clobetasol (OLUX) 0.05 % topical foam APPLY to back and leg   Coenzyme Q10 100 MG TABS Take 1 tablet by mouth daily.   DHA-EPA-Vitamin E (OMEGA-3 COMPLEX PO) Take 1,200 mg by mouth 3 (three) times daily. ProOmega LDL    magnesium oxide (MAG-OX) 400 MG tablet Take 400 mg daily by mouth.   metoprolol tartrate (LOPRESSOR) 50 MG tablet TAKE  1/2 TABLET BY MOUTH IN THE MORNING AND 1 TABLET BY MOUTH IN THE EVENIG   metroNIDAZOLE (METROCREAM) 0.75 % cream Apply to face   MULTIPLE VITAMIN PO Take 1 tablet by mouth daily.    MYRBETRIQ 25 MG TB24 tablet Take 25 mg by mouth daily.   nitroGLYCERIN (NITROSTAT) 0.4 MG SL tablet PLACE 1 TABLET UNDER TONGUE EVERY 5 MINUTES AS NEEDED FOR CHEST PAIN   Omega-3 Fatty Acids (FISH OIL ADULT GUMMIES PO) Take 1 tablet by mouth daily.   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[place]) 05/28/2020   Allergic conjunctivitis of both eyes 04/22/2018   Neck pain on right side 11/03/2014   Urinary frequency 07/27/2014   Medicare annual wellness visit, initial 07/27/2014   Rash and nonspecific skin eruption 07/27/2014   Nodule, subcutaneous 07/27/2014   Atherosclerosis of aorta ([place]) 10/25/2013   Hyperglycemia 03/07/2013   Status post laparoscopic cholecystectomy 03/07/2013   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   Bilateral renal cysts 02/28/2012   Hepatic cyst 02/28/2012   Atherosclerosis  02/28/2012   Contact dermatitis 02/28/2012   Dermatitis 04/03/2011   BACK PAIN, LUMBAR 10/25/2010   NEURITIS 10/25/2010   RHINITIS, CHRONIC 05/11/2010   PALPITATIONS 05/09/2010   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   VITAMIN D DEFICIENCY 11/02/2009   BACK PAIN 11/02/2009   BLADDER NECK OBSTRUCTION 07/07/2009   ATRIAL FIBRILLATION 07/06/2009   CAD 01/22/2009   MUSCLE PAIN 01/22/2009   DYSPNEA ON EXERTION 08/21/2008   VERRUCA VULGARIS 12/31/2007   HYPERGLYCEMIA, BORDERLINE 12/31/2007   LUMBAR RADICULOPATHY,  RIGHT 10/18/2007   HX, PERSONAL, MALIGNANCY, SKIN NEC 10/18/2007   COLONIC POLYPS, HX OF 10/18/2007   ECHOCARDIOGRAM, ABNORMAL 08/19/2007   HYPERLIPIDEMIA 07/25/2007   GERD 07/25/2007   BENIGN PROSTATIC HYPERTROPHY 07/25/2007   Essential hypertension 07/23/2007   ALLERGIC RHINITIS 07/23/2007    Goals Addressed   None     Follow-Up:  Pharmacist Review   Maia Breslow, [place] Assistant [phone]

## 2020-10-12 DIAGNOSIS — R29898 Other symptoms and signs involving the musculoskeletal system: Secondary | ICD-10-CM | POA: Diagnosis not present

## 2020-10-13 ENCOUNTER — Ambulatory Visit: Payer: Medicare Other | Admitting: Pharmacist

## 2020-10-13 DIAGNOSIS — N401 Enlarged prostate with lower urinary tract symptoms: Secondary | ICD-10-CM

## 2020-10-13 DIAGNOSIS — N138 Other obstructive and reflux uropathy: Secondary | ICD-10-CM

## 2020-10-13 DIAGNOSIS — I10 Essential (primary) hypertension: Secondary | ICD-10-CM

## 2020-10-13 NOTE — Chronic Care Management (AMB) (Signed)
Chronic Care Management Pharmacy  Name: [name]  MRN: [id number] DOB: [birth year]  Initial Planning Appointment: completed 10/11/20  Initial Questions: 1. Have you seen any other providers since your last visit? n/a 2. Any changes in your medicines or health? No   Chief Complaint/ HPI  [name],  79 y.o. , male presents for their Initial CCM visit with the clinical pharmacist via telephone due to COVID-19 Pandemic.  PCP : [name], MD  Their chronic conditions include: HTN, Afib. HLD, CAD, GERD, BPH, overactive bladder, rosacea, pain  Office Visits: -09/27/20 Shanon Ace MD: Patient presented with increasing weakness or instability of right lower extremity with cramping in his anterior thigh. Recommended follow up with Dr. Owens Shark (brain and spine surgery specialist).  -09/16/20 Shanon Ace, MD: Patient presented for a video visit for hospitalization follow up. Patient was seen for lower abdomen pain and constipation. Patient has been drinking fluids and using Miralax daily for constipation.  -08/23/20 Shanon Ace, MD: Patient presented for back pain with right-sided sciatica evaluation. Patient tried tizanidine per surgery and did not help and baclofen helped more. Visit with Dr. Owens Shark on 8/3 and was released from care.  -05/06/20 Shanon Ace, MD: Patient presented for video visit for increased back pain. Pain is worsening and accompanied by lower muscle spasms. Prescribed baclofen.   Consult Visit: -10/12/20 Alfred Levins, MD (brain and spine surgery): Patient presented for follow up for right leg weakness. Unable to access notes.  -09/14/20 Truitt Merle, NP (cardiology): Patient presented for ischemic heart disease, HTN, and heart murmur follow up. Patient is not active and has a rare twinge of chest pain. Balance is poor. BP has been ok off the ARB. Follow up in 4 months.  -09/11/20 Patient presented to the ED with abdominal pain and constipation. Patient  has not been able to pass stool for 12 hours.  -08/26/20 Alfred Levins, MD (brain and spine surgery): Patient presented for follow up for MRI. Unable to access notes.  -08/06/20 Robyne Askew, PA-C (dermatology): Patient presented for annual exam for history of basal cell carcinoma and rosacea. Recommended clobetasol for leg and back and metronidazole cream on face.  -07/27/20 Alfred Levins, MD (brain and spine surgery): Patient presented for follow up post-op. Unable to access notes.  -07/14/20 Truitt Merle, NP (cardiology): Patient presented for ischemic heart disease, HTN, and heart murmur follow up. Patient's BP has been soft and stopped ARB. Encouraged use of support stockings, elevating his legs and cutting back on salt use with lower extremity edema. Follow up in 2 months.  -05/30/20 Patient presented for bilateral L2-3, L3-4, and L4-5 laminectomies for decomrpession of stenosis surgery with Alfred Levins.   -05/06/20 Bernerd Pho: Patient presented for chiropractic medicine follow up.   Medications: Outpatient Encounter Medications as of 10/13/2020  Medication Sig  . acetaminophen (TYLENOL) 500 MG tablet Take 500 mg by mouth in the morning and at bedtime.  Marland Kitchen aspirin 81 MG tablet Take 81 mg by mouth daily.    . Cholecalciferol (VITAMIN D3) 125 MCG (5000 UT) CAPS Take 1 capsule (5,000 Units total) by mouth daily.  . clobetasol (OLUX) 0.05 % topical foam APPLY to back and leg  . Coenzyme Q10 100 MG TABS Take 1 tablet by mouth daily.  . magnesium oxide (MAG-OX) 400 MG tablet Take 400 mg daily by mouth.  . metoprolol tartrate (LOPRESSOR) 50 MG tablet TAKE 1/2 TABLET BY MOUTH IN THE MORNING AND 1 TABLET BY MOUTH IN THE EVENIG  .  metroNIDAZOLE (METROCREAM) 0.75 % cream Apply to face  . MULTIPLE VITAMIN PO Take 1 tablet by mouth daily.   Marland Kitchen MYRBETRIQ 25 MG TB24 tablet Take 25 mg by mouth daily.  . nitroGLYCERIN (NITROSTAT) 0.4 MG SL tablet PLACE 1 TABLET UNDER TONGUE EVERY 5 MINUTES AS NEEDED FOR  CHEST PAIN  . Omega-3 Fatty Acids (FISH OIL ADULT GUMMIES PO) Take 1 tablet by mouth daily.  . RABEprazole (ACIPHEX) 20 MG tablet TAKE 1 TABLET BY MOUTH EVERY DAY  . Red Yeast Rice 600 MG CAPS Take 1 capsule by mouth daily.  . tamsulosin (FLOMAX) 0.4 MG CAPS capsule TAKE 1 CAPSULE BY MOUTH EVERY DAY  . DHA-EPA-Vitamin E (OMEGA-3 COMPLEX PO) Take 1,200 mg by mouth 3 (three) times daily. ProOmega LDL    Facility-Administered Encounter Medications as of 10/13/2020  Medication  . 0.9 %  sodium chloride infusion   Patient lives with his wife and he continues to drive while his wife does not. He has 2 daughters. Patient's recent concerns have been back surgery in June and he saw the surgeon yesterday and is going to have a MRI for follow up.   Patient and his wife have a cleaning lady come to their house 3 days a week for 4 hours each day to help out. They eat many of their meals outside of the house at fast food places. For breakfast, patient eats cereal, sometimes eggs and lunch is often outside of the house. They also eat a lot of TV dinners.  For exercise, patient does some right leg exercises and used a recumbent bike a lot before the surgery but is wanting to get back to using it. Patient endorses trouble falling asleep and is up for like 3-4 hours before he falls asleep. He also goes to the bathroom 2-5 times per night and does take naps during the day.  Patient reports he thinks all of his medications are working and denies any current problems with them.     Current Diagnosis/Assessment:  Goals Addressed            This Visit's Progress   . Pharmacy Care Plan       CARE PLAN ENTRY (see longitudinal plan of care for additional care plan information)  Current Barriers:  . Chronic Disease Management support, education, and care coordination needs related to Hypertension, Hyperlipidemia, Atrial Fibrillation, Coronary Artery Disease, GERD, Overactive Bladder, BPH, and pain    Hypertension BP Readings from Last 3 Encounters:  09/27/20 112/68  09/14/20 130/78  09/11/20 (!) 171/91   . Pharmacist Clinical Goal(s): o Over the next 90 days, patient will work with PharmD and providers to maintain BP goal <130/80 . Current regimen:  o Metoprolol tartrate 50 mg 1/2 tablet in the morning and 1 tablet in the evening . Interventions: o Discussed DASH eating plan recommendations: . Emphasizes vegetables, fruits, and whole-grains . Includes fat-free or low-fat dairy products, fish, poultry, beans, nuts, and vegetable oils . Limits foods that are high in saturated fat. These foods include fatty meats, full-fat dairy products, and tropical oils such as coconut, palm kernel, and palm oils. . Limits sugar-sweetened beverages and sweets . Limiting sodium intake to < 1500 mg/day o Discussed recommendations for moderate aerobic exercise for 150 minutes/week spread out over 5 days for heart healthy lifestyle  . Patient self care activities - Over the next 90 days, patient will: o Check blood pressure weekly, document, and provide at future appointments o Ensure daily salt intake <  2300 mg/day  Hyperlipidemia Lab Results  Component Value Date/Time   LDLCALC 119 (H) 10/13/2019 03:23 PM   LDLDIRECT 187.2 03/31/2008 09:25 AM   . Pharmacist Clinical Goal(s): o Over the next 90 days, patient will work with PharmD and providers to achieve LDL goal < 70 . Current regimen:  . Omega 3 complex 1,200 mg one gummy daily . Red yeast rice 1 capsule daily . Interventions: o Discussed lowering cholesterol through diet by: Marland Kitchen Limiting foods with cholesterol such as liver and other organ meats, egg yolks, shrimp, and whole milk dairy products . Avoiding saturated fats and trans fats and incorporating healthier fats, such as lean meat, nuts, and unsaturated oils like canola and olive oils . Eating foods with soluble fiber such as whole-grain cereals such as oatmeal and oat bran, fruits such  as apples, bananas, oranges, pears, and prunes, legumes such as kidney beans, lentils, chick peas, black-eyed peas, and lima beans, and green leafy vegetables . Limiting alcohol intake  . Discussed risks of taking red yeast rice as there have been links to liver failure . Patient self care activities - Over the next 90 days, patient will: o Continue management with diet and lifestyle  Afib . Pharmacist Clinical Goal(s): o Over the next 90 days, patient will work with PharmD and providers to maintain heart rate between 60-100 beats per minute . Current regimen:  o Metoprolol tartrate 50 mg 1/2 tablet in the morning and 1 tablet in the evening . Interventions: o Discussed monitoring heart rate along with blood pressure . Patient self care activities - Over the next 90 days, patient will: o Check heart rate along with blood pressure and keep a log o Continue current medication  Coronary artery disease . Pharmacist Clinical Goal(s) o Over the next 90 days, patient will work with PharmD and providers to prevent heart events . Current regimen:  . Aspirin 81 mg 1 tablet daily . Nitroglycerin 0.4 mg SL tablet as needed . Interventions: o Discussed monitoring for signs of bleeding such as unexplained and excessive bleeding from a cut or injury, easy or excessive bruising, blood in urine or stools, and nosebleeds without a known cause . Patient self care activities - Over the next 90 days, patient will: o Continue current medications  BPH . Pharmacist Clinical Goal(s) o Over the next 90 days, patient will work with PharmD and providers to manage symptoms of BPH . Current regimen:  o Tamsulosin 0.4 mg 1 capsule daily . Interventions: o We discussed consistently taking tamsulosin 30 minutes to an hour after eating to help with absorption . Patient self care activities - Over the next 90 days, patient will: o Continue current medications  Overactive bladder . Pharmacist Clinical  Goal(s) o Over the next 90 days, patient will work with PharmD and providers to manage symptoms of overactive bladder . Current regimen:  o Myrbetriq 25 mg 1 tablet daily . Patient self care activities - Over the next 90 days, patient will: o Continue current medications  GERD . Pharmacist Clinical Goal(s) o Over the next 30 days, patient will work with PharmD and providers to manage symptoms of heartburn/indigestion . Current regimen:  . Rabeprazole 20 mg 1 tablet daily . Interventions: o We discussed risks of long term use of rabeprazole o Discussed on-pharmacologic management of symptoms such as elevating the head of your bed, avoiding eating 2-3 hours before bed, avoiding triggering foods such as acidic, spicy, or fatty foods, eating smaller meals, and wearing clothes  that are loose around the waist o Plan to taper medication . Patient self care activities - Over the next 30 days, patient will: o Decrease to 20 mg every other day for 2 weeks and supplement with Tums as needed for heartburn symptoms and then stop taking  Pain . Pharmacist Clinical Goal(s) o Over the next 90 days, patient will work with PharmD and providers to manage pain . Current regimen:   Tylenol 500 mg 1 tablet in AM and 1 tablet in PM . Ibuprofen as needed . Interventions: o Discussed preference for Tylenol over Advil (ibuprofen) and Aleve as you are currently taking aspirin and do not want to increase bleed risk . Patient self care activities - Over the next 90 days, patient will: o Continue current medications  Medication management . Pharmacist Clinical Goal(s): o Over the next 90 days, patient will work with PharmD and providers to maintain optimal medication adherence . Current [facility] . Interventions o Comprehensive medication review performed. o Continue current medication management strategy . Patient self care activities - Over the next 90 days, patient will: o Take medications as  prescribed o Report any questions or concerns to PharmD and/or provider(s)  Initial goal documentation       Hypertension   BP goal is:  <130/80  Office blood pressures are  BP Readings from Last 3 Encounters:  09/27/20 112/68  09/14/20 130/78  09/11/20 (!) 171/91   Patient checks BP at home 1-2x per week Patient home BP readings are ranging: high 140; low of 80; typically in the 110s  Patient has failed these meds in the past:  Patient is currently controlled on the following medications:  Marland Kitchen Metoprolol tartrate 50 mg 1/2 tablet in the morning and 1 tablet in the evening  We discussed diet and exercise extensively - DASH eating plan recommendations: . Emphasizes vegetables, fruits, and whole-grains . Includes fat-free or low-fat dairy products, fish, poultry, beans, nuts, and vegetable oils . Limits foods that are high in saturated fat. These foods include fatty meats, full-fat dairy products, and tropical oils such as coconut, palm kernel, and palm oils. . Limits sugar-sweetened beverages and sweets . Limiting sodium intake to < 1500 mg/day -Diet: patient avoids adding extra salt but a lot of his fast food meals and TV dinners have sodium -Discussed recommendations for moderate aerobic exercise for 150 minutes/week spread out over 5 days for heart healthy lifestyle  Plan Denies dizziness/lightheadedness. Continue current medications   AFIB   Patient is currently rate controlled. Office heart rates are  Pulse Readings from Last 3 Encounters:  09/27/20 84  09/14/20 79  09/11/20 88    CHA2DS2-VASc Score =   3 The patient's score is based upon: age > 76 and HTN    :[id number]}   Patient has failed these meds in past: none Patient is currently controlled on the following medications:   Metoprolol tartrate 50 mg 1/2 tablet in the morning and 1 tablet in the evening  We discussed:  monitoring heart rate along with blood pressure  Plan  Continue current  medications    Hyperlipidemia   LDL goal < 70  Lipid Panel     Component Value Date/Time   CHOL 180 10/13/2019 1523   TRIG 126 10/13/2019 1523   HDL 38 (L) 10/13/2019 1523   LDLCALC 119 (H) 10/13/2019 1523   LDLDIRECT 187.2 03/31/2008 0925    Hepatic Function Latest Ref Rng & Units 10/13/2019 02/19/2019 08/14/2018  Total Protein 6.0 -  8.5 g/dL 6.2 6.4 6.4  Albumin 3.7 - 4.7 g/dL 4.4 4.3 4.4  AST 0 - 40 IU/L 13 17 16   ALT 0 - 44 IU/L 19 20 20   Alk Phosphatase 39 - 117 IU/L 72 62 64  Total Bilirubin 0.0 - 1.2 mg/dL 0.6 0.6 0.7  Bilirubin, Direct 0.00 - 0.40 mg/dL 0.13 0.16 0.17     The ASCVD Risk score ([place]., et al., 2013) failed to calculate for the following reasons:   The 2013 ASCVD risk score is only valid for ages 29 to 34   Patient has failed these meds in past: atorvastatin, pravastatin (myalgias), Zetia  Patient is currently uncontrolled on the following medications:  . Omega 3 complex 1,200 mg one gummy daily . Red yeast rice 1 capsule daily  We discussed:  diet and exercise extensively -Diet: patient does not use butter or oil to cook -Lowering cholesterol through diet by: Marland Kitchen Limiting foods with cholesterol such as liver and other organ meats, egg yolks, shrimp, and whole milk dairy products . Avoiding saturated fats and trans fats and incorporating healthier fats, such as lean meat, nuts, and unsaturated oils like canola and olive oils . Eating foods with soluble fiber such as whole-grain cereals such as oatmeal and oat bran, fruits such as apples, bananas, oranges, pears, and prunes, legumes such as kidney beans, lentils, chick peas, black-eyed peas, and lima beans, and green leafy vegetables . Limiting alcohol intake -Exercise -Cautioned against using red yeast rice given risks with extra ingredients and links to liver failure -Discussed the benefits of statin therapy but patient declined trying again  Plan  Continue control with diet and exercise   CAD    Patient is currently controlled on the following medications:  . Aspirin 81 mg 1 tablet daily . Nitroglycerin 0.4 mg SL tablet PRN  We discussed: encouraged checking expiration date on nitroglycerin tablets; Monitoring for signs of bleeding such as unexplained and excessive bleeding from a cut or injury, easy or excessive bruising, blood in urine or stools, and nosebleeds without a known cause  Plan  Continue current medications  GERD   Patient has failed these meds in past: none Patient is currently controlled on the following medications:  . Rabeprazole 20 mg 1 tablet daily  We discussed:  Patient denies any symptoms of heart burn/indigestion; Non-pharmacologic management of symptoms such as elevating the head of your bed, avoiding eating 2-3 hours before bed, avoiding triggering foods such as acidic, spicy, or fatty foods, eating smaller meals, and wearing clothes that are loose around the waist  Plan Will discuss with PCP about tapering off. Continue current medications  BPH   PSA  Date Value Ref Range Status  02/21/2019 1.49  Final  09/03/2012 1.88 0.10 - 4.00 ng/mL Final     Patient has failed these meds in past: none Patient is currently controlled on the following medications:  . Tamsulosin 0.4 mg 1 capsule daily - in AM  We discussed:  Consistent administration of medication in regards to 30 minutes to an hour after meals  Plan  Continue current medications   Overactive bladder   Patient is currently uncontrolled on the following medications:  Marland Kitchen Myrbetriq 25 mg 1 tablet daily  We discussed:  Increasing the dose of Myrbetriq - patient declined as he had side effects with a higher dose; discussed getting a new urology referral as patient was not satisfied with Dr. Lovena Neighbours but patient decided against it at this time  Plan  Continue current medications   Rosacea   Patient is currently controlled on the following medications:  Marland Kitchen Metronidazole cream 0.75% as  needed  Plan  Continue current medications   Pain   Patient is currently controlled on the following medications:   Tylenol 500 mg 1 tablet in AM and 1 tablet in PM . Ibuprofen as needed  We discussed:  Preference for Tylenol over NSAIDs for pain due to bleeding risk with concurrent use of aspirin ; maximum daily recommendation of 3,000 mg of Tylenol  Plan  Continue current medications   Vitamins/supplements   Last vitamin D Lab Results  Component Value Date   VD25OH 29.3 (L) 10/13/2019   Patient is currently  on the following medications:  Marland Kitchen Vitamin D3 5000 units 1 capsule daily  . Coenzyme Q10 gummy 100 mg 1 tablet daily . Magnesium 400 mg gummy 1 tablet daily . Multivitamin 1 tablet daily   Plan Recommend repeat vitamin D level given history of vitamin D deficiency. Continue current medications  Vaccines   Reviewed and discussed patient's vaccination history.    Immunization History  Administered Date(s) Administered  . Fluad Quad(high Dose 65+) 09/23/2019  . Influenza Split 10/13/2011, 09/02/2012, 12/29/2013  . Influenza Whole 10/18/2007, 09/21/2009, 09/15/2010  . Influenza, High Dose Seasonal PF 09/21/2014, 08/18/2015, 10/09/2016, 08/31/2017, 09/10/2018, 08/23/2020  . Influenza-Unspecified 08/31/2017  . Pneumococcal Conjugate-13 07/27/2014  . Pneumococcal Polysaccharide-23 02/09/2016  . Tdap 08/18/2015  . Zoster 03/12/2014   Patient reported getting [place] vaccine on  02/17/20 and 03/09/20. Will send message to CMA to add to chart.   Plan  Recommended patient receive Shingrix vaccine at pharmacy.   Medication Management   [facility] Uses pill box? Yes - weekly (blue for him and white for herself) Pt endorses 100% compliance - not often twice a month  We discussed: Current pharmacy is preferred with insurance plan and patient is satisfied with pharmacy services  Plan  Continue current medication management  strategy   Follow up: 3 month phone visit   Jeni Salles, PharmD Clinical Pharmacist [place] [facility] [place]

## 2020-10-22 NOTE — Patient Instructions (Addendum)
Hi Adis!  It was lovely to get to speak with you over the phone! As we discussed, continue taking your medications as prescribed and continue checking your blood pressure regularly. We did discuss making some dietary and lifestyle changes to lower your cholesterol and lower your chances of having a heart attack or stroke. I did attach some information about that as well.  To taper off of rabeprazole 20 mg daily: 1. Decreased to taking every other day for 2 weeks and you can use Tums as needed for any indigestion.   2. After 2 weeks, stop taking rabeprazole and use Tums as needed. If symptoms are bothersome, switch to taking rabeprazole every 3 days for 2 weeks and then stop.  Please call me if you need anything or have any questions before our next touch base!  Best, Maddie  Jeni Salles, PharmD Clinical Pharmacist [facility]   Visit Information  Goals Addressed            This Visit's Progress   . Pharmacy Care Plan       CARE PLAN ENTRY (see longitudinal plan of care for additional care plan information)  Current Barriers:  . Chronic Disease Management support, education, and care coordination needs related to Hypertension, Hyperlipidemia, Atrial Fibrillation, Coronary Artery Disease, GERD, Overactive Bladder, BPH, and pain   Hypertension BP Readings from Last 3 Encounters:  09/27/20 112/68  09/14/20 130/78  09/11/20 (!) 171/91   . Pharmacist Clinical Goal(s): o Over the next 90 days, patient will work with PharmD and providers to maintain BP goal <130/80 . Current regimen:  o Metoprolol tartrate 50 mg 1/2 tablet in the morning and 1 tablet in the evening . Interventions: o Discussed DASH eating plan recommendations: . Emphasizes vegetables, fruits, and whole-grains . Includes fat-free or low-fat dairy products, fish, poultry, beans, nuts, and vegetable oils . Limits foods that are high in saturated fat. These foods include  fatty meats, full-fat dairy products, and tropical oils such as coconut, palm kernel, and palm oils. . Limits sugar-sweetened beverages and sweets . Limiting sodium intake to < 1500 mg/day o Discussed recommendations for moderate aerobic exercise for 150 minutes/week spread out over 5 days for heart healthy lifestyle  . Patient self care activities - Over the next 90 days, patient will: o Check blood pressure weekly, document, and provide at future appointments o Ensure daily salt intake < 2300 mg/day  Hyperlipidemia Lab Results  Component Value Date/Time   LDLCALC 119 (H) 10/13/2019 03:23 PM   LDLDIRECT 187.2 03/31/2008 09:25 AM   . Pharmacist Clinical Goal(s): o Over the next 90 days, patient will work with PharmD and providers to achieve LDL goal < 70 . Current regimen:  . Omega 3 complex 1,200 mg one gummy daily . Red yeast rice 1 capsule daily . Interventions: o Discussed lowering cholesterol through diet by: Marland Kitchen Limiting foods with cholesterol such as liver and other organ meats, egg yolks, shrimp, and whole milk dairy products . Avoiding saturated fats and trans fats and incorporating healthier fats, such as lean meat, nuts, and unsaturated oils like canola and olive oils . Eating foods with soluble fiber such as whole-grain cereals such as oatmeal and oat bran, fruits such as apples, bananas, oranges, pears, and prunes, legumes such as kidney beans, lentils, chick peas, black-eyed peas, and lima beans, and green leafy vegetables . Limiting alcohol intake  . Discussed risks of taking red yeast rice as there have been links to liver failure .  Patient self care activities - Over the next 90 days, patient will: o Continue management with diet and lifestyle  Afib . Pharmacist Clinical Goal(s): o Over the next 90 days, patient will work with PharmD and providers to maintain heart rate between 60-100 beats per minute . Current regimen:  o Metoprolol tartrate 50 mg 1/2 tablet in the  morning and 1 tablet in the evening . Interventions: o Discussed monitoring heart rate along with blood pressure . Patient self care activities - Over the next 90 days, patient will: o Check heart rate along with blood pressure and keep a log o Continue current medication  Coronary artery disease . Pharmacist Clinical Goal(s) o Over the next 90 days, patient will work with PharmD and providers to prevent heart events . Current regimen:  . Aspirin 81 mg 1 tablet daily . Nitroglycerin 0.4 mg SL tablet as needed . Interventions: o Discussed monitoring for signs of bleeding such as unexplained and excessive bleeding from a cut or injury, easy or excessive bruising, blood in urine or stools, and nosebleeds without a known cause . Patient self care activities - Over the next 90 days, patient will: o Continue current medications  BPH . Pharmacist Clinical Goal(s) o Over the next 90 days, patient will work with PharmD and providers to manage symptoms of BPH . Current regimen:  o Tamsulosin 0.4 mg 1 capsule daily . Interventions: o We discussed consistently taking tamsulosin 30 minutes to an hour after eating to help with absorption . Patient self care activities - Over the next 90 days, patient will: o Continue current medications  Overactive bladder . Pharmacist Clinical Goal(s) o Over the next 90 days, patient will work with PharmD and providers to manage symptoms of overactive bladder . Current regimen:  o Myrbetriq 25 mg 1 tablet daily . Patient self care activities - Over the next 90 days, patient will: o Continue current medications  GERD . Pharmacist Clinical Goal(s) o Over the next 30 days, patient will work with PharmD and providers to manage symptoms of heartburn/indigestion . Current regimen:  . Rabeprazole 20 mg 1 tablet daily . Interventions: o We discussed risks of long term use of rabeprazole o Discussed on-pharmacologic management of symptoms such as elevating the  head of your bed, avoiding eating 2-3 hours before bed, avoiding triggering foods such as acidic, spicy, or fatty foods, eating smaller meals, and wearing clothes that are loose around the waist o Plan to taper medication . Patient self care activities - Over the next 30 days, patient will: o Decrease to 20 mg every other day for 2 weeks and supplement with Tums as needed for heartburn symptoms and then stop taking  Pain . Pharmacist Clinical Goal(s) o Over the next 90 days, patient will work with PharmD and providers to manage pain . Current regimen:   Tylenol 500 mg 1 tablet in AM and 1 tablet in PM . Ibuprofen as needed . Interventions: o Discussed preference for Tylenol over Advil (ibuprofen) and Aleve as you are currently taking aspirin and do not want to increase bleed risk . Patient self care activities - Over the next 90 days, patient will: o Continue current medications  Medication management . Pharmacist Clinical Goal(s): o Over the next 90 days, patient will work with PharmD and providers to maintain optimal medication adherence . Current pharmacy: [facility] . Interventions o Comprehensive medication review performed. o Continue current medication management strategy . Patient self care activities - Over the next 90 days,  patient will: o Take medications as prescribed o Report any questions or concerns to PharmD and/or provider(s)  Initial goal documentation        Mr. Hemmer was given information about Chronic Care Management services today including:  1. CCM service includes personalized support from designated clinical staff supervised by his physician, including individualized plan of care and coordination with other care providers 2. 24/7 contact phone numbers for assistance for urgent and routine care needs. 3. Standard insurance, coinsurance, copays and deductibles apply for chronic care management only during months in which we provide at least 20 minutes of these  services. Most insurances cover these services at 100%, however patients may be responsible for any copay, coinsurance and/or deductible if applicable. This service may help you avoid the need for more expensive face-to-face services. 4. Only one practitioner may furnish and bill the service in a calendar month. 5. The patient may stop CCM services at any time (effective at the end of the month) by phone call to the office staff.  Patient agreed to services and verbal consent obtained.   The patient verbalized understanding of instructions provided today and agreed to receive a mailed copy of patient instruction and/or educational materials. Telephone follow up appointment with pharmacy team member scheduled for: 3 months   Heartburn Heartburn is a type of pain or discomfort that can happen in the throat or chest. It is often described as a burning pain. It may also cause a bad, acid-like taste in the mouth. Heartburn may feel worse when you lie down or bend over. It may be worse at night. It may be caused by stomach contents that move back up (reflux) into the tube that connects the mouth with the stomach (esophagus). Follow these instructions at home: Eating and drinking   Avoid certain foods and drinks as told by your doctor. This may include: ? Coffee and tea (with or without caffeine). ? Drinks that have alcohol. ? Energy drinks and sports drinks. ? Carbonated drinks or sodas. ? Chocolate and cocoa. ? Peppermint and mint flavorings. ? Garlic and onions. ? Horseradish. ? Spicy and acidic foods, such as:  Peppers.  Chili powder and curry powder.  Vinegar.  Hot sauces and BBQ sauce. ? Citrus fruit juices and citrus fruits, such as:  Oranges.  Lemons.  Limes. ? Tomato-based foods, such as:  Red sauce and pizza with red sauce.  Chili.  Salsa. ? Fried and fatty foods, such as:  Donuts.  [place] fries and potato chips.  High-fat dressings. ? High-fat meats, such  as:  Hot dogs and sausage.  Rib eye steak.  Ham and bacon. ? High-fat dairy items, such as:  Whole milk.  Butter.  Cream cheese.  Eat small meals often. Avoid eating large meals.  Avoid drinking large amounts of liquid with your meals.  Avoid eating meals during the 2-3 hours before bedtime.  Avoid lying down right after you eat.  Do not exercise right after you eat. Lifestyle      If you are overweight, lose an amount of weight that is healthy for you. Ask your doctor about a safe weight loss goal.  Do not use any products that contain nicotine or tobacco, including cigarettes, e-cigarettes, and chewing tobacco. These can make your symptoms worse. If you need help quitting, ask your doctor.  Wear loose clothes. Do not wear anything tight around your waist.  Raise (elevate) the head of your bed about 6 inches (15 cm) when you sleep.  Try to lower your stress. If you need help doing this, ask your doctor. General instructions  Pay attention to any changes in your symptoms.  Take over-the-counter and prescription medicines only as told by your doctor. ? Do not take aspirin, ibuprofen, or other NSAIDs unless your doctor says it is okay. ? Stop medicines only as told by your doctor.  Keep all follow-up visits as told by your doctor. This is important. Contact a doctor if:  You have new symptoms.  You lose weight and you do not know why it is happening.  You have trouble swallowing, or it hurts to swallow.  You have wheezing or a cough that keeps happening.  Your symptoms do not get better with treatment.  You have heartburn often for more than 2 weeks. Get help right away if:  You have pain in your arms, neck, jaw, teeth, or back.  You feel sweaty, dizzy, or light-headed.  You have chest pain or shortness of breath.  You throw up (vomit) and your throw up looks like blood or coffee grounds.  Your poop (stool) is bloody or black. These symptoms may  represent a serious problem that is an emergency. Do not wait to see if the symptoms will go away. Get medical help right away. Call your local emergency services (911 in the [place] Do not drive yourself to the hospital. Summary  Heartburn is a type of pain that can happen in the throat or chest. It can feel like a burning pain. It may also cause a bad, acid-like taste in the mouth.  You may need to avoid certain foods and drinks to help your symptoms. Ask your doctor what foods and drinks you should avoid.  Take over-the-counter and prescription medicines only as told by your doctor. Do not take aspirin, ibuprofen, or other NSAIDs unless your doctor told you to do so.  Contact your doctor if your symptoms do not get better or they get worse. This information is not intended to replace advice given to you by your health care provider. Make sure you discuss any questions you have with your health care provider. Document Revised: 05/13/2018 Document Reviewed: 05/13/2018 Elsevier Patient Education  [place].

## 2020-10-28 DIAGNOSIS — M47817 Spondylosis without myelopathy or radiculopathy, lumbosacral region: Secondary | ICD-10-CM | POA: Diagnosis not present

## 2020-10-28 DIAGNOSIS — M5126 Other intervertebral disc displacement, lumbar region: Secondary | ICD-10-CM | POA: Diagnosis not present

## 2020-10-28 DIAGNOSIS — M4807 Spinal stenosis, lumbosacral region: Secondary | ICD-10-CM | POA: Diagnosis not present

## 2020-10-28 DIAGNOSIS — M419 Scoliosis, unspecified: Secondary | ICD-10-CM | POA: Diagnosis not present

## 2020-10-28 DIAGNOSIS — M48061 Spinal stenosis, lumbar region without neurogenic claudication: Secondary | ICD-10-CM | POA: Diagnosis not present

## 2020-10-28 DIAGNOSIS — M47816 Spondylosis without myelopathy or radiculopathy, lumbar region: Secondary | ICD-10-CM | POA: Diagnosis not present

## 2020-10-28 DIAGNOSIS — M5127 Other intervertebral disc displacement, lumbosacral region: Secondary | ICD-10-CM | POA: Diagnosis not present

## 2020-10-28 DIAGNOSIS — M4316 Spondylolisthesis, lumbar region: Secondary | ICD-10-CM | POA: Diagnosis not present

## 2020-11-16 DIAGNOSIS — M5126 Other intervertebral disc displacement, lumbar region: Secondary | ICD-10-CM | POA: Diagnosis not present

## 2020-11-16 DIAGNOSIS — R29898 Other symptoms and signs involving the musculoskeletal system: Secondary | ICD-10-CM | POA: Diagnosis not present

## 2020-11-29 NOTE — Progress Notes (Deleted)
Subjective:   KAVAUGHN FAUCETT is a 79 y.o. male who presents for Medicare Annual/Subsequent preventive examination.  Review of Systems    N/A        Objective:    There were no vitals filed for this visit. There is no height or weight on file to calculate BMI.  Advanced Directives 09/11/2020 10/23/2019 04/17/2017 04/10/2017  Does Patient Have a Medical Advance Directive? No No No No  Would patient like information on creating a medical advance directive? - No - Patient declined - -    Current Medications (verified) Outpatient Encounter Medications as of 11/30/2020  Medication Sig  . acetaminophen (TYLENOL) 500 MG tablet Take 500 mg by mouth in the morning and at bedtime.  Marland Kitchen aspirin 81 MG tablet Take 81 mg by mouth daily.    . Cholecalciferol (VITAMIN D3) 125 MCG (5000 UT) CAPS Take 1 capsule (5,000 Units total) by mouth daily.  . clobetasol (OLUX) 0.05 % topical foam APPLY to back and leg  . Coenzyme Q10 100 MG TABS Take 1 tablet by mouth daily.  . DHA-EPA-Vitamin E (OMEGA-3 COMPLEX PO) Take 1,200 mg by mouth 3 (three) times daily. ProOmega LDL   . magnesium oxide (MAG-OX) 400 MG tablet Take 400 mg daily by mouth.  . metoprolol tartrate (LOPRESSOR) 50 MG tablet TAKE 1/2 TABLET BY MOUTH IN THE MORNING AND 1 TABLET BY MOUTH IN THE EVENIG  . metroNIDAZOLE (METROCREAM) 0.75 % cream Apply to face  . MULTIPLE VITAMIN PO Take 1 tablet by mouth daily.   Marland Kitchen MYRBETRIQ 25 MG TB24 tablet Take 25 mg by mouth daily.  . nitroGLYCERIN (NITROSTAT) 0.4 MG SL tablet PLACE 1 TABLET UNDER TONGUE EVERY 5 MINUTES AS NEEDED FOR CHEST PAIN  . Omega-3 Fatty Acids (FISH OIL ADULT GUMMIES PO) Take 1 tablet by mouth daily.  . RABEprazole (ACIPHEX) 20 MG tablet TAKE 1 TABLET BY MOUTH EVERY DAY  . Red Yeast Rice 600 MG CAPS Take 1 capsule by mouth daily.  . tamsulosin (FLOMAX) 0.4 MG CAPS capsule TAKE 1 CAPSULE BY MOUTH EVERY DAY   Facility-Administered Encounter Medications as of 11/30/2020  Medication  .  0.9 %  sodium chloride infusion    Allergies (verified) Propoxyphene n-acetaminophen and Iodinated diagnostic agents   History: Past Medical History:  Diagnosis Date  . Abdominal pain, right lateral 02/21/2012   progressive.    . ALLERGIC RHINITIS 07/23/2007  . Allergy   . Arthritis   . Atrial fibrillation ([place]) 07/06/2009  . Atypical mole 09/13/1992   left shoulder Dr Cheryln Manly exc  . Atypical nevi 10/08/1992   right abdomen Dr Francisca December  . BACK PAIN 11/02/2009  . BACK PAIN, LUMBAR 10/25/2010  . Basal cell carcinoma 08/06/1992   right post thigh Dr Cheryln Manly  . BCC (basal cell carcinoma of skin) 04/23/2018   left post shoulder tx with bx  . BENIGN PROSTATIC HYPERTROPHY 07/25/2007  . 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 CAD 01/22/2009  . Cancer ([place])    skin  . 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  . Cataract   . COLONIC POLYPS, HX OF 10/18/2007  . Cough 10/25/2010  . DIZZINESS 01/22/2009  . DYSPNEA ON EXERTION 08/21/2008  . ECHOCARDIOGRAM, ABNORMAL 08/19/2007  . GERD 07/25/2007  . Heart murmur   . HERNIA 07/06/2009  . HX, PERSONAL, MALIGNANCY, SKIN NEC 10/18/2007  . HYPERGLYCEMIA, BORDERLINE 12/31/2007  . HYPERLIPIDEMIA 07/25/2007  . HYPERTENSION 07/23/2007  . Inguinal hernia   . LUMBAR RADICULOPATHY, RIGHT 10/18/2007  . MUSCLE  PAIN 01/22/2009  . Nausea   . NEURITIS 10/25/2010  . Palpitations 05/09/2010  . RHINITIS, CHRONIC 05/11/2010  . RUQ PAIN 01/22/2009  . Sinus polyp   . UNS ADVRS EFF OTH RX MEDICINAL&BIOLOGICAL SBSTNC 04/15/2008  . 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 VITAMIN D DEFICIENCY 11/02/2009  . Voice strain    Past Surgical History:  Procedure Laterality Date  . BACK SURGERY    . basal cell and melanoma removed     face  . BONY PELVIS SURGERY    . CATARACT EXTRACTION     BIL  . COLONOSCOPY    . CORONARY ARTERY BYPASS GRAFT  2009  . HERNIA REPAIR  7 times   inguinal  . LAPAROSCOPIC CHOLECYSTECTOMY  03/03/2013  . LUMBAR SPINE SURGERY    . POLYPECTOMY    .  TONSILLECTOMY     Family History  Problem Relation Age of Onset  . Lung cancer Mother   . Cancer Mother   . Heart attack Father   . Hypertension Father   . Alcohol abuse Father   . Heart disease Father   . Breast cancer Sister   . Cancer Sister        breast  . Alcohol abuse Brother   . Hypertension Brother   . Alcohol abuse Brother   . Hypertension Brother   . Alcohol abuse Brother   . Hypertension Brother   . Kidney disease Sister   . Colon cancer Neg Hx   . Esophageal cancer Neg Hx   . Rectal cancer Neg Hx   . Stomach cancer Neg Hx    Social History   Socioeconomic History  . Marital status: Married    Spouse name: Not on file  . Number of children: Not on file  . Years of education: Not on file  . Highest education level: Not on file  Occupational History  . Not on file  Tobacco Use  . Smoking status: Former Smoker    Types: Cigarettes    Quit date: 04/02/1964    Years since quitting: 56.6  . Smokeless tobacco: Never Used  . Tobacco comment: in early 20's he quit  Vaping Use  . Vaping Use: Never used  Substance and Sexual Activity  . Alcohol use: Yes    Comment: socially  . Drug use: No  . Sexual activity: Not on file  Other Topics Concern  . Not on file  Social History Narrative   Retired [employer]   Married   Non smoker some etoh   Travels a lot            Social Determinants of Radio broadcast assistant Strain:   . Difficulty of Paying Living Expenses: Not on file  Food Insecurity:   . Worried About Charity fundraiser in the Last Year: Not on file  . Ran Out of Food in the Last Year: Not on file  Transportation Needs:   . Lack of Transportation (Medical): Not on file  . Lack of Transportation (Non-Medical): Not on file  Physical Activity:   . Days of Exercise per Week: Not on file  . Minutes of Exercise per Session: Not on file  Stress:   . Feeling of Stress : Not on file  Social Connections:   . Frequency of Communication with  Friends and Family: Not on file  . Frequency of Social Gatherings with Friends and Family: Not on file  . Attends Religious Services: Not on file  . Active  Member of Clubs or Organizations: Not on file  . Attends Archivist Meetings: Not on file  . Marital Status: Not on file    Tobacco Counseling Counseling given: Not Answered Comment: in early 20's he quit   Clinical Intake:                 Diabetic?No          Activities of Daily Living No flowsheet data found.  Patient Care Team: [name], MD as PCP - General Josue Hector, MD (Cardiology) Rexene Alberts, MD (Thoracic Diseases) Milus Banister, MD as Attending Physician (Gastroenterology) Luna Kitchens., MD as Referring Physician (Neurosurgery) Viona Gilmore, [facility] as Pharmacist (Pharmacist)  Indicate any recent Medical Services you may have received from other than [facility] providers in the past year (date may be approximate).     Assessment:   This is a routine wellness examination for Ronan.  Hearing/Vision screen No exam data present  Dietary issues and exercise activities discussed:    Goals    . Pharmacy Care Plan     CARE PLAN ENTRY (see longitudinal plan of care for additional care plan information)  Current Barriers:  . Chronic Disease Management support, education, and care coordination needs related to Hypertension, Hyperlipidemia, Atrial Fibrillation, Coronary Artery Disease, GERD, Overactive Bladder, BPH, and pain   Hypertension BP Readings from Last 3 Encounters:  09/27/20 112/68  09/14/20 130/78  09/11/20 (!) 171/91   . Pharmacist Clinical Goal(s): o Over the next 90 days, patient will work with PharmD and providers to maintain BP goal <130/80 . Current regimen:  o Metoprolol tartrate 50 mg 1/2 tablet in the morning and 1 tablet in the evening . Interventions: o Discussed DASH eating plan recommendations: . Emphasizes vegetables, fruits, and  whole-grains . Includes fat-free or low-fat dairy products, fish, poultry, beans, nuts, and vegetable oils . Limits foods that are high in saturated fat. These foods include fatty meats, full-fat dairy products, and tropical oils such as coconut, palm kernel, and palm oils. . Limits sugar-sweetened beverages and sweets . Limiting sodium intake to < 1500 mg/day o Discussed recommendations for moderate aerobic exercise for 150 minutes/week spread out over 5 days for heart healthy lifestyle  . Patient self care activities - Over the next 90 days, patient will: o Check blood pressure weekly, document, and provide at future appointments o Ensure daily salt intake < 2300 mg/day  Hyperlipidemia Lab Results  Component Value Date/Time   LDLCALC 119 (H) 10/13/2019 03:23 PM   LDLDIRECT 187.2 03/31/2008 09:25 AM   . Pharmacist Clinical Goal(s): o Over the next 90 days, patient will work with PharmD and providers to achieve LDL goal < 70 . Current regimen:  . Omega 3 complex 1,200 mg one gummy daily . Red yeast rice 1 capsule daily . Interventions: o Discussed lowering cholesterol through diet by: Marland Kitchen Limiting foods with cholesterol such as liver and other organ meats, egg yolks, shrimp, and whole milk dairy products . Avoiding saturated fats and trans fats and incorporating healthier fats, such as lean meat, nuts, and unsaturated oils like canola and olive oils . Eating foods with soluble fiber such as whole-grain cereals such as oatmeal and oat bran, fruits such as apples, bananas, oranges, pears, and prunes, legumes such as kidney beans, lentils, chick peas, black-eyed peas, and lima beans, and green leafy vegetables . Limiting alcohol intake  . Discussed risks of taking red yeast rice as there have been  links to liver failure . Patient self care activities - Over the next 90 days, patient will: o Continue management with diet and lifestyle  Afib . Pharmacist Clinical Goal(s): o Over the next 90  days, patient will work with PharmD and providers to maintain heart rate between 60-100 beats per minute . Current regimen:  o Metoprolol tartrate 50 mg 1/2 tablet in the morning and 1 tablet in the evening . Interventions: o Discussed monitoring heart rate along with blood pressure . Patient self care activities - Over the next 90 days, patient will: o Check heart rate along with blood pressure and keep a log o Continue current medication  Coronary artery disease . Pharmacist Clinical Goal(s) o Over the next 90 days, patient will work with PharmD and providers to prevent heart events . Current regimen:  . Aspirin 81 mg 1 tablet daily . Nitroglycerin 0.4 mg SL tablet as needed . Interventions: o Discussed monitoring for signs of bleeding such as unexplained and excessive bleeding from a cut or injury, easy or excessive bruising, blood in urine or stools, and nosebleeds without a known cause . Patient self care activities - Over the next 90 days, patient will: o Continue current medications  BPH . Pharmacist Clinical Goal(s) o Over the next 90 days, patient will work with PharmD and providers to manage symptoms of BPH . Current regimen:  o Tamsulosin 0.4 mg 1 capsule daily . Interventions: o We discussed consistently taking tamsulosin 30 minutes to an hour after eating to help with absorption . Patient self care activities - Over the next 90 days, patient will: o Continue current medications  Overactive bladder . Pharmacist Clinical Goal(s) o Over the next 90 days, patient will work with PharmD and providers to manage symptoms of overactive bladder . Current regimen:  o Myrbetriq 25 mg 1 tablet daily . Patient self care activities - Over the next 90 days, patient will: o Continue current medications  GERD . Pharmacist Clinical Goal(s) o Over the next 30 days, patient will work with PharmD and providers to manage symptoms of heartburn/indigestion . Current regimen:   . Rabeprazole 20 mg 1 tablet daily . Interventions: o We discussed risks of long term use of rabeprazole o Discussed on-pharmacologic management of symptoms such as elevating the head of your bed, avoiding eating 2-3 hours before bed, avoiding triggering foods such as acidic, spicy, or fatty foods, eating smaller meals, and wearing clothes that are loose around the waist o Plan to taper medication . Patient self care activities - Over the next 30 days, patient will: o Decrease to 20 mg every other day for 2 weeks and supplement with Tums as needed for heartburn symptoms and then stop taking  Pain . Pharmacist Clinical Goal(s) o Over the next 90 days, patient will work with PharmD and providers to manage pain . Current regimen:   Tylenol 500 mg 1 tablet in AM and 1 tablet in PM . Ibuprofen as needed . Interventions: o Discussed preference for Tylenol over Advil (ibuprofen) and Aleve as you are currently taking aspirin and do not want to increase bleed risk . Patient self care activities - Over the next 90 days, patient will: o Continue current medications  Medication management . Pharmacist Clinical Goal(s): o Over the next 90 days, patient will work with PharmD and providers to maintain optimal medication adherence . Current pharmacy: [facility] . Interventions o Comprehensive medication review performed. o Continue current medication management strategy . Patient self care activities -  Over the next 90 days, patient will: o Take medications as prescribed o Report any questions or concerns to PharmD and/or provider(s)  Initial goal documentation       Depression Screen PHQ 2/9 Scores 04/09/2019 02/20/2017 02/09/2016 07/27/2014  PHQ - 2 Score 0 0 0 0    Fall Risk Fall Risk  04/09/2019 02/20/2017 02/09/2016 07/27/2014  Falls in the past year? 0 No No No    FALL RISK PREVENTION PERTAINING TO THE HOME:  Any stairs in or around the home? {YES/NO:21197} If so, are there any without  handrails? No  Home free of loose throw rugs in walkways, pet beds, electrical cords, etc? Yes  Adequate lighting in your home to reduce risk of falls? Yes   ASSISTIVE DEVICES UTILIZED TO PREVENT FALLS:  Life alert? {YES/NO:21197} Use of a cane, walker or w/c? {YES/NO:21197} Grab bars in the bathroom? {YES/NO:21197} Shower chair or bench in shower? {YES/NO:21197} Elevated toilet seat or a handicapped toilet? {YES/NO:21197}    Cognitive Function:        Immunizations Immunization History  Administered Date(s) Administered  . Fluad Quad(high Dose 65+) 09/23/2019  . Influenza Split 10/13/2011, 09/02/2012, 12/29/2013  . Influenza Whole 10/18/2007, 09/21/2009, 09/15/2010  . Influenza, High Dose Seasonal PF 09/21/2014, 08/18/2015, 10/09/2016, 08/31/2017, 09/10/2018, 08/23/2020  . Influenza-Unspecified 08/31/2017  . PFIZER SARS-COV-2 Vaccination 02/17/2020, 03/09/2020  . Pneumococcal Conjugate-13 07/27/2014  . Pneumococcal Polysaccharide-23 02/09/2016  . Tdap 08/18/2015  . Zoster 03/12/2014  . Zoster Recombinat (Shingrix) 10/16/2020    TDAP status: Up to date  Flu Vaccine status: Up to date  Pneumococcal vaccine status: Up to date  Covid-19 vaccine status: Completed vaccines  Qualifies for Shingles Vaccine? Yes   Zostavax completed Yes   Shingrix Completed?: Yes  Screening Tests Health Maintenance  Topic Date Due  . TETANUS/TDAP  08/17/2025  . INFLUENZA VACCINE  Completed  . COVID-19 Vaccine  Completed  . PNA vac Low Risk Adult  Completed    Health Maintenance  There are no preventive care reminders to display for this patient.  Colorectal cancer screening: No longer required.   Lung Cancer Screening: (Low Dose CT Chest recommended if Age 8-80 years, 30 pack-year currently smoking OR have quit w/in 15years.) does not qualify.   Lung Cancer Screening Referral: N/A   Additional Screening:  Hepatitis C Screening: does not qualify;   Vision Screening:  Recommended annual ophthalmology exams for early detection of glaucoma and other disorders of the eye. Is the patient up to date with their annual eye exam?  {YES/NO:21197} Who is the provider or what is the name of the office in which the patient attends annual eye exams? *** If pt is not established with a provider, would they like to be referred to a provider to establish care? {YES/NO:21197}.   Dental Screening: Recommended annual dental exams for proper oral hygiene  Community Resource Referral / Chronic Care Management: CRR required this visit?  {YES/NO:21197}  CCM required this visit?  {YES/NO:21197}     Plan:     I have personally reviewed and noted the following in the patient's chart:   . Medical and social history . Use of alcohol, tobacco or illicit drugs  . Current medications and supplements . Functional ability and status . Nutritional status . Physical activity . Advanced directives . List of other physicians . Hospitalizations, surgeries, and ER visits in previous 12 months . Vitals . Screenings to include cognitive, depression, and falls . Referrals and appointments  In addition, I  have reviewed and discussed with patient certain preventive protocols, quality metrics, and best practice recommendations. A written personalized care plan for preventive services as well as general preventive health recommendations were provided to patient.     Ofilia Neas, LPN   22/08/7988   Nurse Notes: ***

## 2020-11-30 ENCOUNTER — Ambulatory Visit: Payer: Medicare Other

## 2020-11-30 ENCOUNTER — Other Ambulatory Visit: Payer: Self-pay

## 2020-11-30 DIAGNOSIS — Z0000 Encounter for general adult medical examination without abnormal findings: Secondary | ICD-10-CM

## 2020-11-30 DIAGNOSIS — Z23 Encounter for immunization: Secondary | ICD-10-CM | POA: Diagnosis not present

## 2020-12-03 DIAGNOSIS — R29898 Other symptoms and signs involving the musculoskeletal system: Secondary | ICD-10-CM | POA: Diagnosis not present

## 2020-12-03 DIAGNOSIS — M5126 Other intervertebral disc displacement, lumbar region: Secondary | ICD-10-CM | POA: Diagnosis not present

## 2020-12-03 NOTE — Progress Notes (Signed)
Subjective:   [name] is a 79 y.o. male who presents for Medicare Annual/Subsequent preventive examination.  I connected with [name]  today by telephone and verified that I am speaking with the correct person using two identifiers. Location patient: home Location provider: work Persons participating in the virtual visit: patient, provider.   I discussed the limitations, risks, security and privacy concerns of performing an evaluation and management service by telephone and the availability of in person appointments. I also discussed with the patient that there may be a patient responsible charge related to this service. The patient expressed understanding and verbally consented to this telephonic visit.    Interactive audio and video telecommunications were attempted between this provider and patient, however failed, due to patient having technical difficulties OR patient did not have access to video capability.  We continued and completed visit with audio only.     Review of Systems    N/A  Cardiac Risk Factors include: advanced age (>47men, >72 women);hypertension;dyslipidemia     Objective:    Today's Vitals   12/06/20 1448  PainSc: 2    There is no height or weight on file to calculate BMI.  Advanced Directives 12/06/2020 09/11/2020 10/23/2019 04/17/2017 04/10/2017  Does Patient Have a Medical Advance Directive? No No No No No  Would patient like information on creating a medical advance directive? No - Patient declined - No - Patient declined - -    Current Medications (verified) Outpatient Encounter Medications as of 12/06/2020  Medication Sig  . acetaminophen (TYLENOL) 500 MG tablet Take 500 mg by mouth in the morning and at bedtime.  Marland Kitchen aspirin 81 MG tablet Take 81 mg by mouth daily.  . Cholecalciferol (VITAMIN D3) 125 MCG (5000 UT) CAPS Take 1 capsule (5,000 Units total) by mouth daily.  . clobetasol (OLUX) 0.05 % topical foam APPLY to back and leg  .  Coenzyme Q10 100 MG TABS Take 1 tablet by mouth daily.  . DHA-EPA-Vitamin E (OMEGA-3 COMPLEX PO) Take 1,200 mg by mouth 3 (three) times daily. ProOmega LDL  . magnesium oxide (MAG-OX) 400 MG tablet Take 400 mg daily by mouth.  . metoprolol tartrate (LOPRESSOR) 50 MG tablet TAKE 1/2 TABLET BY MOUTH IN THE MORNING AND 1 TABLET BY MOUTH IN THE EVENIG  . metroNIDAZOLE (METROCREAM) 0.75 % cream Apply to face  . MULTIPLE VITAMIN PO Take 1 tablet by mouth daily.  Marland Kitchen MYRBETRIQ 25 MG TB24 tablet Take 25 mg by mouth daily.  . Omega-3 Fatty Acids (FISH OIL ADULT GUMMIES PO) Take 1 tablet by mouth daily.  . RABEprazole (ACIPHEX) 20 MG tablet TAKE 1 TABLET BY MOUTH EVERY DAY  . Red Yeast Rice 600 MG CAPS Take 1 capsule by mouth daily.  . tamsulosin (FLOMAX) 0.4 MG CAPS capsule TAKE 1 CAPSULE BY MOUTH EVERY DAY  . nitroGLYCERIN (NITROSTAT) 0.4 MG SL tablet PLACE 1 TABLET UNDER TONGUE EVERY 5 MINUTES AS NEEDED FOR CHEST PAIN (Patient not taking: Reported on 12/06/2020)   Facility-Administered Encounter Medications as of 12/06/2020  Medication  . 0.9 %  sodium chloride infusion    Allergies (verified) Propoxyphene n-acetaminophen and Iodinated diagnostic agents   History: Past Medical History:  Diagnosis Date  . Abdominal pain, right lateral 02/21/2012   progressive.    . ALLERGIC RHINITIS 07/23/2007  . Allergy   . Arthritis   . Atrial fibrillation ([place]) 07/06/2009  . Atypical mole 09/13/1992   left shoulder Dr Cheryln Manly exc  . Atypical nevi  10/08/1992   right abdomen Dr Francisca December  . BACK PAIN 11/02/2009  . BACK PAIN, LUMBAR 10/25/2010  . Basal cell carcinoma 08/06/1992   right post thigh Dr Cheryln Manly  . BCC (basal cell carcinoma of skin) 04/23/2018   left post shoulder tx with bx  . BENIGN PROSTATIC HYPERTROPHY 07/25/2007  . 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 CAD 01/22/2009  . Cancer ([place])    skin  . 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  . Cataract   . COLONIC POLYPS, HX OF 10/18/2007  .  Cough 10/25/2010  . DIZZINESS 01/22/2009  . DYSPNEA ON EXERTION 08/21/2008  . ECHOCARDIOGRAM, ABNORMAL 08/19/2007  . GERD 07/25/2007  . Heart murmur   . HERNIA 07/06/2009  . HX, PERSONAL, MALIGNANCY, SKIN NEC 10/18/2007  . HYPERGLYCEMIA, BORDERLINE 12/31/2007  . HYPERLIPIDEMIA 07/25/2007  . HYPERTENSION 07/23/2007  . Inguinal hernia   . LUMBAR RADICULOPATHY, RIGHT 10/18/2007  . MUSCLE PAIN 01/22/2009  . Nausea   . NEURITIS 10/25/2010  . Palpitations 05/09/2010  . RHINITIS, CHRONIC 05/11/2010  . RUQ PAIN 01/22/2009  . Sinus polyp   . UNS ADVRS EFF OTH RX MEDICINAL&BIOLOGICAL SBSTNC 04/15/2008  . 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 VITAMIN D DEFICIENCY 11/02/2009  . Voice strain    Past Surgical History:  Procedure Laterality Date  . BACK SURGERY    . basal cell and melanoma removed     face  . BONY PELVIS SURGERY    . CATARACT EXTRACTION     BIL  . COLONOSCOPY    . CORONARY ARTERY BYPASS GRAFT  2009  . HERNIA REPAIR  7 times   inguinal  . LAPAROSCOPIC CHOLECYSTECTOMY  03/03/2013  . LUMBAR SPINE SURGERY    . POLYPECTOMY    . TONSILLECTOMY     Family History  Problem Relation Age of Onset  . Lung cancer Mother   . Cancer Mother   . Heart attack Father   . Hypertension Father   . Alcohol abuse Father   . Heart disease Father   . Breast cancer Sister   . Cancer Sister        breast  . Alcohol abuse Brother   . Hypertension Brother   . Alcohol abuse Brother   . Hypertension Brother   . Alcohol abuse Brother   . Hypertension Brother   . Kidney disease Sister   . Colon cancer Neg Hx   . Esophageal cancer Neg Hx   . Rectal cancer Neg Hx   . Stomach cancer Neg Hx    Social History   Socioeconomic History  . Marital status: Married    Spouse name: Not on file  . Number of children: Not on file  . Years of education: Not on file  . Highest education level: Not on file  Occupational History  . Not on file  Tobacco Use  . Smoking status: Former Smoker    Types: Cigarettes     Quit date: 04/02/1964    Years since quitting: 56.7  . Smokeless tobacco: Never Used  . Tobacco comment: in early 20's he quit  Vaping Use  . Vaping Use: Never used  Substance and Sexual Activity  . Alcohol use: Yes    Comment: socially  . Drug use: No  . Sexual activity: Not on file  Other Topics Concern  . Not on file  Social History Narrative   Retired [employer]   Married   Non smoker some etoh   Travels a lot  Social Determinants of Health   Financial Resource Strain: Low Risk   . Difficulty of Paying Living Expenses: Not hard at all  Food Insecurity: No Food Insecurity  . Worried About Charity fundraiser in the Last Year: Never true  . Ran Out of Food in the Last Year: Never true  Transportation Needs: No Transportation Needs  . Lack of Transportation (Medical): No  . Lack of Transportation (Non-Medical): No  Physical Activity: Inactive  . Days of Exercise per Week: 0 days  . Minutes of Exercise per Session: 0 min  Stress: No Stress Concern Present  . Feeling of Stress : Not at all  Social Connections: Moderately Integrated  . Frequency of Communication with Friends and Family: Once a week  . Frequency of Social Gatherings with Friends and Family: Twice a week  . Attends Religious Services: More than 4 times per year  . Active Member of Clubs or Organizations: No  . Attends Archivist Meetings: Never  . Marital Status: Married    Tobacco Counseling Counseling given: Not Answered Comment: in early 20's he quit   Clinical Intake:  Pre-visit preparation completed: Yes  Pain : 0-10 Pain Score: 2  Pain Type: Acute pain Pain Location:  (Diffused muscle aches) Pain Onset: In the past 7 days     Nutritional Risks: None Diabetes: No CBG done?: No Did pt. bring in CBG monitor from home?: No  How often do you need to have someone help you when you read instructions, pamphlets, or other written materials from your doctor or pharmacy?:  1 - Never What is the last grade level you completed in school?: Some College  Diabetic?No   Interpreter Needed?: No  Information entered by :: [facility] of Daily Living In your present state of health, do you have any difficulty performing the following activities: 12/06/2020  Hearing? N  Vision? N  Difficulty concentrating or making decisions? N  Walking or climbing stairs? Y  Dressing or bathing? N  Doing errands, shopping? N  Preparing Food and eating ? N  Using the Toilet? N  In the past six months, have you accidently leaked urine? Y  Comment has urinary incontinence with urgency  Do you have problems with loss of bowel control? N  Managing your Medications? N  Managing your Finances? N  Housekeeping or managing your Housekeeping? N  Some recent data might be hidden    Patient Care Team: Panosh, Standley Brooking, MD as PCP - General Josue Hector, MD (Cardiology) Rexene Alberts, MD (Thoracic Diseases) Milus Banister, MD as Attending Physician (Gastroenterology) Luna Kitchens., MD as Referring Physician (Neurosurgery) Viona Gilmore, [facility] as Pharmacist (Pharmacist)  Indicate any recent Medical Services you may have received from other than [facility] providers in the past year (date may be approximate).     Assessment:   This is a routine wellness examination for Lyric.  Hearing/Vision screen  Hearing Screening   125Hz  250Hz  500Hz  1000Hz  2000Hz  3000Hz  4000Hz  6000Hz  8000Hz   Right ear:           Left ear:           Vision Screening Comments: Patient states gets eyes checked once per year. Has had some vision changes   Dietary issues and exercise activities discussed: Current Exercise Habits: The patient does not participate in regular exercise at present, Exercise limited by: orthopedic condition(s);neurologic condition(s)  Goals    . Exercise 3x per week (30 min  per time)    . Pharmacy Care Plan     CARE PLAN ENTRY (see longitudinal plan of  care for additional care plan information)  Current Barriers:  . Chronic Disease Management support, education, and care coordination needs related to Hypertension, Hyperlipidemia, Atrial Fibrillation, Coronary Artery Disease, GERD, Overactive Bladder, BPH, and pain   Hypertension BP Readings from Last 3 Encounters:  09/27/20 112/68  09/14/20 130/78  09/11/20 (!) 171/91   . Pharmacist Clinical Goal(s): o Over the next 90 days, patient will work with PharmD and providers to maintain BP goal <130/80 . Current regimen:  o Metoprolol tartrate 50 mg 1/2 tablet in the morning and 1 tablet in the evening . Interventions: o Discussed DASH eating plan recommendations: . Emphasizes vegetables, fruits, and whole-grains . Includes fat-free or low-fat dairy products, fish, poultry, beans, nuts, and vegetable oils . Limits foods that are high in saturated fat. These foods include fatty meats, full-fat dairy products, and tropical oils such as coconut, palm kernel, and palm oils. . Limits sugar-sweetened beverages and sweets . Limiting sodium intake to < 1500 mg/day o Discussed recommendations for moderate aerobic exercise for 150 minutes/week spread out over 5 days for heart healthy lifestyle  . Patient self care activities - Over the next 90 days, patient will: o Check blood pressure weekly, document, and provide at future appointments o Ensure daily salt intake < 2300 mg/day  Hyperlipidemia Lab Results  Component Value Date/Time   LDLCALC 119 (H) 10/13/2019 03:23 PM   LDLDIRECT 187.2 03/31/2008 09:25 AM   . Pharmacist Clinical Goal(s): o Over the next 90 days, patient will work with PharmD and providers to achieve LDL goal < 70 . Current regimen:  . Omega 3 complex 1,200 mg one gummy daily . Red yeast rice 1 capsule daily . Interventions: o Discussed lowering cholesterol through diet by: Marland Kitchen Limiting foods with cholesterol such as liver and other organ meats, egg yolks, shrimp, and whole  milk dairy products . Avoiding saturated fats and trans fats and incorporating healthier fats, such as lean meat, nuts, and unsaturated oils like canola and olive oils . Eating foods with soluble fiber such as whole-grain cereals such as oatmeal and oat bran, fruits such as apples, bananas, oranges, pears, and prunes, legumes such as kidney beans, lentils, chick peas, black-eyed peas, and lima beans, and green leafy vegetables . Limiting alcohol intake  . Discussed risks of taking red yeast rice as there have been links to liver failure . Patient self care activities - Over the next 90 days, patient will: o Continue management with diet and lifestyle  Afib . Pharmacist Clinical Goal(s): o Over the next 90 days, patient will work with PharmD and providers to maintain heart rate between 60-100 beats per minute . Current regimen:  o Metoprolol tartrate 50 mg 1/2 tablet in the morning and 1 tablet in the evening . Interventions: o Discussed monitoring heart rate along with blood pressure . Patient self care activities - Over the next 90 days, patient will: o Check heart rate along with blood pressure and keep a log o Continue current medication  Coronary artery disease . Pharmacist Clinical Goal(s) o Over the next 90 days, patient will work with PharmD and providers to prevent heart events . Current regimen:  . Aspirin 81 mg 1 tablet daily . Nitroglycerin 0.4 mg SL tablet as needed . Interventions: o Discussed monitoring for signs of bleeding such as unexplained and excessive bleeding from a cut or injury, easy or  excessive bruising, blood in urine or stools, and nosebleeds without a known cause . Patient self care activities - Over the next 90 days, patient will: o Continue current medications  BPH . Pharmacist Clinical Goal(s) o Over the next 90 days, patient will work with PharmD and providers to manage symptoms of BPH . Current regimen:  o Tamsulosin 0.4 mg 1 capsule  daily . Interventions: o We discussed consistently taking tamsulosin 30 minutes to an hour after eating to help with absorption . Patient self care activities - Over the next 90 days, patient will: o Continue current medications  Overactive bladder . Pharmacist Clinical Goal(s) o Over the next 90 days, patient will work with PharmD and providers to manage symptoms of overactive bladder . Current regimen:  o Myrbetriq 25 mg 1 tablet daily . Patient self care activities - Over the next 90 days, patient will: o Continue current medications  GERD . Pharmacist Clinical Goal(s) o Over the next 30 days, patient will work with PharmD and providers to manage symptoms of heartburn/indigestion . Current regimen:  . Rabeprazole 20 mg 1 tablet daily . Interventions: o We discussed risks of long term use of rabeprazole o Discussed on-pharmacologic management of symptoms such as elevating the head of your bed, avoiding eating 2-3 hours before bed, avoiding triggering foods such as acidic, spicy, or fatty foods, eating smaller meals, and wearing clothes that are loose around the waist o Plan to taper medication . Patient self care activities - Over the next 30 days, patient will: o Decrease to 20 mg every other day for 2 weeks and supplement with Tums as needed for heartburn symptoms and then stop taking  Pain . Pharmacist Clinical Goal(s) o Over the next 90 days, patient will work with PharmD and providers to manage pain . Current regimen:   Tylenol 500 mg 1 tablet in AM and 1 tablet in PM . Ibuprofen as needed . Interventions: o Discussed preference for Tylenol over Advil (ibuprofen) and Aleve as you are currently taking aspirin and do not want to increase bleed risk . Patient self care activities - Over the next 90 days, patient will: o Continue current medications  Medication management . Pharmacist Clinical Goal(s): o Over the next 90 days, patient will work with PharmD and providers to  maintain optimal medication adherence . Current pharmacy: [facility] . Interventions o Comprehensive medication review performed. o Continue current medication management strategy . Patient self care activities - Over the next 90 days, patient will: o Take medications as prescribed o Report any questions or concerns to PharmD and/or provider(s)  Initial goal documentation       Depression Screen PHQ 2/9 Scores 12/06/2020 04/09/2019 02/20/2017 02/09/2016 07/27/2014  PHQ - 2 Score 0 0 0 0 0  PHQ- 9 Score 0 - - - -    Fall Risk Fall Risk  12/06/2020 04/09/2019 02/20/2017 02/09/2016 07/27/2014  Falls in the past year? 0 0 No No No  Number falls in past yr: 0 - - - -  Injury with Fall? 0 - - - -  Risk for fall due to : Impaired balance/gait;Impaired mobility - - - -  Follow up Falls evaluation completed;Falls prevention discussed - - - -    FALL RISK PREVENTION PERTAINING TO THE HOME:  Any stairs in or around the home? No  If so, are there any without handrails? No  Home free of loose throw rugs in walkways, pet beds, electrical cords, etc? Yes  Adequate lighting in  your home to reduce risk of falls? Yes   ASSISTIVE DEVICES UTILIZED TO PREVENT FALLS:  Life alert? No  Use of a cane, walker or w/c? Yes  Grab bars in the bathroom? Yes  Shower chair or bench in shower? Yes  Elevated toilet seat or a handicapped toilet? Yes     Cognitive Function:   Normal cognitive status assessed by direct observation by this Nurse Health Advisor. No abnormalities found.        Immunizations Immunization History  Administered Date(s) Administered  . Fluad Quad(high Dose 65+) 09/23/2019  . Influenza Split 10/13/2011, 09/02/2012, 12/29/2013  . Influenza Whole 10/18/2007, 09/21/2009, 09/15/2010  . Influenza, High Dose Seasonal PF 09/21/2014, 08/18/2015, 10/09/2016, 08/31/2017, 09/10/2018, 08/23/2020  . Influenza-Unspecified 08/31/2017  . PFIZER SARS-COV-2 Vaccination 02/17/2020, 03/09/2020  .  Pneumococcal Conjugate-13 07/27/2014  . Pneumococcal Polysaccharide-23 02/09/2016  . Tdap 08/18/2015  . Zoster 03/12/2014  . Zoster Recombinat (Shingrix) 10/16/2020    TDAP status: Up to date  Flu Vaccine status: Up to date  Pneumococcal vaccine status: Up to date  Covid-19 vaccine status: Completed vaccines  Qualifies for Shingles Vaccine? Yes   Zostavax completed Yes   Shingrix Completed?: Yes  Screening Tests Health Maintenance  Topic Date Due  . COVID-19 Vaccine (3 - Pfizer risk 4-dose series) 04/06/2020  . TETANUS/TDAP  08/17/2025  . INFLUENZA VACCINE  Completed  . PNA vac Low Risk Adult  Completed    Health Maintenance  Health Maintenance Due  Topic Date Due  . COVID-19 Vaccine (3 - Pfizer risk 4-dose series) 04/06/2020    Colorectal cancer screening: Type of screening: Colonoscopy. Completed 04/17/2017. Repeat every 3 years  Lung Cancer Screening: (Low Dose CT Chest recommended if Age 90-80 years, 30 pack-year currently smoking OR have quit w/in 15years.) does not qualify.   Lung Cancer Screening Referral: N/A   Additional Screening:  Hepatitis C Screening: does not qualify;   Vision Screening: Recommended annual ophthalmology exams for early detection of glaucoma and other disorders of the eye. Is the patient up to date with their annual eye exam?  Yes  Who is the provider or what is the name of the office in which the patient attends annual eye exams? Dr. Eulas Post  If pt is not established with a provider, would they like to be referred to a provider to establish care? No .   Dental Screening: Recommended annual dental exams for proper oral hygiene  Community Resource Referral / Chronic Care Management: CRR required this visit?  No   CCM required this visit?  No      Plan:     I have personally reviewed and noted the following in the patient's chart:   . Medical and social history . Use of alcohol, tobacco or illicit drugs  . Current medications  and supplements . Functional ability and status . Nutritional status . Physical activity . Advanced directives . List of other physicians . Hospitalizations, surgeries, and ER visits in previous 12 months . Vitals . Screenings to include cognitive, depression, and falls . Referrals and appointments  In addition, I have reviewed and discussed with patient certain preventive protocols, quality metrics, and best practice recommendations. A written personalized care plan for preventive services as well as general preventive health recommendations were provided to patient.     Ofilia Neas, LPN   [id number]   Nurse Notes: None

## 2020-12-06 ENCOUNTER — Other Ambulatory Visit: Payer: Self-pay

## 2020-12-06 ENCOUNTER — Ambulatory Visit (INDEPENDENT_AMBULATORY_CARE_PROVIDER_SITE_OTHER): Payer: Medicare Other

## 2020-12-06 DIAGNOSIS — Z0000 Encounter for general adult medical examination without abnormal findings: Secondary | ICD-10-CM | POA: Diagnosis not present

## 2020-12-06 NOTE — Patient Instructions (Signed)
Mr. Isenhower , Thank you for taking time to come for your Medicare Wellness Visit. I appreciate your ongoing commitment to your health goals. Please review the following plan we discussed and let me know if I can assist you in the future.   Screening recommendations/referrals: Colonoscopy: Currently due as of 04/17/2020 please let us know if  You would like for [place] to get you set up for this  Recommended yearly ophthalmology/optometry visit for glaucoma screening and checkup Recommended yearly dental visit for hygiene and checkup  Vaccinations: Influenza vaccine: Up to date, next due fall 2022  Pneumococcal vaccine: Completed series  Tdap vaccine: Up to date, next due 08/17/2025 Shingles vaccine: Completed series     Advanced directives: Advance directive discussed with you today. Even though you declined this today please call our office should you change your mind and we can give you the proper paperwork for you to fill out.   Conditions/risks identified: None  Next appointment: 01/14/2021 @ 10:00 am with the Pharmacist   Preventive Care 81 Years and Older, Male Preventive care refers to lifestyle choices and visits with your health care provider that can promote health and wellness. What does preventive care include?  A yearly physical exam. This is also called an annual well check.  Dental exams once or twice a year.  Routine eye exams. Ask your health care provider how often you should have your eyes checked.  Personal lifestyle choices, including:  Daily care of your teeth and gums.  Regular physical activity.  Eating a healthy diet.  Avoiding tobacco and drug use.  Limiting alcohol use.  Practicing safe sex.  Taking low doses of aspirin every day.  Taking vitamin and mineral supplements as recommended by your health care provider. What happens during an annual well check? The services and screenings done by your health care provider during your annual well check  will depend on your age, overall health, lifestyle risk factors, and family history of disease. Counseling  Your health care provider may ask you questions about your:  Alcohol use.  Tobacco use.  Drug use.  Emotional well-being.  Home and relationship well-being.  Sexual activity.  Eating habits.  History of falls.  Memory and ability to understand (cognition).  Work and work Statistician. Screening  You may have the following tests or measurements:  Height, weight, and BMI.  Blood pressure.  Lipid and cholesterol levels. These may be checked every 5 years, or more frequently if you are over 35 years old.  Skin check.  Lung cancer screening. You may have this screening every year starting at age 25 if you have a 30-pack-year history of smoking and currently smoke or have quit within the past 15 years.  Fecal occult blood test (FOBT) of the stool. You may have this test every year starting at age 36.  Flexible sigmoidoscopy or colonoscopy. You may have a sigmoidoscopy every 5 years or a colonoscopy every 10 years starting at age 45.  Prostate cancer screening. Recommendations will vary depending on your family history and other risks.  Hepatitis C blood test.  Hepatitis B blood test.  Sexually transmitted disease (STD) testing.  Diabetes screening. This is done by checking your blood sugar (glucose) after you have not eaten for a while (fasting). You may have this done every 1-3 years.  Abdominal aortic aneurysm (AAA) screening. You may need this if you are a current or former smoker.  Osteoporosis. You may be screened starting at age 78 if you  are at high risk. Talk with your health care provider about your test results, treatment options, and if necessary, the need for more tests. Vaccines  Your health care provider may recommend certain vaccines, such as:  Influenza vaccine. This is recommended every year.  Tetanus, diphtheria, and acellular pertussis  (Tdap, Td) vaccine. You may need a Td booster every 10 years.  Zoster vaccine. You may need this after age 70.  Pneumococcal 13-valent conjugate (PCV13) vaccine. One dose is recommended after age 68.  Pneumococcal polysaccharide (PPSV23) vaccine. One dose is recommended after age 27. Talk to your health care provider about which screenings and vaccines you need and how often you need them. This information is not intended to replace advice given to you by your health care provider. Make sure you discuss any questions you have with your health care provider. Document Released: 01/07/2016 Document Revised: 08/30/2016 Document Reviewed: 10/12/2015 Elsevier Interactive Patient Education  2017 [employer] Prevention in the Home Falls can cause injuries. They can happen to people of all ages. There are many things you can do to make your home safe and to help prevent falls. What can I do on the outside of my home?  Regularly fix the edges of walkways and driveways and fix any cracks.  Remove anything that might make you trip as you walk through a door, such as a raised step or threshold.  Trim any bushes or trees on the path to your home.  Use bright outdoor lighting.  Clear any walking paths of anything that might make someone trip, such as rocks or tools.  Regularly check to see if handrails are loose or broken. Make sure that both sides of any steps have handrails.  Any raised decks and porches should have guardrails on the edges.  Have any leaves, snow, or ice cleared regularly.  Use sand or salt on walking paths during winter.  Clean up any spills in your garage right away. This includes oil or grease spills. What can I do in the bathroom?  Use night lights.  Install grab bars by the toilet and in the tub and shower. Do not use towel bars as grab bars.  Use non-skid mats or decals in the tub or shower.  If you need to sit down in the shower, use a plastic, non-slip  stool.  Keep the floor dry. Clean up any water that spills on the floor as soon as it happens.  Remove soap buildup in the tub or shower regularly.  Attach bath mats securely with double-sided non-slip rug tape.  Do not have throw rugs and other things on the floor that can make you trip. What can I do in the bedroom?  Use night lights.  Make sure that you have a light by your bed that is easy to reach.  Do not use any sheets or blankets that are too big for your bed. They should not hang down onto the floor.  Have a firm chair that has side arms. You can use this for support while you get dressed.  Do not have throw rugs and other things on the floor that can make you trip. What can I do in the kitchen?  Clean up any spills right away.  Avoid walking on wet floors.  Keep items that you use a lot in easy-to-reach places.  If you need to reach something above you, use a strong step stool that has a grab bar.  Keep electrical cords out  of the way.  Do not use floor polish or wax that makes floors slippery. If you must use wax, use non-skid floor wax.  Do not have throw rugs and other things on the floor that can make you trip. What can I do with my stairs?  Do not leave any items on the stairs.  Make sure that there are handrails on both sides of the stairs and use them. Fix handrails that are broken or loose. Make sure that handrails are as long as the stairways.  Check any carpeting to make sure that it is firmly attached to the stairs. Fix any carpet that is loose or worn.  Avoid having throw rugs at the top or bottom of the stairs. If you do have throw rugs, attach them to the floor with carpet tape.  Make sure that you have a light switch at the top of the stairs and the bottom of the stairs. If you do not have them, ask someone to add them for you. What else can I do to help prevent falls?  Wear shoes that:  Do not have high heels.  Have rubber bottoms.  Are  comfortable and fit you well.  Are closed at the toe. Do not wear sandals.  If you use a stepladder:  Make sure that it is fully opened. Do not climb a closed stepladder.  Make sure that both sides of the stepladder are locked into place.  Ask someone to hold it for you, if possible.  Clearly mark and make sure that you can see:  Any grab bars or handrails.  First and last steps.  Where the edge of each step is.  Use tools that help you move around (mobility aids) if they are needed. These include:  Canes.  Walkers.  Scooters.  Crutches.  Turn on the lights when you go into a dark area. Replace any light bulbs as soon as they burn out.  Set up your furniture so you have a clear path. Avoid moving your furniture around.  If any of your floors are uneven, fix them.  If there are any pets around you, be aware of where they are.  Review your medicines with your doctor. Some medicines can make you feel dizzy. This can increase your chance of falling. Ask your doctor what other things that you can do to help prevent falls. This information is not intended to replace advice given to you by your health care provider. Make sure you discuss any questions you have with your health care provider. Document Released: 10/07/2009 Document Revised: 05/18/2016 Document Reviewed: 01/15/2015 Elsevier Interactive Patient Education  2017 [employer].

## 2020-12-07 DIAGNOSIS — R29898 Other symptoms and signs involving the musculoskeletal system: Secondary | ICD-10-CM | POA: Diagnosis not present

## 2020-12-07 DIAGNOSIS — M5126 Other intervertebral disc displacement, lumbar region: Secondary | ICD-10-CM | POA: Diagnosis not present

## 2020-12-09 DIAGNOSIS — M5126 Other intervertebral disc displacement, lumbar region: Secondary | ICD-10-CM | POA: Diagnosis not present

## 2020-12-09 DIAGNOSIS — R29898 Other symptoms and signs involving the musculoskeletal system: Secondary | ICD-10-CM | POA: Diagnosis not present

## 2020-12-14 DIAGNOSIS — M5126 Other intervertebral disc displacement, lumbar region: Secondary | ICD-10-CM | POA: Diagnosis not present

## 2020-12-14 DIAGNOSIS — R29898 Other symptoms and signs involving the musculoskeletal system: Secondary | ICD-10-CM | POA: Diagnosis not present

## 2020-12-21 DIAGNOSIS — M5126 Other intervertebral disc displacement, lumbar region: Secondary | ICD-10-CM | POA: Diagnosis not present

## 2020-12-21 DIAGNOSIS — R29898 Other symptoms and signs involving the musculoskeletal system: Secondary | ICD-10-CM | POA: Diagnosis not present

## 2020-12-23 DIAGNOSIS — M5126 Other intervertebral disc displacement, lumbar region: Secondary | ICD-10-CM | POA: Diagnosis not present

## 2020-12-23 DIAGNOSIS — R29898 Other symptoms and signs involving the musculoskeletal system: Secondary | ICD-10-CM | POA: Diagnosis not present

## 2020-12-24 ENCOUNTER — Other Ambulatory Visit: Payer: Self-pay | Admitting: Internal Medicine

## 2020-12-29 DIAGNOSIS — R29898 Other symptoms and signs involving the musculoskeletal system: Secondary | ICD-10-CM | POA: Diagnosis not present

## 2020-12-29 DIAGNOSIS — M5126 Other intervertebral disc displacement, lumbar region: Secondary | ICD-10-CM | POA: Diagnosis not present

## 2020-12-29 NOTE — Progress Notes (Deleted)
CARDIOLOGY OFFICE NOTE  Date:  12/29/2020    [name] Date of Birth: [birth year] Medical Record [id number]  PCP:  [name], MD  Cardiologist:  Servando Snare & ***    No chief complaint on file.   History of Present Illness: [name] is a 80 y.o. male who presents today for a ***   Seen for Dr. Johnsie Cancel. Has primarily followed with me.    He has a history of known CAD with prior CABG in 2009. Other issues include HLD, BPH and HTN. He had repeat cath back in 2013 with patent grafts noted. Lesion in a small unbypassed OM to be managed medically.  He has had muscle aches/pains/weakness with statin therapy. He has been on altered doses of Lipitor in the past with poor tolerance reported.    I have followed him over the past several years. He has had some increase in palpitations with cautious increase in his beta blocker given his 1st degree AV block. He has had chronic back pain. He is not active. More limited by his back. Has not tolerated statins or Zetia in the past. Balance has gotten poor.    When seen by me in January - trying to get vaccinated. He underwent back surgery at [facility] in June - with Dr. Owens Shark at [facility] - needed some medicine adjustments after he got back home due to concerns for BP/HR. Was on lower dose of ARB. Last seen by me in July - seemed to be doing ok. Getting stronger. Less pain. Gets way too much salt - they eat lots of takeout/delivery due to necessity.    Comes in today. Here alone. He has had recent ER visit for constipation. This is better. Seems to be doing ok. Rare twinge of chest pain - he is not sure what that is - it is very fleeting - not exertional. Breathing is ok - better without a mask. He is not really active as a general rule. Not getting any PT. His back surgery has been limited by some nerve pain and weak legs. No falls noted. Balance is poor. He is not really using the muscle relaxers. He is taking some vitamin K apparently  to try and help his arthritis. BP has been ok off ARB. Swelling seems to have improved. Still with probably too much salt. Overall, he feels like he is ok and holding his own.   Comes in today. Here with   Past Medical History:  Diagnosis Date  . Abdominal pain, right lateral 02/21/2012   progressive.    . ALLERGIC RHINITIS 07/23/2007  . Allergy   . Arthritis   . Atrial fibrillation ([place]) 07/06/2009  . Atypical mole 09/13/1992   left shoulder Dr Cheryln Manly exc  . Atypical nevi 10/08/1992   right abdomen Dr Francisca December  . BACK PAIN 11/02/2009  . BACK PAIN, LUMBAR 10/25/2010  . Basal cell carcinoma 08/06/1992   right post thigh Dr Cheryln Manly  . BCC (basal cell carcinoma of skin) 04/23/2018   left post shoulder tx with bx  . BENIGN PROSTATIC HYPERTROPHY 07/25/2007  . 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 CAD 01/22/2009  . Cancer ([place])    skin  . 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  . Cataract   . COLONIC POLYPS, HX OF 10/18/2007  . Cough 10/25/2010  . DIZZINESS 01/22/2009  . DYSPNEA ON EXERTION 08/21/2008  . ECHOCARDIOGRAM, ABNORMAL 08/19/2007  . GERD 07/25/2007  . Heart murmur   . HERNIA 07/06/2009  .  HX, PERSONAL, MALIGNANCY, SKIN NEC 10/18/2007  . HYPERGLYCEMIA, BORDERLINE 12/31/2007  . HYPERLIPIDEMIA 07/25/2007  . HYPERTENSION 07/23/2007  . Inguinal hernia   . LUMBAR RADICULOPATHY, RIGHT 10/18/2007  . MUSCLE PAIN 01/22/2009  . Nausea   . NEURITIS 10/25/2010  . Palpitations 05/09/2010  . RHINITIS, CHRONIC 05/11/2010  . RUQ PAIN 01/22/2009  . Sinus polyp   . UNS ADVRS EFF OTH RX MEDICINAL&BIOLOGICAL SBSTNC 04/15/2008  . 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 VITAMIN D DEFICIENCY 11/02/2009  . Voice strain     Past Surgical History:  Procedure Laterality Date  . BACK SURGERY    . basal cell and melanoma removed     face  . BONY PELVIS SURGERY    . CATARACT EXTRACTION     BIL  . COLONOSCOPY    . CORONARY ARTERY BYPASS GRAFT  2009  . HERNIA REPAIR  7 times   inguinal  . LAPAROSCOPIC  CHOLECYSTECTOMY  03/03/2013  . LUMBAR SPINE SURGERY    . POLYPECTOMY    . TONSILLECTOMY       Medications: No outpatient medications have been marked as taking for the 01/05/21 encounter (Appointment) with Rosalio Macadamia, NP.   Current Facility-Administered Medications for the 01/05/21 encounter (Appointment) with Rosalio Macadamia, NP  Medication  . 0.9 %  sodium chloride infusion     Allergies: Allergies  Allergen Reactions  . Propoxyphene N-Acetaminophen Nausea And Vomiting  . Iodinated Diagnostic Agents Swelling    Pt developed slight lt upper lip swelling only to one side about 15 minutes post IV contrast injection. No other symptoms.     Social History: The patient  reports that he quit smoking about 56 years ago. His smoking use included cigarettes. He has never used smokeless tobacco. He reports current alcohol use. He reports that he does not use drugs.   Family History: The patient's ***family history includes Alcohol abuse in his brother, brother, brother, and father; Breast cancer in his sister; Cancer in his mother and sister; Heart attack in his father; Heart disease in his father; Hypertension in his brother, brother, brother, and father; Kidney disease in his sister; Lung cancer in his mother.   Review of Systems: Please see the history of present illness.   All other systems are reviewed and negative.   Physical Exam: VS:  There were no vitals taken for this visit. Marland Kitchen  BMI There is no height or weight on file to calculate BMI.  Wt Readings from Last 3 Encounters:  09/27/20 202 lb (91.6 kg)  09/16/20 201 lb (91.2 kg)  09/14/20 201 lb (91.2 kg)    General: Pleasant. Well developed, well nourished and in no acute distress.   HEENT: Normal.  Neck: Supple, no JVD, carotid bruits, or masses noted.  Cardiac: ***Regular rate and rhythm. No murmurs, rubs, or gallops. No edema.  Respiratory:  Lungs are clear to auscultation bilaterally with normal work of breathing.   GI: Soft and nontender.  MS: No deformity or atrophy. Gait and ROM intact.  Skin: Warm and dry. Color is normal.  Neuro:  Strength and sensation are intact and no gross focal deficits noted.  Psych: Alert, appropriate and with normal affect.   LABORATORY DATA:  EKG:  EKG {ACTION; IS/IS [id number]:[id number]} ordered today.  Personally reviewed by me. This demonstrates ***.  Lab Results  Component Value Date   WBC 7.5 07/14/2020   HGB 15.1 07/14/2020   HCT 43.6 07/14/2020   PLT 271 07/14/2020   GLUCOSE 163 (H)  07/14/2020   CHOL 180 10/13/2019   TRIG 126 10/13/2019   HDL 38 (L) 10/13/2019   LDLDIRECT 187.2 03/31/2008   LDLCALC 119 (H) 10/13/2019   ALT 19 10/13/2019   AST 13 10/13/2019   NA 141 07/14/2020   K 4.2 07/14/2020   CL 104 07/14/2020   CREATININE 0.73 (L) 07/14/2020   BUN 15 07/14/2020   CO2 22 07/14/2020   TSH 1.640 10/13/2019   PSA 1.49 02/21/2019   INR 1.1 (H) 01/25/2012   HGBA1C 5.8 02/20/2017     BNP (last 3 results) No results for input(s): BNP in the last 8760 hours.  ProBNP (last 3 results) No results for input(s): PROBNP in the last 8760 hours.   Other Studies Reviewed Today:   Assessment/Plan:   ECHO IMPRESSIONS 09/2019    1. Left ventricular ejection fraction, by visual estimation, is 65 to 70%. The left ventricle has normal function. Normal left ventricular size. There is mildly increased left ventricular hypertrophy.  2. Left ventricular diastolic Doppler parameters are consistent with impaired relaxation pattern of LV diastolic filling.  3. Global right ventricle has normal systolic function.The right ventricular size is mildly enlarged. No increase in right ventricular wall thickness.  4. Left atrial size was mildly dilated.  5. Right atrial size was normal.  6. Mild aortic valve annular calcification.  7. The mitral valve is grossly normal. Trace mitral valve regurgitation. No evidence of mitral stenosis.  8. The tricuspid valve is normal in  structure. Tricuspid valve regurgitation was not visualized by color flow Doppler.  9. The aortic valve is normal in structure. Aortic valve regurgitation was not visualized by color flow Doppler. Mild aortic valve sclerosis without stenosis. 10. There is Mild calcification of the aortic valve. 11. There is Mild thickening of the aortic valve. 12. The pulmonic valve was normal in structure. Pulmonic valve regurgitation is trivial by color flow Doppler. 13. The inferior vena cava is normal in size with greater than 50% respiratory variability, suggesting right atrial pressure of 3 mmHg.   In comparison to the previous echocardiogram(s): 11/13/17 EF 60-65%.       Holter Study Highlights 10/2017   NSR PAC;s / PVC;s No significant arrhythmias PVC;s account for less than 1% total beats      Echo Study Conclusions 10/2017   - Left ventricle: The cavity size was normal. There was moderate   concentric hypertrophy. Systolic function was normal. The   estimated ejection fraction was in the range of 60% to 65%. Wall   motion was normal; there were no regional wall motion   abnormalities. Doppler parameters are consistent with abnormal   left ventricular relaxation (grade 1 diastolic dysfunction).   Doppler parameters are consistent with elevated ventricular   end-diastolic filling pressure. - Aortic valve: Trileaflet; normal thickness leaflets. There was no   regurgitation. - Aortic root: The aortic root was normal in size. - Mitral valve: There was mild regurgitation. - Left atrium: The atrium was moderately dilated. - Right ventricle: Systolic function was normal. - Right atrium: The atrium was normal in size. - Tricuspid valve: There was trivial regurgitation. - Pulmonary arteries: Systolic pressure was within the normal   range. - Inferior vena cava: The vessel was normal in size. The   respirophasic diameter changes were in the normal range (= 50%),   consistent with normal  central venous pressure. - Pericardium, extracardiac: There was no pericardial effusion.       ASSESSMENT & PLAN:  1. CAD - remote CABG - he has no exertional symptoms - would favor conservative management.    2. HTN - BP looks fine - will stay on current regimen - remains off his ARB.    3. HLD - does not tolerate therapy and does not wish to try anything else.    4. Spinal stenosis - prior back surgery - not sure how much this has really helped him.    5. 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 Would follow.    6. Aortic sclerosis  Current medicines are reviewed with the patient today.  The patient does not have concerns regarding medicines other than what has been noted above.  The following changes have been made:  See above.  Labs/ tests ordered today include:   No orders of the defined types were placed in this encounter.    Disposition:   FU with *** in {gen number [id number] {Days to years:10300}.   Patient is agreeable to this plan and will call if any problems develop in the interim.   SignedTruitt Merle, NP  12/29/2020 9:30 AM  [facility] [address] [place], [place]  [address] Phone: [phone] Fax: [phone]

## 2020-12-31 DIAGNOSIS — R29898 Other symptoms and signs involving the musculoskeletal system: Secondary | ICD-10-CM | POA: Diagnosis not present

## 2020-12-31 DIAGNOSIS — M5126 Other intervertebral disc displacement, lumbar region: Secondary | ICD-10-CM | POA: Diagnosis not present

## 2021-01-03 DIAGNOSIS — Z6831 Body mass index (BMI) 31.0-31.9, adult: Secondary | ICD-10-CM | POA: Diagnosis not present

## 2021-01-03 DIAGNOSIS — Z7982 Long term (current) use of aspirin: Secondary | ICD-10-CM | POA: Diagnosis not present

## 2021-01-03 DIAGNOSIS — Z951 Presence of aortocoronary bypass graft: Secondary | ICD-10-CM | POA: Diagnosis not present

## 2021-01-03 DIAGNOSIS — Z9049 Acquired absence of other specified parts of digestive tract: Secondary | ICD-10-CM | POA: Diagnosis not present

## 2021-01-03 DIAGNOSIS — M48061 Spinal stenosis, lumbar region without neurogenic claudication: Secondary | ICD-10-CM | POA: Diagnosis not present

## 2021-01-03 DIAGNOSIS — I10 Essential (primary) hypertension: Secondary | ICD-10-CM | POA: Diagnosis not present

## 2021-01-03 DIAGNOSIS — E785 Hyperlipidemia, unspecified: Secondary | ICD-10-CM | POA: Diagnosis present

## 2021-01-03 DIAGNOSIS — K219 Gastro-esophageal reflux disease without esophagitis: Secondary | ICD-10-CM | POA: Diagnosis not present

## 2021-01-03 DIAGNOSIS — M4807 Spinal stenosis, lumbosacral region: Secondary | ICD-10-CM | POA: Diagnosis not present

## 2021-01-03 DIAGNOSIS — Z955 Presence of coronary angioplasty implant and graft: Secondary | ICD-10-CM | POA: Diagnosis not present

## 2021-01-03 DIAGNOSIS — Z79899 Other long term (current) drug therapy: Secondary | ICD-10-CM | POA: Diagnosis not present

## 2021-01-03 DIAGNOSIS — Z886 Allergy status to analgesic agent status: Secondary | ICD-10-CM | POA: Diagnosis not present

## 2021-01-03 DIAGNOSIS — Z85820 Personal history of malignant melanoma of skin: Secondary | ICD-10-CM | POA: Diagnosis not present

## 2021-01-03 DIAGNOSIS — E669 Obesity, unspecified: Secondary | ICD-10-CM | POA: Diagnosis not present

## 2021-01-03 DIAGNOSIS — I2510 Atherosclerotic heart disease of native coronary artery without angina pectoris: Secondary | ICD-10-CM | POA: Diagnosis not present

## 2021-01-03 DIAGNOSIS — Z91041 Radiographic dye allergy status: Secondary | ICD-10-CM | POA: Diagnosis not present

## 2021-01-03 DIAGNOSIS — M5126 Other intervertebral disc displacement, lumbar region: Secondary | ICD-10-CM | POA: Diagnosis not present

## 2021-01-03 DIAGNOSIS — G834 Cauda equina syndrome: Secondary | ICD-10-CM | POA: Diagnosis not present

## 2021-01-05 ENCOUNTER — Ambulatory Visit: Payer: Medicare Other | Admitting: Nurse Practitioner

## 2021-01-07 DIAGNOSIS — M79604 Pain in right leg: Secondary | ICD-10-CM | POA: Diagnosis not present

## 2021-01-07 DIAGNOSIS — E785 Hyperlipidemia, unspecified: Secondary | ICD-10-CM | POA: Diagnosis not present

## 2021-01-07 DIAGNOSIS — K219 Gastro-esophageal reflux disease without esophagitis: Secondary | ICD-10-CM | POA: Diagnosis not present

## 2021-01-07 DIAGNOSIS — N400 Enlarged prostate without lower urinary tract symptoms: Secondary | ICD-10-CM | POA: Diagnosis not present

## 2021-01-07 DIAGNOSIS — M48062 Spinal stenosis, lumbar region with neurogenic claudication: Secondary | ICD-10-CM | POA: Diagnosis not present

## 2021-01-07 DIAGNOSIS — G834 Cauda equina syndrome: Secondary | ICD-10-CM | POA: Diagnosis not present

## 2021-01-07 DIAGNOSIS — R531 Weakness: Secondary | ICD-10-CM | POA: Diagnosis not present

## 2021-01-07 DIAGNOSIS — Z7952 Long term (current) use of systemic steroids: Secondary | ICD-10-CM | POA: Diagnosis not present

## 2021-01-07 DIAGNOSIS — I10 Essential (primary) hypertension: Secondary | ICD-10-CM | POA: Diagnosis not present

## 2021-01-07 DIAGNOSIS — M5106 Intervertebral disc disorders with myelopathy, lumbar region: Secondary | ICD-10-CM | POA: Diagnosis not present

## 2021-01-07 DIAGNOSIS — Z4789 Encounter for other orthopedic aftercare: Secondary | ICD-10-CM | POA: Diagnosis not present

## 2021-01-07 DIAGNOSIS — R2689 Other abnormalities of gait and mobility: Secondary | ICD-10-CM | POA: Diagnosis not present

## 2021-01-14 ENCOUNTER — Telehealth: Payer: Medicare Other

## 2021-01-14 NOTE — Chronic Care Management (AMB) (Deleted)
Chronic Care Management Pharmacy  Name: ZYLER HYSON  MRN: [id number] DOB: 01-09-1941  Initial Planning Appointment: completed 10/11/20  Initial Questions: 1. Have you seen any other providers since your last visit? n/a 2. Any changes in your medicines or health? No   Chief Complaint/ HPI  [name],  80 y.o. , male presents for their Follow-Up CCM visit with the clinical pharmacist via telephone due to COVID-19 Pandemic.  PCP : [name], MD  Their chronic conditions include: HTN, Afib. HLD, CAD, GERD, BPH, overactive bladder, rosacea, pain  Office Visits: -12/06/20 Ofilia Neas, LPN: Patient presented for medicare annual wellness visit.  -09/27/20 Shanon Ace MD: Patient presented with increasing weakness or instability of right lower extremity with cramping in his anterior thigh. Recommended follow up with Dr. Owens Shark (brain and spine surgery specialist).  -09/16/20 Shanon Ace, MD: Patient presented for a video visit for hospitalization follow up. Patient was seen for lower abdomen pain and constipation. Patient has been drinking fluids and using Miralax daily for constipation.  -08/23/20 Shanon Ace, MD: Patient presented for back pain with right-sided sciatica evaluation. Patient tried tizanidine per surgery and did not help and baclofen helped more. Visit with Dr. Owens Shark on 8/3 and was released from care.  -05/06/20 Shanon Ace, MD: Patient presented for video visit for increased back pain. Pain is worsening and accompanied by lower muscle spasms. Prescribed baclofen.   Consult Visit: -01/03/21 Patient admitted for lumbar herniated disc surgery.  -11/16/20 Alfred Levins, MD (brain and spine surgery): Patient presented for follow up for right leg weakness. Unable to access notes.  -10/12/20 Alfred Levins, MD (brain and spine surgery): Patient presented for follow up for right leg weakness. Unable to access notes.  -09/14/20 Truitt Merle, NP (cardiology):  Patient presented for ischemic heart disease, HTN, and heart murmur follow up. Patient is not active and has a rare twinge of chest pain. Balance is poor. BP has been ok off the ARB. Follow up in 4 months.  -09/11/20 Patient presented to the ED with abdominal pain and constipation. Patient has not been able to pass stool for 12 hours.  -08/26/20 Alfred Levins, MD (brain and spine surgery): Patient presented for follow up for MRI. Unable to access notes.  -08/06/20 Robyne Askew, PA-C (dermatology): Patient presented for annual exam for history of basal cell carcinoma and rosacea. Recommended clobetasol for leg and back and metronidazole cream on face.  -07/27/20 Alfred Levins, MD (brain and spine surgery): Patient presented for follow up post-op. Unable to access notes.  -07/14/20 Truitt Merle, NP (cardiology): Patient presented for ischemic heart disease, HTN, and heart murmur follow up. Patient's BP has been soft and stopped ARB. Encouraged use of support stockings, elevating his legs and cutting back on salt use with lower extremity edema. Follow up in 2 months.  -05/30/20 Patient presented for bilateral L2-3, L3-4, and L4-5 laminectomies for decomrpession of stenosis surgery with Alfred Levins.   -05/06/20 Bernerd Pho: Patient presented for chiropractic medicine follow up.   Medications: Outpatient Encounter Medications as of 01/14/2021  Medication Sig  . acetaminophen (TYLENOL) 500 MG tablet Take 500 mg by mouth in the morning and at bedtime.  Marland Kitchen aspirin 81 MG tablet Take 81 mg by mouth daily.  . Cholecalciferol (VITAMIN D3) 125 MCG (5000 UT) CAPS Take 1 capsule (5,000 Units total) by mouth daily.  . clobetasol (OLUX) 0.05 % topical foam APPLY to back and leg  . Coenzyme Q10 100 MG TABS Take 1 tablet  by mouth daily.  . DHA-EPA-Vitamin E (OMEGA-3 COMPLEX PO) Take 1,200 mg by mouth 3 (three) times daily. ProOmega LDL  . magnesium oxide (MAG-OX) 400 MG tablet Take 400 mg daily by mouth.  .  metoprolol tartrate (LOPRESSOR) 50 MG tablet TAKE 1/2 TABLET BY MOUTH IN THE MORNING AND 1 TABLET BY MOUTH IN THE EVENIG  . metroNIDAZOLE (METROCREAM) 0.75 % cream Apply to face  . MULTIPLE VITAMIN PO Take 1 tablet by mouth daily.  Marland Kitchen MYRBETRIQ 25 MG TB24 tablet Take 25 mg by mouth daily.  . nitroGLYCERIN (NITROSTAT) 0.4 MG SL tablet PLACE 1 TABLET UNDER TONGUE EVERY 5 MINUTES AS NEEDED FOR CHEST PAIN (Patient not taking: Reported on 12/06/2020)  . Omega-3 Fatty Acids (FISH OIL ADULT GUMMIES PO) Take 1 tablet by mouth daily.  . RABEprazole (ACIPHEX) 20 MG tablet TAKE 1 TABLET BY MOUTH EVERY DAY  . Red Yeast Rice 600 MG CAPS Take 1 capsule by mouth daily.  . tamsulosin (FLOMAX) 0.4 MG CAPS capsule TAKE 1 CAPSULE BY MOUTH EVERY DAY   Facility-Administered Encounter Medications as of 01/14/2021  Medication  . 0.9 %  sodium chloride infusion   Patient lives with his wife and he continues to drive while his wife does not. He has 2 daughters. Patient's recent concerns have been back surgery in June and he saw the surgeon yesterday and is going to have a MRI for follow up.   Patient and his wife have a cleaning lady come to their house 3 days a week for 4 hours each day to help out. They eat many of their meals outside of the house at fast food places. For breakfast, patient eats cereal, sometimes eggs and lunch is often outside of the house. They also eat a lot of TV dinners.  For exercise, patient does some right leg exercises and used a recumbent bike a lot before the surgery but is wanting to get back to using it. Patient endorses trouble falling asleep and is up for like 3-4 hours before he falls asleep. He also goes to the bathroom 2-5 times per night and does take naps during the day.  Patient reports he thinks all of his medications are working and denies any current problems with them.     Current Diagnosis/Assessment:  Goals Addressed   None    Hypertension   BP goal is:   <130/80  Office blood pressures are  BP Readings from Last 3 Encounters:  09/27/20 112/68  09/14/20 130/78  09/11/20 (!) 171/91   Patient checks BP at home 1-2x per week Patient home BP readings are ranging: high 140; low of 80; typically in the 110s  Patient has failed these meds in the past:  Patient is currently controlled on the following medications:  Marland Kitchen Metoprolol tartrate 50 mg 1/2 tablet in the morning and 1 tablet in the evening  We discussed diet and exercise extensively - DASH eating plan recommendations: . Emphasizes vegetables, fruits, and whole-grains . Includes fat-free or low-fat dairy products, fish, poultry, beans, nuts, and vegetable oils . Limits foods that are high in saturated fat. These foods include fatty meats, full-fat dairy products, and tropical oils such as coconut, palm kernel, and palm oils. . Limits sugar-sweetened beverages and sweets . Limiting sodium intake to < 1500 mg/day -Diet: patient avoids adding extra salt but a lot of his fast food meals and TV dinners have sodium -Discussed recommendations for moderate aerobic exercise for 150 minutes/week spread out over 5 days for  heart healthy lifestyle  Plan Denies dizziness/lightheadedness. Continue current medications   AFIB   Patient is currently rate controlled. Office heart rates are  Pulse Readings from Last 3 Encounters:  09/27/20 84  09/14/20 79  09/11/20 88    CHA2DS2-VASc Score =   3 The patient's score is based upon: age > 58 and HTN    :[id number]}   Patient has failed these meds in past: none Patient is currently controlled on the following medications:   Metoprolol tartrate 50 mg 1/2 tablet in the morning and 1 tablet in the evening  We discussed:  monitoring heart rate along with blood pressure  Plan  Continue current medications    Hyperlipidemia   LDL goal < 70  Lipid Panel     Component Value Date/Time   CHOL 180 10/13/2019 1523   TRIG 126 10/13/2019 1523    HDL 38 (L) 10/13/2019 1523   LDLCALC 119 (H) 10/13/2019 1523   LDLDIRECT 187.2 03/31/2008 0925    Hepatic Function Latest Ref Rng & Units 10/13/2019 02/19/2019 08/14/2018  Total Protein 6.0 - 8.5 g/dL 6.2 6.4 6.4  Albumin 3.7 - 4.7 g/dL 4.4 4.3 4.4  AST 0 - 40 IU/L 13 17 16   ALT 0 - 44 IU/L 19 20 20   Alk Phosphatase 39 - 117 IU/L 72 62 64  Total Bilirubin 0.0 - 1.2 mg/dL 0.6 0.6 0.7  Bilirubin, Direct 0.00 - 0.40 mg/dL 0.13 0.16 0.17     The ASCVD Risk score (Cuney., et al., 2013) failed to calculate for the following reasons:   The 2013 ASCVD risk score is only valid for ages 26 to 67   Patient has failed these meds in past: atorvastatin, pravastatin (myalgias), Zetia  Patient is currently uncontrolled on the following medications:  . Omega 3 complex 1,200 mg one gummy daily . Red yeast rice 1 capsule daily  We discussed:  diet and exercise extensively -Diet: patient does not use butter or oil to cook -Lowering cholesterol through diet by: Marland Kitchen Limiting foods with cholesterol such as liver and other organ meats, egg yolks, shrimp, and whole milk dairy products . Avoiding saturated fats and trans fats and incorporating healthier fats, such as lean meat, nuts, and unsaturated oils like canola and olive oils . Eating foods with soluble fiber such as whole-grain cereals such as oatmeal and oat bran, fruits such as apples, bananas, oranges, pears, and prunes, legumes such as kidney beans, lentils, chick peas, black-eyed peas, and lima beans, and green leafy vegetables . Limiting alcohol intake -Exercise -Cautioned against using red yeast rice given risks with extra ingredients and links to liver failure -Discussed the benefits of statin therapy but patient declined trying again  -stop fish oil?  Plan  Continue control with diet and exercise   CAD   Patient is currently controlled on the following medications:  . Aspirin 81 mg 1 tablet daily . Nitroglycerin 0.4 mg SL tablet  PRN  We discussed: encouraged checking expiration date on nitroglycerin tablets; Monitoring for signs of bleeding such as unexplained and excessive bleeding from a cut or injury, easy or excessive bruising, blood in urine or stools, and nosebleeds without a known cause  Plan  Continue current medications  GERD   Patient has failed these meds in past: none Patient is currently controlled on the following medications:  . Rabeprazole 20 mg 1 tablet daily  We discussed:  Patient denies any symptoms of heart burn/indigestion; Non-pharmacologic management of symptoms such as elevating  the head of your bed, avoiding eating 2-3 hours before bed, avoiding triggering foods such as acidic, spicy, or fatty foods, eating smaller meals, and wearing clothes that are loose around the waist  Plan Will discuss with PCP about tapering off. Continue current medications  BPH   PSA  Date Value Ref Range Status  02/21/2019 1.49  Final  09/03/2012 1.88 0.10 - 4.00 ng/mL Final     Patient has failed these meds in past: none Patient is currently controlled on the following medications:  . Tamsulosin 0.4 mg 1 capsule daily - in AM  We discussed:  Consistent administration of medication in regards to 30 minutes to an hour after meals  Plan  Continue current medications   Overactive bladder   Patient is currently uncontrolled on the following medications:  Marland Kitchen Myrbetriq 25 mg 1 tablet daily  We discussed:  Increasing the dose of Myrbetriq - patient declined as he had side effects with a higher dose; discussed getting a new urology referral as patient was not satisfied with Dr. Lovena Neighbours but patient decided against it at this time  Plan Continue current medications   Rosacea   Patient is currently controlled on the following medications:  Marland Kitchen Metronidazole cream 0.75% as needed  Plan  Continue current medications   Pain   Patient is currently controlled on the following medications:   Tylenol  500 mg 1 tablet in AM and 1 tablet in PM . Ibuprofen as needed  We discussed:  Preference for Tylenol over NSAIDs for pain due to bleeding risk with concurrent use of aspirin ; maximum daily recommendation of 3,000 mg of Tylenol  Plan  Continue current medications   Vitamins/supplements   Last vitamin D Lab Results  Component Value Date   VD25OH 29.3 (L) 10/13/2019   Patient is currently  on the following medications:  Marland Kitchen Vitamin D3 5000 units 1 capsule daily  . Coenzyme Q10 gummy 100 mg 1 tablet daily . Magnesium 400 mg gummy 1 tablet daily . Multivitamin 1 tablet daily   Plan Recommend repeat vitamin D level given history of vitamin D deficiency. Continue current medications  Vaccines   Reviewed and discussed patient's vaccination history.    Immunization History  Administered Date(s) Administered  . Fluad Quad(high Dose 65+) 09/23/2019  . Influenza Split 10/13/2011, 09/02/2012, 12/29/2013  . Influenza Whole 10/18/2007, 09/21/2009, 09/15/2010  . Influenza, High Dose Seasonal PF 09/21/2014, 08/18/2015, 10/09/2016, 08/31/2017, 09/10/2018, 08/23/2020  . Influenza-Unspecified 08/31/2017  . PFIZER(Purple Top)SARS-COV-2 Vaccination 02/17/2020, 03/09/2020  . Pneumococcal Conjugate-13 07/27/2014  . Pneumococcal Polysaccharide-23 02/09/2016  . Tdap 08/18/2015  . Zoster 03/12/2014  . Zoster Recombinat (Shingrix) 10/16/2020   Patient reported getting [place] vaccine on  02/17/20 and 03/09/20. Will send message to CMA to add to chart.   Plan  Recommended patient receive Shingrix vaccine at pharmacy.   Medication Management   Pt uses [facility] for all medications Uses pill box? Yes - weekly (blue for him and white for herself) Pt endorses 100% compliance - not often twice a month  We discussed: Current pharmacy is preferred with insurance plan and patient is satisfied with pharmacy services  Plan  Continue current medication management strategy   Follow up: 3  month phone visit   Jeni Salles, PharmD Clinical Pharmacist [facility]

## 2021-01-17 NOTE — Progress Notes (Deleted)
No chief complaint on file.   HPI: [name] 80 y.o. come in for   Had second back surgery  Jan 03 2021 ROS: See pertinent positives and negatives per HPI.  Past Medical History:  Diagnosis Date  . Abdominal pain, right lateral 02/21/2012   progressive.    . ALLERGIC RHINITIS 07/23/2007  . Allergy   . Arthritis   . Atrial fibrillation ([place]) 07/06/2009  . Atypical mole 09/13/1992   left shoulder Dr Cheryln Manly exc  . Atypical nevi 10/08/1992   right abdomen Dr Francisca December  . BACK PAIN 11/02/2009  . BACK PAIN, LUMBAR 10/25/2010  . Basal cell carcinoma 08/06/1992   right post thigh Dr Cheryln Manly  . BCC (basal cell carcinoma of skin) 04/23/2018   left post shoulder tx with bx  . BENIGN PROSTATIC HYPERTROPHY 07/25/2007  . 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 CAD 01/22/2009  . Cancer ([place])    skin  . 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  . Cataract   . COLONIC POLYPS, HX OF 10/18/2007  . Cough 10/25/2010  . DIZZINESS 01/22/2009  . DYSPNEA ON EXERTION 08/21/2008  . ECHOCARDIOGRAM, ABNORMAL 08/19/2007  . GERD 07/25/2007  . Heart murmur   . HERNIA 07/06/2009  . HX, PERSONAL, MALIGNANCY, SKIN NEC 10/18/2007  . HYPERGLYCEMIA, BORDERLINE 12/31/2007  . HYPERLIPIDEMIA 07/25/2007  . HYPERTENSION 07/23/2007  . Inguinal hernia   . LUMBAR RADICULOPATHY, RIGHT 10/18/2007  . MUSCLE PAIN 01/22/2009  . Nausea   . NEURITIS 10/25/2010  . Palpitations 05/09/2010  . RHINITIS, CHRONIC 05/11/2010  . RUQ PAIN 01/22/2009  . Sinus polyp   . UNS ADVRS EFF OTH RX MEDICINAL&BIOLOGICAL SBSTNC 04/15/2008  . 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 VITAMIN D DEFICIENCY 11/02/2009  . Voice strain     Family History  Problem Relation Age of Onset  . Lung cancer Mother   . Cancer Mother   . Heart attack Father   . Hypertension Father   . Alcohol abuse Father   . Heart disease Father   . Breast cancer Sister   . Cancer Sister        breast  . Alcohol abuse Brother   . Hypertension Brother   . Alcohol abuse Brother    . Hypertension Brother   . Alcohol abuse Brother   . Hypertension Brother   . Kidney disease Sister   . Colon cancer Neg Hx   . Esophageal cancer Neg Hx   . Rectal cancer Neg Hx   . Stomach cancer Neg Hx     Social History   Socioeconomic History  . Marital status: Married    Spouse name: Not on file  . Number of children: Not on file  . Years of education: Not on file  . Highest education level: Not on file  Occupational History  . Not on file  Tobacco Use  . Smoking status: Former Smoker    Types: Cigarettes    Quit date: 04/02/1964    Years since quitting: 56.8  . Smokeless tobacco: Never Used  . Tobacco comment: in early 20's he quit  Vaping Use  . Vaping Use: Never used  Substance and Sexual Activity  . Alcohol use: Yes    Comment: socially  . Drug use: No  . Sexual activity: Not on file  Other Topics Concern  . Not on file  Social History Narrative   Retired [employer]   Married   Non smoker some etoh   Travels a lot  Social Determinants of Health   Financial Resource Strain: Low Risk   . Difficulty of Paying Living Expenses: Not hard at all  Food Insecurity: No Food Insecurity  . Worried About Charity fundraiser in the Last Year: Never true  . Ran Out of Food in the Last Year: Never true  Transportation Needs: No Transportation Needs  . Lack of Transportation (Medical): No  . Lack of Transportation (Non-Medical): No  Physical Activity: Inactive  . Days of Exercise per Week: 0 days  . Minutes of Exercise per Session: 0 min  Stress: No Stress Concern Present  . Feeling of Stress : Not at all  Social Connections: Moderately Integrated  . Frequency of Communication with Friends and Family: Once a week  . Frequency of Social Gatherings with Friends and Family: Twice a week  . Attends Religious Services: More than 4 times per year  . Active Member of Clubs or Organizations: No  . Attends Archivist Meetings: Never  . Marital  Status: Married    Outpatient Medications Prior to Visit  Medication Sig Dispense Refill  . acetaminophen (TYLENOL) 500 MG tablet Take 500 mg by mouth in the morning and at bedtime.    Marland Kitchen aspirin 81 MG tablet Take 81 mg by mouth daily.    . Cholecalciferol (VITAMIN D3) 125 MCG (5000 UT) CAPS Take 1 capsule (5,000 Units total) by mouth daily. 90 capsule 3  . clobetasol (OLUX) 0.05 % topical foam APPLY to back and leg 50 g 10  . Coenzyme Q10 100 MG TABS Take 1 tablet by mouth daily.    . DHA-EPA-Vitamin E (OMEGA-3 COMPLEX PO) Take 1,200 mg by mouth 3 (three) times daily. ProOmega LDL    . magnesium oxide (MAG-OX) 400 MG tablet Take 400 mg daily by mouth.    . metoprolol tartrate (LOPRESSOR) 50 MG tablet TAKE 1/2 TABLET BY MOUTH IN THE MORNING AND 1 TABLET BY MOUTH IN THE EVENIG 135 tablet 2  . metroNIDAZOLE (METROCREAM) 0.75 % cream Apply to face 45 g 0  . MULTIPLE VITAMIN PO Take 1 tablet by mouth daily.    Marland Kitchen MYRBETRIQ 25 MG TB24 tablet Take 25 mg by mouth daily.  0  . nitroGLYCERIN (NITROSTAT) 0.4 MG SL tablet PLACE 1 TABLET UNDER TONGUE EVERY 5 MINUTES AS NEEDED FOR CHEST PAIN (Patient not taking: Reported on 12/06/2020) 25 tablet 3  . Omega-3 Fatty Acids (FISH OIL ADULT GUMMIES PO) Take 1 tablet by mouth daily.    . RABEprazole (ACIPHEX) 20 MG tablet TAKE 1 TABLET BY MOUTH EVERY DAY 90 tablet 0  . Red Yeast Rice 600 MG CAPS Take 1 capsule by mouth daily.    . tamsulosin (FLOMAX) 0.4 MG CAPS capsule TAKE 1 CAPSULE BY MOUTH EVERY DAY 90 capsule 0   Facility-Administered Medications Prior to Visit  Medication Dose Route Frequency Provider Last Rate Last Admin  . 0.9 %  sodium chloride infusion  500 mL Intravenous Continuous Milus Banister, MD         EXAM:  There were no vitals taken for this visit.  There is no height or weight on file to calculate BMI.  GENERAL: vitals reviewed and listed above, alert, oriented, appears well hydrated and in no acute distress HEENT: atraumatic,  conjunctiva  clear, no obvious abnormalities on inspection of external nose and ears OP : no lesion edema or exudate  NECK: no obvious masses on inspection palpation  LUNGS: clear to auscultation bilaterally, no wheezes, rales  or rhonchi, good air movement CV: HRRR, no clubbing cyanosis or  peripheral edema nl cap refill  MS: moves all extremities without noticeable focal  abnormality PSYCH: pleasant and cooperative, no obvious depression or anxiety Lab Results  Component Value Date   WBC 7.5 07/14/2020   HGB 15.1 07/14/2020   HCT 43.6 07/14/2020   PLT 271 07/14/2020   GLUCOSE 163 (H) 07/14/2020   CHOL 180 10/13/2019   TRIG 126 10/13/2019   HDL 38 (L) 10/13/2019   LDLDIRECT 187.2 03/31/2008   LDLCALC 119 (H) 10/13/2019   ALT 19 10/13/2019   AST 13 10/13/2019   NA 141 07/14/2020   K 4.2 07/14/2020   CL 104 07/14/2020   CREATININE 0.73 (L) 07/14/2020   BUN 15 07/14/2020   CO2 22 07/14/2020   TSH 1.640 10/13/2019   PSA 1.49 02/21/2019   INR 1.1 (H) 01/25/2012   HGBA1C 5.8 02/20/2017   BP Readings from Last 3 Encounters:  09/27/20 112/68  09/14/20 130/78  09/11/20 (!) 171/91    ASSESSMENT AND PLAN:  Discussed the following assessment and plan:  No diagnosis found.  -Patient advised to return or notify health care team  if  new concerns arise.  There are no Patient Instructions on file for this visit.   Standley Brooking. Aijalon Kirtz M.D.

## 2021-01-18 ENCOUNTER — Ambulatory Visit: Payer: Medicare Other | Admitting: Internal Medicine

## 2021-01-18 DIAGNOSIS — R32 Unspecified urinary incontinence: Secondary | ICD-10-CM | POA: Diagnosis not present

## 2021-01-18 DIAGNOSIS — Z4789 Encounter for other orthopedic aftercare: Secondary | ICD-10-CM | POA: Diagnosis not present

## 2021-01-18 DIAGNOSIS — M1990 Unspecified osteoarthritis, unspecified site: Secondary | ICD-10-CM | POA: Diagnosis not present

## 2021-01-18 DIAGNOSIS — M5126 Other intervertebral disc displacement, lumbar region: Secondary | ICD-10-CM | POA: Diagnosis not present

## 2021-01-18 DIAGNOSIS — Z7982 Long term (current) use of aspirin: Secondary | ICD-10-CM | POA: Diagnosis not present

## 2021-01-18 DIAGNOSIS — E785 Hyperlipidemia, unspecified: Secondary | ICD-10-CM | POA: Diagnosis not present

## 2021-01-18 DIAGNOSIS — Z9181 History of falling: Secondary | ICD-10-CM | POA: Diagnosis not present

## 2021-01-18 DIAGNOSIS — Z951 Presence of aortocoronary bypass graft: Secondary | ICD-10-CM | POA: Diagnosis not present

## 2021-01-18 DIAGNOSIS — I10 Essential (primary) hypertension: Secondary | ICD-10-CM | POA: Diagnosis not present

## 2021-01-18 DIAGNOSIS — G834 Cauda equina syndrome: Secondary | ICD-10-CM | POA: Diagnosis not present

## 2021-01-18 DIAGNOSIS — Z0289 Encounter for other administrative examinations: Secondary | ICD-10-CM

## 2021-01-18 DIAGNOSIS — Z9049 Acquired absence of other specified parts of digestive tract: Secondary | ICD-10-CM | POA: Diagnosis not present

## 2021-01-18 DIAGNOSIS — Z85828 Personal history of other malignant neoplasm of skin: Secondary | ICD-10-CM | POA: Diagnosis not present

## 2021-01-21 ENCOUNTER — Telehealth: Payer: Self-pay | Admitting: Internal Medicine

## 2021-01-21 DIAGNOSIS — Z4789 Encounter for other orthopedic aftercare: Secondary | ICD-10-CM | POA: Diagnosis not present

## 2021-01-21 DIAGNOSIS — I10 Essential (primary) hypertension: Secondary | ICD-10-CM | POA: Diagnosis not present

## 2021-01-21 DIAGNOSIS — M5126 Other intervertebral disc displacement, lumbar region: Secondary | ICD-10-CM | POA: Diagnosis not present

## 2021-01-21 DIAGNOSIS — G834 Cauda equina syndrome: Secondary | ICD-10-CM | POA: Diagnosis not present

## 2021-01-21 DIAGNOSIS — M1990 Unspecified osteoarthritis, unspecified site: Secondary | ICD-10-CM | POA: Diagnosis not present

## 2021-01-21 DIAGNOSIS — E785 Hyperlipidemia, unspecified: Secondary | ICD-10-CM | POA: Diagnosis not present

## 2021-01-21 NOTE — Telephone Encounter (Signed)
[name] from [facility] is calling to get verbal orders.  She is requesting nurse services 1 time a week for 9 weeks.  Please advise.

## 2021-01-24 NOTE — Telephone Encounter (Signed)
  To get a home health referral Medicare requires needed diagnosis and have to have been seen face-to-face in a certain amount of time. Please advise

## 2021-01-25 ENCOUNTER — Ambulatory Visit: Payer: Medicare Other | Admitting: Pharmacist

## 2021-01-25 DIAGNOSIS — I10 Essential (primary) hypertension: Secondary | ICD-10-CM | POA: Diagnosis not present

## 2021-01-25 DIAGNOSIS — E785 Hyperlipidemia, unspecified: Secondary | ICD-10-CM | POA: Diagnosis not present

## 2021-01-25 DIAGNOSIS — Z4789 Encounter for other orthopedic aftercare: Secondary | ICD-10-CM | POA: Diagnosis not present

## 2021-01-25 DIAGNOSIS — M5126 Other intervertebral disc displacement, lumbar region: Secondary | ICD-10-CM | POA: Diagnosis not present

## 2021-01-25 DIAGNOSIS — G834 Cauda equina syndrome: Secondary | ICD-10-CM | POA: Diagnosis not present

## 2021-01-25 DIAGNOSIS — N401 Enlarged prostate with lower urinary tract symptoms: Secondary | ICD-10-CM

## 2021-01-25 DIAGNOSIS — N138 Other obstructive and reflux uropathy: Secondary | ICD-10-CM

## 2021-01-25 DIAGNOSIS — M1990 Unspecified osteoarthritis, unspecified site: Secondary | ICD-10-CM | POA: Diagnosis not present

## 2021-01-25 NOTE — Telephone Encounter (Signed)
This patient last office visit was 09/27/2020.   Should he make a f/u visit so he can receive the verbal order for home health.?

## 2021-01-25 NOTE — Chronic Care Management (AMB) (Signed)
Chronic Care Management Pharmacy  Name: [name]  MRN: [id number] DOB: November 17, 1941  Initial Planning Appointment: completed 10/11/20  Initial Questions: 1. Have you seen any other providers since your last visit? n/a 2. Any changes in your medicines or health? No   Chief Complaint/ HPI  [name],  80 y.o. , male presents for their Follow-Up CCM visit with the clinical pharmacist via telephone due to COVID-19 Pandemic.  PCP : [name], MD  Their chronic conditions include: HTN, Afib. HLD, CAD, GERD, BPH, overactive bladder, rosacea, pain  Office Visits: -12/06/20 Ofilia Neas, LPN: Patient presented for medicare annual wellness visit.  -09/27/20 Shanon Ace MD: Patient presented with increasing weakness or instability of right lower extremity with cramping in his anterior thigh. Recommended follow up with Dr. Owens Shark (brain and spine surgery specialist).  -09/16/20 Shanon Ace, MD: Patient presented for a video visit for hospitalization follow up. Patient was seen for lower abdomen pain and constipation. Patient has been drinking fluids and using Miralax daily for constipation.  -08/23/20 Shanon Ace, MD: Patient presented for back pain with right-sided sciatica evaluation. Patient tried tizanidine per surgery and did not help and baclofen helped more. Visit with Dr. Owens Shark on 8/3 and was released from care.  -05/06/20 Shanon Ace, MD: Patient presented for video visit for increased back pain. Pain is worsening and accompanied by lower muscle spasms. Prescribed baclofen.   Consult Visit: -01/03/21 Patient admitted for lumbar herniated disc surgery.  -11/16/20 Alfred Levins, MD (brain and spine surgery): Patient presented for follow up for right leg weakness. Unable to access notes.  -10/12/20 Alfred Levins, MD (brain and spine surgery): Patient presented for follow up for right leg weakness. Unable to access notes.  -09/14/20 Truitt Merle, NP (cardiology):  Patient presented for ischemic heart disease, HTN, and heart murmur follow up. Patient is not active and has a rare twinge of chest pain. Balance is poor. BP has been ok off the ARB. Follow up in 4 months.  -09/11/20 Patient presented to the ED with abdominal pain and constipation. Patient has not been able to pass stool for 12 hours.  -08/26/20 Alfred Levins, MD (brain and spine surgery): Patient presented for follow up for MRI. Unable to access notes.  -08/06/20 Robyne Askew, PA-C (dermatology): Patient presented for annual exam for history of basal cell carcinoma and rosacea. Recommended clobetasol for leg and back and metronidazole cream on face.  -07/27/20 Alfred Levins, MD (brain and spine surgery): Patient presented for follow up post-op. Unable to access notes.  -07/14/20 Truitt Merle, NP (cardiology): Patient presented for ischemic heart disease, HTN, and heart murmur follow up. Patient's BP has been soft and stopped ARB. Encouraged use of support stockings, elevating his legs and cutting back on salt use with lower extremity edema. Follow up in 2 months.  -05/30/20 Patient presented for bilateral L2-3, L3-4, and L4-5 laminectomies for decomrpession of stenosis surgery with Alfred Levins.   -05/06/20 Bernerd Pho: Patient presented for chiropractic medicine follow up.   Medications: Outpatient Encounter Medications as of 01/25/2021  Medication Sig  . acetaminophen (TYLENOL) 500 MG tablet Take 500 mg by mouth in the morning and at bedtime.  Marland Kitchen aspirin 81 MG tablet Take 81 mg by mouth daily.  . Cholecalciferol (VITAMIN D3) 125 MCG (5000 UT) CAPS Take 1 capsule (5,000 Units total) by mouth daily.  . clobetasol (OLUX) 0.05 % topical foam APPLY to back and leg  . Coenzyme Q10 100 MG TABS Take 1 tablet  by mouth daily.  . DHA-EPA-Vitamin E (OMEGA-3 COMPLEX PO) Take 1,200 mg by mouth 3 (three) times daily. ProOmega LDL  . magnesium oxide (MAG-OX) 400 MG tablet Take 400 mg daily by mouth.  .  metoprolol tartrate (LOPRESSOR) 50 MG tablet TAKE 1/2 TABLET BY MOUTH IN THE MORNING AND 1 TABLET BY MOUTH IN THE EVENIG  . metroNIDAZOLE (METROCREAM) 0.75 % cream Apply to face  . MULTIPLE VITAMIN PO Take 1 tablet by mouth daily.  Marland Kitchen MYRBETRIQ 25 MG TB24 tablet Take 25 mg by mouth daily.  . nitroGLYCERIN (NITROSTAT) 0.4 MG SL tablet PLACE 1 TABLET UNDER TONGUE EVERY 5 MINUTES AS NEEDED FOR CHEST PAIN (Patient not taking: Reported on 12/06/2020)  . Omega-3 Fatty Acids (FISH OIL ADULT GUMMIES PO) Take 1 tablet by mouth daily.  . RABEprazole (ACIPHEX) 20 MG tablet TAKE 1 TABLET BY MOUTH EVERY DAY  . Red Yeast Rice 600 MG CAPS Take 1 capsule by mouth daily.  . tamsulosin (FLOMAX) 0.4 MG CAPS capsule TAKE 1 CAPSULE BY MOUTH EVERY DAY   Facility-Administered Encounter Medications as of 01/25/2021  Medication  . 0.9 %  sodium chloride infusion   Patient was recently released from rehab for back surgery and is feeling much better.   Current Diagnosis/Assessment:  Goals Addressed            This Visit's Progress   . Pharmacy Care Plan       CARE PLAN ENTRY (see longitudinal plan of care for additional care plan information)  Current Barriers:  . Chronic Disease Management support, education, and care coordination needs related to Hypertension, Hyperlipidemia, Atrial Fibrillation, Coronary Artery Disease, GERD, Overactive Bladder, BPH, and pain   Hypertension BP Readings from Last 3 Encounters:  09/27/20 112/68  09/14/20 130/78  09/11/20 (!) 171/91   . Pharmacist Clinical Goal(s): o Over the next 150 days, patient will work with PharmD and providers to maintain BP goal <130/80 . Current regimen:  o Metoprolol tartrate 50 mg 1/2 tablet in the morning and 1 tablet in the evening . Interventions: o Discussed DASH eating plan recommendations: . Emphasizes vegetables, fruits, and whole-grains . Includes fat-free or low-fat dairy products, fish, poultry, beans, nuts, and vegetable  oils . Limits foods that are high in saturated fat. These foods include fatty meats, full-fat dairy products, and tropical oils such as coconut, palm kernel, and palm oils. . Limits sugar-sweetened beverages and sweets . Limiting sodium intake to < 1500 mg/day o Discussed recommendations for moderate aerobic exercise for 150 minutes/week spread out over 5 days for heart healthy lifestyle  . Patient self care activities - Over the next 150 days, patient will: o Check blood pressure weekly, document, and provide at future appointments o Ensure daily salt intake < 2300 mg/day  Hyperlipidemia Lab Results  Component Value Date/Time   LDLCALC 119 (H) 10/13/2019 03:23 PM   LDLDIRECT 187.2 03/31/2008 09:25 AM   . Pharmacist Clinical Goal(s): o Over the next 150 days, patient will work with PharmD and providers to achieve LDL goal < 70 . Current regimen:  . Omega 3 complex 1,200 mg one gummy daily . Red yeast rice 1 capsule daily . Interventions: o Discussed lowering cholesterol through diet by: Marland Kitchen Limiting foods with cholesterol such as liver and other organ meats, egg yolks, shrimp, and whole milk dairy products . Avoiding saturated fats and trans fats and incorporating healthier fats, such as lean meat, nuts, and unsaturated oils like canola and olive oils . Eating foods with  soluble fiber such as whole-grain cereals such as oatmeal and oat bran, fruits such as apples, bananas, oranges, pears, and prunes, legumes such as kidney beans, lentils, chick peas, black-eyed peas, and lima beans, and green leafy vegetables . Limiting alcohol intake  . Discussed risks of taking red yeast rice as there have been links to liver failure . Patient self care activities - Over the next 150 days, patient will: o Continue management with diet and lifestyle  Afib . Pharmacist Clinical Goal(s): o Over the next 150 days, patient will work with PharmD and providers to maintain heart rate between 60-100 beats per  minute . Current regimen:  o Metoprolol tartrate 50 mg 1/2 tablet in the morning and 1 tablet in the evening . Interventions: o Discussed monitoring heart rate along with blood pressure . Patient self care activities - Over the next 150 days, patient will: o Check heart rate along with blood pressure and keep a log o Continue current medication  Coronary artery disease . Pharmacist Clinical Goal(s) o Over the next 150 days, patient will work with PharmD and providers to prevent heart events . Current regimen:  . Aspirin 81 mg 1 tablet daily . Nitroglycerin 0.4 mg SL tablet as needed . Interventions: o Discussed monitoring for signs of bleeding such as unexplained and excessive bleeding from a cut or injury, easy or excessive bruising, blood in urine or stools, and nosebleeds without a known cause . Patient self care activities - Over the next 150 days, patient will: o Continue current medications  BPH . Pharmacist Clinical Goal(s) o Over the next 150 days, patient will work with PharmD and providers to manage symptoms of BPH . Current regimen:  o Tamsulosin 0.4 mg 1 capsule daily . Interventions: o We discussed consistently taking tamsulosin 30 minutes to an hour after eating to help with absorption . Patient self care activities - Over the next 150 days, patient will: o Continue current medications  Overactive bladder . Pharmacist Clinical Goal(s) o Over the next 150 days, patient will work with PharmD and providers to manage symptoms of overactive bladder . Current regimen:  o Myrbetriq 25 mg 1 tablet daily . Patient self care activities - Over the next 150 days, patient will: o Continue current medications  GERD . Pharmacist Clinical Goal(s) o Over the next 150 days, patient will work with PharmD and providers to manage symptoms of heartburn/indigestion . Current regimen:  . Rabeprazole 20 mg 1 tablet daily . Interventions: o We discussed risks of long term use of  rabeprazole o Discussed on-pharmacologic management of symptoms such as elevating the head of your bed, avoiding eating 2-3 hours before bed, avoiding triggering foods such as acidic, spicy, or fatty foods, eating smaller meals, and wearing clothes that are loose around the waist . Patient self care activities - Over the next 150 days, patient will: o Continue current medications  Pain . Pharmacist Clinical Goal(s) o Over the next 150 days, patient will work with PharmD and providers to manage pain . Current regimen:   Tylenol 500 mg 1 tablet in AM and 1 tablet in PM . Ibuprofen as needed . Interventions: o Discussed preference for Tylenol over Advil (ibuprofen) and Aleve as you are currently taking aspirin and do not want to increase bleed risk . Patient self care activities - Over the next 150 days, patient will: o Continue current medications  Medication management . Pharmacist Clinical Goal(s): o Over the next 150 days, patient will work with PharmD  and providers to maintain optimal medication adherence . Current [facility] . Interventions o Comprehensive medication review performed. o Continue current medication management strategy . Patient self care activities - Over the next 150 days, patient will: o Take medications as prescribed o Report any questions or concerns to PharmD and/or provider(s)  Please see past updates related to this goal by clicking on the "Past Updates" button in the selected goal        Hypertension   BP goal is:  <130/80  Office blood pressures are  BP Readings from Last 3 Encounters:  09/27/20 112/68  09/14/20 130/78  09/11/20 (!) 171/91   Patient checks BP at home 1-2x per week Patient home BP readings are ranging: 126/64 (usually a little higher) - home health nurse is checking BP  Patient has failed these meds in the past:  Patient is currently controlled on the following medications:  Marland Kitchen Metoprolol tartrate 50 mg 1/2 tablet in the  morning and 1 tablet in the evening  We discussed diet and exercise extensively - DASH eating plan recommendations: . Emphasizes vegetables, fruits, and whole-grains . Includes fat-free or low-fat dairy products, fish, poultry, beans, nuts, and vegetable oils . Limits foods that are high in saturated fat. These foods include fatty meats, full-fat dairy products, and tropical oils such as coconut, palm kernel, and palm oils. . Limits sugar-sweetened beverages and sweets . Limiting sodium intake to < 1500 mg/day -Diet: patient avoids adding extra salt but a lot of his fast food meals and TV dinners have sodium -Discussed recommendations for moderate aerobic exercise for 150 minutes/week spread out over 5 days for heart healthy lifestyle  Plan Denies dizziness/lightheadedness. Continue current medications   AFIB   Patient is currently rate controlled. Office heart rates are  Pulse Readings from Last 3 Encounters:  09/27/20 84  09/14/20 79  09/11/20 88    CHA2DS2-VASc Score =   3 The patient's score is based upon: age > 32 and HTN    [id number]}   Patient has failed these meds in past: none Patient is currently controlled on the following medications:   Metoprolol tartrate 50 mg 1/2 tablet in the morning and 1 tablet in the evening  We discussed:  monitoring heart rate along with blood pressure -BP was higher with PT but has come down some  Plan  Continue current medications    Hyperlipidemia   LDL goal < 70  Lipid Panel     Component Value Date/Time   CHOL 180 10/13/2019 1523   TRIG 126 10/13/2019 1523   HDL 38 (L) 10/13/2019 1523   LDLCALC 119 (H) 10/13/2019 1523   LDLDIRECT 187.2 03/31/2008 0925    Hepatic Function Latest Ref Rng & Units 10/13/2019 02/19/2019 08/14/2018  Total Protein 6.0 - 8.5 g/dL 6.2 6.4 6.4  Albumin 3.7 - 4.7 g/dL 4.4 4.3 4.4  AST 0 - 40 IU/L 13 17 16   ALT 0 - 44 IU/L 19 20 20   Alk Phosphatase 39 - 117 IU/L 72 62 64  Total Bilirubin 0.0 -  1.2 mg/dL 0.6 0.6 0.7  Bilirubin, Direct 0.00 - 0.40 mg/dL 0.13 0.16 0.17     The ASCVD Risk score ([place] et al., 2013) failed to calculate for the following reasons:   The 2013 ASCVD risk score is only valid for ages 67 to 45   Patient has failed these meds in past: atorvastatin, pravastatin (myalgias), Zetia  Patient is currently uncontrolled on the following medications:  .  Omega 3 complex 1,200 mg one gummy daily . Red yeast rice 1 capsule daily  We discussed:  diet and exercise extensively -Lowering cholesterol through diet by: Marland Kitchen Limiting foods with cholesterol such as liver and other organ meats, egg yolks, shrimp, and whole milk dairy products . Avoiding saturated fats and trans fats and incorporating healthier fats, such as lean meat, nuts, and unsaturated oils like canola and olive oils . Eating foods with soluble fiber such as whole-grain cereals such as oatmeal and oat bran, fruits such as apples, bananas, oranges, pears, and prunes, legumes such as kidney beans, lentils, chick peas, black-eyed peas, and lima beans, and green leafy vegetables . Limiting alcohol intake -Discussed the lack of benefit from fish oil with current dose and can consider stopping depending on results of lipid panel  Plan Patient will schedule physical with Dr. Regis Bill and have labs rechecked. Continue control with diet and exercise   CAD   Patient is currently controlled on the following medications:  . Aspirin 81 mg 1 tablet daily . Nitroglycerin 0.4 mg SL tablet PRN  We discussed: encouraged checking expiration date on nitroglycerin tablets; Monitoring for signs of bleeding such as unexplained and excessive bleeding from a cut or injury, easy or excessive bruising, blood in urine or stools, and nosebleeds without a known cause  Plan  Continue current medications  GERD   Patient has failed these meds in past: none Patient is currently controlled on the following medications:   . Rabeprazole 20 mg 1 tablet daily  We discussed:  Patient denies any symptoms of heart burn/indigestion; Non-pharmacologic management of symptoms such as elevating the head of your bed, avoiding eating 2-3 hours before bed, avoiding triggering foods such as acidic, spicy, or fatty foods, eating smaller meals, and wearing clothes that are loose around the waist  Plan Will discuss with PCP about tapering off. Continue current medications  BPH   PSA  Date Value Ref Range Status  02/21/2019 1.49  Final  09/03/2012 1.88 0.10 - 4.00 ng/mL Final     Patient has failed these meds in past: none Patient is currently controlled on the following medications:  . Tamsulosin 0.4 mg 1 capsule daily - in AM  We discussed:  Consistent administration of medication in regards to 30 minutes to an hour after meals  Plan  Continue current medications   Overactive bladder   Patient is currently uncontrolled on the following medications:  Marland Kitchen Myrbetriq 25 mg 1 tablet daily  We discussed:  Increasing the dose of Myrbetriq - patient declined as he had side effects with a higher dose; discussed getting a new urology referral as patient was not satisfied with Dr. Lovena Neighbours but patient decided against it at this time  Plan Continue current medications   Rosacea   Patient is currently controlled on the following medications:  Marland Kitchen Metronidazole cream 0.75% as needed  Plan  Continue current medications   Pain   Patient is currently controlled on the following medications:   Tylenol 500 mg 1 tablet in AM and 1 tablet in PM . Ibuprofen as needed  We discussed:  patient is only using Tylenol for pain  Plan  Continue current medications   Vitamins/supplements   Last vitamin D Lab Results  Component Value Date   VD25OH 29.3 (L) 10/13/2019   Patient is currently  on the following medications:  Marland Kitchen Vitamin D3 5000 units 1 capsule daily  . Coenzyme Q10 gummy 100 mg 1 tablet daily . Magnesium 400  mg gummy 1 tablet daily . Multivitamin 1 tablet daily   Plan Recommend repeat vitamin D level given history of vitamin D deficiency. Continue current medications  Vaccines   Reviewed and discussed patient's vaccination history.    Immunization History  Administered Date(s) Administered  . Fluad Quad(high Dose 65+) 09/23/2019  . Influenza Split 10/13/2011, 09/02/2012, 12/29/2013  . Influenza Whole 10/18/2007, 09/21/2009, 09/15/2010  . Influenza, High Dose Seasonal PF 09/21/2014, 08/18/2015, 10/09/2016, 08/31/2017, 09/10/2018, 08/23/2020  . Influenza-Unspecified 08/31/2017  . PFIZER(Purple Top)SARS-COV-2 Vaccination 02/17/2020, 03/09/2020  . Pneumococcal Conjugate-13 07/27/2014  . Pneumococcal Polysaccharide-23 02/09/2016  . Tdap 08/18/2015  . Zoster 03/12/2014  . Zoster Recombinat (Shingrix) 10/16/2020    Plan  Recommended patient receive 2nd dose of Shingrix vaccine at pharmacy.   Medication Management   [facility] Uses pill box? Yes - weekly (blue for him and white for herself) Pt endorses 100% compliance - not often twice a month  We discussed: Current pharmacy is preferred with insurance plan and patient is satisfied with pharmacy services  Plan  Continue current medication management strategy   Follow up: 5 month phone visit   Jeni Salles, PharmD Clinical Pharmacist [facility]

## 2021-01-26 ENCOUNTER — Telehealth: Payer: Self-pay | Admitting: Internal Medicine

## 2021-01-26 NOTE — Telephone Encounter (Signed)
So there are many threads about this gentlemen's care.  Please see Madd[facility]'s [place]  upstream messages and evaluations. He can make a in person visit within a few months( because it is hard for him to get here) If there are more specific issues he can make a video visit.  Postsurgical problems should be referred to the surgeon of record.

## 2021-01-26 NOTE — Telephone Encounter (Signed)
Donita w/[facility] is calling to get verbal orders for skill nursing for 1 week 9.  May leave a detail msg on her secured voice mail.

## 2021-01-26 NOTE — Telephone Encounter (Signed)
Okay to do medical home health for me  Anything with questions related to his postop back surgery status should be referred to the surgeon Dr. Owens Shark and his team.

## 2021-01-26 NOTE — Patient Instructions (Signed)
Visit Information  Goals Addressed            This Visit's Progress   . Pharmacy Care Plan       CARE PLAN ENTRY (see longitudinal plan of care for additional care plan information)  Current Barriers:  . Chronic Disease Management support, education, and care coordination needs related to Hypertension, Hyperlipidemia, Atrial Fibrillation, Coronary Artery Disease, GERD, Overactive Bladder, BPH, and pain   Hypertension BP Readings from Last 3 Encounters:  09/27/20 112/68  09/14/20 130/78  09/11/20 (!) 171/91   . Pharmacist Clinical Goal(s): o Over the next 150 days, patient will work with PharmD and providers to maintain BP goal <130/80 . Current regimen:  o Metoprolol tartrate 50 mg 1/2 tablet in the morning and 1 tablet in the evening . Interventions: o Discussed DASH eating plan recommendations: . Emphasizes vegetables, fruits, and whole-grains . Includes fat-free or low-fat dairy products, fish, poultry, beans, nuts, and vegetable oils . Limits foods that are high in saturated fat. These foods include fatty meats, full-fat dairy products, and tropical oils such as coconut, palm kernel, and palm oils. . Limits sugar-sweetened beverages and sweets . Limiting sodium intake to < 1500 mg/day o Discussed recommendations for moderate aerobic exercise for 150 minutes/week spread out over 5 days for heart healthy lifestyle  . Patient self care activities - Over the next 150 days, patient will: o Check blood pressure weekly, document, and provide at future appointments o Ensure daily salt intake < 2300 mg/day  Hyperlipidemia Lab Results  Component Value Date/Time   LDLCALC 119 (H) 10/13/2019 03:23 PM   LDLDIRECT 187.2 03/31/2008 09:25 AM   . Pharmacist Clinical Goal(s): o Over the next 150 days, patient will work with PharmD and providers to achieve LDL goal < 70 . Current regimen:  . Omega 3 complex 1,200 mg one gummy daily . Red yeast rice 1 capsule  daily . Interventions: o Discussed lowering cholesterol through diet by: Marland Kitchen Limiting foods with cholesterol such as liver and other organ meats, egg yolks, shrimp, and whole milk dairy products . Avoiding saturated fats and trans fats and incorporating healthier fats, such as lean meat, nuts, and unsaturated oils like canola and olive oils . Eating foods with soluble fiber such as whole-grain cereals such as oatmeal and oat bran, fruits such as apples, bananas, oranges, pears, and prunes, legumes such as kidney beans, lentils, chick peas, black-eyed peas, and lima beans, and green leafy vegetables . Limiting alcohol intake  . Discussed risks of taking red yeast rice as there have been links to liver failure . Patient self care activities - Over the next 150 days, patient will: o Continue management with diet and lifestyle  Afib . Pharmacist Clinical Goal(s): o Over the next 150 days, patient will work with PharmD and providers to maintain heart rate between 60-100 beats per minute . Current regimen:  o Metoprolol tartrate 50 mg 1/2 tablet in the morning and 1 tablet in the evening . Interventions: o Discussed monitoring heart rate along with blood pressure . Patient self care activities - Over the next 150 days, patient will: o Check heart rate along with blood pressure and keep a log o Continue current medication  Coronary artery disease . Pharmacist Clinical Goal(s) o Over the next 150 days, patient will work with PharmD and providers to prevent heart events . Current regimen:  . Aspirin 81 mg 1 tablet daily . Nitroglycerin 0.4 mg SL tablet as needed . Interventions: o Discussed monitoring  for signs of bleeding such as unexplained and excessive bleeding from a cut or injury, easy or excessive bruising, blood in urine or stools, and nosebleeds without a known cause . Patient self care activities - Over the next 150 days, patient will: o Continue current medications  BPH . Pharmacist  Clinical Goal(s) o Over the next 150 days, patient will work with PharmD and providers to manage symptoms of BPH . Current regimen:  o Tamsulosin 0.4 mg 1 capsule daily . Interventions: o We discussed consistently taking tamsulosin 30 minutes to an hour after eating to help with absorption . Patient self care activities - Over the next 150 days, patient will: o Continue current medications  Overactive bladder . Pharmacist Clinical Goal(s) o Over the next 150 days, patient will work with PharmD and providers to manage symptoms of overactive bladder . Current regimen:  o Myrbetriq 25 mg 1 tablet daily . Patient self care activities - Over the next 150 days, patient will: o Continue current medications  GERD . Pharmacist Clinical Goal(s) o Over the next 150 days, patient will work with PharmD and providers to manage symptoms of heartburn/indigestion . Current regimen:  . Rabeprazole 20 mg 1 tablet daily . Interventions: o We discussed risks of long term use of rabeprazole o Discussed on-pharmacologic management of symptoms such as elevating the head of your bed, avoiding eating 2-3 hours before bed, avoiding triggering foods such as acidic, spicy, or fatty foods, eating smaller meals, and wearing clothes that are loose around the waist . Patient self care activities - Over the next 150 days, patient will: o Continue current medications  Pain . Pharmacist Clinical Goal(s) o Over the next 150 days, patient will work with PharmD and providers to manage pain . Current regimen:   Tylenol 500 mg 1 tablet in AM and 1 tablet in PM . Ibuprofen as needed . Interventions: o Discussed preference for Tylenol over Advil (ibuprofen) and Aleve as you are currently taking aspirin and do not want to increase bleed risk . Patient self care activities - Over the next 150 days, patient will: o Continue current medications  Medication management . Pharmacist Clinical Goal(s): o Over the next 150  days, patient will work with PharmD and providers to maintain optimal medication adherence . Current pharmacy: [facility] . Interventions o Comprehensive medication review performed. o Continue current medication management strategy . Patient self care activities - Over the next 150 days, patient will: o Take medications as prescribed o Report any questions or concerns to PharmD and/or provider(s)  Please see past updates related to this goal by clicking on the "Past Updates" button in the selected goal         Patient verbalizes understanding of instructions provided today and agrees to view in [place].   Telephone follow up appointment with pharmacy team member scheduled for: 5 months  Viona Gilmore, [facility]

## 2021-01-27 DIAGNOSIS — M5126 Other intervertebral disc displacement, lumbar region: Secondary | ICD-10-CM | POA: Diagnosis not present

## 2021-01-27 DIAGNOSIS — E785 Hyperlipidemia, unspecified: Secondary | ICD-10-CM | POA: Diagnosis not present

## 2021-01-27 DIAGNOSIS — M1990 Unspecified osteoarthritis, unspecified site: Secondary | ICD-10-CM | POA: Diagnosis not present

## 2021-01-27 DIAGNOSIS — Z4789 Encounter for other orthopedic aftercare: Secondary | ICD-10-CM | POA: Diagnosis not present

## 2021-01-27 DIAGNOSIS — I10 Essential (primary) hypertension: Secondary | ICD-10-CM | POA: Diagnosis not present

## 2021-01-27 DIAGNOSIS — G834 Cauda equina syndrome: Secondary | ICD-10-CM | POA: Diagnosis not present

## 2021-01-27 NOTE — Telephone Encounter (Signed)
Spoke with Donita, informed her of the message and orders as below.

## 2021-01-27 NOTE — Telephone Encounter (Signed)
See prior note from Timmothy Sours, [place] regarding home health care.  Spoke with the pts wife and scheduled an appt on 4/4 to arrive at 1:15pm.  Mrs Halbert wanted to let Dr Regis Bill know the pt had surgery and is doing well.  Message sent to PCP.

## 2021-01-28 ENCOUNTER — Telehealth: Payer: Self-pay | Admitting: Internal Medicine

## 2021-01-28 DIAGNOSIS — E785 Hyperlipidemia, unspecified: Secondary | ICD-10-CM | POA: Diagnosis not present

## 2021-01-28 DIAGNOSIS — M1990 Unspecified osteoarthritis, unspecified site: Secondary | ICD-10-CM | POA: Diagnosis not present

## 2021-01-28 DIAGNOSIS — I10 Essential (primary) hypertension: Secondary | ICD-10-CM | POA: Diagnosis not present

## 2021-01-28 DIAGNOSIS — Z4789 Encounter for other orthopedic aftercare: Secondary | ICD-10-CM | POA: Diagnosis not present

## 2021-01-28 DIAGNOSIS — G834 Cauda equina syndrome: Secondary | ICD-10-CM | POA: Diagnosis not present

## 2021-01-28 DIAGNOSIS — M5126 Other intervertebral disc displacement, lumbar region: Secondary | ICD-10-CM | POA: Diagnosis not present

## 2021-01-28 NOTE — Telephone Encounter (Signed)
[name]  [name] needs verbal orders for Home Health OT  1 x 4 weeks

## 2021-01-31 DIAGNOSIS — M5126 Other intervertebral disc displacement, lumbar region: Secondary | ICD-10-CM | POA: Diagnosis not present

## 2021-01-31 DIAGNOSIS — M1990 Unspecified osteoarthritis, unspecified site: Secondary | ICD-10-CM | POA: Diagnosis not present

## 2021-01-31 DIAGNOSIS — G834 Cauda equina syndrome: Secondary | ICD-10-CM | POA: Diagnosis not present

## 2021-01-31 DIAGNOSIS — E785 Hyperlipidemia, unspecified: Secondary | ICD-10-CM | POA: Diagnosis not present

## 2021-01-31 DIAGNOSIS — Z4789 Encounter for other orthopedic aftercare: Secondary | ICD-10-CM | POA: Diagnosis not present

## 2021-01-31 DIAGNOSIS — I10 Essential (primary) hypertension: Secondary | ICD-10-CM | POA: Diagnosis not present

## 2021-01-31 NOTE — Telephone Encounter (Signed)
Yes  Ok to give orders

## 2021-01-31 NOTE — Telephone Encounter (Signed)
Called Manuela Schwartz back and left Verbal orders in detailed VM. -JMA

## 2021-02-02 DIAGNOSIS — E785 Hyperlipidemia, unspecified: Secondary | ICD-10-CM | POA: Diagnosis not present

## 2021-02-02 DIAGNOSIS — I10 Essential (primary) hypertension: Secondary | ICD-10-CM | POA: Diagnosis not present

## 2021-02-02 DIAGNOSIS — Z4789 Encounter for other orthopedic aftercare: Secondary | ICD-10-CM | POA: Diagnosis not present

## 2021-02-02 DIAGNOSIS — M1990 Unspecified osteoarthritis, unspecified site: Secondary | ICD-10-CM | POA: Diagnosis not present

## 2021-02-02 DIAGNOSIS — M5126 Other intervertebral disc displacement, lumbar region: Secondary | ICD-10-CM | POA: Diagnosis not present

## 2021-02-02 DIAGNOSIS — G834 Cauda equina syndrome: Secondary | ICD-10-CM | POA: Diagnosis not present

## 2021-02-04 DIAGNOSIS — M5126 Other intervertebral disc displacement, lumbar region: Secondary | ICD-10-CM | POA: Diagnosis not present

## 2021-02-04 DIAGNOSIS — I10 Essential (primary) hypertension: Secondary | ICD-10-CM | POA: Diagnosis not present

## 2021-02-04 DIAGNOSIS — G834 Cauda equina syndrome: Secondary | ICD-10-CM | POA: Diagnosis not present

## 2021-02-04 DIAGNOSIS — M1990 Unspecified osteoarthritis, unspecified site: Secondary | ICD-10-CM | POA: Diagnosis not present

## 2021-02-04 DIAGNOSIS — E785 Hyperlipidemia, unspecified: Secondary | ICD-10-CM | POA: Diagnosis not present

## 2021-02-04 DIAGNOSIS — Z4789 Encounter for other orthopedic aftercare: Secondary | ICD-10-CM | POA: Diagnosis not present

## 2021-02-07 DIAGNOSIS — I10 Essential (primary) hypertension: Secondary | ICD-10-CM | POA: Diagnosis not present

## 2021-02-07 DIAGNOSIS — G834 Cauda equina syndrome: Secondary | ICD-10-CM | POA: Diagnosis not present

## 2021-02-07 DIAGNOSIS — Z4789 Encounter for other orthopedic aftercare: Secondary | ICD-10-CM | POA: Diagnosis not present

## 2021-02-07 DIAGNOSIS — M1990 Unspecified osteoarthritis, unspecified site: Secondary | ICD-10-CM | POA: Diagnosis not present

## 2021-02-07 DIAGNOSIS — M5126 Other intervertebral disc displacement, lumbar region: Secondary | ICD-10-CM | POA: Diagnosis not present

## 2021-02-07 DIAGNOSIS — E785 Hyperlipidemia, unspecified: Secondary | ICD-10-CM | POA: Diagnosis not present

## 2021-02-09 DIAGNOSIS — M5126 Other intervertebral disc displacement, lumbar region: Secondary | ICD-10-CM | POA: Diagnosis not present

## 2021-02-09 DIAGNOSIS — E785 Hyperlipidemia, unspecified: Secondary | ICD-10-CM | POA: Diagnosis not present

## 2021-02-09 DIAGNOSIS — Z4789 Encounter for other orthopedic aftercare: Secondary | ICD-10-CM | POA: Diagnosis not present

## 2021-02-09 DIAGNOSIS — I10 Essential (primary) hypertension: Secondary | ICD-10-CM | POA: Diagnosis not present

## 2021-02-09 DIAGNOSIS — M1990 Unspecified osteoarthritis, unspecified site: Secondary | ICD-10-CM | POA: Diagnosis not present

## 2021-02-09 DIAGNOSIS — G834 Cauda equina syndrome: Secondary | ICD-10-CM | POA: Diagnosis not present

## 2021-02-10 DIAGNOSIS — E785 Hyperlipidemia, unspecified: Secondary | ICD-10-CM | POA: Diagnosis not present

## 2021-02-10 DIAGNOSIS — I10 Essential (primary) hypertension: Secondary | ICD-10-CM | POA: Diagnosis not present

## 2021-02-10 DIAGNOSIS — G834 Cauda equina syndrome: Secondary | ICD-10-CM | POA: Diagnosis not present

## 2021-02-10 DIAGNOSIS — M5126 Other intervertebral disc displacement, lumbar region: Secondary | ICD-10-CM | POA: Diagnosis not present

## 2021-02-10 DIAGNOSIS — M1990 Unspecified osteoarthritis, unspecified site: Secondary | ICD-10-CM | POA: Diagnosis not present

## 2021-02-10 DIAGNOSIS — Z4789 Encounter for other orthopedic aftercare: Secondary | ICD-10-CM | POA: Diagnosis not present

## 2021-02-14 DIAGNOSIS — E785 Hyperlipidemia, unspecified: Secondary | ICD-10-CM | POA: Diagnosis not present

## 2021-02-14 DIAGNOSIS — I10 Essential (primary) hypertension: Secondary | ICD-10-CM | POA: Diagnosis not present

## 2021-02-14 DIAGNOSIS — Z4789 Encounter for other orthopedic aftercare: Secondary | ICD-10-CM | POA: Diagnosis not present

## 2021-02-14 DIAGNOSIS — M5126 Other intervertebral disc displacement, lumbar region: Secondary | ICD-10-CM | POA: Diagnosis not present

## 2021-02-14 DIAGNOSIS — G834 Cauda equina syndrome: Secondary | ICD-10-CM | POA: Diagnosis not present

## 2021-02-14 DIAGNOSIS — M1990 Unspecified osteoarthritis, unspecified site: Secondary | ICD-10-CM | POA: Diagnosis not present

## 2021-02-17 DIAGNOSIS — M5126 Other intervertebral disc displacement, lumbar region: Secondary | ICD-10-CM | POA: Diagnosis not present

## 2021-02-17 DIAGNOSIS — Z85828 Personal history of other malignant neoplasm of skin: Secondary | ICD-10-CM | POA: Diagnosis not present

## 2021-02-17 DIAGNOSIS — Z9181 History of falling: Secondary | ICD-10-CM | POA: Diagnosis not present

## 2021-02-17 DIAGNOSIS — I10 Essential (primary) hypertension: Secondary | ICD-10-CM | POA: Diagnosis not present

## 2021-02-17 DIAGNOSIS — Z4789 Encounter for other orthopedic aftercare: Secondary | ICD-10-CM | POA: Diagnosis not present

## 2021-02-17 DIAGNOSIS — Z9049 Acquired absence of other specified parts of digestive tract: Secondary | ICD-10-CM | POA: Diagnosis not present

## 2021-02-17 DIAGNOSIS — Z7982 Long term (current) use of aspirin: Secondary | ICD-10-CM | POA: Diagnosis not present

## 2021-02-17 DIAGNOSIS — M1990 Unspecified osteoarthritis, unspecified site: Secondary | ICD-10-CM | POA: Diagnosis not present

## 2021-02-17 DIAGNOSIS — R32 Unspecified urinary incontinence: Secondary | ICD-10-CM | POA: Diagnosis not present

## 2021-02-17 DIAGNOSIS — Z951 Presence of aortocoronary bypass graft: Secondary | ICD-10-CM | POA: Diagnosis not present

## 2021-02-17 DIAGNOSIS — G834 Cauda equina syndrome: Secondary | ICD-10-CM | POA: Diagnosis not present

## 2021-02-17 DIAGNOSIS — E785 Hyperlipidemia, unspecified: Secondary | ICD-10-CM | POA: Diagnosis not present

## 2021-02-18 DIAGNOSIS — M5126 Other intervertebral disc displacement, lumbar region: Secondary | ICD-10-CM | POA: Diagnosis not present

## 2021-02-18 DIAGNOSIS — G834 Cauda equina syndrome: Secondary | ICD-10-CM | POA: Diagnosis not present

## 2021-02-18 DIAGNOSIS — E785 Hyperlipidemia, unspecified: Secondary | ICD-10-CM | POA: Diagnosis not present

## 2021-02-18 DIAGNOSIS — I10 Essential (primary) hypertension: Secondary | ICD-10-CM | POA: Diagnosis not present

## 2021-02-18 DIAGNOSIS — Z4789 Encounter for other orthopedic aftercare: Secondary | ICD-10-CM | POA: Diagnosis not present

## 2021-02-18 DIAGNOSIS — M1990 Unspecified osteoarthritis, unspecified site: Secondary | ICD-10-CM | POA: Diagnosis not present

## 2021-02-21 ENCOUNTER — Other Ambulatory Visit: Payer: Self-pay | Admitting: Internal Medicine

## 2021-02-21 DIAGNOSIS — Z4789 Encounter for other orthopedic aftercare: Secondary | ICD-10-CM | POA: Diagnosis not present

## 2021-02-21 DIAGNOSIS — M5126 Other intervertebral disc displacement, lumbar region: Secondary | ICD-10-CM | POA: Diagnosis not present

## 2021-02-21 DIAGNOSIS — G834 Cauda equina syndrome: Secondary | ICD-10-CM | POA: Diagnosis not present

## 2021-02-21 DIAGNOSIS — I10 Essential (primary) hypertension: Secondary | ICD-10-CM | POA: Diagnosis not present

## 2021-02-21 DIAGNOSIS — M1990 Unspecified osteoarthritis, unspecified site: Secondary | ICD-10-CM | POA: Diagnosis not present

## 2021-02-21 DIAGNOSIS — E785 Hyperlipidemia, unspecified: Secondary | ICD-10-CM | POA: Diagnosis not present

## 2021-02-24 DIAGNOSIS — E785 Hyperlipidemia, unspecified: Secondary | ICD-10-CM | POA: Diagnosis not present

## 2021-02-24 DIAGNOSIS — M5126 Other intervertebral disc displacement, lumbar region: Secondary | ICD-10-CM | POA: Diagnosis not present

## 2021-02-24 DIAGNOSIS — G834 Cauda equina syndrome: Secondary | ICD-10-CM | POA: Diagnosis not present

## 2021-02-24 DIAGNOSIS — M1990 Unspecified osteoarthritis, unspecified site: Secondary | ICD-10-CM | POA: Diagnosis not present

## 2021-02-24 DIAGNOSIS — Z4789 Encounter for other orthopedic aftercare: Secondary | ICD-10-CM | POA: Diagnosis not present

## 2021-02-24 DIAGNOSIS — I10 Essential (primary) hypertension: Secondary | ICD-10-CM | POA: Diagnosis not present

## 2021-02-28 DIAGNOSIS — M1990 Unspecified osteoarthritis, unspecified site: Secondary | ICD-10-CM | POA: Diagnosis not present

## 2021-02-28 DIAGNOSIS — E785 Hyperlipidemia, unspecified: Secondary | ICD-10-CM | POA: Diagnosis not present

## 2021-02-28 DIAGNOSIS — M5126 Other intervertebral disc displacement, lumbar region: Secondary | ICD-10-CM | POA: Diagnosis not present

## 2021-02-28 DIAGNOSIS — Z4789 Encounter for other orthopedic aftercare: Secondary | ICD-10-CM | POA: Diagnosis not present

## 2021-02-28 DIAGNOSIS — G834 Cauda equina syndrome: Secondary | ICD-10-CM | POA: Diagnosis not present

## 2021-02-28 DIAGNOSIS — I10 Essential (primary) hypertension: Secondary | ICD-10-CM | POA: Diagnosis not present

## 2021-03-03 DIAGNOSIS — G834 Cauda equina syndrome: Secondary | ICD-10-CM | POA: Diagnosis not present

## 2021-03-03 DIAGNOSIS — M1990 Unspecified osteoarthritis, unspecified site: Secondary | ICD-10-CM | POA: Diagnosis not present

## 2021-03-03 DIAGNOSIS — I10 Essential (primary) hypertension: Secondary | ICD-10-CM | POA: Diagnosis not present

## 2021-03-03 DIAGNOSIS — M5126 Other intervertebral disc displacement, lumbar region: Secondary | ICD-10-CM | POA: Diagnosis not present

## 2021-03-03 DIAGNOSIS — E785 Hyperlipidemia, unspecified: Secondary | ICD-10-CM | POA: Diagnosis not present

## 2021-03-03 DIAGNOSIS — M47816 Spondylosis without myelopathy or radiculopathy, lumbar region: Secondary | ICD-10-CM | POA: Diagnosis not present

## 2021-03-03 DIAGNOSIS — Z9889 Other specified postprocedural states: Secondary | ICD-10-CM | POA: Diagnosis not present

## 2021-03-03 DIAGNOSIS — Z4789 Encounter for other orthopedic aftercare: Secondary | ICD-10-CM | POA: Diagnosis not present

## 2021-03-07 DIAGNOSIS — Z4789 Encounter for other orthopedic aftercare: Secondary | ICD-10-CM | POA: Diagnosis not present

## 2021-03-07 DIAGNOSIS — M5126 Other intervertebral disc displacement, lumbar region: Secondary | ICD-10-CM | POA: Diagnosis not present

## 2021-03-07 DIAGNOSIS — I10 Essential (primary) hypertension: Secondary | ICD-10-CM | POA: Diagnosis not present

## 2021-03-07 DIAGNOSIS — E785 Hyperlipidemia, unspecified: Secondary | ICD-10-CM | POA: Diagnosis not present

## 2021-03-07 DIAGNOSIS — G834 Cauda equina syndrome: Secondary | ICD-10-CM | POA: Diagnosis not present

## 2021-03-07 DIAGNOSIS — M1990 Unspecified osteoarthritis, unspecified site: Secondary | ICD-10-CM | POA: Diagnosis not present

## 2021-03-08 DIAGNOSIS — E785 Hyperlipidemia, unspecified: Secondary | ICD-10-CM | POA: Diagnosis not present

## 2021-03-08 DIAGNOSIS — M1990 Unspecified osteoarthritis, unspecified site: Secondary | ICD-10-CM | POA: Diagnosis not present

## 2021-03-08 DIAGNOSIS — G834 Cauda equina syndrome: Secondary | ICD-10-CM | POA: Diagnosis not present

## 2021-03-08 DIAGNOSIS — Z4789 Encounter for other orthopedic aftercare: Secondary | ICD-10-CM | POA: Diagnosis not present

## 2021-03-08 DIAGNOSIS — I10 Essential (primary) hypertension: Secondary | ICD-10-CM | POA: Diagnosis not present

## 2021-03-08 DIAGNOSIS — M5126 Other intervertebral disc displacement, lumbar region: Secondary | ICD-10-CM | POA: Diagnosis not present

## 2021-03-14 DIAGNOSIS — M1990 Unspecified osteoarthritis, unspecified site: Secondary | ICD-10-CM | POA: Diagnosis not present

## 2021-03-14 DIAGNOSIS — Z4789 Encounter for other orthopedic aftercare: Secondary | ICD-10-CM | POA: Diagnosis not present

## 2021-03-14 DIAGNOSIS — E785 Hyperlipidemia, unspecified: Secondary | ICD-10-CM | POA: Diagnosis not present

## 2021-03-14 DIAGNOSIS — M5126 Other intervertebral disc displacement, lumbar region: Secondary | ICD-10-CM | POA: Diagnosis not present

## 2021-03-14 DIAGNOSIS — G834 Cauda equina syndrome: Secondary | ICD-10-CM | POA: Diagnosis not present

## 2021-03-14 DIAGNOSIS — I10 Essential (primary) hypertension: Secondary | ICD-10-CM | POA: Diagnosis not present

## 2021-03-21 ENCOUNTER — Other Ambulatory Visit: Payer: Self-pay | Admitting: Internal Medicine

## 2021-03-24 DIAGNOSIS — G20 Parkinson's disease: Secondary | ICD-10-CM | POA: Diagnosis not present

## 2021-03-24 DIAGNOSIS — M47816 Spondylosis without myelopathy or radiculopathy, lumbar region: Secondary | ICD-10-CM | POA: Diagnosis not present

## 2021-03-24 DIAGNOSIS — Z9889 Other specified postprocedural states: Secondary | ICD-10-CM | POA: Diagnosis not present

## 2021-03-28 ENCOUNTER — Encounter: Payer: Self-pay | Admitting: Internal Medicine

## 2021-03-28 ENCOUNTER — Ambulatory Visit (INDEPENDENT_AMBULATORY_CARE_PROVIDER_SITE_OTHER): Payer: Medicare Other | Admitting: Internal Medicine

## 2021-03-28 ENCOUNTER — Other Ambulatory Visit: Payer: Self-pay

## 2021-03-28 VITALS — BP 116/66 | HR 71 | Temp 97.7°F | Ht 67.5 in | Wt 204.4 lb

## 2021-03-28 DIAGNOSIS — Z79899 Other long term (current) drug therapy: Secondary | ICD-10-CM

## 2021-03-28 DIAGNOSIS — R21 Rash and other nonspecific skin eruption: Secondary | ICD-10-CM

## 2021-03-28 DIAGNOSIS — R251 Tremor, unspecified: Secondary | ICD-10-CM

## 2021-03-28 DIAGNOSIS — R739 Hyperglycemia, unspecified: Secondary | ICD-10-CM

## 2021-03-28 DIAGNOSIS — R269 Unspecified abnormalities of gait and mobility: Secondary | ICD-10-CM

## 2021-03-28 DIAGNOSIS — N3941 Urge incontinence: Secondary | ICD-10-CM | POA: Diagnosis not present

## 2021-03-28 DIAGNOSIS — Z9889 Other specified postprocedural states: Secondary | ICD-10-CM

## 2021-03-28 DIAGNOSIS — R0683 Snoring: Secondary | ICD-10-CM

## 2021-03-28 LAB — VITAMIN B12: Vitamin B-12: >1506 pg/mL — ABNORMAL HIGH (ref 211–911)

## 2021-03-28 LAB — CBC WITH DIFFERENTIAL/PLATELET
Basophils Absolute: 0.1 10*3/uL (ref 0.0–0.1)
Basophils Relative: 1.2 % (ref 0.0–3.0)
Eosinophils Absolute: 0.3 10*3/uL (ref 0.0–0.7)
Eosinophils Relative: 3.6 % (ref 0.0–5.0)
HCT: 44.9 % (ref 39.0–52.0)
Hemoglobin: 15.6 g/dL (ref 13.0–17.0)
Lymphocytes Relative: 20.7 % (ref 12.0–46.0)
Lymphs Abs: 1.6 10*3/uL (ref 0.7–4.0)
MCHC: 34.8 g/dL (ref 30.0–36.0)
MCV: 93.5 fl (ref 78.0–100.0)
Monocytes Absolute: 1.1 10*3/uL — ABNORMAL HIGH (ref 0.1–1.0)
Monocytes Relative: 15.1 % — ABNORMAL HIGH (ref 3.0–12.0)
Neutro Abs: 4.5 10*3/uL (ref 1.4–7.7)
Neutrophils Relative %: 59.4 % (ref 43.0–77.0)
Platelets: 250 10*3/uL (ref 150.0–400.0)
RBC: 4.8 Mil/uL (ref 4.22–5.81)
RDW: 13.9 % (ref 11.5–15.5)
WBC: 7.6 10*3/uL (ref 4.0–10.5)

## 2021-03-28 LAB — BASIC METABOLIC PANEL
BUN: 17 mg/dL (ref 6–23)
CO2: 25 mEq/L (ref 19–32)
Calcium: 9.8 mg/dL (ref 8.4–10.5)
Chloride: 106 mEq/L (ref 96–112)
Creatinine, Ser: 0.76 mg/dL (ref 0.40–1.50)
GFR: 84.34 mL/min (ref >60.00)
Glucose, Bld: 130 mg/dL — ABNORMAL HIGH (ref 70–99)
Potassium: 4.1 mEq/L (ref 3.5–5.1)
Sodium: 141 mEq/L (ref 135–145)

## 2021-03-28 LAB — T4, FREE: Free T4: 1.18 ng/dL (ref 0.60–1.60)

## 2021-03-28 LAB — HEMOGLOBIN A1C: Hgb A1c MFr Bld: 6 % (ref 4.6–6.5)

## 2021-03-28 LAB — TSH: TSH: 1.49 u[IU]/mL (ref 0.35–4.50)

## 2021-03-28 NOTE — Patient Instructions (Addendum)
The skin area may be  Psoriasis or other and  Needs reassessment by dermatologist .  . Please make appt   The shaking could be left over weakness from the  Back and surgical needs  And hopefully will improve   Had blood test in   Hospital was ok  .  Probabaly  Not  parkinsons  But  Understand concerns   May better with    Rehabilitation. .     WIll  plan a referral to neurology  To check for movement disorder .  And  Memory concerns . ? Sleep problem   ? If could have  Sleep apnea . ?

## 2021-03-28 NOTE — Progress Notes (Signed)
Chief Complaint  Patient presents with  . Follow-up    HPI: [name] 80 y.o. come in for fu CMD  And concerns  He is Sp Lumbar surgery x 2  Jan 22 last one  Now in PT still feels pretty weak  Had cauda equina syndrome  Pain now 2/10  Wife concern about  Ongoing rashes getting worse   Uses clobetasol for from Derm    Left knee upper back and some times other  No fever    Shaking when tired  And at end of day including shaking in abd muscle.  Some names slow memory but no being lost calculation other   Gait  En mass but right leg is some weakness no falling   Sleep some snoring  But has very frequent  Nocturia and some  Urge incontinence  So poor sleep patient declining to go back to  Urology at this time  Had myrbetric   Voice still "bad weak hoarse.    Had ent check in past and some kind of vocal chord split   Neg fam hx of parkinson's dementia Is on red yeast rice but not recently  Hearing and vision a bit worse   Wears reading glasses   ROS: See pertinent positives and negatives per HPI.  Past Medical History:  Diagnosis Date  . Abdominal pain, right lateral 02/21/2012   progressive.    . ALLERGIC RHINITIS 07/23/2007  . Allergy   . Arthritis   . Atrial fibrillation ([place]) 07/06/2009  . Atypical mole 09/13/1992   left shoulder Dr Cheryln Manly exc  . Atypical nevi 10/08/1992   right abdomen Dr Francisca December  . BACK PAIN 11/02/2009  . BACK PAIN, LUMBAR 10/25/2010  . Basal cell carcinoma 08/06/1992   right post thigh Dr Cheryln Manly  . BCC (basal cell carcinoma of skin) 04/23/2018   left post shoulder tx with bx  . BENIGN PROSTATIC HYPERTROPHY 07/25/2007  . 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 CAD 01/22/2009  . Cancer ([place])    skin  . 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  . Cataract   . COLONIC POLYPS, HX OF 10/18/2007  . Cough 10/25/2010  . DIZZINESS 01/22/2009  . DYSPNEA ON EXERTION 08/21/2008  . ECHOCARDIOGRAM, ABNORMAL 08/19/2007  . GERD 07/25/2007  . Heart murmur   .  HERNIA 07/06/2009  . HX, PERSONAL, MALIGNANCY, SKIN NEC 10/18/2007  . HYPERGLYCEMIA, BORDERLINE 12/31/2007  . HYPERLIPIDEMIA 07/25/2007  . HYPERTENSION 07/23/2007  . Inguinal hernia   . LUMBAR RADICULOPATHY, RIGHT 10/18/2007  . MUSCLE PAIN 01/22/2009  . Nausea   . NEURITIS 10/25/2010  . Palpitations 05/09/2010  . RHINITIS, CHRONIC 05/11/2010  . RUQ PAIN 01/22/2009  . Sinus polyp   . UNS ADVRS EFF OTH RX MEDICINAL&BIOLOGICAL SBSTNC 04/15/2008  . 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 VITAMIN D DEFICIENCY 11/02/2009  . Voice strain     Family History  Problem Relation Age of Onset  . Lung cancer Mother   . Cancer Mother   . Heart attack Father   . Hypertension Father   . Alcohol abuse Father   . Heart disease Father   . Breast cancer Sister   . Cancer Sister        breast  . Alcohol abuse Brother   . Hypertension Brother   . Alcohol abuse Brother   . Hypertension Brother   . Alcohol abuse Brother   . Hypertension Brother   . Kidney disease Sister   . Colon cancer Neg Hx   .  Esophageal cancer Neg Hx   . Rectal cancer Neg Hx   . Stomach cancer Neg Hx     Social History   Socioeconomic History  . Marital status: Married    Spouse name: Not on file  . Number of children: Not on file  . Years of education: Not on file  . Highest education level: Not on file  Occupational History  . Not on file  Tobacco Use  . Smoking status: Former Smoker    Types: Cigarettes    Quit date: 04/02/1964    Years since quitting: 57.0  . Smokeless tobacco: Never Used  . Tobacco comment: in early 20's he quit  Vaping Use  . Vaping Use: Never used  Substance and Sexual Activity  . Alcohol use: Yes    Comment: socially  . Drug use: No  . Sexual activity: Not on file  Other Topics Concern  . Not on file  Social History Narrative   Retired [employer]   Married   Non smoker some etoh   Travels a lot            Social Determinants of Radio broadcast assistant Strain: Low Risk   . Difficulty  of Paying Living Expenses: Not hard at all  Food Insecurity: No Food Insecurity  . Worried About Charity fundraiser in the Last Year: Never true  . Ran Out of Food in the Last Year: Never true  Transportation Needs: No Transportation Needs  . Lack of Transportation (Medical): No  . Lack of Transportation (Non-Medical): No  Physical Activity: Inactive  . Days of Exercise per Week: 0 days  . Minutes of Exercise per Session: 0 min  Stress: No Stress Concern Present  . Feeling of Stress : Not at all  Social Connections: Moderately Integrated  . Frequency of Communication with Friends and Family: Once a week  . Frequency of Social Gatherings with Friends and Family: Twice a week  . Attends Religious Services: More than 4 times per year  . Active Member of Clubs or Organizations: No  . Attends Archivist Meetings: Never  . Marital Status: Married    Outpatient Medications Prior to Visit  Medication Sig Dispense Refill  . acetaminophen (TYLENOL) 500 MG tablet Take 500 mg by mouth in the morning and at bedtime.    . Ascorbic Acid (VITAMIN C PO) Take by mouth.    Marland Kitchen aspirin 81 MG tablet Take 81 mg by mouth daily.    . Cholecalciferol (VITAMIN D3) 125 MCG (5000 UT) CAPS Take 1 capsule (5,000 Units total) by mouth daily. 90 capsule 3  . clobetasol (OLUX) 0.05 % topical foam APPLY to back and leg 50 g 10  . Coenzyme Q10 100 MG TABS Take 1 tablet by mouth daily.    . Cyanocobalamin (B-12) 100 MCG TABS Take by mouth.    . DHA-EPA-Vitamin E (OMEGA-3 COMPLEX PO) Take 1,200 mg by mouth 3 (three) times daily. ProOmega LDL    . magnesium oxide (MAG-OX) 400 MG tablet Take 400 mg daily by mouth.    . metoprolol tartrate (LOPRESSOR) 50 MG tablet TAKE 1/2 TABLET BY MOUTH IN THE MORNING AND 1 TABLET BY MOUTH IN THE EVENIG 135 tablet 2  . metroNIDAZOLE (METROCREAM) 0.75 % cream Apply to face 45 g 0  . MULTIPLE VITAMIN PO Take 1 tablet by mouth daily.    Marland Kitchen MYRBETRIQ 25 MG TB24 tablet Take 25 mg by  mouth daily.  0  .  nitroGLYCERIN (NITROSTAT) 0.4 MG SL tablet PLACE 1 TABLET UNDER TONGUE EVERY 5 MINUTES AS NEEDED FOR CHEST PAIN 25 tablet 3  . Omega-3 Fatty Acids (FISH OIL ADULT GUMMIES PO) Take 1 tablet by mouth daily.    . RABEprazole (ACIPHEX) 20 MG tablet TAKE 1 TABLET BY MOUTH EVERY DAY 90 tablet 0  . Red Yeast Rice 600 MG CAPS Take 1 capsule by mouth daily.    . tamsulosin (FLOMAX) 0.4 MG CAPS capsule TAKE 1 CAPSULE BY MOUTH EVERY DAY 90 capsule 0   Facility-Administered Medications Prior to Visit  Medication Dose Route Frequency Provider Last Rate Last Admin  . 0.9 %  sodium chloride infusion  500 mL Intravenous Continuous Milus Banister, MD         EXAM:  BP 116/66 (BP Location: Left Arm, Patient Position: Sitting, Cuff Size: Large)   Pulse 71   Temp 97.7 F (36.5 C) (Oral)   Ht 5' 7.5" (1.715 m)   Wt 204 lb 6.4 oz (92.7 kg)   SpO2 96%   BMI 31.54 kg/m   Body mass index is 31.54 kg/m.  GENERAL: vitals reviewed and listed above, alert, oriented, appears well hydrated and in no acute distress HEENT: atraumatic, conjunctiva  clear, no obvious abnormalities on inspection of external nose and ears OP : masked tm clear  Speech nl  Raspy   NECK: no obvious masses on inspection palpation  LUNGS: clear to auscultation bilaterally, no wheezes, rales or rhonchi, good air movement CV: HRRR, no clubbing cyanosis or asgiht 1+ edema  ( old)  nl cap refill  MS: moves all extremities without noticeable focal  Abnormality Walks gingerly right leg  Favoring.  Leans over some  Kyphosis   No tremor  Some stiffness  But not definite  cog wheeling  Turns  en mass walks with cane support  oriented x 3  Skin patchess of red rash upper back  Left knee   Extensor surgace and right elbow   Left abd flank skin cyst  About 2 cm no redness  No fluctuant PSYCH: pleasant and cooperative, no obvious depression or anxiety Lab Results  Component Value Date   WBC 7.6 03/28/2021   HGB 15.6  03/28/2021   HCT 44.9 03/28/2021   PLT 250.0 03/28/2021   GLUCOSE 130 (H) 03/28/2021   CHOL 180 10/13/2019   TRIG 126 10/13/2019   HDL 38 (L) 10/13/2019   LDLDIRECT 187.2 03/31/2008   LDLCALC 119 (H) 10/13/2019   ALT 19 10/13/2019   AST 13 10/13/2019   NA 141 03/28/2021   K 4.1 03/28/2021   CL 106 03/28/2021   CREATININE 0.76 03/28/2021   BUN 17 03/28/2021   CO2 25 03/28/2021   TSH 1.49 03/28/2021   PSA 1.49 02/21/2019   INR 1.1 (H) 01/25/2012   HGBA1C 6.0 03/28/2021   BP Readings from Last 3 Encounters:  03/28/21 116/66  09/27/20 112/68  09/14/20 130/78  last cmp ok jan  Wbc up  From pred?   ASSESSMENT AND PLAN:  Discussed the following assessment and plan:  Shakiness - Plan: Basic metabolic panel, Vitamin W54, TSH, T4, free, CBC with Differential/Platelet, Hemoglobin A1c, Hemoglobin A1c, CBC with Differential/Platelet, T4, free, TSH, Vitamin O27, Basic metabolic panel, Ambulatory referral to Neurology  Gait disturbance - Plan: Basic metabolic panel, Vitamin O35, TSH, T4, free, CBC with Differential/Platelet, Hemoglobin A1c, Hemoglobin A1c, CBC with Differential/Platelet, T4, free, TSH, Vitamin K09, Basic metabolic panel, Ambulatory referral to Neurology  Medication management - Plan:  Basic metabolic panel, Vitamin Y51, TSH, T4, free, CBC with Differential/Platelet, Hemoglobin A1c, Hemoglobin A1c, CBC with Differential/Platelet, T4, free, TSH, Vitamin G33, Basic metabolic panel  Elevated blood sugar - Plan: Basic metabolic panel, Vitamin P82, TSH, T4, free, CBC with Differential/Platelet, Hemoglobin A1c, Hemoglobin A1c, CBC with Differential/Platelet, T4, free, TSH, Vitamin P18, Basic metabolic panel  Rash and nonspecific skin eruption - poss psoriasis?   Urge incontinence  Snoring  History of back surgery x 2 - cauda equina updated lab  And consult   This could be  Decline after need for 2 back surgerys with residual effect and  Mobility change but  Agree that more  evaluation  Is indicated . Rash  Could be psoriatic   And may need derm  Fu  For other interventions .  Sleep and exhaustion from nocutria and or sleep apnea  Consideration of eval for sleep ?   See neurology first .  -Patient advised to return or notify health care team  if  new concerns arise. Review   Visit  Counsel and plan  45 minutes  Patient Instructions  The skin area may be  Psoriasis or other and  Needs reassessment by dermatologist .  . Please make appt   The shaking could be left over weakness from the  Back and surgical needs  And hopefully will improve   Had blood test in   Hospital was ok  .  Probabaly  Not  parkinsons  But  Understand concerns   May better with    Rehabilitation. .     WIll  plan a referral to neurology  To check for movement disorder .  And  Memory concerns . ? Sleep problem   ? If could have  Sleep apnea . ?             Standley Brooking. Sirr Kabel M.D.

## 2021-03-29 ENCOUNTER — Encounter: Payer: Self-pay | Admitting: Neurology

## 2021-03-29 DIAGNOSIS — Z9889 Other specified postprocedural states: Secondary | ICD-10-CM | POA: Diagnosis not present

## 2021-03-29 DIAGNOSIS — M47816 Spondylosis without myelopathy or radiculopathy, lumbar region: Secondary | ICD-10-CM | POA: Diagnosis not present

## 2021-03-29 DIAGNOSIS — G20 Parkinson's disease: Secondary | ICD-10-CM | POA: Diagnosis not present

## 2021-03-30 NOTE — Progress Notes (Signed)
Blood results stable  no further explanation for  problems   thyroid nl  b12 high range  blood sugar  borderline but no diabetes  Proceed with neurology  referral

## 2021-03-31 ENCOUNTER — Encounter: Payer: Self-pay | Admitting: Physician Assistant

## 2021-03-31 ENCOUNTER — Ambulatory Visit (INDEPENDENT_AMBULATORY_CARE_PROVIDER_SITE_OTHER): Payer: Medicare Other | Admitting: Physician Assistant

## 2021-03-31 ENCOUNTER — Other Ambulatory Visit: Payer: Self-pay

## 2021-03-31 DIAGNOSIS — L719 Rosacea, unspecified: Secondary | ICD-10-CM | POA: Diagnosis not present

## 2021-03-31 DIAGNOSIS — G20 Parkinson's disease: Secondary | ICD-10-CM | POA: Diagnosis not present

## 2021-03-31 DIAGNOSIS — R21 Rash and other nonspecific skin eruption: Secondary | ICD-10-CM | POA: Diagnosis not present

## 2021-03-31 DIAGNOSIS — Z9889 Other specified postprocedural states: Secondary | ICD-10-CM | POA: Diagnosis not present

## 2021-03-31 DIAGNOSIS — M47816 Spondylosis without myelopathy or radiculopathy, lumbar region: Secondary | ICD-10-CM | POA: Diagnosis not present

## 2021-03-31 MED ORDER — CLOBETASOL PROPIONATE 0.05 % EX FOAM: CUTANEOUS | 10 refills | Status: DC

## 2021-03-31 MED ORDER — CLOBETASOL PROP EMOLLIENT BASE 0.05 % EX CREA: TOPICAL_CREAM | CUTANEOUS | 2 refills | Status: DC

## 2021-03-31 MED ORDER — METRONIDAZOLE 0.75 % EX CREA
TOPICAL_CREAM | CUTANEOUS | 0 refills | Status: DC
Start: 2021-03-31 — End: 2021-04-26

## 2021-03-31 NOTE — Progress Notes (Signed)
   Follow-Up Visit   Subjective  ROI JAFARI is a 80 y.o. male who presents for the following: Rash (Per patient he has a rash on back and left knee, skin became flaky, x months, per patient extremely itchy, is starting to spread x 2 months to ear and to scalp. He has previously used clobetasol foam that "helped a little bit." Records were reviewed to see if any of his medications could be causative. None were identified. His blood work including his TSH were within normal limits as of 03/28/2021.  Rash under eyes x years, metrocream .75% "worked really well." Needs rx refill. Doesn't usually get bumps with it.   Patient has history of bcc and atypical moles. Per patient he had spinal surgery last June. ).   The following portions of the chart were reviewed this encounter and updated as appropriate:  Tobacco  Allergies  Meds  Problems  Med Hx  Surg Hx  Fam Hx      Objective  Well appearing patient in no apparent distress; mood and affect are within normal limits.  A focused examination was performed including: back, scalp,  extremities, including the arms, hands, fingers, and fingernails and the legs, feet, toes, and toenails. Relevant physical exam findings are noted in the Assessment and Plan.  Objective  Left Knee - Anterior, left elbow and scalp: Knees -pink papules coalescing into a plaque. Back- 2x5 cm raised pink plaque. Left elbow and scalp- small area of erythema and papules.   Assessment & Plan  Rash and other nonspecific skin eruption (2) left elbow and scalp; Left Knee - Anterior  Patient was advised to attempt to decrease rubbing and picking.  Anaerobic and Aerobic Culture - Left Knee - Anterior, left elbow and scalp  Clobetasol Prop Emollient Base (CLOBETASOL PROPIONATE E) 0.05 % emollient cream - Left Knee - Anterior, left elbow and scalp  clobetasol (OLUX) 0.05 % topical foam - Left Knee - Anterior, left elbow and scalp  Rosacea (4) Left Malar Cheek;  Right Malar Cheek; Left Buccal Cheek; Right Buccal Cheek  Other Related Medications metroNIDAZOLE (METROCREAM) 0.75 % cream   I, Zaryia Markel, PA-C, have reviewed all documentation's for this visit.  The documentation on 03/31/21 for the exam, diagnosis, procedures and orders are all accurate and complete.

## 2021-04-01 ENCOUNTER — Telehealth: Payer: Self-pay

## 2021-04-01 NOTE — Progress Notes (Signed)
Assessment/Plan:   1. 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  We discussed treatment options as well as prognostic indicators.  Patient education was provided.  -Greater than 50% of the 60 minute visit was spent in counseling answering questions and talking about what to expect now as well as in the future.  We talked about medication options as well as potential future surgical options.  We talked about safety in the home.  -We decided to add carbidopa/levodopa 25/100.  1/2 tab tid x 1 wk, then 1/2 in am & noon & 1 at night for a week, then 1/2 in am &1 at noon &night for a week, then 1 po tid.  Risks, benefits, side effects and alternative therapies were discussed.  The opportunity to ask questions was given and they were answered to the best of my ability.  The patient expressed understanding and willingness to follow the outlined treatment protocols.  -I was going to refer him to PT, but he is currently going to [facility].  Going to send order to [facility] for addition of ST and PT for Parkinsons Disease.   -We discussed community resources in the area including patient support groups and community exercise programs for PD and pt education was provided to the patient.   2.  Hx of BCC and hx of melanoma  -discussed that Parkinsons Disease increases risk of melanoma.  He just saw derm a few days ago.  Subjective:   [name] was seen today in the movement disorders clinic for neurologic consultation at the request of Panosh, Standley Brooking, MD.  The consultation is for the evaluation of "shaking when tired, gait en mass, voice bad weak hoarse."  The evaluation is to rule out Parkinson's disease   Specific Symptoms:  Tremor: Yes.   , pt states that it is "jumping" in the abdominal  wall.  No tremor in the arms/legs Family hx of similar:  No. Voice: yes, saw ENT x 8 months ago for weakness and told everything looked ok Sleep: trouble staying asleep  Vivid Dreams:  No.  Acting out dreams:  No. Wet Pillows: Yes.   Postural symptoms:  Yes.   patient recently had low back surgery, in January.  He did have cauda equina syndrome.  Pt reports balance been off for years.  Falls?  No. Bradykinesia symptoms: shuffling gait, slow movements and difficulty getting out of a chair Loss of smell:  Yes.   , attributes to working as firefighter Loss of taste:  Yes.   Urinary Incontinence:  Yes.  , can't get to RR fast enough since back sx.  On flomax and it helps. Difficulty Swallowing:  Yes.  , attributes to "thick saliva" Handwriting, micrographia: Yes.   Trouble with ADL's:  No.  Trouble buttoning clothing: No. Depression:  no Memory changes:  No. Hallucinations:  No.  visual distortions: No. N/V:  Some nausea Lightheaded:  Yes.   , some  Syncope: No. Diplopia:  Yes.  , unsure if monocular or binocular Dyskinesia:  No. Prior exposure to reglan/antipsychotics: No.  Neuroimaging of the brain has not previously been performed.    ALLERGIES:   Allergies  Allergen Reactions  . Propoxyphene N-Acetaminophen Nausea And Vomiting  . Iodinated Diagnostic Agents Swelling    Pt developed slight lt upper  lip swelling only to one side about 15 minutes post IV contrast injection. No other symptoms.     CURRENT MEDICATIONS:  Current Outpatient Medications  Medication Instructions  . acetaminophen (TYLENOL) 500 mg, Oral, 2 times daily  . Ascorbic Acid (VITAMIN C PO) Oral  . aspirin 81 mg, Oral, Daily,    . clobetasol (OLUX) 0.05 % topical foam APPLY to back and leg  . Coenzyme Q10 100 MG TABS 1 tablet, Oral, Daily  . Cyanocobalamin (B-12) 100 MCG TABS 1 tablet, Oral, Once every three days  . DHA-EPA-Vitamin E (OMEGA-3 COMPLEX PO) 1,200 mg, Oral, 3 times daily, ProOmega LDL   .  magnesium oxide (MAG-OX) 400 mg, Oral, Daily  . metoprolol tartrate (LOPRESSOR) 50 MG tablet TAKE 1/2 TABLET BY MOUTH IN THE MORNING AND 1 TABLET BY MOUTH IN THE EVENIG  . metroNIDAZOLE (METROCREAM) 0.75 % cream Apply to face  . MULTIPLE VITAMIN PO 1 tablet, Oral, Daily  . Myrbetriq 25 mg, Oral, Daily  . nitroGLYCERIN (NITROSTAT) 0.4 MG SL tablet PLACE 1 TABLET UNDER TONGUE EVERY 5 MINUTES AS NEEDED FOR CHEST PAIN  . RABEprazole (ACIPHEX) 20 MG tablet TAKE 1 TABLET BY MOUTH EVERY DAY  . Red Yeast Rice 600 MG CAPS 1 capsule, Oral, Daily  . tamsulosin (FLOMAX) 0.4 MG CAPS capsule TAKE 1 CAPSULE BY MOUTH EVERY DAY  . Vitamin D3 5,000 Units, Oral, Daily    Objective:   VITALS:   Vitals:   04/05/21 1331  BP: 124/70  Pulse: 76  SpO2: 94%  Weight: 203 lb (92.1 kg)  Height: 5' 7.5" (1.715 m)    GEN:  The patient appears stated age and is in NAD. HEENT:  Normocephalic, atraumatic.  The mucous membranes are moist. The superficial temporal arteries are without ropiness or tenderness. CV:  RRR Lungs:  CTAB Neck/HEME:  There are no carotid bruits bilaterally.  Neck is forward flexed.    Neurological examination:  Orientation: The patient is alert and oriented x3.  Cranial nerves: There is good facial symmetry. There is facial hypomimia.  Extraocular muscles are intact. The visual fields are full to confrontational testing. The speech is fluent and clear. He is hypophonic.  Soft palate rises symmetrically and there is no tongue deviation. Hearing is intact to conversational tone. Sensation: Sensation is intact to light and pinprick throughout (facial, trunk, extremities). Vibration is intact at the bilateral big toe. There is no extinction with double simultaneous stimulation. There is no sensory dermatomal level identified. Motor: Strength is 5/5 in the bilateral upper and lower extremities.   Shoulder shrug is equal and symmetric.  There is no pronator drift. Deep tendon reflexes: Deep tendon  reflexes are 2/4 at the bilateral biceps, triceps, brachioradialis, patella and achilles. Plantar responses are downgoing bilaterally.  Movement examination: Tone: There is mild increased tone in the RUE Abnormal movements: none Coordination:  There is decremation with RAM's, with any form of RAMS, including alternating supination and pronation of the forearm, hand opening and closing, finger taps, heel taps and toe taps, R>L Gait and Station: The patient has difficulty arising out of a deep-seated chair without the use of the hands. The patient's stride length is decreased.  He is flexed at the waist.  Slightly drags the R leg.   I have reviewed and interpreted the following labs independently   Chemistry      Component Value Date/Time   NA 141 03/28/2021 1430   NA 141 07/14/2020 1407   K 4.1 03/28/2021 1430  CL 106 03/28/2021 1430   CO2 25 03/28/2021 1430   BUN 17 03/28/2021 1430   BUN 15 07/14/2020 1407   CREATININE 0.76 03/28/2021 1430      Component Value Date/Time   CALCIUM 9.8 03/28/2021 1430   ALKPHOS 72 10/13/2019 1523   AST 13 10/13/2019 1523   ALT 19 10/13/2019 1523   BILITOT 0.6 10/13/2019 1523      Lab Results  Component Value Date   TSH 1.49 03/28/2021   Lab Results  Component Value Date   WBC 7.6 03/28/2021   HGB 15.6 03/28/2021   HCT 44.9 03/28/2021   MCV 93.5 03/28/2021   PLT 250.0 03/28/2021     Total time spent on today's visit was 45 minutes, including both face-to-face time and nonface-to-face time.  Time included that spent on review of records (prior notes available to me/labs/imaging if pertinent), discussing treatment and goals, answering patient's questions and coordinating care.  Cc:  Panosh, Standley Brooking, MD

## 2021-04-01 NOTE — Telephone Encounter (Signed)
Results mailed to patient on 04/08.

## 2021-04-01 NOTE — Telephone Encounter (Signed)
Patient has requested a copy of latest lab results to be mailed to his home.

## 2021-04-01 NOTE — Progress Notes (Signed)
Ok to send copy to his  home . Can decrease b12 to  every other day 3 days per week. Is ok

## 2021-04-04 DIAGNOSIS — Z9889 Other specified postprocedural states: Secondary | ICD-10-CM | POA: Diagnosis not present

## 2021-04-04 DIAGNOSIS — G20 Parkinson's disease: Secondary | ICD-10-CM | POA: Diagnosis not present

## 2021-04-04 DIAGNOSIS — M47816 Spondylosis without myelopathy or radiculopathy, lumbar region: Secondary | ICD-10-CM | POA: Diagnosis not present

## 2021-04-05 ENCOUNTER — Ambulatory Visit (INDEPENDENT_AMBULATORY_CARE_PROVIDER_SITE_OTHER): Payer: Medicare Other | Admitting: Neurology

## 2021-04-05 ENCOUNTER — Other Ambulatory Visit: Payer: Self-pay

## 2021-04-05 ENCOUNTER — Encounter: Payer: Self-pay | Admitting: Neurology

## 2021-04-05 VITALS — BP 124/70 | HR 76 | Ht 67.5 in | Wt 203.0 lb

## 2021-04-05 DIAGNOSIS — G20 Parkinson's disease: Secondary | ICD-10-CM

## 2021-04-05 DIAGNOSIS — G20A1 Parkinson's disease without dyskinesia, without mention of fluctuations: Secondary | ICD-10-CM | POA: Insufficient documentation

## 2021-04-05 MED ORDER — CARBIDOPA-LEVODOPA 25-100 MG PO TABS: 1.0000 | ORAL_TABLET | Freq: Three times a day (TID) | ORAL | 1 refills | Status: DC

## 2021-04-05 NOTE — Patient Instructions (Signed)
Start Carbidopa Levodopa as follows:  Take 1/2 tablet three times daily, at least 30 minutes before meals (approximately 9am/1pm/5pm), for one week  Then take 1/2 tablet in the morning, 1/2 tablet in the afternoon, 1 tablet in the evening, at least 30 minutes before meals, for one week  Then take 1/2 tablet in the morning, 1 tablet in the afternoon, 1 tablet in the evening, at least 30 minutes before meals, for one week  Then take 1 tablet three times daily at 9am/1pm/5pm, at least 30 minutes before meals   As a reminder, carbidopa/levodopa can be taken at the same time as a carbohydrate, but we like to have you take your pill either 30 minutes before a protein source or 1 hour after as protein can interfere with carbidopa/levodopa absorption.  Online Resources for Power over Parkinson's Group March 2022  . Local [place] Online Groups  o Power over [employer] Group :   - Power Over Parkinson's Patient Education Group will be Wednesday, March 9th at 2pm via Zoom.   - Upcoming Power over Parkinson's Meetings:  2nd Wednesdays of the month at 2 pm:       April 13th, May 11th - Contact Amy Marriott at [email or web address] if interested in participating in this online group o Parkinson's Care Partners Group:    3rd Mondays, Contact Corwin Levins o Atypical Parkinsonian Patient Group:   4th Wednesdays, Contact Corwin Levins o If you are interested in participating in these online groups with Judson Roch, please contact her directly for how to join those meetings.  Her contact information is [email or web address].  She will send you a link to join the [employer].  (Please note that Corwin Levins , MSW, LCSW, has resigned her position at [facility], but will continue to lead the online groups temporarily)  . [place]:  [email or web address] o PD Health at Home continues:  Mindfulness Mondays, Expert Briefing Tuesdays, Wellness Wednesdays, Take Time Thursdays, Fitness  Fridays -Listings for March 2022 are on the website o Upcoming Webinar:  Conversations about Complementary Therapies and PD.  Wednesday, March 2nd @ 1 pm o Upcoming Webinar:  Can We Put the Brakes on PD Progression?  Wednesday, April 6th @ 1 pm o Geneticist, molecular) at [email or web address] o  Please check out their website to sign up for emails and see their full online offerings  . [place]:  [email or web address]  o Upcoming Webinar:   Trouble Sleeping?  What to Know about Acting out Dreams and other Sleep Issues.  Thursday, March 17th @ 12 noon o Check out additional information on their website to see their full online offerings  . [place]:  [email or web address] o Upcoming Webinar:  Women and Parkinson's.  Tuesday, March 8th at 2 pm o Care Partner Monthly Meetup.  With 11-18-1995 [place].  First Tuesday of each month, 2 pm o Check out additional information to Live Well Today on their website  . Parkinson and Movement Disorders (PMD) Alliance:  www.pmdalliance.org o [employer] Online:  Online Education Events o Sign up for emails, which are sent weekly to give you updates on programming and online offerings   . Parkinson's Association of the [place]:  www.parkinsonassociation.org o Information on online support groups, education events, and online exercises including Yoga, Parkinson's exercises and more-LOTS of information on links to PD resources and online events o Virtual Support Group through Parkinson's Association of the [place]; next one is scheduled for Wednesday, March 30, 2021 at 2 pm. (These  are typically scheduled for the 1st Wednesday of the month at 2 pm).  Visit website for details.  . Additional links for movement activities: o PWR! Moves Classes at [place] RESUMED!  Wednesdays 10 and 11 am.  Contact Amy Marriott, PT [email or web address] or [phone] if  interested o Here is a link to the PWR!Moves classes on Zoom from [place] - Daily Mon-Sat at 10:00. Via Zoom, FREE and open to all.  There is also a link below via Facebook if you use that platform. - [email or web address].si - [email or web address]://[email or web address].no o Parkinson's Wellness Recovery (PWR! Moves)  www.pwr4life.org - Info on the PWR! Virtual Experience:  You will have access to our expertise through self-assessment, guided plans that start with the PD-specific fundamentals, educational content, tips, Q&A with an expert, and a growing Art therapist of PD-specific pre-recorded and live exercise classes of varying types and intensity - both physical and cognitive! If that is not enough, we offer 1:1 wellness consultations (in-person or virtual) to personalize your PWR! Research scientist (medical).  - Check out the PWR! Move of the month on the [place] [facility] website:  [email or web address] o [employer] Fridays:  - As part of the PD Health @ Home program, this free video series focuses each week on one aspect of fitness designed to support people living with Parkinson's.  These weekly videos highlight the [place] recent fitness guidelines for people with Parkinson's disease. -  HollywoodSale.dk o Dance for PD website is offering free, live-stream classes throughout the week, as well as links to [employer] of classes:  [email or web address]://[email or web address] o Dance for Parkinson's Class:  [place].  Free offering for people with Parkinson's and care partners; virtual class.  o For more information, contact [phone] or email Ruffin Frederick at  [email or web address] o Virtual dance and Pilates for Parkinson's classes: Click on the Community Tab> Parkinson's Movement Initiative Tab.  To register for classes and for more information, visit www.[email or web address] and click the "community" tab.     o [employer] Parkinson's Cycling Classes  - [facility]: 1pm on Fridays-Live classes at [facility] [employer] at [email or web address] or [phone]) [facility] [employer] [employer]: Virtual Classes Mondays and Thursdays (contact [place] at [email or web address] or [phone])   o [place] levels of classes are offered Tuesdays and Thursdays:  10:30 am,  12 noon & 1:45 pm at [facility].  - Active Stretching with Paula Compton Class starting in March, on Fridays - To observe a class or for  more information, call [phone] or email [email or web address] . Well[employer]Spring [employer]: o Chief Technology Officer Opportunities:  www.well-springsolutions.org/caregiver-education/caregiver-support-group.  You may also contact Vickki Muff at [email or web address] or 3123846243.   o Virtual Caregiver Retreat, Monday, February 21st, 3-5 pm o Powerful Tools for Caregivers, 6-wk series for caregivers, in-person, Thursdays March 10, 17, 24, 31 and April 7, 14, from 10:30-12:30 at 11-10-1974, [place].  Contact [address] (see above) to register o Well-Spring Navigator:  Just1Navigator program, a free service to help individuals and families through the journey of determining care for older adults.  The "Navigator" is a Vickki Muff, Education officer, museum, who will speak with a prospective client and/or loved ones to provide an assessment of the situation and a set of recommendations for a personalized care plan -- all free of charge, and whether Well-Spring [employer] offers the needed service or not. If the need is not a service we provide, we are  well-connected with  reputable programs in town that we can refer you to.  www.well-springsolutions.org or to speak with the Navigator, call [phone].

## 2021-04-06 ENCOUNTER — Telehealth: Payer: Self-pay | Admitting: Cardiovascular Disease

## 2021-04-06 DIAGNOSIS — Z9889 Other specified postprocedural states: Secondary | ICD-10-CM | POA: Diagnosis not present

## 2021-04-06 DIAGNOSIS — M47816 Spondylosis without myelopathy or radiculopathy, lumbar region: Secondary | ICD-10-CM | POA: Diagnosis not present

## 2021-04-06 DIAGNOSIS — G20 Parkinson's disease: Secondary | ICD-10-CM | POA: Diagnosis not present

## 2021-04-06 LAB — ANAEROBIC AND AEROBIC CULTURE
MICRO NUMBER:: 11743254
MICRO NUMBER:: 11743255
SPECIMEN QUALITY:: ADEQUATE
SPECIMEN QUALITY:: ADEQUATE

## 2021-04-06 NOTE — Telephone Encounter (Signed)
Returned call to patient's wife who states she is concerned about patient's BP getting too low in the setting of recent diagnosis of Parkinson's. She wants to know if the patient should stop taking metoprolol. I advised her that the metoprolol is not used as an anti-hypertensive although it has mild blood pressure lowering effects and that with his history of ischemic heart disease, we should try to keep it on board as long as the patient tolerates it. I advised her to continue to monitor patient's BP as he starts and increases his carbidopa-levodopa and to also monitor for weakness, fatigue, lightheadedness, dizziness, or instability. I advised her to call back if patient develops any symptoms or BP readings that are concerning to her. She verbalized understanding and agreement and thanked me for my help.

## 2021-04-06 NOTE — Telephone Encounter (Signed)
   Pt c/o medication issue:  1. Name of Medication: metoprolol tartrate (LOPRESSOR) 50 MG tablet  2. How are you currently taking this medication (dosage and times per day)? 1/2 tablet in the morning, 1 tablet in the evening   3. Are you having a reaction (difficulty breathing--STAT)?   4. What is your medication issue? Wife wanted to know if she should make any adjustments to his medication.  The patient was diagnosed with Parkinson's by Dr. Carles Collet at [facility] yesterday. The Neurologist said to have the patient eat more salt to raise his BP. The wife was wondering if he should do that or if other changes to his medication should be made.   Please advise

## 2021-04-11 DIAGNOSIS — G20 Parkinson's disease: Secondary | ICD-10-CM | POA: Diagnosis not present

## 2021-04-11 DIAGNOSIS — Z9889 Other specified postprocedural states: Secondary | ICD-10-CM | POA: Diagnosis not present

## 2021-04-11 DIAGNOSIS — M47816 Spondylosis without myelopathy or radiculopathy, lumbar region: Secondary | ICD-10-CM | POA: Diagnosis not present

## 2021-04-14 DIAGNOSIS — M47816 Spondylosis without myelopathy or radiculopathy, lumbar region: Secondary | ICD-10-CM | POA: Diagnosis not present

## 2021-04-14 DIAGNOSIS — Z9889 Other specified postprocedural states: Secondary | ICD-10-CM | POA: Diagnosis not present

## 2021-04-14 DIAGNOSIS — G20 Parkinson's disease: Secondary | ICD-10-CM | POA: Diagnosis not present

## 2021-04-18 DIAGNOSIS — M47816 Spondylosis without myelopathy or radiculopathy, lumbar region: Secondary | ICD-10-CM | POA: Diagnosis not present

## 2021-04-18 DIAGNOSIS — G20 Parkinson's disease: Secondary | ICD-10-CM | POA: Diagnosis not present

## 2021-04-18 DIAGNOSIS — Z9889 Other specified postprocedural states: Secondary | ICD-10-CM | POA: Diagnosis not present

## 2021-04-19 ENCOUNTER — Telehealth: Payer: Self-pay | Admitting: Pharmacist

## 2021-04-19 NOTE — Chronic Care Management (AMB) (Addendum)
Chronic Care Management Pharmacy Assistant   Name: [name]  MRN: [id number] DOB: 08-20-1941  Reason for Encounter: General Adherence Call   Recent office visits:  . 04.04.2022 Panosh, Standley Brooking, MD (PCP) -follow-up multiple labs ordered referral place to neurology. A reassessment appointment was made with dermatology  Recent consult visits:  . 04.13.2022 [name], [name], DO Neurology-establish care (new diagnosis of Parkinson's disease) o Medication change o Carbidopa-Levodopa 25-100 MG 1 tablet Oral 3 times daily, 9 am/1pm/5pm . 04.07.2022 Warren Danes, PA-C Dermatology-[place] o Medications prescribed o Metronidazole (METROCREAM) 0.75 % cream . 03.10.2022 Leonie Douglas, MD neurosurgery- Status post lumbar spine surgery for decompression of spinal cord. Referral place to physical therapy . 02.10.2022 Zandra Abts, PA-C neurosurgery- Follow-up examination following surgery (Primary Dx);S/P laminectomy  Hospital visits:  None in previous 6 months  Medications: Outpatient Encounter Medications as of 04/19/2021  Medication Sig  . acetaminophen (TYLENOL) 500 MG tablet Take 500 mg by mouth in the morning and at bedtime.  . Ascorbic Acid (VITAMIN C PO) Take by mouth.  Marland Kitchen aspirin 81 MG tablet Take 81 mg by mouth daily.  . carbidopa-levodopa (SINEMET IR) 25-100 MG tablet Take 1 tablet by mouth 3 (three) times daily. 9am/1pm/5pm  . Cholecalciferol (VITAMIN D3) 125 MCG (5000 UT) CAPS Take 1 capsule (5,000 Units total) by mouth daily.  . clobetasol (OLUX) 0.05 % topical foam APPLY to back and leg  . Coenzyme Q10 100 MG TABS Take 1 tablet by mouth daily.  . Cyanocobalamin (B-12) 100 MCG TABS Take 1 tablet by mouth. Once every three days  . DHA-EPA-Vitamin E (OMEGA-3 COMPLEX PO) Take 1,200 mg by mouth 3 (three) times daily. ProOmega LDL  . magnesium oxide (MAG-OX) 400 MG tablet Take 400 mg daily by mouth.  . metoprolol tartrate (LOPRESSOR) 50 MG tablet TAKE 1/2 TABLET BY  MOUTH IN THE MORNING AND 1 TABLET BY MOUTH IN THE EVENIG (Patient taking differently: TAKE 1/2 TABLET BY MOUTH IN THE MORNING AND 1 TABLET BY MOUTH IN THE EVENING)  . metroNIDAZOLE (METROCREAM) 0.75 % cream Apply to face (Patient taking differently: Apply 1 application topically as needed. Apply to face)  . MULTIPLE VITAMIN PO Take 1 tablet by mouth daily.  Marland Kitchen MYRBETRIQ 25 MG TB24 tablet Take 25 mg by mouth daily.  . nitroGLYCERIN (NITROSTAT) 0.4 MG SL tablet PLACE 1 TABLET UNDER TONGUE EVERY 5 MINUTES AS NEEDED FOR CHEST PAIN  . RABEprazole (ACIPHEX) 20 MG tablet TAKE 1 TABLET BY MOUTH EVERY DAY  . Red Yeast Rice 600 MG CAPS Take 1 capsule by mouth daily.  . tamsulosin (FLOMAX) 0.4 MG CAPS capsule TAKE 1 CAPSULE BY MOUTH EVERY DAY   Facility-Administered Encounter Medications as of 04/19/2021  Medication  . 0.9 %  sodium chloride infusion   I spoke with the patient and discussed medication adherence. He did inform me of a new diagnosis of Parkinson's disease. He began taking Carbidopa-Levodopa 25-100 MG 1 tablet orally 3 times daily, 9 am/1 pm/5 pm. He what's on a low dose of this medication and will gradually increase to a full dose three times a day. He states that he is still having issues with incontinence. Some side effects he stated he noticed from this medication are Lightheadedness and fatigue, but it does not last long.  He has had no other side effects. He states that he is in physical therapy and speech therapy twice a week. There are no other changes to his medications. He  denies any ED visits or urgent care visits since his last CPP or PCP appointment. He denies any issues with his pharmacy.  Star Rating Drugs: None  Arlis Porta Revonda Standard, [place] [phone]

## 2021-04-21 ENCOUNTER — Telehealth: Payer: Self-pay | Admitting: Internal Medicine

## 2021-04-21 NOTE — Telephone Encounter (Signed)
The patient was diagnosed with Parkinson's Disease about 5 weeks ago and now he is light headed, dizziness, and very pale.   I transferred the patient's wife to nurse triage to see what they will suggest for the patient.

## 2021-04-22 ENCOUNTER — Telehealth: Payer: Self-pay | Admitting: Neurology

## 2021-04-22 NOTE — Telephone Encounter (Signed)
It doesn't get absorbed in 10 min.  Is he taking it with carbohydrate (applesauce is fine)?  If still not better I can change him to a CR version (still tid) and see how he does

## 2021-04-22 NOTE — Telephone Encounter (Signed)
Ask him to f/u with PCP.  It may be that my med is causing some BP drops but he is also on BP medication that can contribute (and Parkinsons Disease alone can do that).  Make sure he is drinking plenty of water - 60+oz per day

## 2021-04-22 NOTE — Telephone Encounter (Signed)
Spoke with pt wife she stated that then doing the 1/2 tab he was fine past 2 days being on the full tab he gets white, pain in stomach and dizzy ( happening 10 mins after taken his medication). Asking if the full tablet is to much?  They are waiting for the PCP to call back as well

## 2021-04-22 NOTE — Telephone Encounter (Signed)
New message    Pt c/o medication issue:  1. Name of Medication: carbidopa-levodopa (SINEMET IR) 25-100 MG tablet  2. How are you currently taking this medication (dosage and times per day)? Take 1 tablet by mouth 3 (three) times daily. 9am/1pm/5pm, Starting Tue 04/05/2021, Normal  3. Are you having a reaction (difficulty breathing--STAT)? No   4. What is your medication issue? Dizziness /  pressure on top head / nauseate  / feels like he going to pass out   5. Blood pressure  147/81 @ 11:00

## 2021-04-25 ENCOUNTER — Other Ambulatory Visit: Payer: Self-pay | Admitting: Physician Assistant

## 2021-04-25 ENCOUNTER — Telehealth: Payer: Self-pay | Admitting: Internal Medicine

## 2021-04-25 DIAGNOSIS — G20 Parkinson's disease: Secondary | ICD-10-CM | POA: Diagnosis not present

## 2021-04-25 DIAGNOSIS — Z9889 Other specified postprocedural states: Secondary | ICD-10-CM | POA: Diagnosis not present

## 2021-04-25 DIAGNOSIS — L719 Rosacea, unspecified: Secondary | ICD-10-CM

## 2021-04-25 DIAGNOSIS — M47816 Spondylosis without myelopathy or radiculopathy, lumbar region: Secondary | ICD-10-CM | POA: Diagnosis not present

## 2021-04-25 NOTE — Telephone Encounter (Signed)
Pt is doing much better, no need for change per wife.

## 2021-04-28 ENCOUNTER — Other Ambulatory Visit: Payer: Self-pay

## 2021-04-28 ENCOUNTER — Telehealth: Payer: Self-pay | Admitting: Cardiovascular Disease

## 2021-04-28 DIAGNOSIS — Z9889 Other specified postprocedural states: Secondary | ICD-10-CM | POA: Diagnosis not present

## 2021-04-28 DIAGNOSIS — G20 Parkinson's disease: Secondary | ICD-10-CM | POA: Diagnosis not present

## 2021-04-28 DIAGNOSIS — M47816 Spondylosis without myelopathy or radiculopathy, lumbar region: Secondary | ICD-10-CM | POA: Diagnosis not present

## 2021-04-28 MED ORDER — METOPROLOL TARTRATE 50 MG PO TABS: ORAL_TABLET | ORAL | 1 refills | Status: DC

## 2021-04-28 NOTE — Telephone Encounter (Signed)
Lopressor was refilled by patient's PCP today. Spoke with the patient's wife and advised her of this. She verbalized understanding.

## 2021-04-28 NOTE — Telephone Encounter (Signed)
Patient wife is calling and is requesting metoprolol tartrate (LOPRESSOR) 50 MG tablet to be sent to   [id number] [place], [address], [name] [address]  Phone:  [phone] Fax:  [phone]   Per wife is sure that provider would send a refill and wanted to ask pcp, please advise. CB is [phone].

## 2021-04-28 NOTE — Telephone Encounter (Addendum)
LVM for patient, medication has sent to [place]

## 2021-04-28 NOTE — Telephone Encounter (Signed)
*  STAT* If patient is at the pharmacy, call can be transferred to refill team.   1. Which medications need to be refilled? (please list name of each medication and dose if known)  metoprolol tartrate (LOPRESSOR) 50 MG tablet  2. Which pharmacy/location (including street and city if local pharmacy) is medication to be sent to?  [id number] [place], [place] - [address]  3. Do they need a 30 day or 90 day supply? [place]

## 2021-05-02 DIAGNOSIS — Z20822 Contact with and (suspected) exposure to covid-19: Secondary | ICD-10-CM | POA: Diagnosis not present

## 2021-05-05 DIAGNOSIS — G20 Parkinson's disease: Secondary | ICD-10-CM | POA: Diagnosis not present

## 2021-05-05 DIAGNOSIS — Z9889 Other specified postprocedural states: Secondary | ICD-10-CM | POA: Diagnosis not present

## 2021-05-05 DIAGNOSIS — M47816 Spondylosis without myelopathy or radiculopathy, lumbar region: Secondary | ICD-10-CM | POA: Diagnosis not present

## 2021-05-11 ENCOUNTER — Telehealth: Payer: Self-pay | Admitting: Neurology

## 2021-05-11 ENCOUNTER — Other Ambulatory Visit: Payer: Self-pay | Admitting: *Deleted

## 2021-05-11 DIAGNOSIS — G20 Parkinson's disease: Secondary | ICD-10-CM | POA: Diagnosis not present

## 2021-05-11 DIAGNOSIS — Z9889 Other specified postprocedural states: Secondary | ICD-10-CM | POA: Diagnosis not present

## 2021-05-11 DIAGNOSIS — M47816 Spondylosis without myelopathy or radiculopathy, lumbar region: Secondary | ICD-10-CM | POA: Diagnosis not present

## 2021-05-11 DIAGNOSIS — R1310 Dysphagia, unspecified: Secondary | ICD-10-CM

## 2021-05-11 NOTE — Telephone Encounter (Signed)
Pt agreed to have the test done. Ordered Modified Barium Swallow test to [employer]

## 2021-05-11 NOTE — Telephone Encounter (Signed)
Received email from speech therapist, Bary Castilla, [facility] to request modified barium swallow.  If patient is agreeable, please order that examination with diagnosis of dysphagia, Parkinsons Disease

## 2021-05-12 ENCOUNTER — Other Ambulatory Visit (HOSPITAL_COMMUNITY): Payer: Self-pay | Admitting: *Deleted

## 2021-05-12 DIAGNOSIS — R1310 Dysphagia, unspecified: Secondary | ICD-10-CM

## 2021-05-12 DIAGNOSIS — R059 Cough, unspecified: Secondary | ICD-10-CM

## 2021-05-13 DIAGNOSIS — M47816 Spondylosis without myelopathy or radiculopathy, lumbar region: Secondary | ICD-10-CM | POA: Diagnosis not present

## 2021-05-13 DIAGNOSIS — G20 Parkinson's disease: Secondary | ICD-10-CM | POA: Diagnosis not present

## 2021-05-13 DIAGNOSIS — Z9889 Other specified postprocedural states: Secondary | ICD-10-CM | POA: Diagnosis not present

## 2021-05-16 DIAGNOSIS — Z9889 Other specified postprocedural states: Secondary | ICD-10-CM | POA: Diagnosis not present

## 2021-05-16 DIAGNOSIS — M47816 Spondylosis without myelopathy or radiculopathy, lumbar region: Secondary | ICD-10-CM | POA: Diagnosis not present

## 2021-05-16 DIAGNOSIS — G20 Parkinson's disease: Secondary | ICD-10-CM | POA: Diagnosis not present

## 2021-05-18 ENCOUNTER — Ambulatory Visit (HOSPITAL_COMMUNITY)
Admission: RE | Admit: 2021-05-18 | Discharge: 2021-05-18 | Disposition: A | Discharge status: Home / Self Care | Payer: Medicare Other | Source: Ambulatory Visit | Attending: Neurology | Admitting: Neurology

## 2021-05-18 ENCOUNTER — Other Ambulatory Visit: Payer: Self-pay

## 2021-05-18 DIAGNOSIS — G20 Parkinson's disease: Secondary | ICD-10-CM | POA: Insufficient documentation

## 2021-05-18 DIAGNOSIS — R1310 Dysphagia, unspecified: Secondary | ICD-10-CM | POA: Insufficient documentation

## 2021-05-18 DIAGNOSIS — R059 Cough, unspecified: Secondary | ICD-10-CM | POA: Diagnosis not present

## 2021-05-18 DIAGNOSIS — T17320A Food in larynx causing asphyxiation, initial encounter: Secondary | ICD-10-CM | POA: Diagnosis not present

## 2021-05-18 NOTE — Progress Notes (Signed)
Modified Barium Swallow Progress Note  Patient Details  Name: JOSEDE CICERO MRN: [id number] Date of Birth: June 21, 1941  Today's Date: 05/18/2021  Modified Barium Swallow completed.  Full report located under Chart Review in the Imaging Section.  Brief recommendations include the following:  Clinical Impression  Pt demonstrates swallow function WNL for age today. He is able to drink consecutively via cup and straw,consume mixed consistency solid and liquid boluses and pill and liquid. Musculature was strong, airway protection timely though there were instances of trace flash penetration. Pt did not cough during study. Esophageal sweep appeared WNL. Pt did report globus after pill though there was only brief hesitation of the pill in the cervical esophagus. Recommend pt f/u with primary SLP for further education regarding results.   Swallow Evaluation Recommendations       SLP Diet Recommendations: Regular solids;Thin liquid   Liquid Administration via: Cup;Straw   Medication Administration: Whole meds with liquid   Supervision: Patient able to self feed   Compensations: Slow rate;Small sips/bites   Postural Changes: Seated upright at 90 degrees            Leisha Trinkle, Katherene Ponto 05/18/2021,1:04 PM

## 2021-05-19 ENCOUNTER — Other Ambulatory Visit: Payer: Self-pay | Admitting: Internal Medicine

## 2021-05-19 DIAGNOSIS — G20 Parkinson's disease: Secondary | ICD-10-CM | POA: Diagnosis not present

## 2021-05-19 DIAGNOSIS — Z9889 Other specified postprocedural states: Secondary | ICD-10-CM | POA: Diagnosis not present

## 2021-05-19 DIAGNOSIS — M47816 Spondylosis without myelopathy or radiculopathy, lumbar region: Secondary | ICD-10-CM | POA: Diagnosis not present

## 2021-05-20 ENCOUNTER — Other Ambulatory Visit: Payer: Self-pay

## 2021-05-20 MED ORDER — VITAMIN D3 125 MCG (5000 UT) PO CAPS: 5000.0000 [IU] | ORAL_CAPSULE | Freq: Every day | ORAL | 3 refills | Status: AC

## 2021-05-24 DIAGNOSIS — G20 Parkinson's disease: Secondary | ICD-10-CM | POA: Diagnosis not present

## 2021-05-24 DIAGNOSIS — Z9889 Other specified postprocedural states: Secondary | ICD-10-CM | POA: Diagnosis not present

## 2021-05-24 DIAGNOSIS — M47816 Spondylosis without myelopathy or radiculopathy, lumbar region: Secondary | ICD-10-CM | POA: Diagnosis not present

## 2021-06-02 DIAGNOSIS — M47816 Spondylosis without myelopathy or radiculopathy, lumbar region: Secondary | ICD-10-CM | POA: Diagnosis not present

## 2021-06-02 DIAGNOSIS — Z9889 Other specified postprocedural states: Secondary | ICD-10-CM | POA: Diagnosis not present

## 2021-06-02 DIAGNOSIS — G20 Parkinson's disease: Secondary | ICD-10-CM | POA: Diagnosis not present

## 2021-06-07 ENCOUNTER — Other Ambulatory Visit: Payer: Self-pay

## 2021-06-07 ENCOUNTER — Encounter: Payer: Self-pay | Admitting: Internal Medicine

## 2021-06-07 ENCOUNTER — Ambulatory Visit (INDEPENDENT_AMBULATORY_CARE_PROVIDER_SITE_OTHER): Payer: Medicare Other | Admitting: Internal Medicine

## 2021-06-07 VITALS — BP 140/86 | HR 74 | Temp 98.4°F | Ht 67.0 in | Wt 202.8 lb

## 2021-06-07 DIAGNOSIS — R319 Hematuria, unspecified: Secondary | ICD-10-CM | POA: Diagnosis not present

## 2021-06-07 DIAGNOSIS — R1030 Lower abdominal pain, unspecified: Secondary | ICD-10-CM | POA: Diagnosis not present

## 2021-06-07 DIAGNOSIS — R300 Dysuria: Secondary | ICD-10-CM | POA: Diagnosis not present

## 2021-06-07 LAB — POC URINALSYSI DIPSTICK (AUTOMATED)
Bilirubin, UA: NEGATIVE
Blood, UA: POSITIVE
Glucose, UA: NEGATIVE
Ketones, UA: NEGATIVE
Leukocytes, UA: NEGATIVE
Nitrite, UA: NEGATIVE
Protein, UA: POSITIVE — AB
Spec Grav, UA: 1.025 (ref 1.010–1.025)
Urobilinogen, UA: 0.2 E.U./dL
pH, UA: 6 (ref 5.0–8.0)

## 2021-06-07 MED ORDER — SULFAMETHOXAZOLE-TRIMETHOPRIM 800-160 MG PO TABS: 1.0000 | ORAL_TABLET | Freq: Two times a day (BID) | ORAL | 0 refills | Status: DC

## 2021-06-07 NOTE — Patient Instructions (Signed)
Begin antibiotic for possible infection causing the pain and blood urine.  I want you to see urology in follow up.   If not seen before then plan follow up status  at end of antibiotic.  To decide next step.   Hematuria, Adult Hematuria is blood in the urine. Blood may be visible in the urine, or it may be identified with a test. This condition can be caused by infections of the bladder, urethra, kidney, or prostate. Other possible causes include: Kidney stones. Cancer of the urinary tract. Too much calcium in the urine. Conditions that are passed from parent to child (inherited conditions). Exercise that requires a lot of energy. Infections can usually be treated with medicine, and a kidney stone usually will pass through your urine. If neither of these is the cause of yourhematuria, more tests may be needed to identify the cause of your symptoms. It is very important to tell your health care provider about any blood in your urine, even if it is painless or the blood stops without treatment. Blood in the urine, when it happens and then stops and then happens again, can be a symptom of a very serious condition, including cancer. There is no pain in theinitial stages of many urinary cancers. Follow these instructions at home: Medicines Take over-the-counter and prescription medicines only as told by your health care provider. If you were prescribed an antibiotic medicine, take it as told by your health care provider. Do not stop taking the antibiotic even if you start to feel better. Eating and drinking Drink enough fluid to keep your urine pale yellow. It is recommended that you drink 3-4 quarts (2.8-3.8 L) a day. If you have been diagnosed with an infection, drinking cranberry juice in addition to large amounts of water is recommended. Avoid caffeine, tea, and carbonated beverages. These tend to irritate the bladder. Avoid alcohol because it may irritate the prostate (in males). General  instructions If you have been diagnosed with a kidney stone, follow your health care provider's instructions about straining your urine to catch the stone. Empty your bladder often. Avoid holding urine for long periods of time. If you are male: After a bowel movement, wipe from front to back and use each piece of toilet paper only once. Empty your bladder before and after sex. Pay attention to any changes in your symptoms. Tell your health care provider about any changes or any new symptoms. It is up to you to get the results of any tests. Ask your health care provider, or the department that is doing the test, when your results will be ready. Keep all follow-up visits. This is important. Contact a health care provider if: You develop back pain. You have a fever or chills. You have nausea or vomiting. Your symptoms do not improve after 3 days. Your symptoms get worse. Get help right away if: You develop severe vomiting and are unable to take medicine without vomiting. You develop severe pain in your back or abdomen even though you are taking medicine. You pass a large amount of blood in your urine. You pass blood clots in your urine. You feel very weak or like you might faint. You faint. Summary Hematuria is blood in the urine. It has many possible causes. It is very important that you tell your health care provider about any blood in your urine, even if it is painless or the blood stops without treatment. Take over-the-counter and prescription medicines only as told by your health care  provider. Drink enough fluid to keep your urine pale yellow. This information is not intended to replace advice given to you by your health care provider. Make sure you discuss any questions you have with your healthcare provider. Document Revised: 08/11/2020 Document Reviewed: 08/11/2020 Elsevier Patient Education  2022 [employer].

## 2021-06-07 NOTE — Progress Notes (Signed)
Chief Complaint  Patient presents with   Abdominal Pain    Patient complains of abdominal pain, xmonths    Urinary Frequency    Patient complains of dysuria, Patient reports dark urine color      HPI: [name] 80 y.o. come in for acute SDA add-on visit today for urinary symptoms possible UTI.   Onset about 4 to 5 days ago of darker red urine associated with lower abdominal suprapubic discomfort and cramping but no specific dysuria.  No fever and chills. Stream appears to be normal baseline.  On no blood thinners except aspirin. Symptoms were concerning today with lower abdominal pain discomfort worse with urination.   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 Has history of urinary frequency and issues but has not seen a urologist for couple of years. History of stones been on Flomax.   ROS: See pertinent positives and negatives per HPI.  Past Medical History:  Diagnosis Date   Abdominal pain, right lateral 02/21/2012   progressive.     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   Cataract    COLONIC POLYPS, HX OF 10/18/2007   Cough 10/25/2010   DIZZINESS 01/22/2009   DYSPNEA ON EXERTION 08/21/2008   ECHOCARDIOGRAM, ABNORMAL 08/19/2007   GERD 07/25/2007   Heart murmur    HERNIA 07/06/2009   HX, PERSONAL, MALIGNANCY, SKIN NEC 10/18/2007   HYPERGLYCEMIA, BORDERLINE 12/31/2007   HYPERLIPIDEMIA 07/25/2007   HYPERTENSION 07/23/2007   Inguinal  hernia    LUMBAR RADICULOPATHY, RIGHT 10/18/2007   MUSCLE PAIN 01/22/2009   Nausea    NEURITIS 10/25/2010   Palpitations 05/09/2010   RHINITIS, CHRONIC 05/11/2010   RUQ PAIN 01/22/2009   Sinus polyp    UNS ADVRS EFF OTH RX MEDICINAL&BIOLOGICAL SBSTNC 04/15/2008   VERRUCA VULGARIS 12/31/2007   VITAMIN D DEFICIENCY 11/02/2009   Voice strain     Family History  Problem Relation Age of Onset   Lung cancer Mother    Cancer Mother    Heart attack Father    Hypertension Father    Alcohol abuse Father    Heart disease Father    Breast cancer Sister    Cancer Sister        breast   Alcohol abuse Brother    Hypertension Brother    Alcohol abuse Brother    Hypertension Brother    Alcohol abuse Brother    Hypertension Brother    Kidney disease Sister    Colon cancer Neg Hx    Esophageal cancer Neg Hx    Rectal cancer Neg Hx    Stomach cancer Neg Hx     Social History   Socioeconomic History   Marital status: Married    Spouse name: Not on file   Number of children: Not on file   Years of education: Not on file   Highest education level: Not on file  Occupational History   Not on file  Tobacco Use   Smoking status: Former    Pack years: 0.00    Types: Cigarettes    Quit date: 04/02/1964    Years since quitting: 57.2   Smokeless tobacco: Never   Tobacco comments:    in early 20's he quit  Vaping Use   Vaping Use: Never used  Substance and Sexual Activity   Alcohol use: Yes    Comment: socially   Drug use: No   Sexual activity: Not on file  Other Topics Concern   Not on file  Social History Narrative   Retired [employer]   Married   Non smoker some etoh   Education officer, museum a lot            Social Determinants of Radio broadcast assistant Strain: Low Risk    Difficulty of Paying Living Expenses: Not hard at all  Food Insecurity: No Food Insecurity   Worried About Charity fundraiser in the Last Year: Never true   Arboriculturist in the Last Year: Never true   Transportation Needs: No Transportation Needs   Lack of Transportation (Medical): No   Lack of Transportation (Non-Medical): No  Physical Activity: Inactive   Days of Exercise per Week: 0 days   Minutes of Exercise per Session: 0 min  Stress: No Stress Concern Present   Feeling of Stress : Not at all  Social Connections: Moderately Integrated   Frequency of Communication with Friends and Family: Once a week   Frequency of Social Gatherings with Friends and Family: Twice a week   Attends Religious Services: More than 4 times per year   Active Member of [employer] or Organizations: No   Attends Archivist Meetings: Never   Marital Status: Married    Outpatient Medications Prior to Visit  Medication Sig Dispense Refill   acetaminophen (TYLENOL) 500 MG tablet Take 500 mg by mouth in the morning and at bedtime.     Ascorbic Acid (VITAMIN C PO) Take by mouth.     aspirin 81 MG tablet Take 81 mg by mouth daily.     carbidopa-levodopa (SINEMET IR) 25-100 MG tablet Take 1 tablet by mouth 3 (three) times daily. 9am/1pm/5pm 270 tablet 1   Cholecalciferol (VITAMIN D3) 125 MCG (5000 UT) CAPS Take 1 capsule (5,000 Units total) by mouth daily. 90 capsule 3   clobetasol (OLUX) 0.05 % topical foam APPLY to back and leg 50 g 10   Coenzyme Q10 100 MG TABS Take 1 tablet by mouth daily.     Cyanocobalamin (B-12) 100 MCG TABS Take 1 tablet by mouth. Once every three days     DHA-EPA-Vitamin E (OMEGA-3 COMPLEX PO) Take 1,200 mg by mouth 3 (three) times daily. ProOmega LDL     magnesium oxide (MAG-OX) 400 MG tablet Take 400 mg daily by mouth.     metoprolol tartrate (LOPRESSOR) 50 MG tablet TAKE 1/2 TABLET BY MOUTH IN THE MORNING AND 1 TABLET BY MOUTH IN THE EVENING 135 tablet 1   metroNIDAZOLE (METROCREAM) 0.75 % cream APPLY TO FACE 45 g 0   MULTIPLE VITAMIN PO Take 1 tablet by mouth daily.     MYRBETRIQ 25 MG TB24 tablet Take 25 mg by mouth daily.  0   nitroGLYCERIN (NITROSTAT) 0.4 MG SL tablet  PLACE 1 TABLET UNDER TONGUE EVERY 5 MINUTES AS NEEDED FOR CHEST PAIN 25 tablet 3   RABEprazole (ACIPHEX) 20 MG tablet TAKE  1 TABLET BY MOUTH EVERY DAY 90 tablet 0   Red Yeast Rice 600 MG CAPS Take 1 capsule by mouth daily.     tamsulosin (FLOMAX) 0.4 MG CAPS capsule TAKE 1 CAPSULE BY MOUTH EVERY DAY 90 capsule 0   Facility-Administered Medications Prior to Visit  Medication Dose Route Frequency Provider Last Rate Last Admin   0.9 %  sodium chloride infusion  500 mL Intravenous Continuous Milus Banister, MD         EXAM:  BP 140/86 (BP Location: Left Arm, Patient Position: Sitting, Cuff Size: Normal)   Pulse 74   Temp 98.4 F (36.9 C) (Oral)   Ht 5\' 7"  (1.702 m)   Wt 202 lb 12.8 oz (92 kg)   SpO2 94%   BMI 31.76 kg/m   Body mass index is 31.76 kg/m.  GENERAL: vitals reviewed and listed above, alert, oriented, appears well hydrated and in no acute distress walks with shuffling gait some bending forward has a cane if needed normal speech HEENT: atraumatic, conjunctiva  clear, no obvious abnormalities on inspection of external nose and ears OP : Masked NECK: no obvious masses on inspection palpation  Difficult to get up on the table so examined standing negative flank pain no obvious abdominal masses points to tenderness in the suprapubic area but I do not feel a mass. Skin no active bruising. CV: HRRR, no clubbing cyanosis or  peripheral edema nl cap refill  MS: moves all extremities  PSYCH: pleasant and cooperative, no obvious depression or anxiety Lab Results  Component Value Date   WBC 7.6 03/28/2021   HGB 15.6 03/28/2021   HCT 44.9 03/28/2021   PLT 250.0 03/28/2021   GLUCOSE 130 (H) 03/28/2021   CHOL 180 10/13/2019   TRIG 126 10/13/2019   HDL 38 (L) 10/13/2019   LDLDIRECT 187.2 03/31/2008   LDLCALC 119 (H) 10/13/2019   ALT 19 10/13/2019   AST 13 10/13/2019   NA 141 03/28/2021   K 4.1 03/28/2021   CL 106 03/28/2021   CREATININE 0.76 03/28/2021   BUN 17 03/28/2021    CO2 25 03/28/2021   TSH 1.49 03/28/2021   PSA 1.49 02/21/2019   INR 1.1 (H) 01/25/2012   HGBA1C 6.0 03/28/2021   BP Readings from Last 3 Encounters:  06/07/21 140/86  04/05/21 124/70  03/28/21 116/66  Urinalysis Pos blood  3+neg leuk neg nitrites     ASSESSMENT AND PLAN:  Discussed the following assessment and plan:  Hematuria, unspecified type - Plan: Ambulatory referral to Urology  Dysuria - Plan: Culture, Urine, POCT Urinalysis Dipstick (Automated), Culture, Urine, Ambulatory referral to Urology  Lower abdominal pain - Plan: Ambulatory referral to Urology Possible UTI but not confirmed nor typical presentation.  Empiric treatment with Septra Bactrim urine culture pending Plan to referral back to urology I believe he saw someone about 3 years ago may be Dr. Lovena Neighbours ,will do new referral Contact us ofstatu report at end of antibiotic  or earlier if worse or not improving  -Patient advised to return or notify health care team  if  new concerns arise. Review of notes shown that he was seen by Dr. Lovena Neighbours in 2021 February for BPH.  We will do referral back to Dr. Lovena Neighbours or other on team. Patient Instructions  Begin antibiotic for possible infection causing the pain and blood urine.  I want you to see urology in follow up.   If not seen before then plan follow up status  at end of  antibiotic.  To decide next step.   Hematuria, Adult Hematuria is blood in the urine. Blood may be visible in the urine, or it may be identified with a test. This condition can be caused by infections of the bladder, urethra, kidney, or prostate. Other possible causes include: Kidney stones. Cancer of the urinary tract. Too much calcium in the urine. Conditions that are passed from parent to child (inherited conditions). Exercise that requires a lot of energy. Infections can usually be treated with medicine, and a kidney stone usually will pass through your urine. If neither of these is the cause  of yourhematuria, more tests may be needed to identify the cause of your symptoms. It is very important to tell your health care provider about any blood in your urine, even if it is painless or the blood stops without treatment. Blood in the urine, when it happens and then stops and then happens again, can be a symptom of a very serious condition, including cancer. There is no pain in theinitial stages of many urinary cancers. Follow these instructions at home: Medicines Take over-the-counter and prescription medicines only as told by your health care provider. If you were prescribed an antibiotic medicine, take it as told by your health care provider. Do not stop taking the antibiotic even if you start to feel better. Eating and drinking Drink enough fluid to keep your urine pale yellow. It is recommended that you drink 3-4 quarts (2.8-3.8 L) a day. If you have been diagnosed with an infection, drinking cranberry juice in addition to large amounts of water is recommended. Avoid caffeine, tea, and carbonated beverages. These tend to irritate the bladder. Avoid alcohol because it may irritate the prostate (in males). General instructions If you have been diagnosed with a kidney stone, follow your health care provider's instructions about straining your urine to catch the stone. Empty your bladder often. Avoid holding urine for long periods of time. If you are male: After a bowel movement, wipe from front to back and use each piece of toilet paper only once. Empty your bladder before and after sex. Pay attention to any changes in your symptoms. Tell your health care provider about any changes or any new symptoms. It is up to you to get the results of any tests. Ask your health care provider, or the department that is doing the test, when your results will be ready. Keep all follow-up visits. This is important. Contact a health care provider if: You develop back pain. You have a fever or  chills. You have nausea or vomiting. Your symptoms do not improve after 3 days. Your symptoms get worse. Get help right away if: You develop severe vomiting and are unable to take medicine without vomiting. You develop severe pain in your back or abdomen even though you are taking medicine. You pass a large amount of blood in your urine. You pass blood clots in your urine. You feel very weak or like you might faint. You faint. Summary Hematuria is blood in the urine. It has many possible causes. It is very important that you tell your health care provider about any blood in your urine, even if it is painless or the blood stops without treatment. Take over-the-counter and prescription medicines only as told by your health care provider. Drink enough fluid to keep your urine pale yellow. This information is not intended to replace advice given to you by your health care provider. Make sure you discuss any questions you have with  your healthcare provider. Document Revised: 08/11/2020 Document Reviewed: 08/11/2020 Elsevier Patient Education  2022 [place] Marinna Blane M.D.

## 2021-06-08 LAB — URINE CULTURE
MICRO NUMBER:: 12005573
SPECIMEN QUALITY:: ADEQUATE

## 2021-06-09 DIAGNOSIS — G20 Parkinson's disease: Secondary | ICD-10-CM | POA: Diagnosis not present

## 2021-06-10 NOTE — Progress Notes (Signed)
Urine culture shows no definitive  UTI  stay on antibiotic  and make sure he has a  urology  appt   soon. ( See last note)

## 2021-06-14 ENCOUNTER — Other Ambulatory Visit: Payer: Self-pay | Admitting: Internal Medicine

## 2021-06-14 DIAGNOSIS — R310 Gross hematuria: Secondary | ICD-10-CM | POA: Diagnosis not present

## 2021-06-16 DIAGNOSIS — N201 Calculus of ureter: Secondary | ICD-10-CM | POA: Diagnosis not present

## 2021-06-16 DIAGNOSIS — K5730 Diverticulosis of large intestine without perforation or abscess without bleeding: Secondary | ICD-10-CM | POA: Diagnosis not present

## 2021-06-16 DIAGNOSIS — N400 Enlarged prostate without lower urinary tract symptoms: Secondary | ICD-10-CM | POA: Diagnosis not present

## 2021-06-16 DIAGNOSIS — N132 Hydronephrosis with renal and ureteral calculous obstruction: Secondary | ICD-10-CM | POA: Diagnosis not present

## 2021-06-16 DIAGNOSIS — N281 Cyst of kidney, acquired: Secondary | ICD-10-CM | POA: Diagnosis not present

## 2021-06-16 DIAGNOSIS — K4090 Unilateral inguinal hernia, without obstruction or gangrene, not specified as recurrent: Secondary | ICD-10-CM | POA: Diagnosis not present

## 2021-06-22 DIAGNOSIS — H43811 Vitreous degeneration, right eye: Secondary | ICD-10-CM | POA: Diagnosis not present

## 2021-06-22 DIAGNOSIS — H04123 Dry eye syndrome of bilateral lacrimal glands: Secondary | ICD-10-CM | POA: Diagnosis not present

## 2021-06-22 DIAGNOSIS — H524 Presbyopia: Secondary | ICD-10-CM | POA: Diagnosis not present

## 2021-06-22 DIAGNOSIS — H35373 Puckering of macula, bilateral: Secondary | ICD-10-CM | POA: Diagnosis not present

## 2021-06-22 DIAGNOSIS — Z961 Presence of intraocular lens: Secondary | ICD-10-CM | POA: Diagnosis not present

## 2021-06-23 ENCOUNTER — Telehealth: Payer: Self-pay | Admitting: Pharmacist

## 2021-06-23 NOTE — Chronic Care Management (AMB) (Addendum)
    Chronic Care Management Pharmacy Assistant   Name: [name]  MRN: [id number] DOB: 03/21/1941  06/23/21- Called patient to remind of appointment with Jeni Salles) on (06/24/21 at 11am by phone. Patient wants reschedule. I called and spoke with [name] and rescheduled to 1pm on 06/24/21 by phone. Okay per Jeni Salles)  Patient aware of appointment date, time, and type of appointment (either telephone or in person). Patient aware to have/bring all medications, supplements, blood pressure and/or blood sugar logs to visit.  Questions: Have you had any recent office visit or specialist visit outside of [facility]?  Yes. [facility] Was seen for a suspected UTI and it was clear. Patient then went to urgent care and confirmed that he did not have a UTI but a kidney stone. Urgent care was in [place].  Are there any concerns you would like to discuss during your office visit?  No concerns at this time. Patient is just dealing with a kidney stone. Are you having any problems obtaining your medications? (Whether it pharmacy issues or cost)  NO issues getting medications. If patient has any PAP medications ask if they are having any problems getting their PAP medication or refill?  No PAP at this time.   Star Rating Drug:  None.  Any gaps in medications fill history? N/A  [place] Pharmacist Assistant [phone]

## 2021-06-24 ENCOUNTER — Ambulatory Visit (INDEPENDENT_AMBULATORY_CARE_PROVIDER_SITE_OTHER): Payer: Medicare Other | Admitting: Pharmacist

## 2021-06-24 DIAGNOSIS — N138 Other obstructive and reflux uropathy: Secondary | ICD-10-CM | POA: Diagnosis not present

## 2021-06-24 DIAGNOSIS — N401 Enlarged prostate with lower urinary tract symptoms: Secondary | ICD-10-CM | POA: Diagnosis not present

## 2021-06-24 DIAGNOSIS — I10 Essential (primary) hypertension: Secondary | ICD-10-CM

## 2021-06-24 NOTE — Progress Notes (Signed)
 Chronic Care Management Pharmacy Note  06/28/2021 Name:  [name] MRN:  [id number] DOB:  05/13/1941  Summary: LDL not at goal < 70  Recommendations/Changes made from today's visit: -Recommended for patient to limit supplementation of vitamin C and vitamin D due to kidney stone formation. -Recommend repeat vitamin D level to avoid oversupplementation. -Recommend cholesterol therapy for LDL lowering but patient declines  Plan: Follow up BP assessment in 2 months   Subjective: [name] is an 79 y.o. year old male who is a primary patient of Panosh, Wanda K, MD.  The CCM team was consulted for assistance with disease management and care coordination needs.    Engaged with patient by telephone for follow up visit in response to provider referral for pharmacy case management and/or care coordination services.   Consent to Services:  The patient was given information about Chronic Care Management services, agreed to services, and gave verbal consent prior to initiation of services.  Please see initial visit note for detailed documentation.   Patient Care Team: Panosh, Wanda K, MD as PCP - General Nishan, Peter C, MD (Cardiology) Owen, Clarence H, MD (Thoracic Diseases) Jacobs, Daniel P, MD as Attending Physician (Gastroenterology) Brown, William R Jr., MD as Referring Physician (Neurosurgery) Pryor, Madeline G, RPH as Pharmacist (Pharmacist) Sheffield, Kelli R, PA-C as Physician Assistant (Dermatology)  Recent office visits: 06/07/21 Wanda Panosh, MD: Patient presented with hematuria and urinary frequency.  Referral placed to urology. Prescribed Bactrim x 10 days.  03/28/21 Wanda Panosh, MD: Patient presented for shakiness. Referred to neurology for possible parkinson's and memory concerns.  Recent consult visits: 04/05/21 Rebecca [name], DO (neurology): Patient presented to establish care for Parkinson's disease. Prescribed carbidopa/levodopa 25/100 mg with a taper up to  1 tablet three times daily.  03/31/21 Kelli Sheffield, PA-C (dermatology): Patient presented for rash and rosacea. Prescribed metronidazole for rosacea and clobetasol for rash.  11/16/20 William Brown, MD (brain and spine surgery): Patient presented for follow up for right leg weakness. Unable to access notes.  09/14/20 Lori Gerhardt, NP (cardiology): Patient presented for ischemic heart disease, HTN, and heart murmur follow up. Patient is not active and has a rare twinge of chest pain. Balance is poor. BP has been ok off the ARB. Follow up in 4 months.  Hospital visits: -01/03/21 Patient admitted for lumbar herniated disc surgery.   Objective:  Lab Results  Component Value Date   CREATININE 0.76 03/28/2021   BUN 17 03/28/2021   GFR 84.34 03/28/2021   GFRNONAA 88 07/14/2020   GFRAA 101 07/14/2020   NA 141 03/28/2021   K 4.1 03/28/2021   CALCIUM 9.8 03/28/2021   CO2 25 03/28/2021   GLUCOSE 130 (H) 03/28/2021    Lab Results  Component Value Date/Time   HGBA1C 6.0 03/28/2021 02:30 PM   HGBA1C 5.8 02/20/2017 02:40 PM   GFR 84.34 03/28/2021 02:30 PM   GFR 124.29 02/20/2017 02:40 PM    Last diabetic Eye exam: No results found for: HMDIABEYEEXA  Last diabetic Foot exam: No results found for: HMDIABFOOTEX   Lab Results  Component Value Date   CHOL 180 10/13/2019   HDL 38 (L) 10/13/2019   LDLCALC 119 (H) 10/13/2019   LDLDIRECT 187.2 03/31/2008   TRIG 126 10/13/2019   CHOLHDL 4.7 10/13/2019    Hepatic Function Latest Ref Rng & Units 10/13/2019 02/19/2019 08/14/2018  Total Protein 6.0 - 8.5 g/dL 6.2 6.4 6.4  Albumin 3.7 - 4.7 g/dL 4.4 4.3 4.4  AST 0 -   40 IU/L _0 ALT 0 - 44 IU/L _1 Alk Phosphatase 39 - 117 IU/L 72 62 64  Total Bilirubin 0.0 - 1.2 mg/dL 0.6 0.6 0.7  Bilirubin, Direct 0.00 - 0.40 mg/dL 0.13 0.16 0.17    Lab Results  Component Value Date/Time   TSH 1.49 03/28/2021 02:30 PM   TSH 1.640 10/13/2019 03:32 PM   FREET4 1.18 03/28/2021 02:30 PM     CBC Latest Ref Rng & Units 03/28/2021 07/14/2020 10/13/2019  WBC 4.0 - 10.5 K/uL 7.6 7.5 8.1  Hemoglobin 13.0 - 17.0 g/dL 15.6 15.1 15.8  Hematocrit 39.0 - 52.0 % 44.9 43.6 45.7  Platelets 150.0 - 400.0 K/uL 250.0 271 235    Lab Results  Component Value Date/Time   VD25OH 29.3 (L) 10/13/2019 03:32 PM   VD25OH 43 07/28/2010 09:27 PM    Clinical ASCVD: Yes  The ASCVD Risk score Mikey Bussing DC Jr., et al., 2013) failed to calculate for the following reasons:   The 2013 ASCVD risk score is only valid for ages 66 to 78    Depression screen PHQ 2/9 12/06/2020 04/09/2019 02/20/2017  Decreased Interest 0 0 0  Down, Depressed, Hopeless 0 0 0  PHQ - 2 Score 0 0 0  Altered sleeping 0 - -  Tired, decreased energy 0 - -  Change in appetite 0 - -  Feeling bad or failure about yourself  0 - -  Trouble concentrating 0 - -  Moving slowly or fidgety/restless 0 - -  Suicidal thoughts 0 - -  PHQ-9 Score 0 - -  Difficult doing work/chores Not difficult at all - -  Some recent data might be hidden    CHA2DS2/VAS Stroke Risk Points  Current as of 23 minutes ago     4 >= 2 Points: High Risk  1 - 1.99 Points: Medium Risk  0 Points: Low Risk    Last Change: N/A      Details    This score determines the patient's risk of having a stroke if the  patient has atrial fibrillation.       Points Metrics  0 Has Congestive Heart Failure:  No    Current as of 23 minutes ago  1 Has Vascular Disease:  Yes    Current as of 23 minutes ago  1 Has Hypertension:  Yes    Current as of 23 minutes ago  2 Age:  58    Current as of 23 minutes ago  0 Has Diabetes:  No    Current as of 23 minutes ago  0 Had Stroke:  No  Had TIA:  No  Had Thromboembolism:  No    Current as of 23 minutes ago  0 Male:  No    Current as of 23 minutes ago     Social History   Tobacco Use  Smoking Status Former   Pack years: 0.00   Types: Cigarettes   Quit date: 04/02/1964   Years since quitting: 57.2  Smokeless Tobacco Never   Tobacco Comments   in early 20's he quit   BP Readings from Last 3 Encounters:  06/07/21 140/86  04/05/21 124/70  03/28/21 116/66   Pulse Readings from Last 3 Encounters:  06/07/21 74  04/05/21 76  03/28/21 71   Wt Readings from Last 3 Encounters:  06/07/21 202 lb 12.8 oz (92 kg)  04/05/21 203 lb (92.1 kg)  03/28/21 204 lb 6.4 oz (92.7 kg)   BMI Readings  from Last 3 Encounters:  06/07/21 31.76 kg/m  04/05/21 31.33 kg/m  03/28/21 31.54 kg/m    Assessment/Interventions: Review of patient past medical history, allergies, medications, health status, including review of consultants reports, laboratory and other test data, was performed as part of comprehensive evaluation and provision of chronic care management services.   SDOH:  (Social Determinants of Health) assessments and interventions performed: No  SDOH Screenings   Alcohol Screen: Low Risk    Last Alcohol Screening Score (AUDIT): 1  Depression (PHQ2-9): Low Risk    PHQ-2 Score: 0  Financial Resource Strain: Low Risk    Difficulty of Paying Living Expenses: Not hard at all  Food Insecurity: No Food Insecurity   Worried About Charity fundraiser in the Last Year: Never true   Ran Out of Food in the Last Year: Never true  Housing: Low Risk    Last Housing Risk Score: 0  Physical Activity: Inactive   Days of Exercise per Week: 0 days   Minutes of Exercise per Session: 0 min  Social Connections: Moderately Integrated   Frequency of Communication with Friends and Family: Once a week   Frequency of Social Gatherings with Friends and Family: Twice a week   Attends Religious Services: More than 4 times per year   Active Member of [employer] or Organizations: No   Attends Music therapist: Never   Marital Status: Married  Stress: No Stress Concern Present   Feeling of Stress : Not at all  Tobacco Use: Medium Risk   Smoking Tobacco Use: Former   Smokeless Tobacco Use: Never  Transportation Needs: No  Data processing manager (Medical): No   Lack of Transportation (Non-Medical): No    CCM Care Plan  Allergies  Allergen Reactions   Propoxyphene N-Acetaminophen Nausea And Vomiting   Iodinated Diagnostic Agents Swelling    Pt developed slight lt upper lip swelling only to one side about 15 minutes post IV contrast injection. No other symptoms.     Medications Reviewed Today     Reviewed by Viona Gilmore, [facility] (Pharmacist) on 06/24/21 at 1323  Med List Status: <None>   Medication Order Taking? Sig Documenting Provider Last Dose Status Informant  0.9 %  sodium chloride infusion [id number]   Milus Banister, MD  Active   acetaminophen (TYLENOL) 500 MG tablet [id number]  Take 500 mg by mouth in the morning and at bedtime. [provider]  Active   Ascorbic Acid (VITAMIN C PO) [id number]  Take by mouth. [provider]  Active   aspirin 81 MG tablet [id number]  Take 81 mg by mouth daily. [provider]  Active   carbidopa-levodopa (SINEMET IR) 25-100 MG tablet [id number]  Take 1 tablet by mouth 3 (three) times daily. 9am/1pm/5pm [name], [name], DO  Active   Cholecalciferol (VITAMIN D3) 125 MCG (5000 UT) CAPS [id number]  Take 1 capsule (5,000 Units total) by mouth daily. Josue Hector, MD  Active   clobetasol (OLUX) 0.05 % topical foam [id number]  APPLY to back and leg Starlyn Skeans  Active   Coenzyme Q10 100 MG TABS [id number]  Take 1 tablet by mouth daily. [provider]  Active   Cyanocobalamin (B-12) 100 MCG TABS [id number]  Take 1 tablet by mouth. Once every three days [provider]  Active   DHA-EPA-Vitamin E (OMEGA-3 COMPLEX PO) [id number]  Take 1,200 mg by mouth 3 (three) times daily. ProOmega LDL [provider]  Active   magnesium oxide (MAG-OX) 400 MG tablet [id number]  Take 400 mg daily by mouth. [provider]  Active   metoprolol tartrate (LOPRESSOR) 50 MG tablet [id number]  TAKE 1/2  TABLET BY MOUTH IN THE MORNING AND 1 TABLET BY MOUTH IN THE EVENING Panosh, Standley Brooking, MD  Active   metroNIDAZOLE (METROCREAM) 0.75 % cream [id number]  APPLY TO Warrick Parisian, MD  Active   MULTIPLE VITAMIN PO [id number]  Take 1 tablet by mouth daily. [provider]  Active   MYRBETRIQ 25 MG TB24 tablet [id number]  Take 25 mg by mouth daily. [provider]  Active            Med Note Chanetta Marshall Mar 26, 2017 10:43 AM)    nitroGLYCERIN (NITROSTAT) 0.4 MG SL tablet [id number]  PLACE 1 TABLET UNDER TONGUE EVERY 5 MINUTES AS NEEDED FOR CHEST PAIN Burtis Junes, NP  Active   RABEprazole (ACIPHEX) 20 MG tablet [id number]  TAKE 1 TABLET BY MOUTH EVERY DAY Panosh, Standley Brooking, MD  Active   Red Yeast Rice 600 MG CAPS [id number]  Take 1 capsule by mouth daily. [provider]  Active     Discontinued 06/24/21 1315 (Completed Course)   tamsulosin (FLOMAX) 0.4 MG CAPS capsule [id number]  TAKE 1 CAPSULE BY MOUTH EVERY DAY Panosh, Standley Brooking, MD  Active             Patient Active Problem List   Diagnosis Date Noted   Parkinson's disease ([place]) 04/05/2021   Cauda equina syndrome ([place]) 05/28/2020   Allergic conjunctivitis of both eyes 04/22/2018   Neck pain on right side 11/03/2014   Urinary frequency 07/27/2014   Medicare annual wellness visit, initial 07/27/2014   Rash and nonspecific skin eruption 07/27/2014   Nodule, subcutaneous 07/27/2014   Atherosclerosis of aorta ([place]) 10/25/2013   Hyperglycemia 03/07/2013   Status post laparoscopic cholecystectomy 03/07/2013   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   Bilateral renal cysts 02/28/2012   Hepatic cyst 02/28/2012   Atherosclerosis 02/28/2012   Contact dermatitis 02/28/2012   Dermatitis 04/03/2011   BACK PAIN, LUMBAR 10/25/2010   NEURITIS 10/25/2010   RHINITIS, CHRONIC 05/11/2010   PALPITATIONS 05/09/2010   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   VITAMIN  D DEFICIENCY 11/02/2009   BACK PAIN 11/02/2009   BLADDER NECK OBSTRUCTION 07/07/2009   ATRIAL FIBRILLATION 07/06/2009   CAD 01/22/2009   MUSCLE PAIN 01/22/2009   DYSPNEA ON EXERTION 08/21/2008   VERRUCA VULGARIS 12/31/2007   HYPERGLYCEMIA, BORDERLINE 12/31/2007   LUMBAR RADICULOPATHY, RIGHT 10/18/2007   HX, PERSONAL, MALIGNANCY, SKIN NEC 10/18/2007   COLONIC POLYPS, HX OF 10/18/2007   ECHOCARDIOGRAM, ABNORMAL 08/19/2007   HYPERLIPIDEMIA 07/25/2007   GERD 07/25/2007   BENIGN PROSTATIC HYPERTROPHY 07/25/2007   Essential hypertension 07/23/2007   ALLERGIC RHINITIS 07/23/2007    Immunization History  Administered Date(s) Administered   Fluad Quad(high Dose 65+) 09/23/2019   Influenza Split 10/13/2011, 09/02/2012, 12/29/2013   Influenza Whole 10/18/2007, 09/21/2009, 09/15/2010   Influenza, High Dose Seasonal PF 09/21/2014, 08/18/2015, 10/09/2016, 08/31/2017, 09/10/2018, 08/23/2020   Influenza-Unspecified 08/31/2017   PFIZER(Purple Top)SARS-COV-2 Vaccination 02/17/2020, 03/09/2020, 11/30/2020   Pneumococcal Conjugate-13 07/27/2014   Pneumococcal Polysaccharide-23 02/09/2016   Tdap 08/18/2015   Zoster Recombinat (Shingrix) 10/16/2020, 04/20/2021   Zoster, Live 03/12/2014   Patient reports his biggest concerns right now are his recent kidney stone and inquired about ways to prevent it. We discussed  how oversupplementation with vitamin C, calcium, vitamin D and how cranberry juice (or other juice) can exacerbate it. Patient has another CT scan on the 6th scheduled with urology and he may have to have surgery to remove it.   Patient also does not like his new parkinson's disease medication and the schedule for it is difficult to keep up with. He is not to have any protein 30 minutes before or 30 minutes after taking it. He reports that he has not missed any doses yet.   Conditions to be addressed/monitored:  Hypertension, Hyperlipidemia, Atrial Fibrillation, GERD, Overactive Bladder,  BPH, and Parkinson's disease  Care Plan : CCM Pharmacy Care Plan  Updates made by Pryor, Madeline G, RPH since 06/28/2021 12:00 AM     Problem: Problem: Hypertension, Hyperlipidemia, Atrial Fibrillation, GERD, Overactive Bladder, BPH, and Parkinson's disease      Long-Range Goal: Patient-Specific Goal   Start Date: 06/24/2021  Expected End Date: 06/24/2022  This Visit's Progress: On track  Priority: High  Note:   Current Barriers:  Unable to independently monitor therapeutic efficacy Unable to achieve control of cholesterol   Pharmacist Clinical Goal(s):  Patient will achieve adherence to monitoring guidelines and medication adherence to achieve therapeutic efficacy achieve control of cholesterol as evidenced by next lipid panel  through collaboration with PharmD and provider.   Interventions: 1:1 collaboration with Panosh, Wanda K, MD regarding development and update of comprehensive plan of care as evidenced by provider attestation and co-signature Inter-disciplinary care team collaboration (see longitudinal plan of care) Comprehensive medication review performed; medication list updated in electronic medical record  Hypertension (BP goal <140/90) -Controlled -Current treatment: Metoprolol tartrate 50 mg 1/2 tablet in the morning and 1 tablet in the evening -Medications previously tried: none  -Current home readings: does not check consistently at home -Current dietary habits: patient avoids adding extra salt but a lot of his fast food meals and TV dinners have sodium -Current exercise habits: limited -Denies hypotensive/hypertensive symptoms -Educated on Importance of home blood pressure monitoring; Proper BP monitoring technique; -Counseled to monitor BP at home weekly, document, and provide log at future appointments -Counseled on diet and exercise extensively Recommended to continue current medication  Hyperlipidemia/CAD: (LDL goal < 70) -Uncontrolled -Current  treatment: Omega 3 complex 1,200 mg one gummy daily Red yeast rice 1 capsule daily -Medications previously tried: atorvastatin, pravastatin (myalgias), Zetia -Current dietary patterns: did not discuss -Current exercise habits: does not routinely exercise -Educated on Cholesterol goals;  Importance of limiting foods high in cholesterol; -Counseled on lack of benefit from fish oil with current dose and can consider stopping depending on results of lipid panel  CAD (Goal: prevent heart events) -Uncontrolled (no statin) -Current treatment  Aspirin 81 mg 1 tablet daily Nitroglycerin 0.4 mg SL tablet as needed -Medications previously tried: none  -Recommended to continue current medication   Atrial Fibrillation (Goal: prevent stroke and major bleeding) -Controlled -CHADSVASC: 3 -Current treatment: Rate control: Metoprolol tartrate 50 mg 1/2 tablet in the morning and 1 tablet in the evening Anticoagulation: aspirin 81 mg 1 tablet daily -Medications previously tried: none -Home BP and HR readings: does not check routinely  -Counseled on avoidance of NSAIDs due to increased bleeding risk with anticoagulants; -Recommended to continue current medication  GERD (Goal: minimize symptoms) -Controlled -Current treatment  Rabeprazole 20 mg 1 tablet daily -Medications previously tried: none  -Counseled on non-pharmacologic management of symptoms such as elevating the head of your bed, avoiding eating 2-3 hours before bed, avoiding triggering   foods such as acidic, spicy, or fatty foods, eating smaller meals, and wearing clothes that are loose around the waist  BPH (Goal: minimize symptoms of enlarged prostate) -Not ideally controlled -Current treatment  Tamsulosin 0.4 mg 1 capsule daily - in AM -Medications previously tried: none  -Recommended discussion with urologist.  Overactive bladder (Goal: minimize symptoms) -Not ideally controlled -Current treatment  Myrbetriq 25 mg 1 tablet  daily -Medications previously tried: none  -Recommended discussion with urologist.  Parkinson's disease (Goal: minimize symptoms) -Controlled -Current treatment  Carbidopa-levodopa 25-100 mg 1 tablet three times daily -Medications previously tried: none  -Recommended to continue current medication Counseled on proper administration times and side effects with medication.   Health Maintenance -Vaccine gaps: COVID booster -Current therapy:  Metronidazole cream 0.75% as needed Tylenol 500 mg 1 tablet in AM and 1 tablet in PM Ibuprofen as needed Vitamin D3 5000 units 1 capsule daily Coenzyme Q10 gummy 100 mg 1 tablet daily Magnesium 400 mg gummy 1 tablet daily Multivitamin 1 tablet daily -Educated on Cost vs benefit of each product must be carefully weighed by individual consumer -Patient is satisfied with current therapy and denies issues -Recommended decreasing vitamin C supplementation to 250 mg daily and decreasing vitamin D intake to avoid oversupplementation.  Patient Goals/Self-Care Activities Patient will:  - take medications as prescribed check blood pressure weekly, document, and provide at future appointments target a minimum of 150 minutes of moderate intensity exercise weekly  Follow Up Plan: Telephone follow up appointment with care management team member scheduled for: 6 months        Medication Assistance: None required.  Patient affirms current coverage meets needs.  Compliance/Adherence/Medication fill history: Care Gaps: COVID booster  Star-Rating Drugs: None  Patient's preferred pharmacy is:  [id number] - [place], [place] - [address] [place] [address] Phone: [phone] Fax: [phone]  Uses pill box? No - wife gives him his medications - recommended using a pill box or setting reminders but patient would forget or ignore the alarm (he denies any recently missed doses)    Pt endorses 100%  compliance  We discussed: Current pharmacy is preferred with insurance plan and patient is satisfied with pharmacy services Patient decided to: Continue current medication management strategy  Care Plan and Follow Up Patient Decision:  Patient agrees to Care Plan and Follow-up.  Plan: Telephone follow up appointment with care management team member scheduled for:  6 months  Madeline Pryor, PharmD, BCACP Clinical Pharmacist [facility] [phone]       

## 2021-06-28 NOTE — Patient Instructions (Signed)
Hi Raydon,  It was great to speak with you again over the telephone! Below is a summary of some of the topics we discussed.   Please reach out to me if you have any questions or need anything before our follow up!  Best, Maddie  Jeni Salles, PharmD, [place] [facility]   Visit Information   Goals Addressed   None    Patient Care Plan: CCM Pharmacy Care Plan     Problem Identified: Problem: Hypertension, Hyperlipidemia, Atrial Fibrillation, GERD, Overactive Bladder, BPH, and Parkinson's disease      Long-Range Goal: Patient-Specific Goal   Start Date: 06/24/2021  Expected End Date: 06/24/2022  This Visit's Progress: On track  Priority: High  Note:   Current Barriers:  Unable to independently monitor therapeutic efficacy Unable to achieve control of cholesterol   Pharmacist Clinical Goal(s):  Patient will achieve adherence to monitoring guidelines and medication adherence to achieve therapeutic efficacy achieve control of cholesterol as evidenced by next lipid panel  through collaboration with PharmD and provider.   Interventions: 1:1 collaboration with Panosh, Standley Brooking, MD regarding development and update of comprehensive plan of care as evidenced by provider attestation and co-signature Inter-disciplinary care team collaboration (see longitudinal plan of care) Comprehensive medication review performed; medication list updated in electronic medical record  Hypertension (BP goal <140/90) -Controlled -Current treatment: Metoprolol tartrate 50 mg 1/2 tablet in the morning and 1 tablet in the evening -Medications previously tried: none  -Current home readings: does not check consistently at home -Current dietary habits: patient avoids adding extra salt but a lot of his fast food meals and TV dinners have sodium -Current exercise habits: limited -Denies hypotensive/hypertensive symptoms -Educated on Importance of home  blood pressure monitoring; Proper BP monitoring technique; -Counseled to monitor BP at home weekly, document, and provide log at future appointments -Counseled on diet and exercise extensively Recommended to continue current medication  Hyperlipidemia/CAD: (LDL goal < 70) -Uncontrolled -Current treatment: Omega 3 complex 1,200 mg one gummy daily Red yeast rice 1 capsule daily -Medications previously tried: atorvastatin, pravastatin (myalgias), Zetia -Current dietary patterns: did not discuss -Current exercise habits: does not routinely exercise -Educated on Cholesterol goals;  Importance of limiting foods high in cholesterol; -Counseled on lack of benefit from fish oil with current dose and can consider stopping depending on results of lipid panel  CAD (Goal: prevent heart events) -Uncontrolled (no statin) -Current treatment  Aspirin 81 mg 1 tablet daily Nitroglycerin 0.4 mg SL tablet as needed -Medications previously tried: none  -Recommended to continue current medication   Atrial Fibrillation (Goal: prevent stroke and major bleeding) -Controlled -CHADSVASC: 3 -Current treatment: Rate control: Metoprolol tartrate 50 mg 1/2 tablet in the morning and 1 tablet in the evening Anticoagulation: aspirin 81 mg 1 tablet daily -Medications previously tried: none -Home BP and HR readings: does not check routinely  -Counseled on avoidance of NSAIDs due to increased bleeding risk with anticoagulants; -Recommended to continue current medication  GERD (Goal: minimize symptoms) -Controlled -Current treatment  Rabeprazole 20 mg 1 tablet daily -Medications previously tried: none  -Counseled on non-pharmacologic management of symptoms such as elevating the head of your bed, avoiding eating 2-3 hours before bed, avoiding triggering foods such as acidic, spicy, or fatty foods, eating smaller meals, and wearing clothes that are loose around the waist  BPH (Goal: minimize symptoms of enlarged  prostate) -Not ideally controlled -Current treatment  Tamsulosin 0.4 mg 1 capsule daily - in AM -Medications previously  tried: none  -Recommended discussion with urologist.  Overactive bladder (Goal: minimize symptoms) -Not ideally controlled -Current treatment  Myrbetriq 25 mg 1 tablet daily -Medications previously tried: none  -Recommended discussion with urologist.  Parkinson's disease (Goal: minimize symptoms) -Controlled -Current treatment  Carbidopa-levodopa 25-100 mg 1 tablet three times daily -Medications previously tried: none  -Recommended to continue current medication Counseled on proper administration times and side effects with medication.   Health Maintenance -Vaccine gaps: COVID booster -Current therapy:  Metronidazole cream 0.75% as needed Tylenol 500 mg 1 tablet in AM and 1 tablet in PM Ibuprofen as needed Vitamin D3 5000 units 1 capsule daily Coenzyme Q10 gummy 100 mg 1 tablet daily Magnesium 400 mg gummy 1 tablet daily Multivitamin 1 tablet daily -Educated on Cost vs benefit of each product must be carefully weighed by individual consumer -Patient is satisfied with current therapy and denies issues -Recommended decreasing vitamin C supplementation to 250 mg daily and decreasing vitamin D intake to avoid oversupplementation.  Patient Goals/Self-Care Activities Patient will:  - take medications as prescribed check blood pressure weekly, document, and provide at future appointments target a minimum of 150 minutes of moderate intensity exercise weekly  Follow Up Plan: Telephone follow up appointment with care management team member scheduled for: 6 months       Patient verbalizes understanding of instructions provided today and agrees to view in [place].  Telephone follow up appointment with pharmacy team member scheduled for: 6 months  Viona Gilmore, [facility]

## 2021-06-29 DIAGNOSIS — N201 Calculus of ureter: Secondary | ICD-10-CM | POA: Diagnosis not present

## 2021-08-04 ENCOUNTER — Encounter: Payer: Self-pay | Admitting: Neurology

## 2021-08-09 ENCOUNTER — Encounter (HOSPITAL_COMMUNITY): Payer: Self-pay

## 2021-08-09 ENCOUNTER — Emergency Department (HOSPITAL_COMMUNITY): Payer: Medicare Other

## 2021-08-09 ENCOUNTER — Other Ambulatory Visit: Payer: Self-pay

## 2021-08-09 ENCOUNTER — Emergency Department (HOSPITAL_COMMUNITY)
Admission: EM | Admit: 2021-08-09 | Discharge: 2021-08-10 | Disposition: A | Discharge status: Home / Self Care | Payer: Medicare Other | Attending: Emergency Medicine | Admitting: Emergency Medicine

## 2021-08-09 DIAGNOSIS — Z85828 Personal history of other malignant neoplasm of skin: Secondary | ICD-10-CM | POA: Diagnosis not present

## 2021-08-09 DIAGNOSIS — M6281 Muscle weakness (generalized): Secondary | ICD-10-CM | POA: Diagnosis not present

## 2021-08-09 DIAGNOSIS — G20 Parkinson's disease: Secondary | ICD-10-CM | POA: Insufficient documentation

## 2021-08-09 DIAGNOSIS — I2510 Atherosclerotic heart disease of native coronary artery without angina pectoris: Secondary | ICD-10-CM | POA: Insufficient documentation

## 2021-08-09 DIAGNOSIS — H5500 Unspecified nystagmus: Secondary | ICD-10-CM | POA: Diagnosis not present

## 2021-08-09 DIAGNOSIS — Z7982 Long term (current) use of aspirin: Secondary | ICD-10-CM | POA: Insufficient documentation

## 2021-08-09 DIAGNOSIS — Z87891 Personal history of nicotine dependence: Secondary | ICD-10-CM | POA: Insufficient documentation

## 2021-08-09 DIAGNOSIS — H81392 Other peripheral vertigo, left ear: Secondary | ICD-10-CM | POA: Diagnosis not present

## 2021-08-09 DIAGNOSIS — I10 Essential (primary) hypertension: Secondary | ICD-10-CM | POA: Insufficient documentation

## 2021-08-09 DIAGNOSIS — I700 Atherosclerosis of aorta: Secondary | ICD-10-CM | POA: Diagnosis not present

## 2021-08-09 DIAGNOSIS — R55 Syncope and collapse: Secondary | ICD-10-CM | POA: Insufficient documentation

## 2021-08-09 DIAGNOSIS — Z951 Presence of aortocoronary bypass graft: Secondary | ICD-10-CM | POA: Diagnosis not present

## 2021-08-09 DIAGNOSIS — Z20822 Contact with and (suspected) exposure to covid-19: Secondary | ICD-10-CM | POA: Diagnosis not present

## 2021-08-09 DIAGNOSIS — R42 Dizziness and giddiness: Secondary | ICD-10-CM | POA: Diagnosis not present

## 2021-08-09 LAB — COMPREHENSIVE METABOLIC PANEL
ALT: 7 U/L (ref 0–44)
AST: 18 U/L (ref 15–41)
Albumin: 3.8 g/dL (ref 3.5–5.0)
Alkaline Phosphatase: 66 U/L (ref 38–126)
Anion gap: 10 (ref 5–15)
BUN: 13 mg/dL (ref 8–23)
CO2: 24 mmol/L (ref 22–32)
Calcium: 9.1 mg/dL (ref 8.9–10.3)
Chloride: 102 mmol/L (ref 98–111)
Creatinine, Ser: 0.72 mg/dL (ref 0.61–1.24)
GFR, Estimated: >60 mL/min (ref >60)
Glucose, Bld: 118 mg/dL — ABNORMAL HIGH (ref 70–99)
Potassium: 3.9 mmol/L (ref 3.5–5.1)
Sodium: 136 mmol/L (ref 135–145)
Total Bilirubin: 0.9 mg/dL (ref 0.3–1.2)
Total Protein: 6.3 g/dL — ABNORMAL LOW (ref 6.5–8.1)

## 2021-08-09 LAB — URINALYSIS, ROUTINE W REFLEX MICROSCOPIC
Bilirubin Urine: NEGATIVE
Glucose, UA: NEGATIVE mg/dL
Hgb urine dipstick: NEGATIVE
Ketones, ur: 20 mg/dL — AB
Leukocytes,Ua: NEGATIVE
Nitrite: NEGATIVE
Protein, ur: 100 mg/dL — AB
Specific Gravity, Urine: 1.026 (ref 1.005–1.030)
pH: 5 (ref 5.0–8.0)

## 2021-08-09 LAB — CBC WITH DIFFERENTIAL/PLATELET
Abs Immature Granulocytes: 0.04 10*3/uL (ref 0.00–0.07)
Basophils Absolute: 0 10*3/uL (ref 0.0–0.1)
Basophils Relative: 0 %
Eosinophils Absolute: 0 10*3/uL (ref 0.0–0.5)
Eosinophils Relative: 0 %
HCT: 44.7 % (ref 39.0–52.0)
Hemoglobin: 15.2 g/dL (ref 13.0–17.0)
Immature Granulocytes: 0 %
Lymphocytes Relative: 11 %
Lymphs Abs: 1.1 10*3/uL (ref 0.7–4.0)
MCH: 32.1 pg (ref 26.0–34.0)
MCHC: 34 g/dL (ref 30.0–36.0)
MCV: 94.3 fL (ref 80.0–100.0)
Monocytes Absolute: 1 10*3/uL (ref 0.1–1.0)
Monocytes Relative: 10 %
Neutro Abs: 7.5 10*3/uL (ref 1.7–7.7)
Neutrophils Relative %: 79 %
Platelets: 232 10*3/uL (ref 150–400)
RBC: 4.74 MIL/uL (ref 4.22–5.81)
RDW: 13.2 % (ref 11.5–15.5)
WBC: 9.6 10*3/uL (ref 4.0–10.5)
nRBC: 0 % (ref 0.0–0.2)

## 2021-08-09 LAB — TROPONIN I (HIGH SENSITIVITY): Troponin I (High Sensitivity): 8 ng/L (ref <18)

## 2021-08-09 MED ORDER — ONDANSETRON HCL 4 MG PO TABS: 4.0000 mg | ORAL_TABLET | Freq: Three times a day (TID) | ORAL | 0 refills | Status: DC | PRN

## 2021-08-09 MED ORDER — ONDANSETRON HCL 4 MG/2ML IJ SOLN: 4.0000 mg | Freq: Once | INTRAMUSCULAR | Status: DC

## 2021-08-09 MED ORDER — MECLIZINE HCL 25 MG PO TABS: 12.5000 mg | ORAL_TABLET | Freq: Three times a day (TID) | ORAL | 0 refills | Status: AC | PRN

## 2021-08-09 MED ORDER — ONDANSETRON 4 MG PO TBDP
4.0000 mg | ORAL_TABLET | Freq: Once | ORAL | Status: AC
  Administered 2021-08-09: 4 mg via ORAL
  Filled 2021-08-09: qty 1

## 2021-08-09 MED ORDER — MECLIZINE HCL 25 MG PO TABS
25.0000 mg | ORAL_TABLET | Freq: Once | ORAL | Status: AC
  Administered 2021-08-09: 25 mg via ORAL
  Filled 2021-08-09: qty 1

## 2021-08-09 NOTE — ED Provider Notes (Signed)
[facility] EMERGENCY DEPARTMENT Provider Note   CSN: [id number] Arrival date & time: 08/09/21  [id number]     History Chief Complaint  Patient presents with   Dizziness   Abnormal ECG    [name] is a 80 y.o. male  The history is provided by the patient.  Dizziness Quality:  Vertigo Severity:  Moderate Onset quality:  Sudden Duration:  1 day Timing:  Intermittent Progression:  Partially resolved Chronicity:  Recurrent Context: not when bending over and not with bowel movement       Past Medical History:  Diagnosis Date   Abdominal pain, right lateral 02/21/2012   progressive.     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   Cataract    COLONIC POLYPS, HX OF 10/18/2007   Cough 10/25/2010   DIZZINESS 01/22/2009   DYSPNEA ON EXERTION 08/21/2008   ECHOCARDIOGRAM, ABNORMAL 08/19/2007   GERD 07/25/2007   Heart murmur    HERNIA 07/06/2009   HX, PERSONAL, MALIGNANCY, SKIN NEC 10/18/2007   HYPERGLYCEMIA, BORDERLINE 12/31/2007   HYPERLIPIDEMIA 07/25/2007   HYPERTENSION 07/23/2007   Inguinal hernia    LUMBAR RADICULOPATHY, RIGHT 10/18/2007   MUSCLE PAIN 01/22/2009   Nausea    NEURITIS 10/25/2010   Palpitations 05/09/2010   RHINITIS, CHRONIC 05/11/2010   RUQ PAIN 01/22/2009   Sinus polyp    UNS ADVRS EFF OTH RX MEDICINAL&BIOLOGICAL SBSTNC 04/15/2008   VERRUCA VULGARIS 12/31/2007   VITAMIN D DEFICIENCY 11/02/2009   Voice strain     Patient  Active Problem List   Diagnosis Date Noted   Parkinson's disease ([place]) 04/05/2021   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   Allergic conjunctivitis of both eyes 04/22/2018   Neck pain on right side 11/03/2014   Urinary frequency 07/27/2014   Medicare annual wellness visit, initial 07/27/2014   Rash and nonspecific skin eruption 07/27/2014   Nodule, subcutaneous 07/27/2014   Atherosclerosis of aorta ([place]) 10/25/2013   Hyperglycemia 03/07/2013   Status post laparoscopic cholecystectomy 03/07/2013   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   Bilateral renal cysts 02/28/2012   Hepatic cyst 02/28/2012   Atherosclerosis 02/28/2012   Contact dermatitis 02/28/2012   Dermatitis 04/03/2011   BACK PAIN, LUMBAR 10/25/2010   NEURITIS 10/25/2010   RHINITIS, CHRONIC 05/11/2010   PALPITATIONS 05/09/2010   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   VITAMIN D DEFICIENCY 11/02/2009   BACK PAIN 11/02/2009   BLADDER NECK OBSTRUCTION 07/07/2009   ATRIAL FIBRILLATION 07/06/2009   CAD 01/22/2009   MUSCLE PAIN 01/22/2009   DYSPNEA ON EXERTION 08/21/2008   VERRUCA VULGARIS 12/31/2007   HYPERGLYCEMIA, BORDERLINE 12/31/2007   LUMBAR RADICULOPATHY, RIGHT 10/18/2007   HX, PERSONAL, MALIGNANCY, SKIN NEC 10/18/2007   COLONIC POLYPS, HX OF 10/18/2007   ECHOCARDIOGRAM, ABNORMAL 08/19/2007   HYPERLIPIDEMIA 07/25/2007   GERD 07/25/2007   BENIGN PROSTATIC HYPERTROPHY 07/25/2007  Essential hypertension 07/23/2007   ALLERGIC RHINITIS 07/23/2007    Past Surgical History:  Procedure Laterality Date   BACK SURGERY     basal cell and melanoma removed     face   BONY PELVIS SURGERY     CATARACT EXTRACTION     BIL   COLONOSCOPY     CORONARY ARTERY BYPASS GRAFT  2009   HERNIA REPAIR  7 times   inguinal   LAPAROSCOPIC CHOLECYSTECTOMY  03/03/2013   LUMBAR SPINE SURGERY     POLYPECTOMY     TONSILLECTOMY         Family History  Problem Relation Age of  Onset   Lung cancer Mother    Cancer Mother    Heart attack Father    Hypertension Father    Alcohol abuse Father    Heart disease Father    Breast cancer Sister    Cancer Sister        breast   Alcohol abuse Brother    Hypertension Brother    Alcohol abuse Brother    Hypertension Brother    Alcohol abuse Brother    Hypertension Brother    Kidney disease Sister    Colon cancer Neg Hx    Esophageal cancer Neg Hx    Rectal cancer Neg Hx    Stomach cancer Neg Hx     Social History   Tobacco Use   Smoking status: Former    Types: Cigarettes    Quit date: 04/02/1964    Years since quitting: 57.3   Smokeless tobacco: Never   Tobacco comments:    in early 20's he quit  Vaping Use   Vaping Use: Never used  Substance Use Topics   Alcohol use: Yes    Comment: socially   Drug use: No    Home Medications Prior to Admission medications   Medication Sig Start Date End Date Taking? Authorizing Provider  acetaminophen (TYLENOL) 500 MG tablet Take 500 mg by mouth in the morning and at bedtime.    [provider]  Ascorbic Acid (VITAMIN C PO) Take by mouth.    [provider]  aspirin 81 MG tablet Take 81 mg by mouth daily.    [provider]  carbidopa-levodopa (SINEMET IR) 25-100 MG tablet Take 1 tablet by mouth 3 (three) times daily. 9am/1pm/5pm 04/05/21   [name], [name], DO  Cholecalciferol (VITAMIN D3) 125 MCG (5000 UT) CAPS Take 1 capsule (5,000 Units total) by mouth daily. 05/20/21   Josue Hector, MD  clobetasol (OLUX) 0.05 % topical foam APPLY to back and leg 03/31/21   Robyne Askew R, PA-C  Coenzyme Q10 100 MG TABS Take 1 tablet by mouth daily.    [provider]  Cyanocobalamin (B-12) 100 MCG TABS Take 1 tablet by mouth. Once every three days    [provider]  DHA-EPA-Vitamin E (OMEGA-3 COMPLEX PO) Take 1,200 mg by mouth 3 (three) times daily. ProOmega LDL    [provider]  magnesium oxide (MAG-OX) 400 MG tablet  Take 400 mg daily by mouth.    [provider]  metoprolol tartrate (LOPRESSOR) 50 MG tablet TAKE 1/2 TABLET BY MOUTH IN THE MORNING AND 1 TABLET BY MOUTH IN THE EVENING 04/28/21   Panosh, Standley Brooking, MD  metroNIDAZOLE (METROCREAM) 0.75 % cream APPLY TO FACE 04/26/21   Lavonna Monarch, MD  MULTIPLE VITAMIN PO Take 1 tablet by mouth daily.    [provider]  MYRBETRIQ 25 MG [phone]  tablet Take 25 mg by mouth daily. 06/10/16   [provider]  nitroGLYCERIN (NITROSTAT) 0.4 MG SL tablet PLACE 1 TABLET UNDER TONGUE EVERY 5 MINUTES AS NEEDED FOR CHEST PAIN 03/08/20   Burtis Junes, NP  RABEprazole (ACIPHEX) 20 MG tablet TAKE 1 TABLET BY MOUTH EVERY DAY 04/26/21   Panosh, Standley Brooking, MD  Red Yeast Rice 600 MG CAPS Take 1 capsule by mouth daily.    [provider]  tamsulosin (FLOMAX) 0.4 MG CAPS capsule TAKE 1 CAPSULE BY MOUTH EVERY DAY 04/26/21   Panosh, Standley Brooking, MD    Allergies    Propoxyphene n-acetaminophen and Iodinated diagnostic agents  Review of Systems   Review of Systems  Neurological:  Positive for dizziness.  Ten systems reviewed and are negative for acute change, except as noted in the HPI.   Physical Exam Updated Vital Signs BP (!) 169/88   Pulse 71   Temp (!) 97.5 F (36.4 C) (Oral)   Resp 17   Ht '5\' 7"'$  (1.702 m)   Wt 92 kg   SpO2 93%   BMI 31.77 kg/m   Physical Exam Vitals and nursing note reviewed.  Constitutional:      General: He is not in acute distress.    Appearance: He is well-developed. He is not diaphoretic.  HENT:     Head: Normocephalic and atraumatic.  Eyes:     General: Gaze aligned appropriately. No scleral icterus.    Extraocular Movements:     Right eye: Normal extraocular motion.     Left eye: Normal extraocular motion.     Conjunctiva/sclera: Conjunctivae normal.     Comments: Mild Left sided only nystagmus  Cardiovascular:     Rate and Rhythm: Normal rate and regular rhythm.     Heart sounds: Normal heart sounds.   Pulmonary:     Effort: Pulmonary effort is normal. No respiratory distress.     Breath sounds: Normal breath sounds.  Abdominal:     Palpations: Abdomen is soft.     Tenderness: There is no abdominal tenderness. There is no guarding.  Musculoskeletal:     Cervical back: Normal range of motion and neck supple.  Skin:    General: Skin is warm and dry.  Neurological:     Mental Status: He is alert and oriented to person, place, and time.     Comments: Speech is clear and goal oriented, follows commands Major Cranial nerves without deficit, no facial droop Normal strength in upper and lower extremities bilaterally including dorsiflexion and plantar flexion, strong and equal grip strength Sensation normal to light and sharp touch Moves extremities without ataxia, coordination intact, movements are slow Normal finger to nose and rapid alternating movements No pronator drift Normal gait Normal heel-shin  Reproducible dizziness with turning head to the Left.  Psychiatric:        Behavior: Behavior normal.    ED Results / Procedures / Treatments   Labs (all labs ordered are listed, but only abnormal results are displayed) Labs Reviewed  COMPREHENSIVE METABOLIC PANEL - Abnormal; Notable for the following components:      Result Value   Glucose, Bld 118 (*)    Total Protein 6.3 (*)    All other components within normal limits  CBC WITH DIFFERENTIAL/PLATELET  URINALYSIS, ROUTINE W REFLEX MICROSCOPIC  TROPONIN I (HIGH SENSITIVITY)  TROPONIN I (HIGH SENSITIVITY)    EKG EKG Interpretation  Date/Time:  Tuesday August 09 2021 19:24:45 EDT Ventricular Rate:  66 PR  Interval:  212 QRS Duration: 92 QT Interval:  438 QTC Calculation: 459 R Axis:   21 Text Interpretation: Sinus rhythm with 1st degree A-V block Incomplete right bundle branch block Cannot rule out Anterior infarct , age undetermined Abnormal ECG Confirmed by Lennice Sites [id number]) on 08/09/2021 10:11:29 PM  Radiology DG  Chest 1 View  Result Date: 08/09/2021 CLINICAL DATA:  Dizziness and near syncope. EXAM: CHEST  1 VIEW COMPARISON:  November 03, 2013 FINDINGS: Multiple sternal wires and vascular clips are seen. There is no evidence of acute infiltrate, pleural effusion or pneumothorax. The heart size and mediastinal contours are within normal limits. There is marked severity calcification of the aortic arch. Multilevel degenerative changes are seen throughout the thoracic spine. IMPRESSION: Stable exam without active cardiopulmonary disease. Electronically Signed   By: Virgina Norfolk M.D.   On: 08/09/2021 19:51    Procedures Procedures   Medications Ordered in ED Medications - No data to display  ED Course  I have reviewed the triage vital signs and the nursing notes.  Pertinent labs & imaging results that were available during my care of the patient were reviewed by me and considered in my medical decision making (see chart for details).    MDM Rules/Calculators/A&P                          80 year old male who presents with dizziness/vertigo symptoms. The emergent differential diagnosis for acute vertigo low includes peripheral causes such as BPPV, barotrauma, ear foreign body, Mnire's disease, infectious causes such as lip bronchitis, vestibular neuritis or Ramsay Hunt syndrome.  Other emergent causes are central such as cerebellar stroke, vertebrobasilar insufficiency, neoplastic causes, vertebral artery dissection, MS, neurosyphilis or tuberculosis, epilepsy or migraine.  Other causes include anemia, hyperviscosity syndrome, alcohol or aminoglycoside use, renal failure, hypoglycemia and thyroid disease.  Seen in shared visit with Dr. Ronnald Nian.  Patient sent over from urgent care due to a first-degree AV block.  This does not represent an emergent cause of dizziness.  I reviewed his EKG he does appear to have first-degree AV block.  But also has normal sinus rhythm at a rate of 66.  No other  arrhythmias noted.  Patient and his wife feel that this is most likely due to vertigo.  It feels similar to his previous episodes.  His nausea is intermittent and fleeting and worse when he turns his body or head toward the left side.  In shared decision-making with the patient and his wife patient does not wish to proceed with MRI.  I reviewed the since labs which include CBC, CMP, troponin and urinalysis all without significant abnormality.  I reviewed a 1 view chest x-ray which shows no acute abnormality.  Patient given meclizine and Zofran.  I feel this is very likely peripheral vertigo.  My attending is also in agreement and patient will be discharged home with meclizine and Zofran.  Discussed return precautions.  Given outpatient follow-up with ENT.. Final Clinical Impression(s) / ED Diagnoses Final diagnoses:  Near syncope    Rx / DC Orders ED Discharge Orders     None        Margarita Mail, PA-C 08/09/21 [id number]    Lennice Sites, DO 08/09/21 2358

## 2021-08-09 NOTE — ED Provider Notes (Signed)
I personally evaluated [name] during Jon encounter and completed a history, physical, procedures, medical decision making to contribute to Jon overall care of [name] and decision making for [name] briefly, [name] is a 80 y.o. male with intermittent dizziness, nausea.  Chambers with history of Parkinson's disease, vertigo.  EKG shows sinus rhythm with first-degree heart block.  No ischemic changes.  Denies any chest pain, shortness of breath.  He has been having intermittent nausea and dizziness feelings especially when he turns to his left.  Has been ambulatory with his walker otherwise.  Symptoms are not persistent.  Neurologically he appears intact.  He is ambulatory without any symptoms.  He has normal finger-to-nose finger and normal heel-to-shin.  He does get nauseous when he looks to Jon left on exam with maybe some mild horizontal nystagmus.  There is no vertical nystagmus.  He denies any headache.  Overall symptoms appear to be consistent with a peripheral vertigo.  He was given meclizine and Zofran with improvement.  Neurologically appears to be intact and have very low suspicion for stroke.  We were able to ambulate together and he did not have any symptoms.  Did discuss about how sometimes Jon symptoms can be concerning for stroke but I felt comfortable that this was unlikely.  Chambers felt comfortable with meclizine and Zofran at home as it did feel similar to his prior vertigo symptoms.  Overall he understands return precautions and was discharged in ED in good condition.  This chart was dictated using voice recognition software.  Despite best efforts to proofread,  errors can occur which can change Jon documentation meaning.    EKG Interpretation  Date/Time:  Tuesday August 09 2021 19:24:45 EDT Ventricular Rate:  66 PR Interval:  212 QRS Duration: 92 QT Interval:  438 QTC Calculation: 459 R Axis:   21 Text Interpretation: Sinus rhythm with 1st degree A-V block  Incomplete right bundle branch block Cannot rule out Anterior infarct , age undetermined Abnormal ECG Confirmed by Lennice Sites [phone]) on 08/09/2021 10:11:29 PM            Lennice Sites, DO 08/09/21 2331

## 2021-08-09 NOTE — Progress Notes (Deleted)
Assessment/Plan:   1.  Parkinsons Disease,   although atypical state like PSP cannot be r/o given diplopia, anterocollis and urinary issues early (likely due to prostate issues)  -***  -MBE in May, 2022 was normal.   Subjective:   [name] was seen today in follow up for parkinsonism.  My previous records were reviewed prior to todays visit as well as outside records available to me. Pt denies falls.  Pt denies lightheadedness, near syncope.  No hallucinations.  Mood has been good.  Patient had modified barium swallow in May, 2022.  Records reviewed.  Swallow function was normal.  Did attend therapies [facility].  Current prescribed movement disorder medications: ***carbidopa/levodopa 25/100, tid (started last visit)   PREVIOUS MEDICATIONS: {Parkinson's RX:18200}  ALLERGIES:   Allergies  Allergen Reactions   Propoxyphene N-Acetaminophen Nausea And Vomiting   Iodinated Diagnostic Agents Swelling    Pt developed slight lt upper lip swelling only to one side about 15 minutes post IV contrast injection. No other symptoms.     CURRENT MEDICATIONS:  Outpatient Encounter Medications as of 08/10/2021  Medication Sig   acetaminophen (TYLENOL) 500 MG tablet Take 500 mg by mouth in the morning and at bedtime.   Ascorbic Acid (VITAMIN C PO) Take by mouth.   aspirin 81 MG tablet Take 81 mg by mouth daily.   carbidopa-levodopa (SINEMET IR) 25-100 MG tablet Take 1 tablet by mouth 3 (three) times daily. 9am/1pm/5pm   Cholecalciferol (VITAMIN D3) 125 MCG (5000 UT) CAPS Take 1 capsule (5,000 Units total) by mouth daily.   clobetasol (OLUX) 0.05 % topical foam APPLY to back and leg   Coenzyme Q10 100 MG TABS Take 1 tablet by mouth daily.   Cyanocobalamin (B-12) 100 MCG TABS Take 1 tablet by mouth. Once every three days   DHA-EPA-Vitamin E (OMEGA-3 COMPLEX PO) Take 1,200 mg by mouth 3 (three) times daily. ProOmega LDL   magnesium oxide (MAG-OX) 400 MG tablet Take 400 mg  daily by mouth.   metoprolol tartrate (LOPRESSOR) 50 MG tablet TAKE 1/2 TABLET BY MOUTH IN THE MORNING AND 1 TABLET BY MOUTH IN THE EVENING   metroNIDAZOLE (METROCREAM) 0.75 % cream APPLY TO FACE   MULTIPLE VITAMIN PO Take 1 tablet by mouth daily.   MYRBETRIQ 25 MG TB24 tablet Take 25 mg by mouth daily.   nitroGLYCERIN (NITROSTAT) 0.4 MG SL tablet PLACE 1 TABLET UNDER TONGUE EVERY 5 MINUTES AS NEEDED FOR CHEST PAIN   RABEprazole (ACIPHEX) 20 MG tablet TAKE 1 TABLET BY MOUTH EVERY DAY   Red Yeast Rice 600 MG CAPS Take 1 capsule by mouth daily.   tamsulosin (FLOMAX) 0.4 MG CAPS capsule TAKE 1 CAPSULE BY MOUTH EVERY DAY   Facility-Administered Encounter Medications as of 08/10/2021  Medication   0.9 %  sodium chloride infusion    Objective:   PHYSICAL EXAMINATION:    VITALS:  There were no vitals filed for this visit.  GEN:  The patient appears stated age and is in NAD. HEENT:  Normocephalic, atraumatic.  The mucous membranes are moist. The superficial temporal arteries are without ropiness or tenderness. CV:  RRR Lungs:  CTAB Neck/HEME:  There are no carotid bruits bilaterally.  Neurological examination:  Orientation: The patient is alert and oriented x3. Cranial nerves: There is good facial symmetry with*** facial hypomimia. The speech is fluent and clear. Soft palate rises symmetrically and there is no tongue deviation. Hearing is intact to conversational tone. Sensation: Sensation is  intact to light touch throughout Motor: Strength is at least antigravity x4.  Movement examination: Tone: There is mild increased tone in the RUE Abnormal movements: none Coordination:  There is decremation with RAM's, with any form of RAMS, including alternating supination and pronation of the forearm, hand opening and closing, finger taps, heel taps and toe taps, R>L Gait and Station: The patient has difficulty arising out of a deep-seated chair without the use of the hands. The patient's stride  length is decreased.  He is flexed at the waist.  Slightly drags the R leg.   I have reviewed and interpreted the following labs independently    Chemistry      Component Value Date/Time   NA 141 03/28/2021 1430   NA 141 07/14/2020 1407   K 4.1 03/28/2021 1430   CL 106 03/28/2021 1430   CO2 25 03/28/2021 1430   BUN 17 03/28/2021 1430   BUN 15 07/14/2020 1407   CREATININE 0.76 03/28/2021 1430      Component Value Date/Time   CALCIUM 9.8 03/28/2021 1430   ALKPHOS 72 10/13/2019 1523   AST 13 10/13/2019 1523   ALT 19 10/13/2019 1523   BILITOT 0.6 10/13/2019 1523       Lab Results  Component Value Date   WBC 7.6 03/28/2021   HGB 15.6 03/28/2021   HCT 44.9 03/28/2021   MCV 93.5 03/28/2021   PLT 250.0 03/28/2021    Lab Results  Component Value Date   TSH 1.49 03/28/2021     Total time spent on today's visit was ***30 minutes, including both face-to-face time and nonface-to-face time.  Time included that spent on review of records (prior notes available to me/labs/imaging if pertinent), discussing treatment and goals, answering patient's questions and coordinating care.  Cc:  Panosh, Standley Brooking, MD

## 2021-08-09 NOTE — ED Triage Notes (Signed)
Reports of dizziness and near syncope episode since last night. Went to urgent care today and had abnormal EKG - sinus rhythm and 1st degree heart block.

## 2021-08-09 NOTE — Discharge Instructions (Addendum)
Contact a health care provider if: Your medicines do not relieve your vertigo or they make it worse. Your condition gets worse or you develop new symptoms. You have a fever. You develop nausea or vomiting, or if nausea gets worse. Your family or friends notice any behavioral changes. You have numbness or a prickling and tingling sensation in part of your body. Get help right away if you: Are always dizzy or you faint. Develop severe headaches. Develop a stiff neck. Develop sensitivity to light. Have difficulty moving or speaking. Have weakness in your hands, arms, or legs. Have changes in your hearing or vision.

## 2021-08-09 NOTE — ED Provider Notes (Signed)
Emergency Medicine Provider Triage Evaluation Note  [name] , a 80 y.o. male  was evaluated in triage.  Pt complains of near syncope   Review of Systems  Positive: Weakness sweaty Negative: No fever   Physical Exam  BP (!) 166/82 (BP Location: Left Arm)   Pulse 74   Temp (!) 97.5 F (36.4 C) (Oral)   Resp 16   Ht '5\' 7"'$  (1.702 m)   Wt 92 kg   SpO2 96%   BMI 31.77 kg/m  Gen:   Awake, no distress  Resp:  Normal effort  MSK:   Moves extremities without difficulty  Other:    Medical Decision Making  Medically screening exam initiated at 7:35 PM.  Appropriate orders placed.  [facility] was informed that the remainder of the evaluation will be completed by another provider, this initial triage assessment does not replace that evaluation, and the importance of remaining in the ED until their evaluation is complete.      Fransico Meadow, PA-C 08/09/21 1936    Jeanell Sparrow, DO 08/10/21 0104

## 2021-08-10 ENCOUNTER — Ambulatory Visit: Payer: Medicare Other | Admitting: Neurology

## 2021-08-10 LAB — TROPONIN I (HIGH SENSITIVITY): Troponin I (High Sensitivity): 12 ng/L (ref <18)

## 2021-08-10 NOTE — ED Notes (Signed)
Lab just now running troponin, reports lab being placed in a safe rack and not ran at time it was sent

## 2021-08-11 ENCOUNTER — Ambulatory Visit: Payer: Medicare Other | Admitting: Neurology

## 2021-08-15 ENCOUNTER — Telehealth: Payer: Self-pay

## 2021-08-15 NOTE — Telephone Encounter (Signed)
I spoke with the pt's wife and she stated that the pt is having severe neck pain that started yesterday. Pt's wife reported that he took Tylenol and pain medication from previous back surgery with little relief. Pt's wife states that she had contacted Dr. Roosvelt Harps office with Neurosurgery and is awaiting a call back from their office. Pt declined to visit ED as he had to wait a long time during his last visit on 08/09/2021.

## 2021-08-15 NOTE — Telephone Encounter (Signed)
Wife of patient called stating patient is having severe neck pain nothing she has given him is working and would like advice on what should be done. Would like a call back

## 2021-08-15 NOTE — Progress Notes (Signed)
Virtual Visit Via Video   The purpose of this virtual visit is to provide medical care while limiting exposure to the novel coronavirus.    Consent was obtained for video visit:  Yes.   Answered questions that patient had about telehealth interaction:  Yes.   I discussed the limitations, risks, security and privacy concerns of performing an evaluation and management service by telemedicine. I also discussed with the patient that there may be a patient responsible charge related to this service. The patient expressed understanding and agreed to proceed.  Pt location: Home Physician Location: office Name of referring provider:  Panosh, Standley Brooking, MD I connected with [name] at patients initiation/request on 08/24/2022at  9:45 AM EDTby video enabled telemedicine application and verified that I am speaking with the correct person using two identifiers. Pt MRN:  [id number] Pt DOB:  [birth year] Video Participants:  pt; wife Assessment/Plan:   1.  Parkinsons Disease,   although atypical state like PSP cannot be r/o given diplopia, anterocollis and urinary issues early (likely due to prostate issues)  -Discontinue carbidopa/levodopa 25/100 immediate release (in case contributing to nausea)  -Start carbidopa/levodopa 25/100 CR, 1 tablet 3 times per day at 9 AM/1 PM/5 PM  -Discussed extensively how levodopa interacts with protein.  -MBE in May, 2022 was normal.  -Wife requests referral to [facility].  We will send referral.  -We will [facility] physical [facility] via [facility] (patient prefers)  2.  Vertigo  -hx of bppv  -improved  3.  Constipation  -discussed nature and pathophysiology and association with PD  -discussed importance of hydration.  Pt is to increase water intake  -pt is given a copy of the rancho recipe  -recommended daily colace  -recommended miralax prn  -asks for RX for suppository.  Told them that this is something that they will need to  follow-up with his primary care about.  They did state that they called and tried to get an appointment, but no one called them back.  I told them to go ahead and call again.  4.  Nausea  -not convinced all related to levodopa since has nausea upon awakening in the AM.  Nonetheless, as above, I am switching to the CR version of levodopa to see if it will help.  -having trouble with GERD and wonder if nausea related to that.  Will send back to [facility] - saw Dr. Ardis Hughs in the past.  -He asks for a prescription for Zofran.  I told him I would give him 1 prescription, but if nausea persists now that I have changed his medication, he will need to follow-up with primary care about that.  5..  Vasomotor rhinorrea  -discussed that this can be associated with Parkinsons Disease   6.  New urinary incontinence  -Patient to follow-up with primary care.   Subjective:   [name] was seen today in follow up for parkinsonism.  My previous records were reviewed prior to todays visit as well as outside records available to me. Changed todays visit to [facility] because pt couldn't get to office b/c of bladder issues and neck pain.  Levodopa started last visit and reports that having nausea with the med.  Pt denies falls.  Pt denies lightheadedness, near syncope.  No hallucinations.  Mood has been good.  Patient had modified barium swallow in May, 2022.  Records reviewed.  Swallow function was normal.  Did attend therapies [facility].  Patient  did have an appointment here last week, but ended up in the emergency room all night and was just too tired to come.  Emergency room records are reviewed.  He complained of dizziness, especially when he turned to the left with his head.  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  Pt thinks that issue is from Parkinsons Disease but wife reports issue with vertigo in the past with same sx's.   Biggest issue is nausea now.  He has this even on  awakening, before he takes med.  Has had constipation.  Having bad reflux.  Comes up in the mouth.  Also c/o frequent urination/incontinence (new)  Current prescribed movement disorder medications: carbidopa/levodopa 25/100, tid (started last visit)    ALLERGIES:   Allergies  Allergen Reactions   Propoxyphene N-Acetaminophen Nausea And Vomiting   Iodinated Diagnostic Agents Swelling    Pt developed slight lt upper lip swelling only to one side about 15 minutes post IV contrast injection. No other symptoms.     CURRENT MEDICATIONS:  Outpatient Encounter Medications as of 08/17/2021  Medication Sig   acetaminophen (TYLENOL) 500 MG tablet Take 500 mg by mouth in the morning and at bedtime.   Ascorbic Acid (VITAMIN C PO) Take by mouth.   aspirin 81 MG tablet Take 81 mg by mouth daily.   carbidopa-levodopa (SINEMET IR) 25-100 MG tablet Take 1 tablet by mouth 3 (three) times daily. 9am/1pm/5pm   Cholecalciferol (VITAMIN D3) 125 MCG (5000 UT) CAPS Take 1 capsule (5,000 Units total) by mouth daily.   clobetasol (OLUX) 0.05 % topical foam APPLY to back and leg   Coenzyme Q10 100 MG TABS Take 1 tablet by mouth daily.   Cyanocobalamin (B-12) 100 MCG TABS Take 1 tablet by mouth. Once every three days   DHA-EPA-Vitamin E (OMEGA-3 COMPLEX PO) Take 1,200 mg by mouth 3 (three) times daily. ProOmega LDL   magnesium oxide (MAG-OX) 400 MG tablet Take 400 mg daily by mouth.   meclizine (ANTIVERT) 25 MG tablet Take 0.5-1 tablets (12.5-25 mg total) by mouth 3 (three) times daily as needed for dizziness.   metoprolol tartrate (LOPRESSOR) 50 MG tablet TAKE 1/2 TABLET BY MOUTH IN THE MORNING AND 1 TABLET BY MOUTH IN THE EVENING   metroNIDAZOLE (METROCREAM) 0.75 % cream APPLY TO FACE   MULTIPLE VITAMIN PO Take 1 tablet by mouth daily.   MYRBETRIQ 25 MG TB24 tablet Take 25 mg by mouth daily.   nitroGLYCERIN (NITROSTAT) 0.4 MG SL tablet PLACE 1 TABLET UNDER TONGUE EVERY 5 MINUTES AS NEEDED FOR CHEST PAIN    ondansetron (ZOFRAN) 4 MG tablet Take 1 tablet (4 mg total) by mouth every 8 (eight) hours as needed for nausea or vomiting.   RABEprazole (ACIPHEX) 20 MG tablet TAKE 1 TABLET BY MOUTH EVERY DAY   Red Yeast Rice 600 MG CAPS Take 1 capsule by mouth daily.   tamsulosin (FLOMAX) 0.4 MG CAPS capsule TAKE 1 CAPSULE BY MOUTH EVERY DAY   Facility-Administered Encounter Medications as of 08/17/2021  Medication   0.9 %  sodium chloride infusion    Objective:   PHYSICAL EXAMINATION:    VITALS:   Vitals:   08/17/21 0903  Weight: 202 lb (91.6 kg)  Height: '5\' 7"'$  (1.702 m)    GEN:  The patient appears stated age and is in NAD.  Neurological examination:  Orientation: The patient is alert and oriented x3. Cranial nerves: There is good facial symmetry. There is facial hypomimia.  The speech is fluent and clear. Soft palate rises symmetrically and there is no tongue deviation. Hearing is intact to conversational tone. Motor: Strength is at least antigravity x 4.   Shoulder shrug is equal and symmetric.  There is no pronator drift.  Movement examination: Tone: unable Abnormal movements: none noted Coordination: Patient did fairly well with hand opening and closing, finger taps and the action of "turning a light bulb." Gait and Station: Patient pushes off of the chair to arise.  Pt is short stepped (has walker but didn't use it much for this)  I have reviewed and interpreted the following labs independently    Chemistry      Component Value Date/Time   NA 136 08/09/2021 2000   NA 141 07/14/2020 1407   K 3.9 08/09/2021 2000   CL 102 08/09/2021 2000   CO2 24 08/09/2021 2000   BUN 13 08/09/2021 2000   BUN 15 07/14/2020 1407   CREATININE 0.72 08/09/2021 2000      Component Value Date/Time   CALCIUM 9.1 08/09/2021 2000   ALKPHOS 66 08/09/2021 2000   AST 18 08/09/2021 2000   ALT 7 08/09/2021 2000   BILITOT 0.9 08/09/2021 2000   BILITOT 0.6 10/13/2019 1523       Lab Results   Component Value Date   WBC 9.6 08/09/2021   HGB 15.2 08/09/2021   HCT 44.7 08/09/2021   MCV 94.3 08/09/2021   PLT 232 08/09/2021    Lab Results  Component Value Date   TSH 1.49 03/28/2021     Follow up Instructions      -I discussed the assessment and treatment plan with the patient. The patient was provided an opportunity to ask questions and all were answered. The patient agreed with the plan and demonstrated an understanding of the instructions.   The patient was advised to call back or seek an in-person evaluation if the symptoms worsen or if the condition fails to improve as anticipated.    Total time spent on today's visit was 40 minutes, including both face-to-face time and nonface-to-face time.  Time included that spent on review of records (prior notes available to me/labs/imaging if pertinent), discussing treatment and goals, answering patient's questions and coordinating care.  Cc:  Panosh, Standley Brooking, MD

## 2021-08-17 ENCOUNTER — Other Ambulatory Visit: Payer: Self-pay

## 2021-08-17 ENCOUNTER — Encounter: Payer: Self-pay | Admitting: Neurology

## 2021-08-17 ENCOUNTER — Telehealth (INDEPENDENT_AMBULATORY_CARE_PROVIDER_SITE_OTHER): Payer: Medicare Other | Admitting: Neurology

## 2021-08-17 VITALS — Ht 67.0 in | Wt 202.0 lb

## 2021-08-17 DIAGNOSIS — K5901 Slow transit constipation: Secondary | ICD-10-CM | POA: Diagnosis not present

## 2021-08-17 DIAGNOSIS — R32 Unspecified urinary incontinence: Secondary | ICD-10-CM | POA: Diagnosis not present

## 2021-08-17 DIAGNOSIS — R110 Nausea: Secondary | ICD-10-CM | POA: Diagnosis not present

## 2021-08-17 DIAGNOSIS — G20 Parkinson's disease: Secondary | ICD-10-CM | POA: Diagnosis not present

## 2021-08-17 DIAGNOSIS — R42 Dizziness and giddiness: Secondary | ICD-10-CM | POA: Diagnosis not present

## 2021-08-17 MED ORDER — ONDANSETRON HCL 4 MG PO TABS: 4.0000 mg | ORAL_TABLET | Freq: Three times a day (TID) | ORAL | 0 refills | Status: DC | PRN

## 2021-08-17 MED ORDER — CARBIDOPA-LEVODOPA ER 25-100 MG PO TBCR: 1.0000 | EXTENDED_RELEASE_TABLET | Freq: Three times a day (TID) | ORAL | 1 refills | Status: DC

## 2021-08-18 ENCOUNTER — Encounter: Payer: Self-pay | Admitting: Internal Medicine

## 2021-08-18 ENCOUNTER — Telehealth (INDEPENDENT_AMBULATORY_CARE_PROVIDER_SITE_OTHER): Payer: Medicare Other | Admitting: Internal Medicine

## 2021-08-18 VITALS — Ht 67.0 in | Wt 202.0 lb

## 2021-08-18 DIAGNOSIS — Z79899 Other long term (current) drug therapy: Secondary | ICD-10-CM

## 2021-08-18 DIAGNOSIS — G20 Parkinson's disease: Secondary | ICD-10-CM | POA: Diagnosis not present

## 2021-08-18 DIAGNOSIS — M542 Cervicalgia: Secondary | ICD-10-CM | POA: Diagnosis not present

## 2021-08-18 DIAGNOSIS — K5901 Slow transit constipation: Secondary | ICD-10-CM

## 2021-08-18 DIAGNOSIS — N3941 Urge incontinence: Secondary | ICD-10-CM

## 2021-08-18 DIAGNOSIS — R350 Frequency of micturition: Secondary | ICD-10-CM

## 2021-08-18 MED ORDER — BACLOFEN 5 MG PO TABS: 5.0000 mg | ORAL_TABLET | Freq: Three times a day (TID) | ORAL | 0 refills | Status: DC

## 2021-08-18 NOTE — Progress Notes (Signed)
Virtual Visit via Video Note  I connected withNAME@ on 08/18/21 at 10:00 AM EDT by a video enabled telemedicine application and verified that I am speaking with the correct person using two identifiers. Location patient: home Location provider: home office Persons participating in the virtual visit: patient, provider and spouse Bruce Donath national recommendations  regarding [place] 19 pandemic   video visit is advised over in office visit for this patient.  Patient aware  of the limitations of evaluation and management by telemedicine and  availability of in person appointments. and agreed to proceed.   HPI: [name] presents for video visit today because of ongoing problem with constipation and the last year necessitated an ED visit.  He has been having issues with this for a while and some related to Parkinson's question medicine Has been taking MiraLAX once a day no diarrhea no blood had bowel movement 2 days ago with help symptoms.  No narcotics.  Asks about suppositories had a visit with Dr. Carles Collet this week and went over the multiple symptoms that he has and she suggested get with PCP more about constipation even though could be related to his Parkinson's and medicines.  Recently has had acute neck pain radiating down his right thigh possibly related to increase activity using heat cold Tylenol.  Had some leftover baclofen has some mild help.  Had ED visit for dizziness vertigo which could be related to the Parkinson's and medicines that was recently changed to help reduce side effects. She was going to do a referral for palliative care and had home advanced care for rehab to help. Recently seen for kidney stone and follow-up had to wait long in the office does not want to go back to [facility].  Nocturia has been problem and sometimes he reduces his oral intake.  The plan is to reduce liquid intake after 3:00 but been hydrating the rest of the day.     ROS: See pertinent  positives and negatives per HPI. Drooling   hiccoughs neck pain constipation  no cv sx no falling.  No vomiting   Past Medical History:  Diagnosis Date   Abdominal pain, right lateral 02/21/2012   progressive.     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   Cataract    COLONIC POLYPS, HX OF 10/18/2007   Cough 10/25/2010   DIZZINESS 01/22/2009   DYSPNEA ON EXERTION 08/21/2008   ECHOCARDIOGRAM, ABNORMAL 08/19/2007   GERD 07/25/2007   Heart murmur    HERNIA 07/06/2009   HX, PERSONAL, MALIGNANCY, SKIN NEC 10/18/2007   HYPERGLYCEMIA, BORDERLINE 12/31/2007   HYPERLIPIDEMIA 07/25/2007   HYPERTENSION 07/23/2007   Inguinal hernia    LUMBAR RADICULOPATHY, RIGHT 10/18/2007   MUSCLE PAIN 01/22/2009   Nausea    NEURITIS 10/25/2010   Palpitations 05/09/2010   RHINITIS, CHRONIC 05/11/2010   RUQ PAIN 01/22/2009   Sinus polyp  UNS ADVRS EFF OTH RX MEDICINAL&BIOLOGICAL SBSTNC 04/15/2008   VERRUCA VULGARIS 12/31/2007   VITAMIN D DEFICIENCY 11/02/2009   Voice strain     Past Surgical History:  Procedure Laterality Date   BACK SURGERY     basal cell and melanoma removed     face   BONY PELVIS SURGERY     CATARACT EXTRACTION     BIL   COLONOSCOPY     CORONARY ARTERY BYPASS GRAFT  2009   HERNIA REPAIR  7 times   inguinal   LAPAROSCOPIC CHOLECYSTECTOMY  03/03/2013   LUMBAR SPINE SURGERY     POLYPECTOMY     TONSILLECTOMY      Family History  Problem Relation Age of Onset   Lung  cancer Mother    Cancer Mother    Heart attack Father    Hypertension Father    Alcohol abuse Father    Heart disease Father    Breast cancer Sister    Cancer Sister        breast   Alcohol abuse Brother    Hypertension Brother    Alcohol abuse Brother    Hypertension Brother    Alcohol abuse Brother    Hypertension Brother    Kidney disease Sister    Colon cancer Neg Hx    Esophageal cancer Neg Hx    Rectal cancer Neg Hx    Stomach cancer Neg Hx     Social History   Tobacco Use   Smoking status: Former    Types: Cigarettes    Quit date: 04/02/1964    Years since quitting: 57.4   Smokeless tobacco: Never   Tobacco comments:    in early 20's he quit  Vaping Use   Vaping Use: Never used  Substance Use Topics   Alcohol use: Yes    Comment: socially   Drug use: No      Current Outpatient Medications:    acetaminophen (TYLENOL) 500 MG tablet, Take 500 mg by mouth in the morning and at bedtime., Disp: , Rfl:    Ascorbic Acid (VITAMIN C PO), Take by mouth., Disp: , Rfl:    aspirin 81 MG tablet, Take 81 mg by mouth daily., Disp: , Rfl:    Carbidopa-Levodopa ER (SINEMET CR) 25-100 MG tablet controlled release, Take 1 tablet by mouth 3 (three) times daily., Disp: 270 tablet, Rfl: 1   Cholecalciferol (VITAMIN D3) 125 MCG (5000 UT) CAPS, Take 1 capsule (5,000 Units total) by mouth daily., Disp: 90 capsule, Rfl: 3   clobetasol (OLUX) 0.05 % topical foam, APPLY to back and leg, Disp: 50 g, Rfl: 10   Coenzyme Q10 100 MG TABS, Take 1 tablet by mouth daily., Disp: , Rfl:    Cyanocobalamin (B-12) 100 MCG TABS, Take 1 tablet by mouth. Once every three days, Disp: , Rfl:    DHA-EPA-Vitamin E (OMEGA-3 COMPLEX PO), Take 1,200 mg by mouth 3 (three) times daily. ProOmega LDL, Disp: , Rfl:    magnesium oxide (MAG-OX) 400 MG tablet, Take 400 mg daily by mouth., Disp: , Rfl:    meclizine (ANTIVERT) 25 MG tablet, Take 0.5-1 tablets (12.5-25 mg total) by mouth 3 (three) times daily as needed  for dizziness., Disp: 30 tablet, Rfl: 0   metoprolol tartrate (LOPRESSOR) 50 MG tablet, TAKE 1/2 TABLET BY MOUTH IN THE MORNING AND 1 TABLET BY MOUTH IN THE EVENING, Disp: 135 tablet, Rfl: 1   metroNIDAZOLE (METROCREAM) 0.75 % cream, APPLY TO FACE, Disp: 45 g, Rfl: 0  MULTIPLE VITAMIN PO, Take 1 tablet by mouth daily., Disp: , Rfl:    MYRBETRIQ 25 MG TB24 tablet, Take 25 mg by mouth daily., Disp: , Rfl: 0   nitroGLYCERIN (NITROSTAT) 0.4 MG SL tablet, PLACE 1 TABLET UNDER TONGUE EVERY 5 MINUTES AS NEEDED FOR CHEST PAIN, Disp: 25 tablet, Rfl: 3   ondansetron (ZOFRAN) 4 MG tablet, Take 1 tablet (4 mg total) by mouth every 8 (eight) hours as needed for nausea or vomiting., Disp: 30 tablet, Rfl: 0   RABEprazole (ACIPHEX) 20 MG tablet, TAKE 1 TABLET BY MOUTH EVERY DAY, Disp: 90 tablet, Rfl: 0   Red Yeast Rice 600 MG CAPS, Take 1 capsule by mouth daily., Disp: , Rfl:    tamsulosin (FLOMAX) 0.4 MG CAPS capsule, TAKE 1 CAPSULE BY MOUTH EVERY DAY, Disp: 90 capsule, Rfl: 0   Baclofen 5 MG TABS, Take 5 mg by mouth 3 (three) times daily. As needed for neck spasm caution for sedation, Disp: 30 tablet, Rfl: 0  Current Facility-Administered Medications:    0.9 %  sodium chloride infusion, 500 mL, Intravenous, Continuous, Milus Banister, MD  EXAM: BP Readings from Last 3 Encounters:  08/10/21 (!) 171/91  06/07/21 140/86  04/05/21 124/70    VITALS per patient if applicable:  GENERAL: alert, oriented, appears well and in no acute distress  neck foreshortneed   HEENT: atraumatic, conjunttiva clear, no obvious abnormalities on inspection of external nose and ears  NECK: dec  motion neck   LUNGS: on inspection no signs of respiratory distress, breathing rate appears normal, no obvious gross SOB, gasping or wheezing  CV: no obvious cyanosis  PSYCH/NEURO: pleasant and cooperative, no obvious depression or anxiety, speech and thought processing grossly intact slow speech   Lab Results  Component Value  Date   WBC 9.6 08/09/2021   HGB 15.2 08/09/2021   HCT 44.7 08/09/2021   PLT 232 08/09/2021   GLUCOSE 118 (H) 08/09/2021   CHOL 180 10/13/2019   TRIG 126 10/13/2019   HDL 38 (L) 10/13/2019   LDLDIRECT 187.2 03/31/2008   LDLCALC 119 (H) 10/13/2019   ALT 7 08/09/2021   AST 18 08/09/2021   NA 136 08/09/2021   K 3.9 08/09/2021   CL 102 08/09/2021   CREATININE 0.72 08/09/2021   BUN 13 08/09/2021   CO2 24 08/09/2021   TSH 1.49 03/28/2021   PSA 1.49 02/21/2019   INR 1.1 (H) 01/25/2012   HGBA1C 6.0 03/28/2021    ASSESSMENT AND PLAN:  Discussed the following assessment and plan:    ICD-10-CM   1. 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 Neck pain on right side  M54.2     3. Urge incontinence  N39.41     4. Urine frequency  R35.0     5. Parkinson's disease ([place])  G20     6. Medication management  Z79.899      Unless interfering with medication absorption with increased the MiraLAX and titrate until stools are soft.  Not to help transit but may help firmness of stool.  Can also use over-the-counter Dulcolax  supp 3 days in a row to empty the lower bowel. Consider possibly other medications? Linzesse for slow transit constipation as there is no signs of obstruction and dietary changes although will continue her uncertain affect.  Stay hydrated in the day  Let us know about  help getting a new urology team  Neck pain is most certainly aggravation of his spinal disease but no  alarm symptoms except for pain.  We will refill the baclofen local care mobility issues may be aggravating.  He is currently not on narcotics. Dr. Carles Collet was to make advanced home care rehab and palliative care help, which I agree. Dietary changes and avoiding eating late at night would help reflux also.  Did not address medication at this time is on ppi . Counseled.   Expectant management and discussion of plan and treatment with opportunity to ask questions and all were answered. The patient agreed with the  plan and demonstrated an understanding of the instructions.   Advised to call back or seek an in-person evaluation if worsening  or having  further concerns . Return if symptoms worsen or fail to improve, for and when due .  For fu .    Shanon Ace, MD

## 2021-08-20 ENCOUNTER — Other Ambulatory Visit: Payer: Self-pay | Admitting: Internal Medicine

## 2021-08-23 ENCOUNTER — Other Ambulatory Visit: Payer: Self-pay | Admitting: Internal Medicine

## 2021-08-23 DIAGNOSIS — Z23 Encounter for immunization: Secondary | ICD-10-CM | POA: Diagnosis not present

## 2021-08-25 ENCOUNTER — Telehealth: Payer: Self-pay | Admitting: Pharmacist

## 2021-08-25 NOTE — Progress Notes (Signed)
Chronic Care Management Pharmacy Assistant   Name: [name]  MRN: [id number] DOB: [birth year]   Reason for Encounter: Disease State / Hypertension Assessment Call   Conditions to be addressed/monitored: HTN  Recent office visits:  08-18-2021 Jon Medin, MD - Patient presented via tele health for Constipation and other concerns. Prescribed Baclofen 5 mg.  Recent consult visits:  08-17-2021 [name], [name], DO (Neurology) - Patient presented for Parkinson's disease  and other concerns.   Hospital visits:  Medication Reconciliation was completed by comparing discharge summary, patient's EMR and Pharmacy list, and upon discussion with patient.   Patient presented to [facility] ED on 08-09-2021 due to Dizziness,and nausea. Patient was there for 6 hours.  New?Medications Started at [facility] Discharge:?? -started  Meclizine HCL 12.5-25 mg PRN Zofran 4 mg PRN  Medication Changes at Hospital Discharge: -Changed  None   Medications Discontinued at Hospital Discharge: -Stopped  None  Medications that remain the same after Hospital Discharge:??  -All other medications will remain the same.    Medications: Outpatient Encounter Medications as of 08/25/2021  Medication Sig   acetaminophen (TYLENOL) 500 MG tablet Take 500 mg by mouth in the morning and at bedtime.   Ascorbic Acid (VITAMIN C PO) Take by mouth.   aspirin 81 MG tablet Take 81 mg by mouth daily.   Baclofen 5 MG TABS Take 5 mg by mouth 3 (three) times daily. As needed for neck spasm caution for sedation   Carbidopa-Levodopa ER (SINEMET CR) 25-100 MG tablet controlled release Take 1 tablet by mouth 3 (three) times daily.   Cholecalciferol (VITAMIN D3) 125 MCG (5000 UT) CAPS Take 1 capsule (5,000 Units total) by mouth daily.   clobetasol (OLUX) 0.05 % topical foam APPLY to back and leg   Coenzyme Q10 100 MG TABS Take 1 tablet by mouth daily.   Cyanocobalamin (B-12) 100 MCG TABS Take 1 tablet by  mouth. Once every three days   DHA-EPA-Vitamin E (OMEGA-3 COMPLEX PO) Take 1,200 mg by mouth 3 (three) times daily. ProOmega LDL   magnesium oxide (MAG-OX) 400 MG tablet Take 400 mg daily by mouth.   meclizine (ANTIVERT) 25 MG tablet Take 0.5-1 tablets (12.5-25 mg total) by mouth 3 (three) times daily as needed for dizziness.   metoprolol tartrate (LOPRESSOR) 50 MG tablet TAKE 1/2 TABLET BY MOUTH IN THE MORNING AND 1 TABLET BY MOUTH IN THE EVENING   metroNIDAZOLE (METROCREAM) 0.75 % cream APPLY TO FACE   MULTIPLE VITAMIN PO Take 1 tablet by mouth daily.   MYRBETRIQ 25 MG TB24 tablet Take 25 mg by mouth daily.   nitroGLYCERIN (NITROSTAT) 0.4 MG SL tablet PLACE 1 TABLET UNDER TONGUE EVERY 5 MINUTES AS NEEDED FOR CHEST PAIN   ondansetron (ZOFRAN) 4 MG tablet Take 1 tablet (4 mg total) by mouth every 8 (eight) hours as needed for nausea or vomiting.   RABEprazole (ACIPHEX) 20 MG tablet TAKE 1 TABLET BY MOUTH EVERY DAY   Red Yeast Rice 600 MG CAPS Take 1 capsule by mouth daily.   tamsulosin (FLOMAX) 0.4 MG CAPS capsule TAKE 1 CAPSULE BY MOUTH EVERY DAY   Facility-Administered Encounter Medications as of 08/25/2021  Medication   0.9 %  sodium chloride infusion  Reviewed chart prior to disease state call. Spoke with patient regarding BP  Recent Office Vitals: BP Readings from Last 3 Encounters:  08/10/21 (!) 171/91  06/07/21 140/86  04/05/21 124/70   Pulse Readings from Last 3 Encounters:  08/10/21 88  06/07/21 74  04/05/21 76    Wt Readings from Last 3 Encounters:  08/18/21 202 lb (91.6 kg)  08/17/21 202 lb (91.6 kg)  08/09/21 202 lb 13.2 oz (92 kg)     Kidney Function Lab Results  Component Value Date/Time   CREATININE 0.72 08/09/2021 08:00 PM   CREATININE 0.76 03/28/2021 02:30 PM   GFR 84.34 03/28/2021 02:30 PM   GFRNONAA >60 08/09/2021 08:00 PM   GFRAA 101 07/14/2020 02:07 PM    BMP Latest Ref Rng & Units 08/09/2021 03/28/2021 07/14/2020  Glucose 70 - 99 mg/dL 118(H) 130(H)  163(H)  BUN 8 - 23 mg/dL '13 17 15  '$ Creatinine 0.61 - 1.24 mg/dL 0.72 0.76 0.73(L)  BUN/Creat Ratio 10 - 24 - - 21  Sodium 135 - 145 mmol/L 136 141 141  Potassium 3.5 - 5.1 mmol/L 3.9 4.1 4.2  Chloride 98 - 111 mmol/L 102 106 104  CO2 22 - 32 mmol/L '24 25 22  '$ Calcium 8.9 - 10.3 mg/dL 9.1 9.8 9.5   Spoke to patients wife for the call;  Current antihypertensive regimen:  Metoprolol tartrate 50 mg 1/2 tablet in the morning and 1 tablet in the evening How often are you checking your Blood Pressure? Patients wife reports they are checking his pressure 2-3 times a week at home. Current home BP readings: She reports most recent reading was 128/78 What recent interventions/DTPs have been made by any provider to improve Blood Pressure control since last CPP Visit: She reports his Carbidopa-Levopa was changed to extended released and thinks that has been helping him a great deal since the change. Any recent hospitalizations or ED visits since last visit with CPP? None What diet changes have been made to improve Blood Pressure Control?  She reports he is eating well but does have some difficulty swallowing. They do voice lessons to help that he does do when he remembers and they are working on the swallowing. What exercise is being done to improve your Blood Pressure Control?  She reports he is still getting around ok does some yard work recently [employer].  Adherence Review: Is the patient currently on ACE/ARB medication? No Does the patient have >5 day gap between last estimated fill dates? No  Notes: Inquired if patient has been doing better with his neck pain and she reports, he is healing and had strained it, was feeling better but then he overdid it in the yard and that's what caused the issue, she reports that he is on the mend, she reports no issues with medications or any other concerns at this time.   Care Gaps: COVID Booster #4(Pfizer) - Overdue Flu Vaccine - Overdue CCM F/U -  12-27-2021 AWV last in 2021 - MSG sent to [place] to schedule.  Star Rating Drugs: None  Ned Clines [place] Clinical Pharmacist Assistant [phone]

## 2021-08-31 ENCOUNTER — Ambulatory Visit: Payer: Medicare Other | Admitting: Internal Medicine

## 2021-09-27 ENCOUNTER — Other Ambulatory Visit: Payer: Self-pay

## 2021-09-27 ENCOUNTER — Telehealth: Payer: Self-pay | Admitting: Neurology

## 2021-09-27 DIAGNOSIS — G20 Parkinson's disease: Secondary | ICD-10-CM

## 2021-09-27 NOTE — Telephone Encounter (Signed)
Patient's wife called in wanting to see if Dr. Carles Collet could refer the patient to [facility] for his back pain. She states she thinks his Parkinson's is causing his arthritis in his back to act up. She states he is in pain and [facility] had helped them in the past. So far he has only taken tylenol extra stregnth twice a day.

## 2021-09-27 NOTE — Telephone Encounter (Signed)
Called and spoke to patients daughter she would like the PT sent to [facility] in [place]. It has been sent and I told them to call me with any other questions or issues

## 2021-09-28 ENCOUNTER — Telehealth: Payer: Self-pay | Admitting: Neurology

## 2021-09-28 NOTE — Telephone Encounter (Signed)
Pt's wife called in stating the patient was scheduled for PT on 10/19/21 and she thanks everyone for their help.

## 2021-09-30 ENCOUNTER — Other Ambulatory Visit: Payer: Self-pay | Admitting: Neurology

## 2021-10-19 ENCOUNTER — Ambulatory Visit: Payer: Medicare Other | Admitting: Physical Therapy

## 2021-10-20 ENCOUNTER — Other Ambulatory Visit: Payer: Self-pay | Admitting: Neurology

## 2021-10-20 ENCOUNTER — Other Ambulatory Visit: Payer: Self-pay | Admitting: Internal Medicine

## 2021-10-25 ENCOUNTER — Ambulatory Visit: Payer: Medicare Other | Admitting: Physical Therapy

## 2021-10-25 ENCOUNTER — Telehealth: Payer: Self-pay | Admitting: Pharmacist

## 2021-10-25 NOTE — Progress Notes (Signed)
Chronic Care Management Pharmacy Assistant   Name: [name]  MRN: [id number] DOB: 1941/02/10   Reason for Encounter: Disease State   Conditions to be addressed/monitored: HLD & HTN  Recent office visits:  None  Recent consult visits:  None  Hospital visits:  Medication Reconciliation was completed by comparing discharge summary, patient's EMR and Pharmacy list, and upon discussion with patient.   Patient presented to [facility] ED on 08/09/2021 due to Dizziness,and nausea. Patient was there for 6 hours.   New?Medications Started at [facility] Discharge:?? -started  Meclizine HCL 12.5-25 mg PRN Zofran 4 mg PRN   Medication Changes at Hospital Discharge: -Changed  None    Medications Discontinued at Hospital Discharge: -Stopped  None   Medications that remain the same after Hospital Discharge:??  -All other medications will remain the same.      Medications: Outpatient Encounter Medications as of 10/25/2021  Medication Sig   acetaminophen (TYLENOL) 500 MG tablet Take 500 mg by mouth in the morning and at bedtime.   Ascorbic Acid (VITAMIN C PO) Take by mouth.   aspirin 81 MG tablet Take 81 mg by mouth daily.   Baclofen 5 MG TABS Take 5 mg by mouth 3 (three) times daily. As needed for neck spasm caution for sedation   Carbidopa-Levodopa ER (SINEMET CR) 25-100 MG tablet controlled release Take 1 tablet by mouth 3 (three) times daily.   Cholecalciferol (VITAMIN D3) 125 MCG (5000 UT) CAPS Take 1 capsule (5,000 Units total) by mouth daily.   clobetasol (OLUX) 0.05 % topical foam APPLY to back and leg   Coenzyme Q10 100 MG TABS Take 1 tablet by mouth daily.   Cyanocobalamin (B-12) 100 MCG TABS Take 1 tablet by mouth. Once every three days   DHA-EPA-Vitamin E (OMEGA-3 COMPLEX PO) Take 1,200 mg by mouth 3 (three) times daily. ProOmega LDL   magnesium oxide (MAG-OX) 400 MG tablet Take 400 mg daily by mouth.   meclizine (ANTIVERT) 25 MG tablet Take  0.5-1 tablets (12.5-25 mg total) by mouth 3 (three) times daily as needed for dizziness.   metoprolol tartrate (LOPRESSOR) 50 MG tablet TAKE 1/2 TABLET BY MOUTH IN THE MORNING AND 1 TABLET BY MOUTH IN THE EVENING   metroNIDAZOLE (METROCREAM) 0.75 % cream APPLY TO FACE   MULTIPLE VITAMIN PO Take 1 tablet by mouth daily.   MYRBETRIQ 25 MG TB24 tablet Take 25 mg by mouth daily.   nitroGLYCERIN (NITROSTAT) 0.4 MG SL tablet PLACE 1 TABLET UNDER TONGUE EVERY 5 MINUTES AS NEEDED FOR CHEST PAIN   ondansetron (ZOFRAN) 4 MG tablet Take 1 tablet (4 mg total) by mouth every 8 (eight) hours as needed for nausea or vomiting.   RABEprazole (ACIPHEX) 20 MG tablet TAKE 1 TABLET BY MOUTH EVERY DAY   Red Yeast Rice 600 MG CAPS Take 1 capsule by mouth daily.   tamsulosin (FLOMAX) 0.4 MG CAPS capsule TAKE 1 CAPSULE BY MOUTH EVERY DAY   Facility-Administered Encounter Medications as of 10/25/2021  Medication   0.9 %  sodium chloride infusion  10/25/2021 Name: [name] MRN: [id number] DOB: 1941-04-18 [name] is a 80 y.o. year old male who is a primary care patient of Panosh, Standley Brooking, MD.  Comprehensive medication review performed; Spoke to patient regarding cholesterol  Lipid Panel    Component Value Date/Time   CHOL 180 10/13/2019 1523   TRIG 126 10/13/2019 1523   HDL 38 (L) 10/13/2019 1523   LDLCALC 119 (  H) 10/13/2019 1523   LDLDIRECT 187.2 03/31/2008 0925    10-year ASCVD risk score: The ASCVD Risk score (Arnett DK, et al., 2019) failed to calculate for the following reasons:   The 2019 ASCVD risk score is only valid for ages 12 to 36  Spoke to patients wife for call  Current antihyperlipidemic regimen:  Omega 3 complex 1,200 mg one gummy daily Red yeast rice 1 capsule daily Previous antihyperlipidemic medications tried: atorvastatin, pravastatin (myalgias), Zetia ASCVD risk enhancing conditions: age >80 and HTN What recent interventions/DTPs have been made by any provider to improve  Cholesterol control since last CPP Visit: She reports no change Any recent hospitalizations or ED visits since last visit with CPP? Yes  Adherence Review: Does the patient have >5 day gap between last estimated fill dates? No   Reviewed chart prior to disease state call. Spoke with patient regarding BP  Recent Office Vitals: BP Readings from Last 3 Encounters:  08/10/21 (!) 171/91  06/07/21 140/86  04/05/21 124/70   Pulse Readings from Last 3 Encounters:  08/10/21 88  06/07/21 74  04/05/21 76    Wt Readings from Last 3 Encounters:  08/18/21 202 lb (91.6 kg)  08/17/21 202 lb (91.6 kg)  08/09/21 202 lb 13.2 oz (92 kg)     Kidney Function Lab Results  Component Value Date/Time   CREATININE 0.72 08/09/2021 08:00 PM   CREATININE 0.76 03/28/2021 02:30 PM   GFR 84.34 03/28/2021 02:30 PM   GFRNONAA >60 08/09/2021 08:00 PM   GFRAA 101 07/14/2020 02:07 PM    BMP Latest Ref Rng & Units 08/09/2021 03/28/2021 07/14/2020  Glucose 70 - 99 mg/dL 118(H) 130(H) 163(H)  BUN 8 - 23 mg/dL 13 17 15   Creatinine 0.61 - 1.24 mg/dL 0.72 0.76 0.73(L)  BUN/Creat Ratio 10 - 24 - - 21  Sodium 135 - 145 mmol/L 136 141 141  Potassium 3.5 - 5.1 mmol/L 3.9 4.1 4.2  Chloride 98 - 111 mmol/L 102 106 104  CO2 22 - 32 mmol/L 24 25 22   Calcium 8.9 - 10.3 mg/dL 9.1 9.8 9.5    Current antihypertensive regimen:  Metoprolol tartrate 50 mg 1/2 tablet in the morning and 1 tablet in the evening How often are you checking your Blood Pressure? weekly Current home BP readings: patients wife reports reading of 127/81 this week. She states he has not complained of any headaches, did have some dizziness but has been several months since it has happened. What recent interventions/DTPs have been made by any provider to improve Blood Pressure control since last CPP Visit: Wife reports none Any recent hospitalizations or ED visits since last visit with CPP? Yes What diet changes have been made to improve Blood Pressure  Control?  Wife reports his appetite is good, he has a lot of phlegm when speaking , swallowing does voice exercises at night when he remembers but is eating pretty well. She reports he has been having some nausea that lingers during the day at times she states he has been prescribed meclizine and if he has to use it will rest for the remainder of the day not go out or drive. What exercise is being done to improve your Blood Pressure Control?  Wife reports he has some tremors in hands/stomach with the parkinson's but has been able to do well with his legs and walking and stopping and starting is getting around well during the day still.  Adherence Review: Is the patient currently on ACE/ARB medication? No Does  the patient have >5 day gap between last estimated fill dates? No     Care Gaps: COVID Booster #4(Pfizer) - Overdue Flu Vaccine - Overdue CCM F/U - 12/2021 AWV - Office aware to schedule BP- 169/88 (ED) 128/78 (home/ sept) Lab Results  Component Value Date   HGBA1C 6.0 03/28/2021   Star Rating Drugs: None    Ned Clines [place] Clinical Pharmacist Assistant [phone]

## 2021-10-28 ENCOUNTER — Encounter: Payer: Medicare Other | Admitting: Physical Therapy

## 2021-11-01 ENCOUNTER — Encounter: Payer: Medicare Other | Admitting: Physical Therapy

## 2021-11-03 ENCOUNTER — Encounter: Payer: Medicare Other | Admitting: Physical Therapy

## 2021-11-08 ENCOUNTER — Encounter: Payer: Medicare Other | Admitting: Physical Therapy

## 2021-11-10 ENCOUNTER — Encounter: Payer: Medicare Other | Admitting: Physical Therapy

## 2021-11-13 ENCOUNTER — Ambulatory Visit: Payer: Self-pay | Admitting: Pharmacist

## 2021-11-13 DIAGNOSIS — I10 Essential (primary) hypertension: Secondary | ICD-10-CM

## 2021-11-13 DIAGNOSIS — I700 Atherosclerosis of aorta: Secondary | ICD-10-CM

## 2021-11-13 NOTE — Progress Notes (Signed)
Updated blood pressure in chart with patient-reported home BP:  BP Readings from Last 3 Encounters:  10/25/21 127/81  08/10/21 (!) 171/91  06/07/21 140/86    Care Plan : [place]  Updates made by Viona Gilmore, [place] since 11/13/2021 12:00 AM     Problem: Problem: Hypertension, Hyperlipidemia, Atrial Fibrillation, GERD, Overactive Bladder, BPH, and Parkinson's disease      Long-Range Goal: Patient-Specific Goal   Start Date: 06/24/2021  Expected End Date: 06/24/2022  Recent Progress: On track  Priority: High  Note:   Current Barriers:  Unable to independently monitor therapeutic efficacy Unable to achieve control of cholesterol   Pharmacist Clinical Goal(s):  Patient will achieve adherence to monitoring guidelines and medication adherence to achieve therapeutic efficacy achieve control of cholesterol as evidenced by next lipid panel  through collaboration with PharmD and provider.   Interventions: 1:1 collaboration with Panosh, Standley Brooking, MD regarding development and update of comprehensive plan of care as evidenced by provider attestation and co-signature Inter-disciplinary care team collaboration (see longitudinal plan of care) Comprehensive medication review performed; medication list updated in electronic medical record  Hypertension (BP goal <140/90) -Controlled -Current treatment: Metoprolol tartrate 50 mg 1/2 tablet in the morning and 1 tablet in the evening -Medications previously tried: none  -Current home readings: 127/81 -Current dietary habits: patient avoids adding extra salt but a lot of his fast food meals and TV dinners have sodium -Current exercise habits: limited -Denies hypotensive/hypertensive symptoms -Educated on Importance of home blood pressure monitoring; Proper BP monitoring technique; -Counseled to monitor BP at home weekly, document, and provide log at future appointments -Counseled on diet and exercise extensively Recommended to  continue current medication  Hyperlipidemia/CAD: (LDL goal < 70) -Uncontrolled -Current treatment: Omega 3 complex 1,200 mg one gummy daily Red yeast rice 1 capsule daily -Medications previously tried: atorvastatin, pravastatin (myalgias), Zetia -Current dietary patterns: did not discuss -Current exercise habits: does not routinely exercise -Educated on Cholesterol goals;  Importance of limiting foods high in cholesterol; -Counseled on lack of benefit from fish oil with current dose and can consider stopping depending on results of lipid panel  CAD (Goal: prevent heart events) -Uncontrolled (no statin) -Current treatment  Aspirin 81 mg 1 tablet daily Nitroglycerin 0.4 mg SL tablet as needed -Medications previously tried: none  -Recommended to continue current medication   Atrial Fibrillation (Goal: prevent stroke and major bleeding) -Controlled -CHADSVASC: 3 -Current treatment: Rate control: Metoprolol tartrate 50 mg 1/2 tablet in the morning and 1 tablet in the evening Anticoagulation: aspirin 81 mg 1 tablet daily -Medications previously tried: none -Home BP and HR readings: does not check routinely  -Counseled on avoidance of NSAIDs due to increased bleeding risk with anticoagulants; -Recommended to continue current medication  GERD (Goal: minimize symptoms) -Controlled -Current treatment  Rabeprazole 20 mg 1 tablet daily -Medications previously tried: none  -Counseled on non-pharmacologic management of symptoms such as elevating the head of your bed, avoiding eating 2-3 hours before bed, avoiding triggering foods such as acidic, spicy, or fatty foods, eating smaller meals, and wearing clothes that are loose around the waist  BPH (Goal: minimize symptoms of enlarged prostate) -Not ideally controlled -Current treatment  Tamsulosin 0.4 mg 1 capsule daily - in AM -Medications previously tried: none  -Recommended discussion with urologist.  Overactive bladder (Goal:  minimize symptoms) -Not ideally controlled -Current treatment  Myrbetriq 25 mg 1 tablet daily -Medications previously tried: none  -Recommended discussion with urologist.  Parkinson's disease (Goal: minimize  symptoms) -Controlled -Current treatment  Carbidopa-levodopa 25-100 mg 1 tablet three times daily -Medications previously tried: none  -Recommended to continue current medication Counseled on proper administration times and side effects with medication.   Health Maintenance -Vaccine gaps: COVID booster -Current therapy:  Metronidazole cream 0.75% as needed Tylenol 500 mg 1 tablet in AM and 1 tablet in PM Ibuprofen as needed Vitamin D3 5000 units 1 capsule daily Coenzyme Q10 gummy 100 mg 1 tablet daily Magnesium 400 mg gummy 1 tablet daily Multivitamin 1 tablet daily -Educated on Cost vs benefit of each product must be carefully weighed by individual consumer -Patient is satisfied with current therapy and denies issues -Recommended decreasing vitamin C supplementation to 250 mg daily and decreasing vitamin D intake to avoid oversupplementation.  Patient Goals/Self-Care Activities Patient will:  - take medications as prescribed check blood pressure weekly, document, and provide at future appointments target a minimum of 150 minutes of moderate intensity exercise weekly  Follow Up Plan: Telephone follow up appointment with care management team member scheduled for: 6 months      Viona Gilmore, [facility]

## 2021-11-15 ENCOUNTER — Encounter: Payer: Medicare Other | Admitting: Physical Therapy

## 2021-11-22 ENCOUNTER — Encounter: Payer: Medicare Other | Admitting: Physical Therapy

## 2021-11-24 ENCOUNTER — Encounter: Payer: Medicare Other | Admitting: Physical Therapy

## 2021-12-07 ENCOUNTER — Ambulatory Visit (INDEPENDENT_AMBULATORY_CARE_PROVIDER_SITE_OTHER): Payer: Medicare Other

## 2021-12-07 VITALS — Ht 67.0 in | Wt 202.0 lb

## 2021-12-07 DIAGNOSIS — Z0000 Encounter for general adult medical examination without abnormal findings: Secondary | ICD-10-CM

## 2021-12-07 NOTE — Patient Instructions (Addendum)
[name] , Thank you for taking time to come for your Medicare Wellness Visit. I appreciate your ongoing commitment to your health goals. Please review the following plan we discussed and let me know if I can assist you in the future.   These are the goals we discussed:  Goals      Exercise 3x per week (30 min per time)        This is a list of the screening recommended for you and due dates:  Health Maintenance  Topic Date Due   COVID-19 Vaccine (5 - Booster for Pfizer series) 10/18/2021   Tetanus Vaccine  08/17/2025   Pneumonia Vaccine  Completed   Flu Shot  Completed   Zoster (Shingles) Vaccine  Completed   HPV Vaccine  Aged Out   Advanced directives: Yes  Conditions/risks identified: None  Next appointment: Follow up in one year for your annual wellness visit.   Preventive Care 74 Years and Older, Male Preventive care refers to lifestyle choices and visits with your health care provider that can promote health and wellness. What does preventive care include? A yearly physical exam. This is also called an annual well check. Dental exams once or twice a year. Routine eye exams. Ask your health care provider how often you should have your eyes checked. Personal lifestyle choices, including: Daily care of your teeth and gums. Regular physical activity. Eating a healthy diet. Avoiding tobacco and drug use. Limiting alcohol use. Practicing safe sex. Taking low doses of aspirin every day. Taking vitamin and mineral supplements as recommended by your health care provider. What happens during an annual well check? The services and screenings done by your health care provider during your annual well check will depend on your age, overall health, lifestyle risk factors, and family history of disease. Counseling  Your health care provider may ask you questions about your: Alcohol use. Tobacco use. Drug use. Emotional well-being. Home and relationship well-being. Sexual  activity. Eating habits. History of falls. Memory and ability to understand (cognition). Work and work Statistician. Screening  You may have the following tests or measurements: Height, weight, and BMI. Blood pressure. Lipid and cholesterol levels. These may be checked every 5 years, or more frequently if you are over 48 years old. Skin check. Lung cancer screening. You may have this screening every year starting at age 39 if you have a 30-pack-year history of smoking and currently smoke or have quit within the past 15 years. Fecal occult blood test (FOBT) of the stool. You may have this test every year starting at age 68. Flexible sigmoidoscopy or colonoscopy. You may have a sigmoidoscopy every 5 years or a colonoscopy every 10 years starting at age 42. Prostate cancer screening. Recommendations will vary depending on your family history and other risks. Hepatitis C blood test. Hepatitis B blood test. Sexually transmitted disease (STD) testing. Diabetes screening. This is done by checking your blood sugar (glucose) after you have not eaten for a while (fasting). You may have this done every 1-3 years. Abdominal aortic aneurysm (AAA) screening. You may need this if you are a current or former smoker. Osteoporosis. You may be screened starting at age 65 if you are at high risk. Talk with your health care provider about your test results, treatment options, and if necessary, the need for more tests. Vaccines  Your health care provider may recommend certain vaccines, such as: Influenza vaccine. This is recommended every year. Tetanus, diphtheria, and acellular pertussis (Tdap, Td) vaccine.  You may need a Td booster every 10 years. Zoster vaccine. You may need this after age 16. Pneumococcal 13-valent conjugate (PCV13) vaccine. One dose is recommended after age 82. Pneumococcal polysaccharide (PPSV23) vaccine. One dose is recommended after age 27. Talk to your health care provider about which  screenings and vaccines you need and how often you need them. This information is not intended to replace advice given to you by your health care provider. Make sure you discuss any questions you have with your health care provider. Document Released: 01/07/2016 Document Revised: 08/30/2016 Document Reviewed: 10/12/2015 Elsevier Interactive Patient Education  2017 [place] Prevention in the Home Falls can cause injuries. They can happen to people of all ages. There are many things you can do to make your home safe and to help prevent falls. What can I do on the outside of my home? Regularly fix the edges of walkways and driveways and fix any cracks. Remove anything that might make you trip as you walk through a door, such as a raised step or threshold. Trim any bushes or trees on the path to your home. Use bright outdoor lighting. Clear any walking paths of anything that might make someone trip, such as rocks or tools. Regularly check to see if handrails are loose or broken. Make sure that both sides of any steps have handrails. Any raised decks and porches should have guardrails on the edges. Have any leaves, snow, or ice cleared regularly. Use sand or salt on walking paths during winter. Clean up any spills in your garage right away. This includes oil or grease spills. What can I do in the bathroom? Use night lights. Install grab bars by the toilet and in the tub and shower. Do not use towel bars as grab bars. Use non-skid mats or decals in the tub or shower. If you need to sit down in the shower, use a plastic, non-slip stool. Keep the floor dry. Clean up any water that spills on the floor as soon as it happens. Remove soap buildup in the tub or shower regularly. Attach bath mats securely with double-sided non-slip rug tape. Do not have throw rugs and other things on the floor that can make you trip. What can I do in the bedroom? Use night lights. Make sure that you have a  light by your bed that is easy to reach. Do not use any sheets or blankets that are too big for your bed. They should not hang down onto the floor. Have a firm chair that has side arms. You can use this for support while you get dressed. Do not have throw rugs and other things on the floor that can make you trip. What can I do in the kitchen? Clean up any spills right away. Avoid walking on wet floors. Keep items that you use a lot in easy-to-reach places. If you need to reach something above you, use a strong step stool that has a grab bar. Keep electrical cords out of the way. Do not use floor polish or wax that makes floors slippery. If you must use wax, use non-skid floor wax. Do not have throw rugs and other things on the floor that can make you trip. What can I do with my stairs? Do not leave any items on the stairs. Make sure that there are handrails on both sides of the stairs and use them. Fix handrails that are broken or loose. Make sure that handrails are as long as  the stairways. Check any carpeting to make sure that it is firmly attached to the stairs. Fix any carpet that is loose or worn. Avoid having throw rugs at the top or bottom of the stairs. If you do have throw rugs, attach them to the floor with carpet tape. Make sure that you have a light switch at the top of the stairs and the bottom of the stairs. If you do not have them, ask someone to add them for you. What else can I do to help prevent falls? Wear shoes that: Do not have high heels. Have rubber bottoms. Are comfortable and fit you well. Are closed at the toe. Do not wear sandals. If you use a stepladder: Make sure that it is fully opened. Do not climb a closed stepladder. Make sure that both sides of the stepladder are locked into place. Ask someone to hold it for you, if possible. Clearly mark and make sure that you can see: Any grab bars or handrails. First and last steps. Where the edge of each step  is. Use tools that help you move around (mobility aids) if they are needed. These include: Canes. Walkers. Scooters. Crutches. Turn on the lights when you go into a dark area. Replace any light bulbs as soon as they burn out. Set up your furniture so you have a clear path. Avoid moving your furniture around. If any of your floors are uneven, fix them. If there are any pets around you, be aware of where they are. Review your medicines with your doctor. Some medicines can make you feel dizzy. This can increase your chance of falling. Ask your doctor what other things that you can do to help prevent falls. This information is not intended to replace advice given to you by your health care provider. Make sure you discuss any questions you have with your health care provider. Document Released: 10/07/2009 Document Revised: 05/18/2016 Document Reviewed: 01/15/2015 Elsevier Interactive Patient Education  2017 [employer].

## 2021-12-07 NOTE — Progress Notes (Signed)
Subjective:   [name] is a 80 y.o. male who presents for Medicare Annual/Subsequent preventive examination.  Review of Systems    No ROS Cardiac Risk Factors include: advanced age (>65men, >56 women);hypertension    Objective:    Today's Vitals   12/07/21 0950  Weight: 202 lb (91.6 kg)  Height: 5\' 7"  (1.702 m)   Body mass index is 31.64 kg/m.  Advanced Directives 12/07/2021 08/17/2021 04/05/2021 12/06/2020 09/11/2020 10/23/2019 04/17/2017  Does Patient Have a Medical Advance Directive? Yes No No No No No No  Type of Advance Directive Living will - - - - - -  Does patient want to make changes to medical advance directive? No - Patient declined - - - - - -  Would patient like information on creating a medical advance directive? No - Patient declined - - No - Patient declined - No - Patient declined -    Current Medications (verified) Outpatient Encounter Medications as of 12/07/2021  Medication Sig   acetaminophen (TYLENOL) 500 MG tablet Take 500 mg by mouth in the morning and at bedtime.   Ascorbic Acid (VITAMIN C PO) Take by mouth.   aspirin 81 MG tablet Take 81 mg by mouth daily.   Baclofen 5 MG TABS Take 5 mg by mouth 3 (three) times daily. As needed for neck spasm caution for sedation   Carbidopa-Levodopa ER (SINEMET CR) 25-100 MG tablet controlled release Take 1 tablet by mouth 3 (three) times daily.   Cholecalciferol (VITAMIN D3) 125 MCG (5000 UT) CAPS Take 1 capsule (5,000 Units total) by mouth daily.   clobetasol (OLUX) 0.05 % topical foam APPLY to back and leg   Coenzyme Q10 100 MG TABS Take 1 tablet by mouth daily.   Cyanocobalamin (B-12) 100 MCG TABS Take 1 tablet by mouth. Once every three days   DHA-EPA-Vitamin E (OMEGA-3 COMPLEX PO) Take 1,200 mg by mouth 3 (three) times daily. ProOmega LDL   magnesium oxide (MAG-OX) 400 MG tablet Take 400 mg daily by mouth.   meclizine (ANTIVERT) 25 MG tablet Take 0.5-1 tablets (12.5-25 mg total) by mouth 3 (three) times  daily as needed for dizziness.   metoprolol tartrate (LOPRESSOR) 50 MG tablet TAKE 1/2 TABLET BY MOUTH IN THE MORNING AND 1 TABLET BY MOUTH IN THE EVENING   metroNIDAZOLE (METROCREAM) 0.75 % cream APPLY TO FACE   MULTIPLE VITAMIN PO Take 1 tablet by mouth daily.   MYRBETRIQ 25 MG TB24 tablet Take 25 mg by mouth daily.   nitroGLYCERIN (NITROSTAT) 0.4 MG SL tablet PLACE 1 TABLET UNDER TONGUE EVERY 5 MINUTES AS NEEDED FOR CHEST PAIN   ondansetron (ZOFRAN) 4 MG tablet Take 1 tablet (4 mg total) by mouth every 8 (eight) hours as needed for nausea or vomiting.   RABEprazole (ACIPHEX) 20 MG tablet TAKE 1 TABLET BY MOUTH EVERY DAY   Red Yeast Rice 600 MG CAPS Take 1 capsule by mouth daily.   tamsulosin (FLOMAX) 0.4 MG CAPS capsule TAKE 1 CAPSULE BY MOUTH EVERY DAY   Facility-Administered Encounter Medications as of 12/07/2021  Medication   0.9 %  sodium chloride infusion    Allergies (verified) Propoxyphene n-acetaminophen and Iodinated diagnostic agents   History: Past Medical History:  Diagnosis Date   Abdominal pain, right lateral 02/21/2012   progressive.     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   Cataract    COLONIC POLYPS, HX OF 10/18/2007   Cough 10/25/2010   DIZZINESS 01/22/2009   DYSPNEA ON EXERTION 08/21/2008   ECHOCARDIOGRAM, ABNORMAL 08/19/2007   GERD 07/25/2007   Heart murmur    HERNIA 07/06/2009   HX, PERSONAL, MALIGNANCY, SKIN NEC 10/18/2007    HYPERGLYCEMIA, BORDERLINE 12/31/2007   HYPERLIPIDEMIA 07/25/2007   HYPERTENSION 07/23/2007   Inguinal hernia    LUMBAR RADICULOPATHY, RIGHT 10/18/2007   MUSCLE PAIN 01/22/2009   Nausea    NEURITIS 10/25/2010   Palpitations 05/09/2010   RHINITIS, CHRONIC 05/11/2010   RUQ PAIN 01/22/2009   Sinus polyp    UNS ADVRS EFF OTH RX MEDICINAL&BIOLOGICAL SBSTNC 04/15/2008   VERRUCA VULGARIS 12/31/2007   VITAMIN D DEFICIENCY 11/02/2009   Voice strain    Past Surgical History:  Procedure Laterality Date   BACK SURGERY     basal cell and melanoma removed     face   BONY PELVIS SURGERY     CATARACT EXTRACTION     BIL   COLONOSCOPY     CORONARY ARTERY BYPASS GRAFT  2009   HERNIA REPAIR  7 times   inguinal   LAPAROSCOPIC CHOLECYSTECTOMY  03/03/2013   LUMBAR SPINE SURGERY     POLYPECTOMY     TONSILLECTOMY     Family History  Problem Relation Age of Onset   Lung cancer Mother    Cancer Mother    Heart attack Father    Hypertension Father    Alcohol abuse Father    Heart disease Father    Breast cancer Sister    Cancer Sister        breast   Alcohol abuse Brother    Hypertension Brother    Alcohol abuse Brother    Hypertension Brother    Alcohol abuse Brother    Hypertension Brother    Kidney disease Sister    Colon cancer Neg Hx    Esophageal cancer Neg Hx    Rectal cancer Neg Hx    Stomach cancer Neg Hx    Social History   Socioeconomic History   Marital status: Married    Spouse name: Not on file   Number of children: Not on file   Years of education: Not on file   Highest education level: Not on file  Occupational History   Not on file  Tobacco Use   Smoking status: Former    Types: Cigarettes    Quit date: 04/02/1964    Years since quitting: 57.7   Smokeless tobacco: Never   Tobacco comments:    in early 20's he quit  Vaping Use   Vaping Use: Never used  Substance and Sexual Activity   Alcohol use: Yes    Comment: socially   Drug use: No   Sexual activity: Not on  file  Other Topics Concern   Not on file  Social History Narrative   Retired [employer]   Married   Non smoker some etoh   Travels a lot  Social Determinants of Health   Financial Resource Strain: Low Risk    Difficulty of Paying Living Expenses: Not hard at all  Food Insecurity: No Food Insecurity   Worried About Charity fundraiser in the Last Year: Never true   [place] in the Last Year: Never true  Transportation Needs: No Transportation Needs   Lack of Transportation (Medical): No   Lack of Transportation (Non-Medical): No  Physical Activity: Sufficiently Active   Days of Exercise per Week: 7 days   Minutes of Exercise per Session: 30 min  Stress: No Stress Concern Present   Feeling of Stress : Not at all  Social Connections: Socially Integrated   Frequency of Communication with Friends and Family: More than three times a week   Frequency of Social Gatherings with Friends and Family: More than three times a week   Attends Religious Services: More than 4 times per year   Active Member of [employer] or Organizations: Yes   Attends Music therapist: More than 4 times per year   Marital Status: Married    Clinical Intake:  Diabetic? No  Activities of Daily Living In your present state of health, do you have any difficulty performing the following activities: 12/07/2021  Hearing? N  Vision? N  Difficulty concentrating or making decisions? N  Walking or climbing stairs? N  Comment Patient uses cane  Dressing or bathing? N  Doing errands, shopping? N  Preparing Food and eating ? N  Using the Toilet? N  In the past six months, have you accidently leaked urine? Y  Comment Wears depends and pads  Do you have problems with loss of bowel control? N  Managing your Medications? N  Managing your Finances? N  Housekeeping or managing your Housekeeping? N  Some recent data might be hidden    Patient Care Team: Panosh, Standley Brooking, MD as PCP -  General Josue Hector, MD (Cardiology) Rexene Alberts, MD (Inactive) (Thoracic Diseases) Milus Banister, MD as Attending Physician (Gastroenterology) Luna Kitchens., MD as Referring Physician (Neurosurgery) Viona Gilmore, [facility] as Pharmacist (Pharmacist) Warren Danes, PA-C as Physician Assistant (Dermatology) [name], [name], DO as Consulting Physician (Neurology)  Indicate any recent Medical Services you may have received from other than [facility] providers in the past year (date may be approximate).     Assessment:   This is a routine wellness examination for Tyrin.  Virtual Visit via Telephone Note  I connected with  [name] on 12/07/21 at  9:45 AM EST by telephone and verified that I am speaking with the correct person using two identifiers.  Location: Patient: Home Provider: Office Persons participating in the virtual visit: patient/Nurse Health Advisor   I discussed the limitations, risks, security and privacy concerns of performing an evaluation and management service by telephone and the availability of in person appointments. The patient expressed understanding and agreed to proceed.  Interactive audio and video telecommunications were attempted between this nurse and patient, however failed, due to patient having technical difficulties OR patient did not have access to video capability.  We continued and completed visit with audio only.  Some vital signs may be absent or patient reported.   Criselda Peaches, LPN   Hearing/Vision screen Hearing Screening - Comments:: No difficulty hearing Vision Screening - Comments:: Wears reading glasses. Followed by Dr Monica Martinez  Dietary issues and exercise activities discussed: Current Exercise Habits: Home exercise routine, Type of exercise: stretching,  Time (Minutes): 30, Frequency (Times/Week): 7, Weekly Exercise (Minutes/Week): 210, Intensity: Mild   Goals Addressed             This Visit's Progress     Exercise 3x per week (30 min per time)         Depression Screen PHQ 2/9 Scores 12/07/2021 12/06/2020 04/09/2019 02/20/2017 02/09/2016 07/27/2014  PHQ - 2 Score 0 0 0 0 0 0  PHQ- 9 Score - 0 - - - -    Fall Risk Fall Risk  12/07/2021 08/17/2021 04/05/2021 12/06/2020 04/09/2019  Falls in the past year? 1 0 0 0 0  Number falls in past yr: 0 0 0 0 -  Injury with Fall? 0 0 0 0 -  Comment Patient tripped over cane. No injuries - - - -  Risk for fall due to : - - - Impaired balance/gait;Impaired mobility -  Follow up - - - Falls evaluation completed;Falls prevention discussed -    FALL RISK PREVENTION PERTAINING TO THE HOME:  Any stairs in or around the home? No  If so, are there any without handrails? No  Home free of loose throw rugs in walkways, pet beds, electrical cords, etc? Yes  Adequate lighting in your home to reduce risk of falls? Yes   ASSISTIVE DEVICES UTILIZED TO PREVENT FALLS:  Life alert? No Use of a cane, walker or w/c? Yes  Grab bars in the bathroom? Yes  Shower chair or bench in shower? Yes  Elevated toilet seat or a handicapped toilet? Yes   TIMED UP AND GO:  Was the test performed? No . Audio Visit  Cognitive Function:    Immunizations Immunization History  Administered Date(s) Administered   Fluad Quad(high Dose 65+) 09/23/2019, 09/02/2021   Influenza Split 10/13/2011, 09/02/2012, 12/29/2013   Influenza Whole 10/18/2007, 09/21/2009, 09/15/2010   Influenza, High Dose Seasonal PF 09/21/2014, 08/18/2015, 10/09/2016, 08/31/2017, 09/10/2018, 08/23/2020   Influenza-Unspecified 08/31/2017   PFIZER Comirnaty(Gray Top)Covid-19 Tri-Sucrose Vaccine 08/23/2021   PFIZER(Purple Top)SARS-COV-2 Vaccination 02/17/2020, 03/09/2020, 11/30/2020, 08/23/2021   Pneumococcal Conjugate-13 07/27/2014   Pneumococcal Polysaccharide-23 02/09/2016   Tdap 08/18/2015   Zoster Recombinat (Shingrix) 10/16/2020, 04/20/2021   Zoster, Live 03/12/2014    Flu Vaccine status: Up to  date  Pneumococcal vaccine status: Up to date  Covid-19 vaccine status: Completed vaccines  Qualifies for Shingles Vaccine? Yes   Zostavax completed Yes   Shingrix Completed?: Yes  Screening Tests Health Maintenance  Topic Date Due   COVID-19 Vaccine (5 - Booster for Pfizer series) 10/18/2021   TETANUS/TDAP  08/17/2025   Pneumonia Vaccine 28+ Years old  Completed   INFLUENZA VACCINE  Completed   Zoster Vaccines- Shingrix  Completed   HPV VACCINES  Aged Out    Health Maintenance  Health Maintenance Due  Topic Date Due   COVID-19 Vaccine (5 - Booster for [place] series) 10/18/2021    Vision Screening: Recommended annual ophthalmology exams for early detection of glaucoma and other disorders of the eye. Is the patient up to date with their annual eye exam?  Yes  Who is the provider or what is the name of the office in which the patient attends annual eye exams? Followed by Dr Monica Martinez.   Dental Screening: Recommended annual dental exams for proper oral hygiene  Community Resource Referral / Chronic Care Management:  CRR required this visit?  No   CCM required this visit?  No      Plan:     I have personally reviewed and noted  the following in the patients chart:   Medical and social history Use of alcohol, tobacco or illicit drugs  Current medications and supplements including opioid prescriptions. Patient is not currently taking opioid prescriptions. Functional ability and status Nutritional status Physical activity Advanced directives List of other physicians Hospitalizations, surgeries, and ER visits in previous 12 months Vitals Screenings to include cognitive, depression, and falls Referrals and appointments  In addition, I have reviewed and discussed with patient certain preventive protocols, quality metrics, and best practice recommendations. A written personalized care plan for preventive services as well as general preventive health recommendations were  provided to patient.     Criselda Peaches, LPN   [id number]

## 2021-12-27 ENCOUNTER — Telehealth: Payer: Self-pay | Admitting: Pharmacist

## 2021-12-27 ENCOUNTER — Ambulatory Visit (INDEPENDENT_AMBULATORY_CARE_PROVIDER_SITE_OTHER): Payer: Medicare Other | Admitting: Pharmacist

## 2021-12-27 DIAGNOSIS — G20 Parkinson's disease: Secondary | ICD-10-CM

## 2021-12-27 DIAGNOSIS — I10 Essential (primary) hypertension: Secondary | ICD-10-CM

## 2021-12-27 NOTE — Progress Notes (Signed)
Chronic Care Management Pharmacy Note  12/27/2021 Name:  [name] MRN:  [id number] DOB:  1941-03-20  Summary: LDL not at goal < 70 BP is at goal < 140/90 per home readings  Recommendations/Changes made from today's visit: -Recommended reaching out to our office for Zofran refills in the future -Recommended purchasing a larger pillbox separated into times of day to improve adherence -Recommend cholesterol therapy for LDL lowering but patient declines  Plan: Follow up BP assessment in 3 months   Subjective: [name] is an 81 y.o. year old male who is a primary patient of Panosh, Standley Brooking, MD.  The CCM team was consulted for assistance with disease management and care coordination needs.    Engaged with patient by telephone for follow up visit in response to provider referral for pharmacy case management and/or care coordination services.   Consent to Services:  The patient was given information about Chronic Care Management services, agreed to services, and gave verbal consent prior to initiation of services.  Please see initial visit note for detailed documentation.   Patient Care Team: Panosh, Standley Brooking, MD as PCP - General Josue Hector, MD (Cardiology) Rexene Alberts, MD (Inactive) (Thoracic Diseases) Milus Banister, MD as Attending Physician (Gastroenterology) Luna Kitchens., MD as Referring Physician (Neurosurgery) Viona Gilmore, [facility] as Pharmacist (Pharmacist) Warren Danes, PA-C as Physician Assistant (Dermatology) [name], [name], DO as Consulting Physician (Neurology)  Recent office visits: 12/07/21 Rolene Arbour, LPN: Patient presented for AWV.  08/18/21 Panosh, Standley Brooking, MD - Patient presented via tele health for Constipation and other concerns. Prescribed Baclofen 5 mg.  Recent consult visits: 08/17/21 [name], [name], DO (Neurology) - Patient presented for Parkinson's disease  and other concerns. Switched carbidopa/levodopa to CR  formulation three times daily. Ordered PT.  03/31/21 Robyne Askew, PA-C (dermatology): Patient presented for rash and rosacea. Prescribed metronidazole for rosacea and clobetasol for rash.   Hospital visits: Medication Reconciliation was completed by comparing discharge summary, patients EMR and Pharmacy list, and upon discussion with patient.   Patient [facility] on 08/09/2021 due to Dizziness,and nausea. Patient was there for 6 hours.   New?Medications Started at [facility] Discharge:?? -started  Meclizine HCL 12.5-25 mg PRN Zofran 4 mg PRN   Medication Changes at Hospital Discharge: -Changed  None    Medications Discontinued at Hospital Discharge: -Stopped  None   Medications that remain the same after Hospital Discharge:??  -All other medications will remain the same.      Objective:  Lab Results  Component Value Date   CREATININE 0.72 08/09/2021   BUN 13 08/09/2021   GFR 84.34 03/28/2021   GFRNONAA >60 08/09/2021   GFRAA 101 07/14/2020   NA 136 08/09/2021   K 3.9 08/09/2021   CALCIUM 9.1 08/09/2021   CO2 24 08/09/2021   GLUCOSE 118 (H) 08/09/2021    Lab Results  Component Value Date/Time   HGBA1C 6.0 03/28/2021 02:30 PM   HGBA1C 5.8 02/20/2017 02:40 PM   GFR 84.34 03/28/2021 02:30 PM   GFR 124.29 02/20/2017 02:40 PM    Last diabetic Eye exam: No results found for: HMDIABEYEEXA  Last diabetic Foot exam: No results found for: HMDIABFOOTEX   Lab Results  Component Value Date   CHOL 180 10/13/2019   HDL 38 (L) 10/13/2019   LDLCALC 119 (H) 10/13/2019   LDLDIRECT 187.2 03/31/2008   TRIG 126 10/13/2019   CHOLHDL 4.7 10/13/2019  Hepatic Function Latest Ref Rng & Units 08/09/2021 10/13/2019 02/19/2019  Total Protein 6.5 - 8.1 g/dL 6.3(L) 6.2 6.4  Albumin 3.5 - 5.0 g/dL 3.8 4.4 4.3  AST 15 - 41 U/L 18 13 17   ALT 0 - 44 U/L 7 19 20   Alk Phosphatase 38 - 126 U/L 66 72 62  Total Bilirubin 0.3 - 1.2 mg/dL 0.9 0.6 0.6   Bilirubin, Direct 0.00 - 0.40 mg/dL - 0.13 0.16    Lab Results  Component Value Date/Time   TSH 1.49 03/28/2021 02:30 PM   TSH 1.640 10/13/2019 03:32 PM   FREET4 1.18 03/28/2021 02:30 PM    CBC Latest Ref Rng & Units 08/09/2021 03/28/2021 07/14/2020  WBC 4.0 - 10.5 K/uL 9.6 7.6 7.5  Hemoglobin 13.0 - 17.0 g/dL 15.2 15.6 15.1  Hematocrit 39.0 - 52.0 % 44.7 44.9 43.6  Platelets 150 - 400 K/uL 232 250.0 271    Lab Results  Component Value Date/Time   VD25OH 29.3 (L) 10/13/2019 03:32 PM   VD25OH 43 07/28/2010 09:27 PM    Clinical ASCVD: Yes  The ASCVD Risk score (Arnett DK, et al., 2019) failed to calculate for the following reasons:   The 2019 ASCVD risk score is only valid for ages 77 to 85    Depression screen PHQ 2/9 12/07/2021 12/06/2020 04/09/2019  Decreased Interest 0 0 0  Down, Depressed, Hopeless 0 0 0  PHQ - 2 Score 0 0 0  Altered sleeping - 0 -  Tired, decreased energy - 0 -  Change in appetite - 0 -  Feeling bad or failure about yourself  - 0 -  Trouble concentrating - 0 -  Moving slowly or fidgety/restless - 0 -  Suicidal thoughts - 0 -  PHQ-9 Score - 0 -  Difficult doing work/chores - Not difficult at all -  Some recent data might be hidden    CHA2DS2/VAS Stroke Risk Points  Current as of 23 minutes ago     4 >= 2 Points: High Risk  1 - 1.99 Points: Medium Risk  0 Points: Low Risk    Last Change: N/A      Details    This score determines the patient's risk of having a stroke if the  patient has atrial fibrillation.       Points Metrics  0 Has Congestive Heart Failure:  No    Current as of 23 minutes ago  1 Has Vascular Disease:  Yes    Current as of 23 minutes ago  1 Has Hypertension:  Yes    Current as of 23 minutes ago  2 Age:  36    Current as of 23 minutes ago  0 Has Diabetes:  No    Current as of 23 minutes ago  0 Had Stroke:  No  Had TIA:  No  Had Thromboembolism:  No    Current as of 23 minutes ago  0 Male:  No    Current as of 23  minutes ago     Social History   Tobacco Use  Smoking Status Former   Types: Cigarettes   Quit date: 04/02/1964   Years since quitting: 57.7  Smokeless Tobacco Never  Tobacco Comments   in early 20's he quit   BP Readings from Last 3 Encounters:  10/25/21 127/81  08/10/21 (!) 171/91  06/07/21 140/86   Pulse Readings from Last 3 Encounters:  08/10/21 88  06/07/21 74  04/05/21 76   Wt Readings from Last  3 Encounters:  12/07/21 202 lb (91.6 kg)  08/18/21 202 lb (91.6 kg)  08/17/21 202 lb (91.6 kg)   BMI Readings from Last 3 Encounters:  12/07/21 31.64 kg/m  08/18/21 31.64 kg/m  08/17/21 31.64 kg/m    Assessment/Interventions: Review of patient past medical history, allergies, medications, health status, including review of consultants reports, laboratory and other test data, was performed as part of comprehensive evaluation and provision of chronic care management services.   SDOH:  (Social Determinants of Health) assessments and interventions performed: No  SDOH Screenings   Alcohol Screen: Not on file  Depression (PHQ2-9): Low Risk    PHQ-2 Score: 0  Financial Resource Strain: Low Risk    Difficulty of Paying Living Expenses: Not hard at all  Food Insecurity: No Food Insecurity   Worried About Charity fundraiser in the Last Year: Never true   Ran Out of Food in the Last Year: Never true  Housing: Low Risk    Last Housing Risk Score: 0  Physical Activity: Sufficiently Active   Days of Exercise per Week: 7 days   Minutes of Exercise per Session: 30 min  Social Connections: Engineer, building services of Communication with Friends and Family: More than three times a week   Frequency of Social Gatherings with Friends and Family: More than three times a week   Attends Religious Services: More than 4 times per year   Active Member of [employer] or Organizations: Yes   Attends Music therapist: More than 4 times per year   Marital Status: Married   Stress: No Stress Concern Present   Feeling of Stress : Not at all  Tobacco Use: Medium Risk   Smoking Tobacco Use: Former   Smokeless Tobacco Use: Never   Passive Exposure: Not on Pensions consultant Needs: No Transportation Needs   Lack of Transportation (Medical): No   Lack of Transportation (Non-Medical): No    CCM Care Plan  Allergies  Allergen Reactions   Propoxyphene N-Acetaminophen Nausea And Vomiting   Iodinated Contrast Media Swelling    Pt developed slight lt upper lip swelling only to one side about 15 minutes post IV contrast injection. No other symptoms.     Medications Reviewed Today     Reviewed by Viona Gilmore, [facility] (Pharmacist) on 12/27/21 at 77  Med List Status: <None>   Medication Order Taking? Sig Documenting Provider Last Dose Status Informant  0.9 %  sodium chloride infusion [id number]   Milus Banister, MD  Active   acetaminophen (TYLENOL) 500 MG tablet [id number]  Take 500 mg by mouth in the morning and at bedtime. [provider]  Active   aspirin 81 MG tablet [id number]  Take 81 mg by mouth daily. [provider]  Active   Baclofen 5 MG TABS [id number]  Take 5 mg by mouth 3 (three) times daily. As needed for neck spasm caution for sedation Panosh, Standley Brooking, MD  Active   Carbidopa-Levodopa ER (SINEMET CR) 25-100 MG tablet controlled release [id number] Yes Take 1 tablet by mouth 3 (three) times daily. Ludwig Clarks, DO Taking Active   Cholecalciferol (VITAMIN D3) 125 MCG (5000 UT) CAPS [id number]  Take 1 capsule (5,000 Units total) by mouth daily. Josue Hector, MD  Active   clobetasol (OLUX) 0.05 % topical foam [id number]  APPLY to back and leg Starlyn Skeans  Active   Coenzyme Q10 100 MG TABS [id number]  Take 1 tablet by mouth daily.  [provider]  Active   Cyanocobalamin (B-12) 100 MCG TABS [id number]  Take 1 tablet by mouth. Once every three days [provider]  Active   DHA-EPA-Vitamin E (OMEGA-3  COMPLEX PO) [id number]  Take 1,200 mg by mouth 3 (three) times daily. ProOmega LDL [provider]  Active   magnesium oxide (MAG-OX) 400 MG tablet [id number]  Take 400 mg daily by mouth. [provider]  Active   meclizine (ANTIVERT) 25 MG tablet [id number]  Take 0.5-1 tablets (12.5-25 mg total) by mouth 3 (three) times daily as needed for dizziness. Margarita Mail, PA-C  Active   metoprolol tartrate (LOPRESSOR) 50 MG tablet [id number]  TAKE 1/2 TABLET BY MOUTH IN THE MORNING AND 1 TABLET BY MOUTH IN THE EVENING Panosh, Standley Brooking, MD  Active   metroNIDAZOLE (METROCREAM) 0.75 % cream [id number]  APPLY TO Warrick Parisian, MD  Active   MULTIPLE VITAMIN PO [id number]  Take 1 tablet by mouth daily. [provider]  Active   MYRBETRIQ 25 MG TB24 tablet [id number]  Take 25 mg by mouth daily. [provider]  Active            Med Note Chanetta Marshall Mar 26, 2017 10:43 AM)    nitroGLYCERIN (NITROSTAT) 0.4 MG SL tablet [id number]  PLACE 1 TABLET UNDER TONGUE EVERY 5 MINUTES AS NEEDED FOR CHEST PAIN Burtis Junes, NP  Active   ondansetron (ZOFRAN) 4 MG tablet [id number]  Take 1 tablet (4 mg total) by mouth every 8 (eight) hours as needed for nausea or vomiting. Ludwig Clarks, DO  Active   RABEprazole (ACIPHEX) 20 MG tablet [id number]  TAKE 1 TABLET BY MOUTH EVERY DAY Panosh, Standley Brooking, MD  Active   Red Yeast Rice 600 MG CAPS [id number]  Take 1 capsule by mouth daily. [provider]  Active   tamsulosin (FLOMAX) 0.4 MG CAPS capsule [id number]  TAKE 1 CAPSULE BY MOUTH EVERY DAY Panosh, Standley Brooking, MD  Active   vitamin C (ASCORBIC ACID) 250 MG tablet [id number] Yes Take 250 mg by mouth daily. 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   Bilateral renal cysts 02/28/2012   Hepatic cyst 02/28/2012   Atherosclerosis 02/28/2012   Contact dermatitis 02/28/2012   Dermatitis 04/03/2011   BACK PAIN, LUMBAR 10/25/2010   NEURITIS 10/25/2010   RHINITIS, CHRONIC 05/11/2010   PALPITATIONS 05/09/2010   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   VITAMIN D DEFICIENCY 11/02/2009   BACK PAIN 11/02/2009   BLADDER NECK OBSTRUCTION 07/07/2009   ATRIAL FIBRILLATION 07/06/2009   CAD 01/22/2009   MUSCLE PAIN 01/22/2009   DYSPNEA ON EXERTION 08/21/2008   VERRUCA VULGARIS 12/31/2007   HYPERGLYCEMIA, BORDERLINE 12/31/2007   LUMBAR RADICULOPATHY, RIGHT 10/18/2007   HX, PERSONAL, MALIGNANCY, SKIN NEC 10/18/2007   COLONIC POLYPS, HX OF 10/18/2007   ECHOCARDIOGRAM, ABNORMAL 08/19/2007  HYPERLIPIDEMIA 07/25/2007   GERD 07/25/2007   BENIGN PROSTATIC HYPERTROPHY 07/25/2007   Essential hypertension 07/23/2007   ALLERGIC RHINITIS 07/23/2007    Immunization History  Administered Date(s) Administered   Fluad Quad(high Dose 65+) 09/23/2019, 09/02/2021   Influenza Split 10/13/2011, 09/02/2012, 12/29/2013   Influenza Whole 10/18/2007, 09/21/2009, 09/15/2010   Influenza, High Dose Seasonal PF 09/21/2014, 08/18/2015, 10/09/2016, 08/31/2017, 09/10/2018, 08/23/2020   Influenza-Unspecified 08/31/2017   PFIZER Comirnaty(Gray Top)Covid-19 Tri-Sucrose Vaccine 08/23/2021   PFIZER(Purple Top)SARS-COV-2 Vaccination 02/17/2020, 03/09/2020, 11/30/2020, 08/23/2021   Pneumococcal Conjugate-13 07/27/2014   Pneumococcal Polysaccharide-23 02/09/2016   Tdap 08/18/2015   Zoster  Recombinat (Shingrix) 10/16/2020, 04/20/2021   Zoster, Live 03/12/2014   Patient is having a little bit of nausea and this happens for a couple minutes and then subsides. He reports he can handle it for the most part but this happens with his flare ups for Parkinson's.   Patient is having some dizziness every once in a while. He thinks he has had some vertigo but nothing recently or anything serious. He also has headaches sometimes that last 1/2 an hour and minimal chest pain on left or right side but it doesn't last for very long. Recommended checking when symptomatic.  Conditions to be addressed/monitored:  Hypertension, Hyperlipidemia, Atrial Fibrillation, GERD, Overactive Bladder, BPH, and Parkinson's disease  Conditions addressed this visit: Hypertension, Parkinson's disease   Care Plan : CCM Pharmacy Care Plan  Updates made by Viona Gilmore, [place] since 12/27/2021 12:00 AM     Problem: Problem: Hypertension, Hyperlipidemia, Atrial Fibrillation, GERD, Overactive Bladder, BPH, and Parkinson's disease      Long-Range Goal: Patient-Specific Goal   Start Date: 06/24/2021  Expected End Date: 06/24/2022  Recent Progress: On track  Priority: High  Note:   Current Barriers:  Unable to independently monitor therapeutic efficacy Unable to achieve control of cholesterol   Pharmacist Clinical Goal(s):  Patient will achieve adherence to monitoring guidelines and medication adherence to achieve therapeutic efficacy achieve control of cholesterol as evidenced by next lipid panel  through collaboration with PharmD and provider.   Interventions: 1:1 collaboration with Panosh, Standley Brooking, MD regarding development and update of comprehensive plan of care as evidenced by provider attestation and co-signature Inter-disciplinary care team collaboration (see longitudinal plan of care) Comprehensive medication review performed; medication list updated in electronic medical record  Hypertension (BP goal  <140/90) -Controlled -Current treatment: Metoprolol tartrate 50 mg 1/2 tablet in the morning and 1 tablet in the evening - appropriate, effective, safe, accessible -Medications previously tried: none  -Current home readings: 118/80 (morning after he took medicine), pulse was 91 -Current dietary habits: patient avoids adding extra salt but a lot of his fast food meals and TV dinners have sodium -Current exercise habits: limited -Denies hypotensive/hypertensive symptoms -Educated on Importance of home blood pressure monitoring; Proper BP monitoring technique; -Counseled to monitor BP at home weekly, document, and provide log at future appointments -Counseled on diet and exercise extensively Recommended to continue current medication  Hyperlipidemia/CAD: (LDL goal < 70) -Uncontrolled -Current treatment: Omega 3 complex 1,200 mg one gummy daily - query appropriate Red yeast rice 1 capsule daily - query appropriate -Medications previously tried: atorvastatin, pravastatin (myalgias), Zetia -Current dietary patterns: did not discuss -Current exercise habits: does not routinely exercise -Educated on Cholesterol goals;  Importance of limiting foods high in cholesterol; -Counseled on lack of benefit from fish oil with current dose and can consider stopping depending on results of lipid panel  CAD (Goal: prevent heart events) -Uncontrolled (  no statin) -Current treatment  Aspirin 81 mg 1 tablet daily Nitroglycerin 0.4 mg SL tablet as needed -Medications previously tried: none  -Recommended to continue current medication   Atrial Fibrillation (Goal: prevent stroke and major bleeding) -Controlled -CHADSVASC: 3 -Current treatment: Rate control: Metoprolol tartrate 50 mg 1/2 tablet in the morning and 1 tablet in the evening - appropriate, effective, safe, accessible Anticoagulation: aspirin 81 mg 1 tablet daily - query appropriate without DOAC -Medications previously tried: none -Home BP  and HR readings: refer to above -Counseled on avoidance of NSAIDs due to increased bleeding risk with anticoagulants; -Recommended to continue current medication  GERD (Goal: minimize symptoms) -Controlled -Current treatment  Rabeprazole 20 mg 1 tablet daily - appropriate, effective, safe, accessible -Medications previously tried: none  -Counseled on non-pharmacologic management of symptoms such as elevating the head of your bed, avoiding eating 2-3 hours before bed, avoiding triggering foods such as acidic, spicy, or fatty foods, eating smaller meals, and wearing clothes that are loose around the waist  BPH (Goal: minimize symptoms of enlarged prostate) -Controlled -Current treatment  Tamsulosin 0.4 mg 1 capsule daily - appropriate, effective, safe, accessible -Medications previously tried: none  -Recommended discussion with urologist.  Overactive bladder (Goal: minimize symptoms) -Not ideally controlled -Current treatment  Myrbetriq 25 mg 1 tablet daily - appropriate, effective, safe, accessible -Medications previously tried: none  -Recommended discussion with urologist.  Parkinson's disease (Goal: minimize symptoms) -Controlled -Current treatment  Carbidopa-levodopa 25-100 mg 1 tablet three times daily -Medications previously tried: none  -Recommended to continue current medication Counseled on proper administration times and side effects with medication.   Health Maintenance -Vaccine gaps: COVID booster -Current therapy:  Metronidazole cream 0.75% as needed Tylenol 500 mg 1 tablet in AM and 1 tablet in PM Ibuprofen as needed Vitamin D3 5000 units 1 capsule daily Coenzyme Q10 gummy 100 mg 1 tablet daily Magnesium 400 mg gummy 1 tablet daily Multivitamin 1 tablet daily -Educated on Cost vs benefit of each product must be carefully weighed by individual consumer -Patient is satisfied with current therapy and denies issues -Recommended decreasing vitamin C  supplementation to 250 mg daily and decreasing vitamin D intake to avoid oversupplementation.  Patient Goals/Self-Care Activities Patient will:  - take medications as prescribed check blood pressure weekly, document, and provide at future appointments target a minimum of 150 minutes of moderate intensity exercise weekly  Follow Up Plan: Telephone follow up appointment with care management team member scheduled for: 6 months      Medication Assistance: None required.  Patient affirms current coverage meets needs.  Compliance/Adherence/Medication fill history: Care Gaps: COVID booster  Star-Rating Drugs: None  Patient's preferred pharmacy is:  [id number]: [phone]: [phone]  Uses pill box? No - wife gives him his medications - recommended using a pill box or setting reminders but patient would forget or ignore the alarm (he denies any recently missed doses)    Pt endorses 100% compliance  We discussed: Current pharmacy is preferred with insurance plan and patient is satisfied with pharmacy services Patient decided to: Continue current medication management strategy  Care Plan and Follow Up Patient Decision:  Patient agrees to Care Plan and Follow-up.  Plan: Telephone follow up appointment with care management team member scheduled for:  6 months  MJeni Salles PharmD, BPottawattamiePharmacist LHickory [place] [phone]

## 2021-12-27 NOTE — Patient Instructions (Signed)
Hi Viaan,  It was great to catch up with you again! Don't forget to look into getting a bigger pill box (maybe separated into multiple times a day) to see if this helps with getting all of your vitamins on time as well  Please reach out to me if you have any questions or need anything before our follow up!  Best, Maddie  Jeni Salles, PharmD, [facility]   Visit Information   Goals Addressed   None    Patient Care Plan: CCM Pharmacy Care Plan     Problem Identified: Problem: Hypertension, Hyperlipidemia, Atrial Fibrillation, GERD, Overactive Bladder, BPH, and Parkinson's disease      Long-Range Goal: Patient-Specific Goal   Start Date: 06/24/2021  Expected End Date: 06/24/2022  Recent Progress: On track  Priority: High  Note:   Current Barriers:  Unable to independently monitor therapeutic efficacy Unable to achieve control of cholesterol   Pharmacist Clinical Goal(s):  Patient will achieve adherence to monitoring guidelines and medication adherence to achieve therapeutic efficacy achieve control of cholesterol as evidenced by next lipid panel  through collaboration with PharmD and provider.   Interventions: 1:1 collaboration with Panosh, Standley Brooking, MD regarding development and update of comprehensive plan of care as evidenced by provider attestation and co-signature Inter-disciplinary care team collaboration (see longitudinal plan of care) Comprehensive medication review performed; medication list updated in electronic medical record  Hypertension (BP goal <140/90) -Controlled -Current treatment: Metoprolol tartrate 50 mg 1/2 tablet in the morning and 1 tablet in the evening - appropriate, effective, safe, accessible -Medications previously tried: none  -Current home readings: 118/80 (morning after he took medicine), pulse was 91 -Current dietary habits: patient avoids adding extra salt but a lot of his fast  food meals and TV dinners have sodium -Current exercise habits: limited -Denies hypotensive/hypertensive symptoms -Educated on Importance of home blood pressure monitoring; Proper BP monitoring technique; -Counseled to monitor BP at home weekly, document, and provide log at future appointments -Counseled on diet and exercise extensively Recommended to continue current medication  Hyperlipidemia/CAD: (LDL goal < 70) -Uncontrolled -Current treatment: Omega 3 complex 1,200 mg one gummy daily - query appropriate Red yeast rice 1 capsule daily - query appropriate -Medications previously tried: atorvastatin, pravastatin (myalgias), Zetia -Current dietary patterns: did not discuss -Current exercise habits: does not routinely exercise -Educated on Cholesterol goals;  Importance of limiting foods high in cholesterol; -Counseled on lack of benefit from fish oil with current dose and can consider stopping depending on results of lipid panel  CAD (Goal: prevent heart events) -Uncontrolled (no statin) -Current treatment  Aspirin 81 mg 1 tablet daily Nitroglycerin 0.4 mg SL tablet as needed -Medications previously tried: none  -Recommended to continue current medication   Atrial Fibrillation (Goal: prevent stroke and major bleeding) -Controlled -CHADSVASC: 3 -Current treatment: Rate control: Metoprolol tartrate 50 mg 1/2 tablet in the morning and 1 tablet in the evening - appropriate, effective, safe, accessible Anticoagulation: aspirin 81 mg 1 tablet daily - query appropriate without DOAC -Medications previously tried: none -Home BP and HR readings: refer to above -Counseled on avoidance of NSAIDs due to increased bleeding risk with anticoagulants; -Recommended to continue current medication  GERD (Goal: minimize symptoms) -Controlled -Current treatment  Rabeprazole 20 mg 1 tablet daily - appropriate, effective, safe, accessible -Medications previously tried: none  -Counseled on  non-pharmacologic management of symptoms such as elevating the head of your bed, avoiding eating 2-3 hours before bed, avoiding triggering foods  such as acidic, spicy, or fatty foods, eating smaller meals, and wearing clothes that are loose around the waist  BPH (Goal: minimize symptoms of enlarged prostate) -Controlled -Current treatment  Tamsulosin 0.4 mg 1 capsule daily - appropriate, effective, safe, accessible -Medications previously tried: none  -Recommended discussion with urologist.  Overactive bladder (Goal: minimize symptoms) -Not ideally controlled -Current treatment  Myrbetriq 25 mg 1 tablet daily - appropriate, effective, safe, accessible -Medications previously tried: none  -Recommended discussion with urologist.  Parkinson's disease (Goal: minimize symptoms) -Controlled -Current treatment  Carbidopa-levodopa 25-100 mg 1 tablet three times daily -Medications previously tried: none  -Recommended to continue current medication Counseled on proper administration times and side effects with medication.   Health Maintenance -Vaccine gaps: COVID booster -Current therapy:  Metronidazole cream 0.75% as needed Tylenol 500 mg 1 tablet in AM and 1 tablet in PM Ibuprofen as needed Vitamin D3 5000 units 1 capsule daily Coenzyme Q10 gummy 100 mg 1 tablet daily Magnesium 400 mg gummy 1 tablet daily Multivitamin 1 tablet daily -Educated on Cost vs benefit of each product must be carefully weighed by individual consumer -Patient is satisfied with current therapy and denies issues -Recommended decreasing vitamin C supplementation to 250 mg daily and decreasing vitamin D intake to avoid oversupplementation.  Patient Goals/Self-Care Activities Patient will:  - take medications as prescribed check blood pressure weekly, document, and provide at future appointments target a minimum of 150 minutes of moderate intensity exercise weekly  Follow Up Plan: Telephone follow up  appointment with care management team member scheduled for: 6 months       Patient verbalizes understanding of instructions provided today and agrees to view in [facility].  Telephone follow up appointment with pharmacy team member scheduled for: 6 months  Viona Gilmore, [facility]

## 2021-12-27 NOTE — Chronic Care Management (AMB) (Signed)
° ° °  Chronic Care Management Pharmacy Assistant   Name: [name]  MRN: [id number] DOB: [birth year]    Called Patient No answer, left message of appointment on 12/27/20 at 1 via telephone visit with Jeni Salles, Pharm D.   Notified to have all medications, supplements, blood pressure and/or blood sugar logs available during appointment and to return call if need to reschedule.     Care Gaps: COVID Booster #4(Pfizer) - Overdue Flu Vaccine - Overdue CCM F/U - 12/2021 AWV - Office aware to schedule BP- 169/88 (ED) 128/78 (home/ sept) Lab Results  Component Value Date   HGBA1C 6.0 03/28/2021    Star Rating Drug: None       Medications: Outpatient Encounter Medications as of 12/27/2021  Medication Sig   acetaminophen (TYLENOL) 500 MG tablet Take 500 mg by mouth in the morning and at bedtime.   Ascorbic Acid (VITAMIN C PO) Take by mouth.   aspirin 81 MG tablet Take 81 mg by mouth daily.   Baclofen 5 MG TABS Take 5 mg by mouth 3 (three) times daily. As needed for neck spasm caution for sedation   Carbidopa-Levodopa ER (SINEMET CR) 25-100 MG tablet controlled release Take 1 tablet by mouth 3 (three) times daily.   Cholecalciferol (VITAMIN D3) 125 MCG (5000 UT) CAPS Take 1 capsule (5,000 Units total) by mouth daily.   clobetasol (OLUX) 0.05 % topical foam APPLY to back and leg   Coenzyme Q10 100 MG TABS Take 1 tablet by mouth daily.   Cyanocobalamin (B-12) 100 MCG TABS Take 1 tablet by mouth. Once every three days   DHA-EPA-Vitamin E (OMEGA-3 COMPLEX PO) Take 1,200 mg by mouth 3 (three) times daily. ProOmega LDL   magnesium oxide (MAG-OX) 400 MG tablet Take 400 mg daily by mouth.   meclizine (ANTIVERT) 25 MG tablet Take 0.5-1 tablets (12.5-25 mg total) by mouth 3 (three) times daily as needed for dizziness.   metoprolol tartrate (LOPRESSOR) 50 MG tablet TAKE 1/2 TABLET BY MOUTH IN THE MORNING AND 1 TABLET BY MOUTH IN THE EVENING   metroNIDAZOLE (METROCREAM) 0.75 % cream  APPLY TO FACE   MULTIPLE VITAMIN PO Take 1 tablet by mouth daily.   MYRBETRIQ 25 MG TB24 tablet Take 25 mg by mouth daily.   nitroGLYCERIN (NITROSTAT) 0.4 MG SL tablet PLACE 1 TABLET UNDER TONGUE EVERY 5 MINUTES AS NEEDED FOR CHEST PAIN   ondansetron (ZOFRAN) 4 MG tablet Take 1 tablet (4 mg total) by mouth every 8 (eight) hours as needed for nausea or vomiting.   RABEprazole (ACIPHEX) 20 MG tablet TAKE 1 TABLET BY MOUTH EVERY DAY   Red Yeast Rice 600 MG CAPS Take 1 capsule by mouth daily.   tamsulosin (FLOMAX) 0.4 MG CAPS capsule TAKE 1 CAPSULE BY MOUTH EVERY DAY   Facility-Administered Encounter Medications as of 12/27/2021  Medication   0.9 %  sodium chloride infusion      Ned Clines [place] Clinical Pharmacist Assistant [phone]

## 2021-12-29 ENCOUNTER — Ambulatory Visit (INDEPENDENT_AMBULATORY_CARE_PROVIDER_SITE_OTHER): Payer: Medicare Other | Admitting: Physician Assistant

## 2021-12-29 ENCOUNTER — Other Ambulatory Visit: Payer: Self-pay

## 2021-12-29 DIAGNOSIS — L570 Actinic keratosis: Secondary | ICD-10-CM

## 2021-12-29 DIAGNOSIS — R21 Rash and other nonspecific skin eruption: Secondary | ICD-10-CM

## 2021-12-29 DIAGNOSIS — Z85828 Personal history of other malignant neoplasm of skin: Secondary | ICD-10-CM

## 2021-12-29 DIAGNOSIS — L2084 Intrinsic (allergic) eczema: Secondary | ICD-10-CM | POA: Diagnosis not present

## 2021-12-29 MED ORDER — CLOBETASOL PROPIONATE 0.05 % EX FOAM: CUTANEOUS | 10 refills | Status: AC

## 2021-12-29 MED ORDER — TRIAMCINOLONE ACETONIDE 0.1 % EX CREA: 1.0000 "application " | TOPICAL_CREAM | Freq: Every day | CUTANEOUS | 11 refills | Status: DC | PRN

## 2021-12-29 MED ORDER — CLOBETASOL PROPIONATE E 0.05 % EX CREA: 1.0000 "application " | TOPICAL_CREAM | Freq: Two times a day (BID) | CUTANEOUS | 11 refills | Status: AC

## 2022-01-02 ENCOUNTER — Encounter: Payer: Self-pay | Admitting: Physician Assistant

## 2022-01-02 NOTE — Progress Notes (Signed)
° °  Follow-Up Visit   Subjective  [name] is a 81 y.o. male who presents for the following: Rash (Here for rash that was on patients back now its spreading to his head. X years. It is very itchy. We have been giving him clobetasol cream and foam. Patient also says there is a lesion on back that is changing size. Ozzie Hoyle of non mole skin cancers.).   The following portions of the chart were reviewed this encounter and updated as appropriate:  Tobacco   Allergies   Meds   Problems   Med Hx   Surg Hx   Fam Hx       Objective  Well appearing patient in no apparent distress; mood and affect are within normal limits.  All skin waist up examined.  Posterior neck, back and legs Ill-defined pink papules/plaques with scale-crust.   Left Buccal Cheek, Right Parotid Area Erythematous patches with gritty scale.   Assessment & Plan  Intrinsic atopic dermatitis Posterior neck, back and legs  Anaerobic and Aerobic Culture - Posterior neck, back and legs  triamcinolone cream (KENALOG) 0.1 % - Posterior neck, back and legs Apply 1 application topically daily as needed.  clobetasol (OLUX) 0.05 % topical foam - Posterior neck, back and legs APPLY to back and leg  CLOBETASOL PROPIONATE E 0.05 % emollient cream - Posterior neck, back and legs Apply 1 application topically 2 (two) times daily.  Rash and other nonspecific skin eruption  Related Medications clobetasol (OLUX) 0.05 % topical foam APPLY to back and leg  CLOBETASOL PROPIONATE E 0.05 % emollient cream Apply 1 application topically 2 (two) times daily.  AK (actinic keratosis) (2) Right Parotid Area; Left Buccal Cheek  Destruction of lesion - Left Buccal Cheek, Right Parotid Area Complexity: simple   Destruction method: cryotherapy   Informed consent: discussed and consent obtained   Timeout:  patient name, date of birth, surgical site, and procedure verified Lesion destroyed using liquid nitrogen: Yes   Cryotherapy  cycles:  1 Outcome: patient tolerated procedure well with no complications   Post-procedure details: wound care instructions given    No atypical nevi noted at the time of the visit.  I, Haeley Fordham, PA-C, have reviewed all documentation's for this visit.  The documentation on 01/02/22 for the exam, diagnosis, procedures and orders are all accurate and complete.

## 2022-01-08 LAB — ANAEROBIC AND AEROBIC CULTURE
MICRO NUMBER:: 12844740
MICRO NUMBER:: 12844741
SPECIMEN QUALITY:: ADEQUATE
SPECIMEN QUALITY:: ADEQUATE

## 2022-01-10 ENCOUNTER — Telehealth: Payer: Self-pay

## 2022-01-10 MED ORDER — CLINDAMYCIN PHOSPHATE 1 % EX GEL: Freq: Two times a day (BID) | CUTANEOUS | 2 refills | Status: DC

## 2022-01-10 NOTE — Telephone Encounter (Signed)
Phone call to patient with his culture results. Voicemail left for patient to give the office a call back. 

## 2022-01-10 NOTE — Telephone Encounter (Signed)
-----   Message from Warren Danes, [place] sent at 01/10/2022  1:28 PM EST ----- RX Clindamycin cream qd -BID 2rf

## 2022-01-10 NOTE — Telephone Encounter (Signed)
Phone call from patient's wife returning our phone call.  Patient's culture results given to his wife.

## 2022-01-15 ENCOUNTER — Other Ambulatory Visit: Payer: Self-pay | Admitting: Internal Medicine

## 2022-01-18 ENCOUNTER — Other Ambulatory Visit: Payer: Self-pay | Admitting: Neurology

## 2022-01-18 ENCOUNTER — Other Ambulatory Visit: Payer: Self-pay | Admitting: Internal Medicine

## 2022-01-24 DIAGNOSIS — G20 Parkinson's disease: Secondary | ICD-10-CM | POA: Diagnosis not present

## 2022-01-24 DIAGNOSIS — I10 Essential (primary) hypertension: Secondary | ICD-10-CM

## 2022-01-28 ENCOUNTER — Other Ambulatory Visit: Payer: Self-pay | Admitting: Neurology

## 2022-01-28 ENCOUNTER — Other Ambulatory Visit: Payer: Self-pay | Admitting: Internal Medicine

## 2022-02-16 NOTE — Progress Notes (Signed)
Assessment/Plan:   1.  Parkinsons Disease,   although atypical state like PSP cannot be r/o given diplopia, anterocollis and urinary issues early (likely due to prostate issues)  -increase carbidopa/levodopa 25/100, 2 tablet at 9 AM/2 at 1 PM/1 at 5 PM             -Discussed extensively how levodopa interacts with protein.             -MBE in May, 2022 was normal.  -PT recommended but declined.   2.  Vertigo             -hx of bppv             -improved     3.  Nausea             -Still not convinced all related to levodopa, since he has nausea upon waking up in the morning.  Switching to the CR version of levodopa did help some.              -having trouble with GERD and wonder if nausea related to that.  Will send back to [facility] - saw Dr. Ardis Hughs in the past.             -He asks for a refill for Zofran.  I told him this would be the last refill from [place].  I want to make sure we are not covering up something more significant.  4.  Near syncope  -wonder if related to metoprolol, although BP good/high today  -discussed concept of permissive HTN with Parkinsons Disease  -he is to f/u with Dr. Johnsie Cancel  5.  Constipation  -discussed nature and pathophysiology and association with PD  -discussed importance of hydration.  Pt is to increase water intake  -pt is given a copy of the rancho recipe  -recommended daily colace  -recommended miralax prn      Subjective:   [name] was seen today in follow-up.  Last visit, which was via video in August, we stopped his immediate release levodopa and changed him to extended release, in case it was contributing to nausea.  He reports that its better but its not all gone.  However, I did tell him last visit that I was not convinced it was all related to levodopa because he had nausea upon awakening in the morning and wondered if some of that was related to reflux.  I wanted the patient to follow back up with [facility], Dr. Ardis Hughs, whom he  had seen in the past.  I do not see that was accomplished.  Pt states that this past Sunday he got behind on his medication because he was busy with grandkids, and he ended up with N/V and what sounds like near syncope.  Current movement disorder medication: Carbidopa/levodopa 25/100 CR, 1 tablet 3 times per day at 9 AM/1 PM/5 PM (started last visit)  Prior meds: Carbidopa/levodopa 25/100 IR (stopped in case was contributing to nausea)  ALLERGIES:   Allergies  Allergen Reactions   Propoxyphene N-Acetaminophen Nausea And Vomiting   Iodinated Contrast Media Swelling    Pt developed slight lt upper lip swelling only to one side about 15 minutes post IV contrast injection. No other symptoms.     CURRENT MEDICATIONS:  Current Outpatient Medications  Medication Instructions   acetaminophen (TYLENOL) 500 MG tablet Oral   acetaminophen (TYLENOL) 500 mg, Oral, 2 times daily   aspirin 81 MG chewable tablet  81  mg, = 1 tab, Oral, Daily, 30 tab, 0 Refill(s), Do Not Route   aspirin 81 mg, Oral, Daily,     Baclofen 5 mg, Oral, 3 times daily, As needed for neck spasm caution for sedation   Carbidopa-Levodopa ER (SINEMET CR) 25-100 MG tablet controlled release TAKE 1 TABLET BY MOUTH THREE TIMES A DAY   Cholecalciferol 125 MCG (5000 UT) capsule  125 mcg = 1 cap, Cap, Oral, Daily, 100 cap, 0 Refill(s), with food   clindamycin (CLINDAGEL) 1 % gel Topical, 2 times daily   clobetasol (OLUX) 0.05 % topical foam APPLY to back and leg   CLOBETASOL PROPIONATE E 0.05 % emollient cream 1 application, Topical, 2 times daily   Coenzyme Q10 100 MG capsule  100 mg = 1 cap, Cap, Oral, Daily, 30 cap, 0 Refill(s)   Coenzyme Q10 100 MG TABS 1 tablet, Oral, Daily   Cyanocobalamin (B-12) 100 MCG TABS 1 tablet, Oral, Once every three days   DHA-EPA-Vitamin E (OMEGA-3 COMPLEX PO) 1,200 mg, Oral, 3 times daily, ProOmega LDL    magnesium oxide (MAG-OX) 400 mg, Oral, Daily   meclizine (ANTIVERT) 12.5-25 mg, Oral, 3 times  daily PRN   methocarbamol (ROBAXIN) 500 MG tablet Take by mouth.   metoprolol tartrate (LOPRESSOR) 50 MG tablet TAKE 1/2 TABLET BY MOUTH IN THE MORNING AND 1 TABLET BY MOUTH IN THE EVENING   metroNIDAZOLE (METROCREAM) 0.75 % cream APPLY TO FACE   metroNIDAZOLE (METROCREAM) 0.75 % cream Topical   MULTIPLE VITAMIN PO 1 tablet, Oral, Daily   Myrbetriq 25 mg, Oral, Daily   nitroGLYCERIN (NITROSTAT) 0.4 MG SL tablet PLACE 1 TABLET UNDER TONGUE EVERY 5 MINUTES AS NEEDED FOR CHEST PAIN   ondansetron (ZOFRAN) 4 mg, Oral, Every 8 hours PRN   RABEprazole (ACIPHEX) 20 MG tablet TAKE 1 TABLET BY MOUTH EVERY DAY   Red Yeast Rice 600 MG CAPS 1 capsule, Oral, Daily   Red Yeast Rice Extract 600 MG CAPS  600 mg = 1 cap, Oral, Daily, 0 Refill(s)   tamsulosin (FLOMAX) 0.4 MG CAPS capsule TAKE 1 CAPSULE BY MOUTH EVERY DAY   triamcinolone cream (KENALOG) 0.1 % 1 application, Topical, Daily PRN   vitamin C (ASCORBIC ACID) 250 mg, Oral, Daily   Vitamin D3 5,000 Units, Oral, Daily    Objective:   VITALS:   Vitals:   02/17/22 1439  BP: (!) 143/61  Pulse: 64  SpO2: 95%  Weight: 202 lb 6.4 oz (91.8 kg)  Height: 5\' 7"  (1.702 m)     GEN:  The patient appears stated age and is in NAD. HEENT:  Normocephalic, atraumatic.  The mucous membranes are moist. The superficial temporal arteries are without ropiness or tenderness. CV:  RRR Lungs:  CTAB Neck/HEME:  There are no carotid bruits bilaterally.  Neck is forward flexed.    Neurological examination:  Orientation: The patient is alert and oriented x3.  Cranial nerves: There is good facial symmetry. There is facial hypomimia.  Extraocular muscles are intact. The visual fields are full to confrontational testing. The speech is fluent and clear. He is hypophonic.  Soft palate rises symmetrically and there is no tongue deviation. Hearing is intact to conversational tone. Sensation: Sensation is intact to light and pinprick throughout (facial, trunk,  extremities). Vibration is intact at the bilateral big toe. There is no extinction with double simultaneous stimulation. There is no sensory dermatomal level identified. Motor: Strength is 5/5 in the bilateral upper and lower extremities.   Shoulder shrug  is equal and symmetric.  There is no pronator drift. Deep tendon reflexes: Deep tendon reflexes are 2/4 at the bilateral biceps, triceps, brachioradialis, patella and achilles. Plantar responses are downgoing bilaterally.  Movement examination: Tone: There is mild increased tone in the bilateral UE, R>L Abnormal movements: none Coordination:  There is decremation with RAM's, with any form of RAMS, including alternating supination and pronation of the forearm, hand opening and closing, finger taps, heel taps and toe taps, R>L Gait and Station: The patient has difficulty arising out of a deep-seated chair without the use of the hands. The patient's stride length is decreased.  He is flexed at the waist.  Slightly drags the R leg.   I have reviewed and interpreted the following labs independently   Chemistry      Component Value Date/Time   NA 136 08/09/2021 2000   NA 141 07/14/2020 1407   K 3.9 08/09/2021 2000   CL 102 08/09/2021 2000   CO2 24 08/09/2021 2000   BUN 13 08/09/2021 2000   BUN 15 07/14/2020 1407   CREATININE 0.72 08/09/2021 2000      Component Value Date/Time   CALCIUM 9.1 08/09/2021 2000   ALKPHOS 66 08/09/2021 2000   AST 18 08/09/2021 2000   ALT 7 08/09/2021 2000   BILITOT 0.9 08/09/2021 2000   BILITOT 0.6 10/13/2019 1523      Lab Results  Component Value Date   TSH 1.49 03/28/2021   Lab Results  Component Value Date   WBC 9.6 08/09/2021   HGB 15.2 08/09/2021   HCT 44.7 08/09/2021   MCV 94.3 08/09/2021   PLT 232 08/09/2021     Total time spent on today's visit was 32 minutes, including both face-to-face time and nonface-to-face time.  Time included that spent on review of records (prior notes available to  me/labs/imaging if pertinent), discussing treatment and goals, answering patient's questions and coordinating care.  Cc:  Panosh, Standley Brooking, MD

## 2022-02-17 ENCOUNTER — Ambulatory Visit (INDEPENDENT_AMBULATORY_CARE_PROVIDER_SITE_OTHER): Payer: Medicare Other | Admitting: Neurology

## 2022-02-17 ENCOUNTER — Other Ambulatory Visit: Payer: Self-pay

## 2022-02-17 ENCOUNTER — Telehealth: Payer: Self-pay

## 2022-02-17 ENCOUNTER — Encounter: Payer: Self-pay | Admitting: Neurology

## 2022-02-17 VITALS — BP 143/61 | HR 64 | Ht 67.0 in | Wt 202.4 lb

## 2022-02-17 DIAGNOSIS — R55 Syncope and collapse: Secondary | ICD-10-CM | POA: Diagnosis not present

## 2022-02-17 DIAGNOSIS — K219 Gastro-esophageal reflux disease without esophagitis: Secondary | ICD-10-CM

## 2022-02-17 DIAGNOSIS — R110 Nausea: Secondary | ICD-10-CM

## 2022-02-17 DIAGNOSIS — G20 Parkinson's disease: Secondary | ICD-10-CM | POA: Diagnosis not present

## 2022-02-17 MED ORDER — CARBIDOPA-LEVODOPA ER 25-100 MG PO TBCR
EXTENDED_RELEASE_TABLET | ORAL | 1 refills | Status: DC
Start: 2022-02-17 — End: 2022-07-27

## 2022-02-17 MED ORDER — ONDANSETRON HCL 4 MG PO TABS: 4.0000 mg | ORAL_TABLET | Freq: Three times a day (TID) | ORAL | 0 refills | Status: AC | PRN

## 2022-02-17 NOTE — Telephone Encounter (Signed)
I asked patient if he wanted to do PT patient declined said he just finished PT for several other injuries so at this time he declined

## 2022-02-17 NOTE — Patient Instructions (Signed)
We have scheduled an appointment for you at [facility] [place], [place]: [phone]  Fax: [phone] you will be seeing

## 2022-02-17 NOTE — Patient Instructions (Addendum)
Increase carbidopa/levodopa 25/100, 2 tablet at 9 AM/2 at 1 PM/1 at 5 PM I refilled your zofran but further refills need to come from GI or primary care I sent a referral to GI (gastroenterology).    Constipation and Parkinson's disease:  1.Rancho recipe for constipation in Parkinsons Disease:  -1 cup of unprocessed bran (need to get this at [employer], [employer] or similar type of store), 2 cups of applesauce in 1 cup of prune juice 2.  Increase fiber intake (Metamucil,vegetables) 3.  Regular, moderate exercise can be beneficial. 4.  Avoid medications causing constipation, such as medications like antacids with calcium or magnesium 5.  It's okay to take daily Miralax, and taper if stools become too loose or you experience diarrhea 6.  Stool softeners (Colace) can help with chronic constipation and I recommend you take this daily. 7.  Increase water intake.  You should be drinking 1/2 gallon of water a day as long as you have not been diagnosed with congestive heart failure or renal/kidney failure.  This is probably the single greatest thing that you can do to help your constipation. [facility]  [phone] Dr. Ardis Hughs Monday March 27th 9:30 AM  [place], [place]

## 2022-02-20 ENCOUNTER — Telehealth: Payer: Self-pay | Admitting: Cardiovascular Disease

## 2022-02-20 NOTE — Progress Notes (Signed)
CARDIOLOGY OFFICE NOTE  Date:  02/23/2022    [name] Date of Birth: Nov 04, 1941 Medical Record #[id number]  PCP:  [name], MD  Cardiologist:   Johnsie Cancel   History of Present Illness: [name] is a 81 y.o. male who presents today for dizziness.  He has seen neurology for vertigo and parkinsons with recent increase in his Parkinson's meds    He has a history of known CAD with prior CABG in 2009. Other issues include HLD, BPH and HTN. He had repeat cath back in 2013 with patent grafts noted. Lesion in a small unbypassed OM to be managed medically.  He has had muscle aches/pains/weakness with statin therapy. He has been on altered doses of Lipitor in the past with poor tolerance reported.    He is on low dose beta blocker for palpitations as he has first degree AV block  He has had chronic back pain. He is not active. More limited by his back. Has not tolerated statins or Zetia in the past. Balance has gotten poor.    He underwent back surgery at [facility] in June 2021  - with Dr. Owens Shark at [place] - needed some medicine adjustments after he got back home due to concerns for BP/HR. ARB stopped   Has had some dizzy spells over the last 6 months seem clearly related to change in Parkinso's meds and postural.   Past Medical History:  Diagnosis Date   Abdominal pain, right lateral 02/21/2012   progressive.     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    Cataract    COLONIC POLYPS, HX OF 10/18/2007   Cough 10/25/2010   DIZZINESS 01/22/2009   DYSPNEA ON EXERTION 08/21/2008   ECHOCARDIOGRAM, ABNORMAL 08/19/2007   GERD 07/25/2007   Heart murmur    HERNIA 07/06/2009   HX, PERSONAL, MALIGNANCY, SKIN NEC 10/18/2007   HYPERGLYCEMIA, BORDERLINE 12/31/2007   HYPERLIPIDEMIA 07/25/2007   HYPERTENSION 07/23/2007   Inguinal hernia    LUMBAR RADICULOPATHY, RIGHT 10/18/2007   MUSCLE PAIN 01/22/2009   Nausea    NEURITIS 10/25/2010   Palpitations 05/09/2010   RHINITIS, CHRONIC 05/11/2010   RUQ PAIN 01/22/2009   Sinus polyp    UNS ADVRS EFF OTH RX MEDICINAL&BIOLOGICAL SBSTNC 04/15/2008   VERRUCA VULGARIS 12/31/2007   VITAMIN D DEFICIENCY 11/02/2009   Voice strain     Past Surgical History:  Procedure Laterality Date   BACK SURGERY     basal cell and melanoma removed     face   BONY PELVIS SURGERY     CATARACT EXTRACTION     BIL   COLONOSCOPY     CORONARY ARTERY BYPASS GRAFT  2009   HERNIA REPAIR  7 times   inguinal   LAPAROSCOPIC CHOLECYSTECTOMY  03/03/2013   LUMBAR SPINE SURGERY     POLYPECTOMY     TONSILLECTOMY  Medications: Chambers Meds  Medication Sig   acetaminophen (TYLENOL) 500 MG tablet Take by mouth.   aspirin 81 MG chewable tablet  81 mg, = 1 tab, Oral, Daily, 30 tab, 0 Refill(s), Do Not Route   Carbidopa-Levodopa ER (SINEMET CR) 25-100 MG tablet controlled release 2 tablet at 9 AM/2 at 1 PM/1 at 5 PM   Cholecalciferol (VITAMIN D3) 125 MCG (5000 UT) CAPS Take 1 capsule (5,000 Units total) by mouth daily.   Cholecalciferol 125 MCG (5000 UT) capsule  125 mcg = 1 cap, Cap, Oral, Daily, 100 cap, 0 Refill(s), with food   clobetasol (OLUX) 0.05 % topical foam APPLY to back and leg   CLOBETASOL PROPIONATE E 0.05 % emollient cream Apply 1 application topically 2 (two) times daily.   Coenzyme Q10 100 MG capsule  100 mg = 1 cap, Cap, Oral, Daily, 30 cap, 0 Refill(s)   Cyanocobalamin (B-12) 100 MCG TABS Take 1 tablet by mouth. Once  every three days   DHA-EPA-Vitamin E (OMEGA-3 COMPLEX PO) Take 1,200 mg by mouth 3 (three) times daily. ProOmega LDL   magnesium oxide (MAG-OX) 400 MG tablet Take 400 mg daily by mouth.   meclizine (ANTIVERT) 25 MG tablet Take 0.5-1 tablets (12.5-25 mg total) by mouth 3 (three) times daily as needed for dizziness.   metoprolol tartrate (LOPRESSOR) 50 MG tablet TAKE 1/2 TABLET BY MOUTH IN THE MORNING AND 1 TABLET BY MOUTH IN THE EVENING   MULTIPLE VITAMIN PO Take 1 tablet by mouth daily.   MYRBETRIQ 25 MG TB24 tablet Take 25 mg by mouth daily.   ondansetron (ZOFRAN) 4 MG tablet Take 1 tablet (4 mg total) by mouth every 8 (eight) hours as needed for nausea or vomiting.   RABEprazole (ACIPHEX) 20 MG tablet TAKE 1 TABLET BY MOUTH EVERY DAY   tamsulosin (FLOMAX) 0.4 MG CAPS capsule TAKE 1 CAPSULE BY MOUTH EVERY DAY   vitamin C (ASCORBIC ACID) 250 MG tablet Take 250 mg by mouth daily.   [facility] Facility-Administered Medications for the 02/23/22 encounter (Office Visit) with Josue Hector, MD  Medication   0.9 %  sodium chloride infusion     Allergies: Allergies  Allergen Reactions   Propoxyphene N-Acetaminophen Nausea And Vomiting   Iodinated Contrast Media Swelling    Pt developed slight lt upper lip swelling only to one side about 15 minutes post IV contrast injection. No other symptoms.     Social History: The patient  reports that he quit smoking about 57 years ago. His smoking use included cigarettes. He has never used smokeless tobacco. He reports Chambers alcohol use. He reports that he does not use drugs.   Family History: The patient's family history includes Alcohol abuse in his brother, brother, brother, and father; Breast cancer in his sister; Cancer in his mother and sister; Heart attack in his father; Heart disease in his father; Hypertension in his brother, brother, brother, and father; Kidney disease in his sister; Lung cancer in his mother.   Review of Systems: Please see  the history of present illness.   All other systems are reviewed and negative.   Physical Exam: VS:  BP 130/78    Pulse 82    Ht 5\' 7"  (1.702 m)    Wt 201 lb (91.2 kg)    SpO2 97%    BMI 31.48 kg/m  .  BMI Body mass index is 31.48 kg/m.  Wt Readings from Last 3 Encounters:  02/23/22 201 lb (91.2 kg)  02/17/22 202  lb 6.4 oz (91.8 kg)  12/07/21 202 lb (91.6 kg)    General: Pleasant. Alert. Elderly. He is in no acute distress.   Cardiac: Regular rate and rhythm. No murmurs, rubs, or gallops. No edema.  Respiratory:  Lungs are clear to auscultation bilaterally with normal work of breathing.  GI: Soft and nontender.  MS: No deformity or atrophy. Gait and ROM intact. Using a care.  Skin: Warm and dry. Color is normal.  Neuro:  Strength and sensation are intact and no gross focal deficits noted.  Psych: Alert, appropriate and with normal affect.   LABORATORY DATA:  EKG:  EKG is not ordered today.    Lab Results  Component Value Date   WBC 9.6 08/09/2021   HGB 15.2 08/09/2021   HCT 44.7 08/09/2021   PLT 232 08/09/2021   GLUCOSE 118 (H) 08/09/2021   CHOL 180 10/13/2019   TRIG 126 10/13/2019   HDL 38 (L) 10/13/2019   LDLDIRECT 187.2 03/31/2008   LDLCALC 119 (H) 10/13/2019   ALT 7 08/09/2021   AST 18 08/09/2021   NA 136 08/09/2021   K 3.9 08/09/2021   CL 102 08/09/2021   CREATININE 0.72 08/09/2021   BUN 13 08/09/2021   CO2 24 08/09/2021   TSH 1.49 03/28/2021   PSA 1.49 02/21/2019   INR 1.1 (H) 01/25/2012   HGBA1C 6.0 03/28/2021     BNP (last 3 results) No results for input(s): BNP in the last 8760 hours.  ProBNP (last 3 results) No results for input(s): PROBNP in the last 8760 hours.    Other Studies Reviewed Today:  ECHO IMPRESSIONS 09/2019    1. Left ventricular ejection fraction, by visual estimation, is 65 to 70%. The left ventricle has normal function. Normal left ventricular size. There is mildly increased left ventricular hypertrophy.  2. Left  ventricular diastolic Doppler parameters are consistent with impaired relaxation pattern of LV diastolic filling.  3. Global right ventricle has normal systolic function.The right ventricular size is mildly enlarged. No increase in right ventricular wall thickness.  4. Left atrial size was mildly dilated.  5. Right atrial size was normal.  6. Mild aortic valve annular calcification.  7. The mitral valve is grossly normal. Trace mitral valve regurgitation. No evidence of mitral stenosis.  8. The tricuspid valve is normal in structure. Tricuspid valve regurgitation was not visualized by color flow Doppler.  9. The aortic valve is normal in structure. Aortic valve regurgitation was not visualized by color flow Doppler. Mild aortic valve sclerosis without stenosis. 10. There is Mild calcification of the aortic valve. 11. There is Mild thickening of the aortic valve. 12. The pulmonic valve was normal in structure. Pulmonic valve regurgitation is trivial by color flow Doppler. 13. The inferior vena cava is normal in size with greater than 50% respiratory variability, suggesting right atrial pressure of 3 mmHg.   In comparison to the previous echocardiogram(s): 11/13/17 EF 60-65%.       Holter Study Highlights 10/2017   NSR PAC;s / PVC;s No significant arrhythmias PVC;s account for less than 1% total beats      Echo Study Conclusions 10/2017   - Left ventricle: The cavity size was normal. There was moderate   concentric hypertrophy. Systolic function was normal. The   estimated ejection fraction was in the range of 60% to 65%. Wall   motion was normal; there were no regional wall motion   abnormalities. Doppler parameters are consistent with abnormal   left ventricular relaxation (grade  1 diastolic dysfunction).   Doppler parameters are consistent with elevated ventricular   end-diastolic filling pressure. - Aortic valve: Trileaflet; normal thickness leaflets. There was no    regurgitation. - Aortic root: The aortic root was normal in size. - Mitral valve: There was mild regurgitation. - Left atrium: The atrium was moderately dilated. - Right ventricle: Systolic function was normal. - Right atrium: The atrium was normal in size. - Tricuspid valve: There was trivial regurgitation. - Pulmonary arteries: Systolic pressure was within the normal   range. - Inferior vena cava: The vessel was normal in size. The   respirophasic diameter changes were in the normal range (= 50%),   consistent with normal central venous pressure. - Pericardium, extracardiac: There was no pericardial effusion.       ASSESSMENT & PLAN:     1. CAD - remote CABG - he has no exertional symptoms - would favor conservative management.   2. HTN - BP looks fine - will stay on Chambers regimen - remains off his ARB. Some permissive HTN given age, parkinson's and dizzy spells   3. HLD - does not tolerate therapy and does not wish to try anything else.   4. Spinal stenosis - prior back surgery - not sure how much this has really helped him.   5. 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 Aortic sclerosis:  no change in murmur  7. Dizzy Spells:  given AV nodal dx will check 30 day monitor BP and HR ok when in neurology office    30 day monitor     Disposition:   FU with [place] in a year    Patient is agreeable to this plan and will call if any problems develop in the interim.   Signed: Jenkins Rouge, MD  02/23/2022 10:09 AM  [facility] [address] [place], [place]  [address] Phone: [phone] Fax: [phone]

## 2022-02-20 NOTE — Telephone Encounter (Signed)
Called patient's wife (DPR) back about message. Patient has not been seen in office since 08/2020. Patient is over due for office visit. Patient has only had 4 dizzy spells in the last year. Asked if patient has taken his BP during these episodes, and patient has not. Encouraged patient's wife to take patient's BP and HR when he has these spells to see if patient's BP is actually too low. Encouraged patient to stay on his same dose of metoprolol until he can be evaluated at the office. On Friday patient saw neurology and BP at that visit was 143/61 HR 64. Patient is not having any symptoms right now. Made patient an appointment this week with Dr. Johnsie Cancel since he has not been seen in over a year.

## 2022-02-20 NOTE — Telephone Encounter (Signed)
Pt c/o medication issue:  1. Name of Medication: metoprolol tartrate (LOPRESSOR) 50 MG tablet Carbidopa-Levodopa ER (SINEMET CR) 25-100 MG tablet controlled release  2. How are you currently taking this medication (dosage and times per day)?  Metoprolol- 1.5 tablets daily Levodopa- 2 tablets at 9:00 am, 2 tablets at 1:00 pm, 2 tablets at 5:00 pm (q4h)   3. Are you having a reaction (difficulty breathing--STAT)? Dizziness   4. What is your medication issue? Wife is not sure what is causing the dizziness   STAT if patient feels like he/she is going to faint   Are you dizzy now? No   Do you feel faint or have you passed out? no  Do you have any other symptoms? Nausea and dizziness come together   Have you checked your HR and BP (record if available)?

## 2022-02-23 ENCOUNTER — Other Ambulatory Visit: Payer: Self-pay

## 2022-02-23 ENCOUNTER — Ambulatory Visit (INDEPENDENT_AMBULATORY_CARE_PROVIDER_SITE_OTHER): Payer: Medicare Other | Admitting: Cardiovascular Disease

## 2022-02-23 ENCOUNTER — Ambulatory Visit (INDEPENDENT_AMBULATORY_CARE_PROVIDER_SITE_OTHER): Payer: Medicare Other

## 2022-02-23 VITALS — BP 130/78 | HR 82 | Ht 67.0 in | Wt 201.0 lb

## 2022-02-23 DIAGNOSIS — R55 Syncope and collapse: Secondary | ICD-10-CM | POA: Diagnosis not present

## 2022-02-23 DIAGNOSIS — I259 Chronic ischemic heart disease, unspecified: Secondary | ICD-10-CM | POA: Diagnosis not present

## 2022-02-23 DIAGNOSIS — R011 Cardiac murmur, unspecified: Secondary | ICD-10-CM | POA: Diagnosis not present

## 2022-02-23 DIAGNOSIS — E7849 Other hyperlipidemia: Secondary | ICD-10-CM

## 2022-02-23 NOTE — Progress Notes (Unsigned)
[employer] [id number] from office inventory applied to patient. ?

## 2022-02-23 NOTE — Patient Instructions (Signed)
Medication Instructions:  ?Your physician recommends that you continue on your current medications as directed. Please refer to the Current Medication list given to you today. ? ?*If you need a refill on your cardiac medications before your next appointment, please call your pharmacy* ? ?Lab Work: ?If you have labs (blood work) drawn today and your tests are completely normal, you will receive your results only by: ?MyChart Message (if you have MyChart) OR ?A paper copy in the mail ?If you have any lab test that is abnormal or we need to change your treatment, we will call you to review the results. ? ?Testing/Procedures: ?Your physician has recommended that you wear an event monitor. Event monitors are medical devices that record the heart?s electrical activity. Doctors most often [place] these monitors to diagnose arrhythmias. Arrhythmias are problems with the speed or rhythm of the heartbeat. The monitor is a small, portable device. You can wear one while you do your normal daily activities. This is usually used to diagnose what is causing palpitations/syncope (passing out). ? ?Follow-Up: ?At [facility], you and your health needs are our priority.  As part of our continuing mission to provide you with exceptional heart care, we have created designated Provider Care Teams.  These Care Teams include your primary Cardiologist (physician) and Advanced Practice Providers (APPs -  Physician Assistants and Nurse Practitioners) who all work together to provide you with the care you need, when you need it. ? ?We recommend signing up for the patient portal called "MyChart".  Sign up information is provided on this After Visit Summary.  MyChart is used to connect with patients for Virtual Visits (Telemedicine).  Patients are able to view lab/test results, encounter notes, upcoming appointments, etc.  Non-urgent messages can be sent to your provider as well.   ?To learn more about what you can do with MyChart, go to  [email or web address].   ? ?Your next appointment:   ?4 month(s) ? ?The format for your next appointment:   ?In Person ? ?Provider:   ?Jenkins Rouge, MD { ? ?

## 2022-03-02 ENCOUNTER — Ambulatory Visit: Payer: Medicare Other | Admitting: Nurse Practitioner

## 2022-03-07 ENCOUNTER — Ambulatory Visit: Payer: Medicare Other | Admitting: Physician Assistant

## 2022-03-20 ENCOUNTER — Ambulatory Visit (INDEPENDENT_AMBULATORY_CARE_PROVIDER_SITE_OTHER): Payer: Medicare Other | Admitting: Gastroenterology

## 2022-03-20 ENCOUNTER — Encounter: Payer: Self-pay | Admitting: Gastroenterology

## 2022-03-20 VITALS — BP 108/64 | HR 78 | Ht 67.0 in | Wt 200.8 lb

## 2022-03-20 DIAGNOSIS — K2100 Gastro-esophageal reflux disease with esophagitis, without bleeding: Secondary | ICD-10-CM

## 2022-03-20 DIAGNOSIS — I259 Chronic ischemic heart disease, unspecified: Secondary | ICD-10-CM | POA: Diagnosis not present

## 2022-03-20 MED ORDER — OMEPRAZOLE 40 MG PO CPDR: 40.0000 mg | DELAYED_RELEASE_CAPSULE | Freq: Every day | ORAL | 11 refills | Status: DC

## 2022-03-20 MED ORDER — FAMOTIDINE 20 MG PO TABS: 20.0000 mg | ORAL_TABLET | Freq: Every day | ORAL | Status: DC

## 2022-03-20 NOTE — Progress Notes (Signed)
Review of pertinent gastrointestinal problems: ?1.  Precancerous colon polyps.  Colonoscopy for 2018 Dr. Ardis Hughs for Cologuard positive stool found for subcentimeter tubular adenomas, also left-sided diverticulosis.  Recommended repeat colonoscopy at 3-year interval ?2.  Right-sided abdominal pains led to EGD for 2013 which showed mild nonspecific gastritis.  Pathology was negative for H. pylori. ? ? ? ?HPI: ?This is a very pleasant 81 year old man ? ?I last saw him about 5 years ago the time of the colonoscopy.  See the results summarized above. ? ?He was diagnosed with Parkinson's a year or so ago.  He is moving quite slowly.  He is losing balance has a lot of weakness in his legs.  He walks with a cane generally.  He has a lot of nausea after he takes his L-dopa medicine. ? ?He has been constipated but started taking MiraLAX on a daily basis and that clearly helps.  He has not seen blood in his stool. ? ?He is bothered by acid reflux which she describes as pyrosis and burning in the throat with some regurg.  He takes an Aciphex 20 mg pill before breakfast sometimes after breakfast on a daily basis.  He has very thick saliva.  He has no dysphagia and his weight is overall stable. ? ?Old Data Reviewed: ?Speech therapy swallow evaluation 04/2021 showed normal swallow function.  This was done for throat clearing, coughing, globus with pills ? ? ? ?Review of systems: ?Pertinent positive and negative review of systems were noted in the above HPI section. All other review negative. ? ? ?Past Medical History:  ?Diagnosis Date  ? Abdominal pain, right lateral 02/21/2012  ? progressive.    ? ALLERGIC RHINITIS 07/23/2007  ? Allergy   ? Arthritis   ? Atrial fibrillation ([place]) 07/06/2009  ? Atypical mole 09/13/1992  ? left shoulder Dr Cheryln Manly exc  ? Atypical nevi 10/08/1992  ? right abdomen Dr Francisca December  ? BACK PAIN 11/02/2009  ? BACK PAIN, LUMBAR 10/25/2010  ? Basal cell carcinoma 08/06/1992  ? right post thigh Dr Cheryln Manly  ?  BCC (basal cell carcinoma of skin) 04/23/2018  ? left post shoulder tx with bx  ? BENIGN PROSTATIC HYPERTROPHY 07/25/2007  ? 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 CAD 01/22/2009  ? [facility]   ? skin  ? 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  ? Cataract   ? COLONIC POLYPS, HX OF 10/18/2007  ? Cough 10/25/2010  ? DIZZINESS 01/22/2009  ? DYSPNEA ON EXERTION 08/21/2008  ? ECHOCARDIOGRAM, ABNORMAL 08/19/2007  ? GERD 07/25/2007  ? Heart murmur   ? HERNIA 07/06/2009  ? HX, PERSONAL, MALIGNANCY, SKIN NEC 10/18/2007  ? HYPERGLYCEMIA, BORDERLINE 12/31/2007  ? HYPERLIPIDEMIA 07/25/2007  ? HYPERTENSION 07/23/2007  ? Inguinal hernia   ? LUMBAR RADICULOPATHY, RIGHT 10/18/2007  ? MUSCLE PAIN 01/22/2009  ? Nausea   ? NEURITIS 10/25/2010  ? Palpitations 05/09/2010  ? RHINITIS, CHRONIC 05/11/2010  ? RUQ PAIN 01/22/2009  ? Sinus polyp   ? UNS ADVRS EFF OTH RX MEDICINAL&BIOLOGICAL SBSTNC 04/15/2008  ? VERRUCA VULGARIS 12/31/2007  ? VITAMIN D DEFICIENCY 11/02/2009  ? Voice strain   ? ? ?Past Surgical History:  ?Procedure Laterality Date  ? BACK SURGERY    ? basal cell and melanoma removed    ? face  ? BONY PELVIS SURGERY    ? CATARACT EXTRACTION    ? BIL  ? COLONOSCOPY    ? CORONARY ARTERY BYPASS GRAFT  2009  ? HERNIA REPAIR  7 times  ? inguinal  ?  LAPAROSCOPIC CHOLECYSTECTOMY  03/03/2013  ? LUMBAR SPINE SURGERY    ? POLYPECTOMY    ? TONSILLECTOMY    ? ? ?Current Outpatient Medications  ?Medication Instructions  ? acetaminophen (TYLENOL) 500 MG tablet Oral  ? aspirin 81 MG chewable tablet  ?81 mg, = 1 tab, Oral, Daily, 30 tab, 0 Refill(s), Do Not Route  ? Carbidopa-Levodopa ER (SINEMET CR) 25-100 MG tablet controlled release 2 tablet at 9 AM/2 at 1 PM/1 at 5 PM  ? Cholecalciferol 125 MCG (5000 UT) capsule  ?125 mcg = 1 cap, Cap, Oral, Daily, 100 cap, 0 Refill(s), with food  ? clobetasol (OLUX) 0.05 % topical foam APPLY to back and leg  ? CLOBETASOL PROPIONATE E 0.05 % emollient cream 1 application., Topical, 2 times daily  ? Coenzyme Q10 100 MG capsule  ?100  mg = 1 cap, Cap, Oral, Daily, 30 cap, 0 Refill(s)  ? Cyanocobalamin (B-12) 100 MCG TABS 1 tablet, Oral, Once every three days  ? DHA-EPA-Vitamin E (OMEGA-3 COMPLEX PO) 1,200 mg, Oral, 3 times daily, ProOmega LDL   ? magnesium oxide (MAG-OX) 400 mg, Oral, Daily  ? meclizine (ANTIVERT) 12.5-25 mg, Oral, 3 times daily PRN  ? metoprolol tartrate (LOPRESSOR) 50 MG tablet TAKE 1/2 TABLET BY MOUTH IN THE MORNING AND 1 TABLET BY MOUTH IN THE EVENING  ? MULTIPLE VITAMIN PO 1 tablet, Oral, Daily  ? Myrbetriq 25 mg, Oral, Daily  ? nitroGLYCERIN (NITROSTAT) 0.4 MG SL tablet PLACE 1 TABLET UNDER TONGUE EVERY 5 MINUTES AS NEEDED FOR CHEST PAIN  ? ondansetron (ZOFRAN) 4 mg, Oral, Every 8 hours PRN  ? RABEprazole (ACIPHEX) 20 MG tablet TAKE 1 TABLET BY MOUTH EVERY DAY  ? tamsulosin (FLOMAX) 0.4 MG CAPS capsule TAKE 1 CAPSULE BY MOUTH EVERY DAY  ? vitamin C (ASCORBIC ACID) 250 mg, Oral, Daily  ? Vitamin D3 5,000 Units, Oral, Daily  ? ? ?Allergies as of 03/20/2022 - Review Complete 03/20/2022  ?Allergen Reaction Noted  ? Propoxyphene n-acetaminophen Nausea And Vomiting 07/23/2007  ? Iodinated contrast media Swelling 02/23/2012  ? ? ?Family History  ?Problem Relation Age of Onset  ? Lung cancer Mother   ? Cancer Mother   ? Heart attack Father   ? Hypertension Father   ? Alcohol abuse Father   ? Heart disease Father   ? Breast cancer Sister   ? Cancer Sister   ?     breast  ? Alcohol abuse Brother   ? Hypertension Brother   ? Alcohol abuse Brother   ? Hypertension Brother   ? Alcohol abuse Brother   ? Hypertension Brother   ? Kidney disease Sister   ? Colon cancer Neg Hx   ? Esophageal cancer Neg Hx   ? Rectal cancer Neg Hx   ? Stomach cancer Neg Hx   ? ? ?Social History  ? ?Socioeconomic History  ? Marital status: Married  ?  Spouse name: Not on file  ? Number of children: Not on file  ? Years of education: Not on file  ? Highest education level: Not on file  ?Occupational History  ? Not on file  ?Tobacco Use  ? Smoking status: Former   ?  Types: Cigarettes  ?  Quit date: 04/02/1964  ?  Years since quitting: 58.0  ? Smokeless tobacco: Never  ? Tobacco comments:  ?  in early 20's he quit  ?Vaping Use  ? Vaping Use: Never used  ?Substance and Sexual Activity  ? Alcohol use: Yes  ?  Comment: socially  ? Drug use: No  ? Sexual activity: Not on file  ?Other Topics Concern  ? Not on file  ?Social History Narrative  ? Retired [employer]  ? Married  ? Non smoker some etoh  ? Luz Lex a lot  ?   ?   ?   ? ?Social Determinants of Health  ? ?Financial Resource Strain: Low Risk   ? Difficulty of Paying Living Expenses: Not hard at all  ?Food Insecurity: No Food Insecurity  ? Worried About Charity fundraiser in the Last Year: Never true  ? Ran Out of Food in the Last Year: Never true  ?Transportation Needs: No Transportation Needs  ? Lack of Transportation (Medical): No  ? Lack of Transportation (Non-Medical): No  ?Physical Activity: Sufficiently Active  ? Days of Exercise per Week: 7 days  ? Minutes of Exercise per Session: 30 min  ?Stress: No Stress Concern Present  ? Feeling of Stress : Not at all  ?Social Connections: Socially Integrated  ? Frequency of Communication with Friends and Family: More than three times a week  ? Frequency of Social Gatherings with Friends and Family: More than three times a week  ? Attends Religious Services: More than 4 times per year  ? Active Member of Clubs or Organizations: Yes  ? Attends Archivist Meetings: More than 4 times per year  ? Marital Status: Married  ?Intimate Partner Violence: Not At Risk  ? Fear of Current or Ex-Partner: No  ? Emotionally Abused: No  ? Physically Abused: No  ? Sexually Abused: No  ? ? ? ?Physical Exam: ?BP 108/64   Pulse 78   Ht '5\' 7"'$  (1.702 m)   Wt 200 lb 12.8 oz (91.1 kg)   SpO2 96%   BMI 31.45 kg/m?  ?Constitutional: generally well-appearing ?Psychiatric: alert and oriented x3 ?Eyes: extraocular movements intact ?Mouth: oral pharynx moist, no lesions ?Neck: supple no  lymphadenopathy ?Cardiovascular: heart regular rate and rhythm ?Lungs: clear to auscultation bilaterally ?Abdomen: soft, nontender, nondistended, no obvious ascites, no peritoneal signs, normal bowel sounds ?Extremi

## 2022-03-20 NOTE — Patient Instructions (Addendum)
If you are age 83 or older, your body mass index should be between 23-30. Your Body mass index is 31.45 kg/m?Marland Kitchen If this is out of the aforementioned range listed, please consider follow up with your Primary Care Provider. ?________________________________________________________ ? ?The [facility] providers would like to encourage you to use [facility] to communicate with providers for non-urgent requests or questions.  Due to long hold times on the telephone, sending your provider a message by [facility] may be a faster and more efficient way to get a response.  Please allow 48 business hours for a response.  Please remember that this is for non-urgent requests.  ?_______________________________________________________ ? ?DISCONTINUE: Aciphex ? ?We have sent the following medications to your pharmacy for you to pick up at your convenience: ? ?START: omeprazole '40mg'$  one capsule shortly before breakfast meal each day. ? ?Please purchase the following medications over the counter and take as directed: ? ?START: famotidine '20mg'$  one tablet at bedtime each night. ? ?You are scheduled to follow up in our office on 05-23-22 at 9:50am ? ?Thank you for entrusting me with your care and choosing [facility]. ? ?Dr Ardis Hughs ? ? ?

## 2022-04-17 ENCOUNTER — Telehealth: Payer: Self-pay | Admitting: Internal Medicine

## 2022-04-17 NOTE — Progress Notes (Signed)
Call to wife she reports she was wanting to know if his famotidine and or omeprazole would be causing him to have joint pain. He describes as moderate level pain and almost daily since starting the two medications. ? ?She also would like a call back with her test results from last week. ? ? ? ? ?Ned Clines CMA ?Clinical Pharmacist Assistant ?[phone] ? ?

## 2022-04-17 NOTE — Telephone Encounter (Signed)
Pt wife is calling and would like maggie to return a call to her husband concerning a new medication. Pt wife does not know the name of new medication and she is aware maddie is out of office until wed ?

## 2022-04-18 NOTE — Chronic Care Management (AMB) (Signed)
Call to wife advised her of the recommendations below. She was in agreement to hold on him taking the Omeprazole for now and discuss further with [facility] office. She will also call the office to discuss her test results. ? ? ?Ned Clines CMA ?Clinical Pharmacist Assistant ?[phone] ? ?

## 2022-04-18 NOTE — Telephone Encounter (Signed)
It is rare, but omeprazole can cause joint pain according to some post-marketing studies. Famotidine does not appear to be related to joint pain. I would recommend holding omeprazole for a few days and calling his GI doctor for further recommendations. ? ?I don't see any test results for this patient in the past week. Ask the patient call the doctor who did the tests to discuss results. ?

## 2022-04-20 ENCOUNTER — Telehealth: Payer: Self-pay | Admitting: Gastroenterology

## 2022-04-20 NOTE — Telephone Encounter (Signed)
Dr Ardis Hughs the pt began taking omeprazole and Pepcid about 3 weeks ago and developed severe joint pain about a 1 1/2 weeks after starting.  He does have a hx of joint pain with cholesterol medications.  He stopped the omeprazole and Pepcid and the joint pain has resolved.  How would you like the pt to proceed? ?

## 2022-04-20 NOTE — Telephone Encounter (Signed)
I spoke with the pt's wife and advised her of the recommendations per Dr Ardis Hughs. She will have the pt restart omeprazole and call in 7 days to update.  ?

## 2022-04-20 NOTE — Telephone Encounter (Signed)
Inbound call from patients wife stating that since patient has started taking Omeprazole and Pepcid that he has had joint pains. Wife stated that for the last 2 days he has not taken the medications and has had no issues. Wife is requesting a call back to discuss and to see if maybe there is an alternative for the medications. Please advise.   ?

## 2022-05-01 ENCOUNTER — Telehealth: Payer: Self-pay | Admitting: Physician Assistant

## 2022-05-01 NOTE — Telephone Encounter (Signed)
Phone call to patient's wife to schedule an appointment for removal of the cyst once it's no longer inflamed. Patient's wife given the date of August 3rd for an appointment. Per patient's wife she doesn't want to schedule the patient for that appointment just yet she would like to know if there's anything else she can do and if she's doing the right thing for the patient and how much longer should she press the cyst?  I informed patient's wife that Vida Roller is out of the office today and that I can get a message to her in the morning and get her recommendations. Patient's wife aware.  ?

## 2022-05-01 NOTE — Telephone Encounter (Signed)
Per patient's wife cyst on back has gotten infected and is oozing. She is treating with Bactroban ointment and Band-Aid. She is interested in scheduling remove and Vida Roller has said she would do this. Sending message to clinical staff rather than scheduling because of possible infection. ?

## 2022-05-03 ENCOUNTER — Ambulatory Visit (INDEPENDENT_AMBULATORY_CARE_PROVIDER_SITE_OTHER): Payer: Medicare Other | Admitting: Family Medicine

## 2022-05-03 ENCOUNTER — Encounter: Payer: Self-pay | Admitting: Family Medicine

## 2022-05-03 VITALS — BP 112/60 | HR 67 | Temp 98.5°F | Wt 202.0 lb

## 2022-05-03 DIAGNOSIS — L089 Local infection of the skin and subcutaneous tissue, unspecified: Secondary | ICD-10-CM

## 2022-05-03 DIAGNOSIS — I259 Chronic ischemic heart disease, unspecified: Secondary | ICD-10-CM

## 2022-05-03 DIAGNOSIS — L729 Follicular cyst of the skin and subcutaneous tissue, unspecified: Secondary | ICD-10-CM

## 2022-05-03 MED ORDER — DOXYCYCLINE HYCLATE 100 MG PO TABS: 100.0000 mg | ORAL_TABLET | Freq: Two times a day (BID) | ORAL | 0 refills | Status: AC

## 2022-05-03 NOTE — Telephone Encounter (Signed)
Phone call to patient's wife with [facility] recommendations. Patient's wife aware of Kelli's recommendations. Per patient's wife they are on the way to the patient's PCP for him to be seen because the cyst is now painful to the patient. Patient's wife didn't want to schedule a surgical appointment at this time, she will call us back to schedule if the patient still needs to be seen after his PCP visit.  ?

## 2022-05-03 NOTE — Progress Notes (Signed)
Subjective:  ? ? Patient ID: [name], male    DOB: 1941-09-18, 81 y.o.   MRN: [id number] ? ?Chief Complaint  ?Patient presents with  ? Mass  ?  Spot on back, noticed it about a week ago and there was oozing on his shirt wen he took it off. Wife has been draining it ut has now started to hurt. Dark brown/ beige color ooze. Usually takes care of it himself, but could not as this one is on hi back and not able to reach it. Would put bacitracin on it after cleaning it.   ? ? ?HPI ?Patient is an 81 yo male with h/o Parkinson's dz, CAD, HTN, GERD, lumbar radiculopathy, aortic atherosclerosis, h/o basal cell carcinoma, was seen today for acute concern.  Pt with draining bump on L lower back that started becoming painful within the last day.  Pt states his wife was putting bacitracin ointment on it.  Bump present x 1 yr.  Pt notes h/o cysts.  Followed by Payton Mccallum.  Denies fever, chills, n/v, HA.  Taking ASA 81 mg daily. ? ?Past Medical History:  ?Diagnosis Date  ? Abdominal pain, right lateral 02/21/2012  ? progressive.    ? ALLERGIC RHINITIS 07/23/2007  ? Allergy   ? Arthritis   ? Atrial fibrillation ([place]) 07/06/2009  ? Atypical mole 09/13/1992  ? left shoulder Dr Cheryln Manly exc  ? Atypical nevi 10/08/1992  ? right abdomen Dr Francisca December  ? BACK PAIN 11/02/2009  ? BACK PAIN, LUMBAR 10/25/2010  ? Basal cell carcinoma 08/06/1992  ? right post thigh Dr Cheryln Manly  ? BCC (basal cell carcinoma of skin) 04/23/2018  ? left post shoulder tx with bx  ? BENIGN PROSTATIC HYPERTROPHY 07/25/2007  ? 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 CAD 01/22/2009  ? Cancer [facility])   ? skin  ? 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  ? Cataract   ? COLONIC POLYPS, HX OF 10/18/2007  ? Cough 10/25/2010  ? DIZZINESS 01/22/2009  ? DYSPNEA ON EXERTION 08/21/2008  ? ECHOCARDIOGRAM, ABNORMAL 08/19/2007  ? GERD 07/25/2007  ? Heart murmur   ? HERNIA 07/06/2009  ? HX, PERSONAL, MALIGNANCY, SKIN NEC 10/18/2007  ? HYPERGLYCEMIA, BORDERLINE 12/31/2007  ? HYPERLIPIDEMIA 07/25/2007  ?  HYPERTENSION 07/23/2007  ? Inguinal hernia   ? LUMBAR RADICULOPATHY, RIGHT 10/18/2007  ? MUSCLE PAIN 01/22/2009  ? Nausea   ? NEURITIS 10/25/2010  ? Palpitations 05/09/2010  ? RHINITIS, CHRONIC 05/11/2010  ? RUQ PAIN 01/22/2009  ? Sinus polyp   ? UNS ADVRS EFF OTH RX MEDICINAL&BIOLOGICAL SBSTNC 04/15/2008  ? VERRUCA VULGARIS 12/31/2007  ? VITAMIN D DEFICIENCY 11/02/2009  ? Voice strain   ? ? ?Allergies  ?Allergen Reactions  ? Propoxyphene N-Acetaminophen Nausea And Vomiting  ? Iodinated Contrast Media Swelling  ?  Pt developed slight lt upper lip swelling only to one side about 15 minutes post IV contrast injection. No other symptoms.   ? ? ?ROS ?General: Denies fever, chills, night sweats, changes in weight, changes in appetite ?HEENT: Denies headaches, ear pain, changes in vision, rhinorrhea, sore throat ?CV: Denies CP, palpitations, SOB, orthopnea ?Pulm: Denies SOB, cough, wheezing ?GI: Denies abdominal pain, nausea, vomiting, diarrhea, constipation ?GU: Denies dysuria, hematuria, frequency ?Msk: Denies muscle cramps, joint pains ?Neuro: Denies weakness, numbness, tingling ?Skin: Denies rashes, bruising +painful bump draining on back ?Psych: Denies depression, anxiety, hallucinations ? ? ?Objective:  ?  ?Blood pressure 112/60, pulse 67, temperature 98.5 ?F (36.9 ?C), temperature source Oral, weight 202 lb (91.6 kg), SpO2  95 %. ? ?Gen. Pleasant, well-nourished, in no distress, normal affect   ?HEENT: [place]/AT, face symmetric, conjunctiva clear, no scleral icterus, PERRLA, EOMI, nares patent without drainage ?Lungs: no accessory muscle use ?Cardiovascular: RRR, no m/r/g, no peripheral edema ?Musculoskeletal: No deformities, no cyanosis or clubbing, normal tone ?Neuro:  A&Ox3, CN II-XII intact, normal gait ?Skin:  Warm, dry, intact.  Draining cyst on L back lateral lower thoracic area/flank with erythema.  I&D performed. ? ? ?Wt Readings from Last 3 Encounters:  ?05/03/22 202 lb (91.6 kg)  ?03/20/22 200 lb 12.8 oz (91.1 kg)   ?02/23/22 201 lb (91.2 kg)  ? ? ?Lab Results  ?Component Value Date  ? WBC 9.6 08/09/2021  ? HGB 15.2 08/09/2021  ? HCT 44.7 08/09/2021  ? PLT 232 08/09/2021  ? GLUCOSE 118 (H) 08/09/2021  ? CHOL 180 10/13/2019  ? TRIG 126 10/13/2019  ? HDL 38 (L) 10/13/2019  ? LDLDIRECT 187.2 03/31/2008  ? LDLCALC 119 (H) 10/13/2019  ? ALT 7 08/09/2021  ? AST 18 08/09/2021  ? NA 136 08/09/2021  ? K 3.9 08/09/2021  ? CL 102 08/09/2021  ? CREATININE 0.72 08/09/2021  ? BUN 13 08/09/2021  ? CO2 24 08/09/2021  ? TSH 1.49 03/28/2021  ? PSA 1.49 02/21/2019  ? INR 1.1 (H) 01/25/2012  ? HGBA1C 6.0 03/28/2021  ? ? ?Incision and Drainage Procedure Note ? ?Pre-operative Diagnosis: infected cyst ? ?Post-operative Diagnosis: same ? ?Indications: pain from infected cyst.  Draining, but opening not large enough for debris to be completely expressed. ? ?Anesthesia: 3.5 cc 1% plain lidocaine ? ?Procedure Details  ?The procedure, risks and complications have been discussed in detail (including, but not limited to airway compromise, infection, bleeding) with the patient, and the patient has signed consent to the procedure. ? ?The skin was sterilely prepped and draped over the affected area in the usual fashion. ?After adequate local anesthesia, I&D with a #11 blade was performed on the left, lateral, lower thoracic back/flank. Purulent drainage: present ?The patient was observed until stable. ? ?EBL: minimal cc's ? ?Condition: Stable.  Tolerated procedure well. ? ?Complications: ?none. ? ?Assessment/Plan: ? On day of service, 45 minutes spent obtaining hx, performing procedure, caring for this patient face-to-face, reviewing the chart, counseling and/or coordinating care for plan and treatment of diagnosis below.   ? ?Infected cyst of skin  ?-consent obtained.  Cyst draining, but opening too small for adequate drainage.  I&D performed.  Copious amounts of thick whitish brown debris expressed.  Cyst sac removed.  Area packed with sterile packing.    ?-start abx ?-given handouts ?-given precautions. ?- Plan: doxycycline (VIBRA-TABS) 100 MG tablet ? ?F/u in 2-3 days for wound check. ? ?Grier Mitts, MD ?

## 2022-05-05 ENCOUNTER — Ambulatory Visit (INDEPENDENT_AMBULATORY_CARE_PROVIDER_SITE_OTHER): Payer: Medicare Other | Admitting: Family Medicine

## 2022-05-05 ENCOUNTER — Encounter: Payer: Self-pay | Admitting: Family Medicine

## 2022-05-05 VITALS — BP 122/88 | HR 81 | Temp 98.5°F | Wt 201.2 lb

## 2022-05-05 DIAGNOSIS — L089 Local infection of the skin and subcutaneous tissue, unspecified: Secondary | ICD-10-CM

## 2022-05-05 DIAGNOSIS — Z4800 Encounter for change or removal of nonsurgical wound dressing: Secondary | ICD-10-CM

## 2022-05-05 DIAGNOSIS — I259 Chronic ischemic heart disease, unspecified: Secondary | ICD-10-CM

## 2022-05-05 DIAGNOSIS — L729 Follicular cyst of the skin and subcutaneous tissue, unspecified: Secondary | ICD-10-CM

## 2022-05-05 NOTE — Progress Notes (Signed)
Subjective:  ? ? Patient ID: [name], male    DOB: 03-21-1941, 81 y.o.   MRN: [id number] ? ?Chief Complaint  ?Patient presents with  ? Follow-up  ?  Cyst on back  ? ? ?HPI ?Patient is an 81 yo male with pmh sig for Parkinson's Dz, arthritis, allergies, Afib, GERD, CAD, h/o skin cancer who was seen today for wound check.  Pt s/p I&D of infected cyst on L lateral back/flank on 05/03/22 by this provider.  Presents today for repacking.  Taking Doxycycline BID.  Having to delay timing of am dose 2/2 taking PPI for GERD.  Pt denies fever, chills, n/v, back pain. ? ?Past Medical History:  ?Diagnosis Date  ? Abdominal pain, right lateral 02/21/2012  ? progressive.    ? ALLERGIC RHINITIS 07/23/2007  ? Allergy   ? Arthritis   ? Atrial fibrillation ([place]) 07/06/2009  ? Atypical mole 09/13/1992  ? left shoulder Dr Cheryln Manly exc  ? Atypical nevi 10/08/1992  ? right abdomen Dr Francisca December  ? BACK PAIN 11/02/2009  ? BACK PAIN, LUMBAR 10/25/2010  ? Basal cell carcinoma 08/06/1992  ? right post thigh Dr Cheryln Manly  ? BCC (basal cell carcinoma of skin) 04/23/2018  ? left post shoulder tx with bx  ? BENIGN PROSTATIC HYPERTROPHY 07/25/2007  ? 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 CAD 01/22/2009  ? Cancer [facility])   ? skin  ? 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  ? Cataract   ? COLONIC POLYPS, HX OF 10/18/2007  ? Cough 10/25/2010  ? DIZZINESS 01/22/2009  ? DYSPNEA ON EXERTION 08/21/2008  ? ECHOCARDIOGRAM, ABNORMAL 08/19/2007  ? GERD 07/25/2007  ? Heart murmur   ? HERNIA 07/06/2009  ? HX, PERSONAL, MALIGNANCY, SKIN NEC 10/18/2007  ? HYPERGLYCEMIA, BORDERLINE 12/31/2007  ? HYPERLIPIDEMIA 07/25/2007  ? HYPERTENSION 07/23/2007  ? Inguinal hernia   ? LUMBAR RADICULOPATHY, RIGHT 10/18/2007  ? MUSCLE PAIN 01/22/2009  ? Nausea   ? NEURITIS 10/25/2010  ? Palpitations 05/09/2010  ? RHINITIS, CHRONIC 05/11/2010  ? RUQ PAIN 01/22/2009  ? Sinus polyp   ? UNS ADVRS EFF OTH RX MEDICINAL&BIOLOGICAL SBSTNC 04/15/2008  ? VERRUCA VULGARIS 12/31/2007  ? VITAMIN D DEFICIENCY 11/02/2009   ? Voice strain   ? ? ?Allergies  ?Allergen Reactions  ? Propoxyphene N-Acetaminophen Nausea And Vomiting  ? Iodinated Contrast Media Swelling  ?  Pt developed slight lt upper lip swelling only to one side about 15 minutes post IV contrast injection. No other symptoms.   ? ? ?ROS ?General: Denies fever, chills, night sweats, changes in weight, changes in appetite ?HEENT: Denies headaches, ear pain, changes in vision, rhinorrhea, sore throat ?CV: Denies CP, palpitations, SOB, orthopnea ?Pulm: Denies SOB, cough, wheezing ?GI: Denies abdominal pain, nausea, vomiting, diarrhea, constipation +GERD ?GU: Denies dysuria, hematuria, frequency ?Msk: Denies muscle cramps, joint pains ?Neuro: Denies weakness, numbness, tingling ?Skin: Denies rashes, bruising +packing in place s/p ID of infected cyst ?Psych: Denies depression, anxiety, hallucinations ?   ?Objective:  ?  ?Blood pressure 122/88, pulse 81, temperature 98.5 ?F (36.9 ?C), temperature source Oral, weight 201 lb 3.2 oz (91.3 kg), SpO2 95 %. ? ?Gen. Pleasant, well-nourished, in no distress, normal affect   ?HEENT: [place]/AT, face symmetric, conjunctiva clear, no scleral icterus, PERRLA, EOMI, nares patent without drainage ?Lungs: no accessory muscle use ?Cardiovascular: RRR, no peripheral edema ?Musculoskeletal: No deformities, no cyanosis or clubbing, normal tone ?Neuro:  A&Ox3, CN II-XII intact, ambulating slowly with cane ?Skin:  Warm, dry, intact.  Sterile packing removed from  L lower lateral back/flank at site of I&D for infected cyst and repacked with sterile packing.  Minimal serosanguinous fluid expressed, no purulent debris expressed from area.  Minimal ecchymosis of skin at site. ? ? ?Wt Readings from Last 3 Encounters:  ?05/05/22 201 lb 3.2 oz (91.3 kg)  ?05/03/22 202 lb (91.6 kg)  ?03/20/22 200 lb 12.8 oz (91.1 kg)  ? ? ?Lab Results  ?Component Value Date  ? WBC 9.6 08/09/2021  ? HGB 15.2 08/09/2021  ? HCT 44.7 08/09/2021  ? PLT 232 08/09/2021  ? GLUCOSE 118 (H)  08/09/2021  ? CHOL 180 10/13/2019  ? TRIG 126 10/13/2019  ? HDL 38 (L) 10/13/2019  ? LDLDIRECT 187.2 03/31/2008  ? LDLCALC 119 (H) 10/13/2019  ? ALT 7 08/09/2021  ? AST 18 08/09/2021  ? NA 136 08/09/2021  ? K 3.9 08/09/2021  ? CL 102 08/09/2021  ? CREATININE 0.72 08/09/2021  ? BUN 13 08/09/2021  ? CO2 24 08/09/2021  ? TSH 1.49 03/28/2021  ? PSA 1.49 02/21/2019  ? INR 1.1 (H) 01/25/2012  ? HGBA1C 6.0 03/28/2021  ? ? ?Assessment/Plan: ? ?Abscess packing removal ?-Consent obtained.  Sterile packing removed and replaced with new sterile packing.  Patient tolerated procedure well. ?-Skin healing nicely. ?-We will have patient return in 3 days for repacking. ?-Given precautions ? ?Infected cyst of skin ?-Status post I&D on 05/03/2022 of infected cyst of left lower back/flank ?-Healing nicely.  Repacked this visit. ?-Continue doxycycline twice daily x7 days ?-Continue to monitor for s/s of infection ? ?F/u in 3 days ? ?Grier Mitts, MD ?

## 2022-05-08 ENCOUNTER — Ambulatory Visit (INDEPENDENT_AMBULATORY_CARE_PROVIDER_SITE_OTHER): Payer: Medicare Other | Admitting: Family Medicine

## 2022-05-08 ENCOUNTER — Encounter: Payer: Self-pay | Admitting: Family Medicine

## 2022-05-08 VITALS — BP 120/78 | HR 73 | Temp 97.9°F | Wt 202.6 lb

## 2022-05-08 DIAGNOSIS — Z4800 Encounter for change or removal of nonsurgical wound dressing: Secondary | ICD-10-CM | POA: Diagnosis not present

## 2022-05-08 DIAGNOSIS — I259 Chronic ischemic heart disease, unspecified: Secondary | ICD-10-CM | POA: Diagnosis not present

## 2022-05-08 NOTE — Progress Notes (Signed)
Subjective:  ? ? Patient ID: [name], male    DOB: 08-11-1941, 81 y.o.   MRN: [id number] ? ?Chief Complaint  ?Patient presents with  ? Follow-up  ? Atrial Fibrillation  ?  cyst on back, states it is better and has started to itch.  ? ? ?HPI ?Patient is an 81 year old male with history of Parkinson's disease, A-fib, arthritis, skin cancer, CAD, GERD who was seen today for wound check and packing removal.  Patient seen 05/03/2022 for infected cyst removed via I&D, started on doxycycline.  Patient had packing placed that visit and on 05/05/2012.  Patient states area is without drainage or pain.  Denies fever, chills, nausea, vomiting.  States area is starting to itch. ? ?Past Medical History:  ?Diagnosis Date  ? Abdominal pain, right lateral 02/21/2012  ? progressive.    ? ALLERGIC RHINITIS 07/23/2007  ? Allergy   ? Arthritis   ? Atrial fibrillation ([place]) 07/06/2009  ? Atypical mole 09/13/1992  ? left shoulder Dr Cheryln Manly exc  ? Atypical nevi 10/08/1992  ? right abdomen Dr Francisca December  ? BACK PAIN 11/02/2009  ? BACK PAIN, LUMBAR 10/25/2010  ? Basal cell carcinoma 08/06/1992  ? right post thigh Dr Cheryln Manly  ? BCC (basal cell carcinoma of skin) 04/23/2018  ? left post shoulder tx with bx  ? BENIGN PROSTATIC HYPERTROPHY 07/25/2007  ? 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 CAD 01/22/2009  ? Cancer [facility])   ? skin  ? 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  ? Cataract   ? COLONIC POLYPS, HX OF 10/18/2007  ? Cough 10/25/2010  ? DIZZINESS 01/22/2009  ? DYSPNEA ON EXERTION 08/21/2008  ? ECHOCARDIOGRAM, ABNORMAL 08/19/2007  ? GERD 07/25/2007  ? Heart murmur   ? HERNIA 07/06/2009  ? HX, PERSONAL, MALIGNANCY, SKIN NEC 10/18/2007  ? HYPERGLYCEMIA, BORDERLINE 12/31/2007  ? HYPERLIPIDEMIA 07/25/2007  ? HYPERTENSION 07/23/2007  ? Inguinal hernia   ? LUMBAR RADICULOPATHY, RIGHT 10/18/2007  ? MUSCLE PAIN 01/22/2009  ? Nausea   ? NEURITIS 10/25/2010  ? Palpitations 05/09/2010  ? RHINITIS, CHRONIC 05/11/2010  ? RUQ PAIN 01/22/2009  ? Sinus polyp   ? UNS ADVRS  EFF OTH RX MEDICINAL&BIOLOGICAL SBSTNC 04/15/2008  ? VERRUCA VULGARIS 12/31/2007  ? VITAMIN D DEFICIENCY 11/02/2009  ? Voice strain   ? ? ?Allergies  ?Allergen Reactions  ? Propoxyphene N-Acetaminophen Nausea And Vomiting  ? Iodinated Contrast Media Swelling  ?  Pt developed slight lt upper lip swelling only to one side about 15 minutes post IV contrast injection. No other symptoms.   ? ? ?ROS ?General: Denies fever, chills, night sweats, changes in weight, changes in appetite ?HEENT: Denies headaches, ear pain, changes in vision, rhinorrhea, sore throat ?CV: Denies CP, palpitations, SOB, orthopnea ?Pulm: Denies SOB, cough, wheezing ?GI: Denies abdominal pain, nausea, vomiting, diarrhea, constipation ?GU: Denies dysuria, hematuria, frequency ?Msk: Denies muscle cramps, joint pains ?Neuro: Denies weakness, numbness, tingling ?Skin: Denies rashes, bruising +cyst packing ?Psych: Denies depression, anxiety, hallucinations ? ?   ?Objective:  ?  ?Blood pressure 120/78, pulse 73, temperature 97.9 ?F (36.6 ?C), temperature source Oral, weight 202 lb 9.6 oz (91.9 kg), SpO2 95 %. ? ?Gen. Pleasant, well-nourished, in no distress, normal affect   ?HEENT: [place]/AT, face symmetric, conjunctiva clear, no scleral icterus, PERRLA, EOMI, nares patent without drainage ?Lungs: no accessory muscle use ?Cardiovascular: RRR, no peripheral edema ?Musculoskeletal: Leaning forward.  No deformities, no cyanosis or clubbing, normal tone ?Neuro:  A&Ox3, CN II-XII intact, ambulating slowly with cane. ?Skin:  Warm, dry, intact.  Left lower lateral flank/back with sterile packing in place.  No erythema, increased warmth, induration noted around site of I&D.  Little to no purulent drainage noted on packing upon removal.  No purulent or serosanguineous drainage expressed from area of I&D.  Area repacked and dressed with sterile bandage. ? ? ?Wt Readings from Last 3 Encounters:  ?05/08/22 202 lb 9.6 oz (91.9 kg)  ?05/05/22 201 lb 3.2 oz (91.3 kg)  ?05/03/22  202 lb (91.6 kg)  ? ? ?Lab Results  ?Component Value Date  ? WBC 9.6 08/09/2021  ? HGB 15.2 08/09/2021  ? HCT 44.7 08/09/2021  ? PLT 232 08/09/2021  ? GLUCOSE 118 (H) 08/09/2021  ? CHOL 180 10/13/2019  ? TRIG 126 10/13/2019  ? HDL 38 (L) 10/13/2019  ? LDLDIRECT 187.2 03/31/2008  ? LDLCALC 119 (H) 10/13/2019  ? ALT 7 08/09/2021  ? AST 18 08/09/2021  ? NA 136 08/09/2021  ? K 3.9 08/09/2021  ? CL 102 08/09/2021  ? CREATININE 0.72 08/09/2021  ? BUN 13 08/09/2021  ? CO2 24 08/09/2021  ? TSH 1.49 03/28/2021  ? PSA 1.49 02/21/2019  ? INR 1.1 (H) 01/25/2012  ? HGBA1C 6.0 03/28/2021  ? ? ?Assessment/Plan: ? ?Abscess packing removal ?-Infected abscess status post I&D on 05/03/2022 healing nicely.  Consent obtained.  Packing removed and replaced.  Patient tolerated procedure well. ?-Continue doxycycline ?-pt to remove packing on Thursday 05/11/22. ? ?F/u prn ? ?Grier Mitts, MD ?

## 2022-05-08 NOTE — Patient Instructions (Signed)
Packing can be removed on Thursday, 05/11/2022. ?

## 2022-05-13 ENCOUNTER — Other Ambulatory Visit: Payer: Self-pay | Admitting: Internal Medicine

## 2022-05-23 ENCOUNTER — Ambulatory Visit (INDEPENDENT_AMBULATORY_CARE_PROVIDER_SITE_OTHER): Payer: Medicare Other | Admitting: Gastroenterology

## 2022-05-23 ENCOUNTER — Encounter: Payer: Self-pay | Admitting: Gastroenterology

## 2022-05-23 VITALS — BP 110/60 | HR 73 | Ht 67.5 in | Wt 199.5 lb

## 2022-05-23 DIAGNOSIS — I259 Chronic ischemic heart disease, unspecified: Secondary | ICD-10-CM | POA: Diagnosis not present

## 2022-05-23 DIAGNOSIS — K2100 Gastro-esophageal reflux disease with esophagitis, without bleeding: Secondary | ICD-10-CM

## 2022-05-23 MED ORDER — FAMOTIDINE 20 MG PO TABS: ORAL_TABLET | ORAL | Status: AC

## 2022-05-23 NOTE — Progress Notes (Signed)
Prescription he has bones crack review of pertinent gastrointestinal problems: 1.  Precancerous colon polyps.  Colonoscopy for 2018 Dr. Ardis Hughs for Cologuard positive stool found for subcentimeter tubular adenomas, also left-sided diverticulosis.  Recommended repeat colonoscopy at 3-year interval 2.  Right-sided abdominal pains led to EGD 03/2012 which showed mild nonspecific gastritis.  Pathology was negative for H. pylori. 3.  Chronic mild constipation improved with MiraLAX. 4.  Fairly typical GERD symptoms evaluation 02/2022: Switch to omeprazole 40 mg before breakfast and added famotidine 20 mg at bedtime every night 5.  Parkinson's.  L-dopa causes nausea   HPI: This is a very pleasant-year-old man  I last saw him 2 or 3 months ago.  We discussed his constipation which was well controlled on MiraLAX.  We also discussed some rather classic GERD symptoms and I made some medication adjustments, new prescription.  See above.  He started taking omeprazole 40 mg once daily and famotidine 20 mg at bedtime every night.  About a month later he started having a strange crackling sensation of the bones in his back.  He stopped both of those antiacid medicines and the crackling sensations seem to improve.  He restarted them both and started feeling bad again.  He did notice while he was on that antiacid regimen that his GERD symptoms were under very good control.  He still has nausea with his L-dopa  Walks with a cane.  He is in overall good spirits and humor about his slowly worsening Parkinson's  ROS: complete GI ROS as described in HPI, all other review negative.  Constitutional:  No unintentional weight loss   Past Medical History:  Diagnosis Date   Abdominal pain, right lateral 02/21/2012   progressive.     ALLERGIC RHINITIS 07/23/2007   Allergy    Arthritis    Atrial fibrillation ([place]) 07/06/2009   Atypical mole 09/13/1992   left shoulder Dr Cheryln Manly exc   Atypical nevi 10/08/1992    right abdomen Dr Jordan Hawks PAIN 11/02/2009   BACK PAIN, LUMBAR 10/25/2010   Basal cell carcinoma 08/06/1992   right post thigh Dr Cheryln Manly   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   CAD 01/22/2009   Cancer ([place])    skin   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   Cataract    COLONIC POLYPS, HX OF 10/18/2007   Cough 10/25/2010   DIZZINESS 01/22/2009   DYSPNEA ON EXERTION 08/21/2008   ECHOCARDIOGRAM, ABNORMAL 08/19/2007   GERD 07/25/2007   Heart murmur    HERNIA 07/06/2009   HX, PERSONAL, MALIGNANCY, SKIN NEC 10/18/2007   HYPERGLYCEMIA, BORDERLINE 12/31/2007   HYPERLIPIDEMIA 07/25/2007   HYPERTENSION 07/23/2007   Inguinal hernia    LUMBAR RADICULOPATHY, RIGHT 10/18/2007   MUSCLE PAIN 01/22/2009   Nausea    NEURITIS 10/25/2010   Palpitations 05/09/2010   RHINITIS, CHRONIC 05/11/2010   RUQ PAIN 01/22/2009   Sinus polyp    UNS ADVRS EFF OTH RX MEDICINAL&BIOLOGICAL SBSTNC 04/15/2008   VERRUCA VULGARIS 12/31/2007   VITAMIN D DEFICIENCY 11/02/2009   Voice strain     Past Surgical History:  Procedure Laterality Date   BACK SURGERY     basal cell and melanoma removed     face   BONY PELVIS SURGERY     CATARACT EXTRACTION     BIL   COLONOSCOPY     CORONARY ARTERY BYPASS GRAFT  2009   HERNIA REPAIR  7 times   inguinal   LAPAROSCOPIC CHOLECYSTECTOMY  03/03/2013   LUMBAR SPINE SURGERY     POLYPECTOMY     TONSILLECTOMY      Current Outpatient Medications  Medication Instructions   acetaminophen (TYLENOL) 500 MG tablet Oral   aspirin 81 MG chewable tablet  81 mg, = 1 tab, Oral, Daily, 30 tab, 0 Refill(s), Do Not Route   Carbidopa-Levodopa ER (SINEMET CR) 25-100 MG tablet controlled release 2 tablet at 9 AM/2 at 1 PM/1 at 5 PM   Cholecalciferol 125 MCG (5000 UT) capsule  125 mcg = 1 cap, Cap, Oral, Daily, 100 cap, 0 Refill(s), with food   clobetasol (OLUX) 0.05 % topical  foam APPLY to back and leg   CLOBETASOL PROPIONATE E 0.05 % emollient cream 1 application., Topical, 2 times daily   Coenzyme Q10 100 MG capsule  100 mg = 1 cap, Cap, Oral, Daily, 30 cap, 0 Refill(s)   Cyanocobalamin (B-12) 100 MCG TABS 1 tablet, Oral, Once every three days   DHA-EPA-Vitamin E (OMEGA-3 COMPLEX PO) 1,200 mg, Oral, 3 times daily, ProOmega LDL    famotidine (PEPCID) 20 mg, Oral, Daily at bedtime   magnesium oxide (MAG-OX) 400 mg, Oral, Daily   meclizine (ANTIVERT) 12.5-25 mg, Oral, 3 times daily PRN   metoprolol tartrate (LOPRESSOR) 50 MG tablet TAKE 1/2 TABLET BY MOUTH IN THE MORNING AND 1 TABLET BY MOUTH IN THE EVENING   MULTIPLE VITAMIN PO 1 tablet, Oral, Daily   Myrbetriq 25 mg, Oral, Daily   nitroGLYCERIN (NITROSTAT) 0.4 MG SL tablet PLACE 1 TABLET UNDER TONGUE EVERY 5 MINUTES AS NEEDED FOR CHEST PAIN   omeprazole (PRILOSEC) 40 mg, Oral, Daily before breakfast   ondansetron (ZOFRAN) 4 mg, Oral, Every 8 hours PRN   tamsulosin (FLOMAX) 0.4 MG CAPS capsule TAKE 1 CAPSULE BY MOUTH EVERY DAY   vitamin C (ASCORBIC ACID) 250 mg, Oral, Daily   Vitamin D3 5,000 Units, Oral, Daily    Allergies as of 05/23/2022 - Review Complete 05/23/2022  Allergen Reaction Noted   Propoxyphene n-acetaminophen Nausea And Vomiting 07/23/2007   Iodinated contrast media Swelling 02/23/2012    Family History  Problem Relation Age of Onset   Lung cancer Mother    Cancer Mother    Heart attack Father    Hypertension Father    Alcohol abuse Father    Heart disease Father    Breast cancer Sister    Cancer Sister        breast   Alcohol abuse Brother    Hypertension Brother    Alcohol abuse Brother    Hypertension Brother    Alcohol abuse Brother    Hypertension Brother    Kidney disease Sister    Colon cancer Neg Hx    Esophageal cancer Neg Hx    Rectal cancer Neg Hx    Stomach cancer Neg Hx     Social History   Socioeconomic History   Marital status: Married    Spouse name: Not  on file   Number of children: Not on file   Years of education: Not on file   Highest education level: Not on file  Occupational History   Not on file  Tobacco Use   Smoking status: Former    Types: Cigarettes    Quit date: 04/02/1964    Years since quitting: 58.1   Smokeless tobacco: Never   Tobacco comments:    in early 20's he quit  Vaping Use   Vaping Use: Never used  Substance and Sexual Activity   Alcohol use: Yes    Comment: socially   Drug use: No   Sexual activity: Not on file  Other Topics Concern   Not on file  Social History Narrative   Retired [place]   Married   Non smoker some etoh   Education officer, museum a lot            Social Determinants of Radio broadcast assistant Strain: Low Risk    Difficulty of Paying Living Expenses: Not hard at all  Food Insecurity: No Food Insecurity   Worried About Charity fundraiser in the Last Year: Never true   Arboriculturist in the Last Year: Never true  Transportation Needs: No Transportation Needs   Lack of Transportation (Medical): No   Lack of Transportation (Non-Medical): No  Physical Activity: Sufficiently Active   Days of Exercise per Week: 7 days   Minutes of Exercise per Session: 30 min  Stress: No Stress Concern Present   Feeling of Stress : Not at all  Social Connections: Socially Integrated   Frequency of Communication with Friends and Family: More than three times a week   Frequency of Social Gatherings with Friends and Family: More than three times a week   Attends Religious Services: More than 4 times per year   Active Member of [employer] or Organizations: Yes   Attends Music therapist: More than 4 times per year   Marital Status: Married  Human resources officer Violence: Not At Risk   Fear of Current or Ex-Partner: No   Emotionally Abused: No   Physically Abused: No   Sexually Abused: No     Physical Exam: Ht 5' 7.5" (1.715 m)   Wt 199 lb 8 oz (90.5 kg)   BMI 30.78 kg/m  Constitutional:  Frail, elderly, walks with a walker, very good spirits overall Psychiatric: alert and oriented x3 Abdomen: soft, nontender, nondistended, no obvious ascites, no peritoneal signs, normal bowel sounds No peripheral edema noted in lower extremities  Assessment and plan: 81 y.o. male with Parkinson's, GERD  I admitted to him that crackling bones is a very unusual side effect of either H2 blocker or proton pump inhibitor.  Off the medicines his typical GERD symptoms returned.  I recommended he not resume the omeprazole and instead start taking H2 blocker twice daily instead of once daily.  He will therefore be taking it 20 mg famotidine at bedtime and 20 mg famotidine sometime during the morning, hopefully with his breakfast meal.  He will return to see me in 3 months and sooner if needed.  Please see the "Patient Instructions" section for addition details about the plan.  Owens Loffler, MD [facility] 05/23/2022, 9:42 AM   Total time on date of encounter was 25 minutes (this included time spent preparing to see the patient reviewing records; obtaining and/or reviewing separately obtained history; performing a medically appropriate exam and/or evaluation; counseling and educating the patient and family if present; ordering medications, tests or procedures if applicable; and documenting clinical information in the health record).

## 2022-05-23 NOTE — Patient Instructions (Signed)
If you are age 83 or older, your body mass index should be between 23-30. Your Body mass index is 30.78 kg/m. If this is out of the aforementioned range listed, please consider follow up with your Primary Care Provider. ________________________________________________________  The [facility] providers would like to encourage you to use [facility] to communicate with providers for non-urgent requests or questions.  Due to long hold times on the telephone, sending your provider a message by [facility] may be a faster and more efficient way to get a response.  Please allow 48 business hours for a response.  Please remember that this is for non-urgent requests.  _______________________________________________________  DISCONTINUE: Omeprazole  Famodtine '20mg'$  one in the morning and one at bedtime.  You will need a follow up appointment in 3 months (August 2023).  We will contact you to schedule this appointment.  Thank you for entrusting me with your care and choosing [facility].  Dr Ardis Hughs

## 2022-06-13 ENCOUNTER — Telehealth: Payer: Self-pay | Admitting: Internal Medicine

## 2022-06-13 ENCOUNTER — Telehealth: Payer: Self-pay | Admitting: Neurology

## 2022-06-13 NOTE — Telephone Encounter (Signed)
Last Vv 08/18/21 Filled tamsulosin (FLOMAX) 05/15/22 metoprolol tartrate (LOPRESSOR) 01/30/22 Please advise

## 2022-06-13 NOTE — Telephone Encounter (Signed)
Patients wife understood and is calling the PCP to follow up on the dizziness and nausea

## 2022-06-13 NOTE — Telephone Encounter (Signed)
Ok to refill both for 90 days   Make in person appt  w me before runs out of the 90 days .

## 2022-06-13 NOTE — Telephone Encounter (Signed)
They are going to [place] and she needs a written prescription for his two medication. Carbidopa-levo ER 25-100 Ondansetron and meclizine.  He is getting dizzy and nauseous frequently. She wants to know if this is sinus or parkinson's. He feels like he is going to pass out and/or get sick.  Reason for the hard copy prescriptions is due to the pharmacy giving them a hard time. They told her she will need a hard copy.

## 2022-06-13 NOTE — Telephone Encounter (Signed)
Pt wife is calling and they will be going away in July and would like  written rx's for tamsulosin (FLOMAX) 0.4 MG CAPS capsule and metoprolol tartrate (LOPRESSOR) 50 MG tablet to be mail to home address

## 2022-06-14 ENCOUNTER — Encounter: Payer: Self-pay | Admitting: Family Medicine

## 2022-06-14 ENCOUNTER — Ambulatory Visit (INDEPENDENT_AMBULATORY_CARE_PROVIDER_SITE_OTHER): Payer: Medicare Other | Admitting: Family Medicine

## 2022-06-14 VITALS — BP 146/86 | HR 75 | Temp 98.1°F | Wt 200.0 lb

## 2022-06-14 DIAGNOSIS — I259 Chronic ischemic heart disease, unspecified: Secondary | ICD-10-CM

## 2022-06-14 DIAGNOSIS — G20 Parkinson's disease: Secondary | ICD-10-CM

## 2022-06-14 DIAGNOSIS — R42 Dizziness and giddiness: Secondary | ICD-10-CM

## 2022-06-14 DIAGNOSIS — I10 Essential (primary) hypertension: Secondary | ICD-10-CM

## 2022-06-14 DIAGNOSIS — R110 Nausea: Secondary | ICD-10-CM | POA: Diagnosis not present

## 2022-06-14 MED ORDER — METOPROLOL TARTRATE 50 MG PO TABS: ORAL_TABLET | ORAL | 0 refills | Status: DC

## 2022-06-14 MED ORDER — TAMSULOSIN HCL 0.4 MG PO CAPS: 0.4000 mg | ORAL_CAPSULE | Freq: Every day | ORAL | 0 refills | Status: DC

## 2022-06-14 NOTE — Addendum Note (Signed)
Addended by: Geradine Girt D on: 06/14/2022 12:22 PM   Modules accepted: Orders

## 2022-06-14 NOTE — Telephone Encounter (Signed)
Rx sent to the pharmacy.

## 2022-06-14 NOTE — Patient Instructions (Addendum)
It is important that you start drinking more water daily.  Start earlier in the day so that you are not up all night having to urinate.  Please take your medication today.

## 2022-06-14 NOTE — Progress Notes (Signed)
Subjective:    Patient ID: [name], male    DOB: 1941-03-17, 81 y.o.   MRN: [id number]  Chief Complaint  Patient presents with   Dizziness    On and off. Since in February   Nausea  Pt accompanied by his wife.  HPI Patient is an 81 yo male wihtwas seen today for ongoing concern.  Per pt's wife, pt has episodes of nausea then becomes dizzy after bending forward.  Last episode occurred yesterday while pt was leaning over in recliner.  Zofran and meclizine seem to help, but were unable to get a refill from neurology.  Episodes last a few minutes then resolved but pt panics when they occur.  Patient's wife concerned as they have an upcoming trip to [place].  Pt does not like drinking water.  Pt drinks coffee and soda.  Past Medical History:  Diagnosis Date   Abdominal pain, right lateral 02/21/2012   progressive.     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   CAD 01/22/2009   Cancer ([place])    skin   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   Cataract    COLONIC POLYPS, HX OF 10/18/2007   Cough 10/25/2010   DIZZINESS 01/22/2009   DYSPNEA ON EXERTION 08/21/2008   ECHOCARDIOGRAM, ABNORMAL 08/19/2007   GERD 07/25/2007   Heart murmur    HERNIA 07/06/2009   HX, PERSONAL, MALIGNANCY, SKIN NEC 10/18/2007   HYPERGLYCEMIA, BORDERLINE 12/31/2007   HYPERLIPIDEMIA 07/25/2007   HYPERTENSION 07/23/2007   Inguinal hernia    LUMBAR RADICULOPATHY, RIGHT 10/18/2007   MUSCLE PAIN 01/22/2009   Nausea    NEURITIS 10/25/2010   Palpitations 05/09/2010   RHINITIS,  CHRONIC 05/11/2010   RUQ PAIN 01/22/2009   Sinus polyp    UNS ADVRS EFF OTH RX MEDICINAL&BIOLOGICAL SBSTNC 04/15/2008   VERRUCA VULGARIS 12/31/2007   VITAMIN D DEFICIENCY 11/02/2009   Voice strain     Allergies  Allergen Reactions   Propoxyphene N-Acetaminophen Nausea And Vomiting   Iodinated Contrast Media Swelling    Pt developed slight lt upper lip swelling only to one side about 15 minutes post IV contrast injection. No other symptoms.     ROS General: Denies fever, chills, night sweats, changes in weight, changes in appetite  +dizziness HEENT: Denies headaches, ear pain, changes in vision, rhinorrhea, sore throat  CV: Denies CP, palpitations, SOB, orthopnea Pulm: Denies SOB, cough, wheezing GI: Denies abdominal pain, nausea, vomiting, diarrhea, constipation  +n GU: Denies dysuria, hematuria, frequency Msk: Denies muscle cramps, joint pains Neuro: Denies weakness, numbness, tingling Skin: Denies rashes, bruising Psych: Denies depression, anxiety, hallucinations    Objective:    Blood pressure (!) 146/86, pulse 75, temperature 98.1 F (36.7 C), temperature source Oral, weight 200 lb (90.7 kg), SpO2 94 %.  Gen. Pleasant, well-nourished, in no distress, normal affect   HEENT: [place]/AT, face symmetric, conjunctiva clear, no scleral icterus, PERRLA, EOMI, nares patent without drainage, TMs normal b/l. Lungs: no accessory muscle use, CTAB, no wheezes or  rales Cardiovascular: RRR, no m/r/g, no peripheral edema Musculoskeletal: No deformities, no cyanosis or clubbing, normal tone Neuro:  A&Ox3, CN II-XII intact, ambulating with cane Skin:  Warm, no lesions/ rash  Wt Readings from Last 3 Encounters:  06/14/22 200 lb (90.7 kg)  05/23/22 199 lb 8 oz (90.5 kg)  05/08/22 202 lb 9.6 oz (91.9 kg)    Lab Results  Component Value Date   WBC 9.6 08/09/2021   HGB 15.2 08/09/2021   HCT 44.7 08/09/2021   PLT 232 08/09/2021   GLUCOSE 118 (H) 08/09/2021   CHOL 180 10/13/2019   TRIG 126  10/13/2019   HDL 38 (L) 10/13/2019   LDLDIRECT 187.2 03/31/2008   LDLCALC 119 (H) 10/13/2019   ALT 7 08/09/2021   AST 18 08/09/2021   NA 136 08/09/2021   K 3.9 08/09/2021   CL 102 08/09/2021   CREATININE 0.72 08/09/2021   BUN 13 08/09/2021   CO2 24 08/09/2021   TSH 1.49 03/28/2021   PSA 1.49 02/21/2019   INR 1.1 (H) 01/25/2012   HGBA1C 6.0 03/28/2021    Assessment/Plan:  Essential hypertension -uncontrolled -pt advised to take medication when he gets home as he has yet to do so today -continue current meds -lifestyle modifications  Nausea -possibly 2/2 GERD symptoms -avoid foods known to cause problems -continue current meds -Discussed avoiding regular use of Meclizine and Zofran as previously advised per Neurology on chart review  Dizziness -likely 2/2 dehydration -pt encouraged to increase po intake   Parkinson's disease ([place]) -continue current meds -continue f/u with Neurology  F/u prn with pcp  Grier Mitts, MD

## 2022-06-18 DIAGNOSIS — M542 Cervicalgia: Secondary | ICD-10-CM | POA: Diagnosis not present

## 2022-06-26 ENCOUNTER — Telehealth: Payer: Self-pay | Admitting: Pharmacist

## 2022-06-26 NOTE — Chronic Care Management (AMB) (Signed)
    Chronic Care Management Pharmacy Assistant   Name: [name]  MRN: [id number] DOB: Jan 23, 1941 06/26/22 APPOINTMENT REMINDER  Call to patient to remind of phone call scheduled with Jeni Salles on 06/28/22, wife reports she attempted to cancel with the automated system, they will be travelling the next few weeks and she wanted to discuss with husband before committing to rescheduling, will call for appointment. Appointment has been cancelled.    Care Gaps: COVID Booster - Overdue BP- 146/86 06/14/22 AWV- 12/22 Lab Results  Component Value Date   HGBA1C 6.0 03/28/2021    Star Rating Drug: none     Medications: Outpatient Encounter Medications as of 06/26/2022  Medication Sig   acetaminophen (TYLENOL) 500 MG tablet Take by mouth.   aspirin 81 MG chewable tablet  81 mg, = 1 tab, Oral, Daily, 30 tab, 0 Refill(s), Do Not Route   Carbidopa-Levodopa ER (SINEMET CR) 25-100 MG tablet controlled release 2 tablet at 9 AM/2 at 1 PM/1 at 5 PM   Cholecalciferol (VITAMIN D3) 125 MCG (5000 UT) CAPS Take 1 capsule (5,000 Units total) by mouth daily.   Cholecalciferol 125 MCG (5000 UT) capsule  125 mcg = 1 cap, Cap, Oral, Daily, 100 cap, 0 Refill(s), with food   clobetasol (OLUX) 0.05 % topical foam APPLY to back and leg   CLOBETASOL PROPIONATE E 0.05 % emollient cream Apply 1 application topically 2 (two) times daily.   Coenzyme Q10 100 MG capsule  100 mg = 1 cap, Cap, Oral, Daily, 30 cap, 0 Refill(s)   Cyanocobalamin (B-12) 100 MCG TABS Take 1 tablet by mouth. Once every three days   DHA-EPA-Vitamin E (OMEGA-3 COMPLEX PO) Take 1,200 mg by mouth 3 (three) times daily. ProOmega LDL   famotidine (PEPCID) 20 MG tablet Take one tablet in the morning and one tablet at bedtime.   magnesium oxide (MAG-OX) 400 MG tablet Take 400 mg daily by mouth.   meclizine (ANTIVERT) 25 MG tablet Take 0.5-1 tablets (12.5-25 mg total) by mouth 3 (three) times daily as needed for dizziness.   metoprolol  tartrate (LOPRESSOR) 50 MG tablet TAKE 1/2 TABLET BY MOUTH IN THE MORNING AND 1 TABLET BY MOUTH IN THE EVENING   MULTIPLE VITAMIN PO Take 1 tablet by mouth daily.   MYRBETRIQ 25 MG TB24 tablet Take 25 mg by mouth daily.   nitroGLYCERIN (NITROSTAT) 0.4 MG SL tablet PLACE 1 TABLET UNDER TONGUE EVERY 5 MINUTES AS NEEDED FOR CHEST PAIN (Patient not taking: Reported on 06/14/2022)   ondansetron (ZOFRAN) 4 MG tablet Take 1 tablet (4 mg total) by mouth every 8 (eight) hours as needed for nausea or vomiting.   tamsulosin (FLOMAX) 0.4 MG CAPS capsule Take 1 capsule (0.4 mg total) by mouth daily.   vitamin C (ASCORBIC ACID) 250 MG tablet Take 250 mg by mouth daily.   No facility-administered encounter medications on file as of 06/26/2022.      [place] Clinical Pharmacist Assistant [phone]

## 2022-06-28 ENCOUNTER — Telehealth: Payer: Medicare Other

## 2022-06-28 NOTE — Progress Notes (Signed)
CARDIOLOGY OFFICE NOTE  Date:  07/11/2022    [name] Date of Birth: Dec 26, 1940 Medical Record #[id number]  PCP:  [name], MD  Cardiologist:   Johnsie Cancel   History of Present Illness: [name] is a 81 y.o. male who presents today for f/u .  He has seen neurology March 2023  for vertigo/dizziness  and parkinsons with recent increase in his Parkinson's meds    He has a history of known CAD with prior CABG in 2009. Other issues include HLD, BPH and HTN. He had repeat cath back in 2013 with patent grafts noted. Lesion in a small unbypassed OM to be managed medically.  He has had muscle aches/pains/weakness with statin therapy. He has been on altered doses of Lipitor in the past with poor tolerance reported.    He is on low dose beta blocker for palpitations as he has first degree AV block  He has had chronic back pain. He is not active. More limited by his back. Has not tolerated statins or Zetia in the past. Balance has gotten poor.    He underwent back surgery at [facility] in June 2021  - with Dr. Owens Shark at [place] - needed some medicine adjustments after he got back home due to concerns for BP/HR. ARB stopped   Has had some dizzy spells over the last 6 months seem clearly related to change in Parkinso's meds and postural. Notes nausea then dizzy  after bending forward Zofran and Meclizine help symptoms last a few minutes   Monitor reviewed 02/23/22 benign NSR PVC;s one auto trigger for 7 second run slow VT 144 bpm no symptoms Sees [facility] for constipation improved with MiraLAX   ***  Past Medical History:  Diagnosis Date   Abdominal pain, right lateral 02/21/2012   progressive.     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   CAD 01/22/2009   Cancer ([place])    skin   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   Cataract    COLONIC POLYPS, HX OF 10/18/2007   Cough 10/25/2010   DIZZINESS 01/22/2009   DYSPNEA ON EXERTION 08/21/2008   ECHOCARDIOGRAM, ABNORMAL 08/19/2007   GERD 07/25/2007   Heart murmur    HERNIA 07/06/2009   HX, PERSONAL, MALIGNANCY, SKIN NEC 10/18/2007   HYPERGLYCEMIA, BORDERLINE 12/31/2007   HYPERLIPIDEMIA 07/25/2007   HYPERTENSION 07/23/2007   Inguinal hernia    LUMBAR RADICULOPATHY, RIGHT 10/18/2007   MUSCLE PAIN 01/22/2009   Nausea    NEURITIS 10/25/2010   Palpitations 05/09/2010   RHINITIS, CHRONIC 05/11/2010   RUQ PAIN 01/22/2009   Sinus polyp    UNS ADVRS EFF OTH RX MEDICINAL&BIOLOGICAL SBSTNC 04/15/2008   VERRUCA VULGARIS 12/31/2007   VITAMIN D DEFICIENCY 11/02/2009   Voice strain     Past Surgical History:  Procedure Laterality Date   BACK SURGERY     basal cell and melanoma removed     face   BONY PELVIS SURGERY  CATARACT EXTRACTION     BIL   COLONOSCOPY     CORONARY ARTERY BYPASS GRAFT  2009   HERNIA REPAIR  7 times   inguinal   LAPAROSCOPIC CHOLECYSTECTOMY  03/03/2013   LUMBAR SPINE SURGERY     POLYPECTOMY     TONSILLECTOMY       Medications: Current Meds  Medication Sig   acetaminophen (TYLENOL) 500 MG tablet Take by mouth.   aspirin 81 MG chewable tablet  81 mg, = 1 tab, Oral, Daily, 30 tab, 0 Refill(s), Do Not Route   Carbidopa-Levodopa ER (SINEMET CR) 25-100 MG tablet controlled release 2 tablet at 9 AM/2 at 1 PM/1 at 5 PM   Cholecalciferol (VITAMIN D3) 125 MCG (5000 UT) CAPS Take 1 capsule (5,000 Units total) by mouth daily.   clobetasol (OLUX) 0.05 % topical foam APPLY to back and leg   CLOBETASOL PROPIONATE E 0.05 % emollient cream Apply 1 application topically  2 (two) times daily.   Coenzyme Q10 100 MG capsule  100 mg = 1 cap, Cap, Oral, Daily, 30 cap, 0 Refill(s)   Cyanocobalamin (B-12) 100 MCG TABS Take 1 tablet by mouth. Once every three days   DHA-EPA-Vitamin E (OMEGA-3 COMPLEX PO) Take 1,200 mg by mouth daily at 6 (six) AM. ProOmega LDL   famotidine (PEPCID) 20 MG tablet Take one tablet in the morning and one tablet at bedtime.   magnesium oxide (MAG-OX) 400 MG tablet Take 400 mg daily by mouth.   meclizine (ANTIVERT) 25 MG tablet Take 0.5-1 tablets (12.5-25 mg total) by mouth 3 (three) times daily as needed for dizziness.   metoprolol tartrate (LOPRESSOR) 50 MG tablet TAKE 1/2 TABLET BY MOUTH IN THE MORNING AND 1 TABLET BY MOUTH IN THE EVENING   MULTIPLE VITAMIN PO Take 1 tablet by mouth daily.   MYRBETRIQ 25 MG TB24 tablet Take 25 mg by mouth daily.   nitroGLYCERIN (NITROSTAT) 0.4 MG SL tablet PLACE 1 TABLET UNDER TONGUE EVERY 5 MINUTES AS NEEDED FOR CHEST PAIN   ondansetron (ZOFRAN) 4 MG tablet Take 1 tablet (4 mg total) by mouth every 8 (eight) hours as needed for nausea or vomiting.   tamsulosin (FLOMAX) 0.4 MG CAPS capsule Take 1 capsule (0.4 mg total) by mouth daily.   vitamin C (ASCORBIC ACID) 250 MG tablet Take 250 mg by mouth daily.   [DISCONTINUED] Cholecalciferol 125 MCG (5000 UT) capsule  125 mcg = 1 cap, Cap, Oral, Daily, 100 cap, 0 Refill(s), with food     Allergies: Allergies  Allergen Reactions   Propoxyphene N-Acetaminophen Nausea And Vomiting   Iodinated Contrast Media Swelling    Pt developed slight lt upper lip swelling only to one side about 15 minutes post IV contrast injection. No other symptoms.     Social History: The patient  reports that he quit smoking about 58 years ago. His smoking use included cigarettes. He has never used smokeless tobacco. He reports current alcohol use. He reports that he does not use drugs.   Family History: The patient's family history includes Alcohol abuse in his brother, brother,  brother, and father; Breast cancer in his sister; Cancer in his mother and sister; Heart attack in his father; Heart disease in his father; Hypertension in his brother, brother, brother, and father; Kidney disease in his sister; Lung cancer in his mother.   Review of Systems: Please see the history of present illness.   All other systems are reviewed and negative.   Physical Exam: VS:  There were no vitals taken for this visit. Marland Kitchen  BMI There is no height or weight on file to calculate BMI.  Wt Readings from Last 3 Encounters:  06/14/22 200 lb (90.7 kg)  05/23/22 199 lb 8 oz (90.5 kg)  05/08/22 202 lb 9.6 oz (91.9 kg)    General: Pleasant. Alert. Elderly. He is in no acute distress.   Cardiac: Regular rate and rhythm. No murmurs, rubs, or gallops. No edema.  Respiratory:  Lungs are clear to auscultation bilaterally with normal work of breathing.  GI: Soft and nontender.  MS: No deformity or atrophy. Gait and ROM intact. Using a care.  Skin: Warm and dry. Color is normal.  Neuro:  Strength and sensation are intact and no gross focal deficits noted.  Psych: Alert, appropriate and with normal affect.   LABORATORY DATA:  EKG:  SR rate 71 possible old IMI   Lab Results  Component Value Date   WBC 9.6 08/09/2021   HGB 15.2 08/09/2021   HCT 44.7 08/09/2021   PLT 232 08/09/2021   GLUCOSE 118 (H) 08/09/2021   CHOL 180 10/13/2019   TRIG 126 10/13/2019   HDL 38 (L) 10/13/2019   LDLDIRECT 187.2 03/31/2008   LDLCALC 119 (H) 10/13/2019   ALT 7 08/09/2021   AST 18 08/09/2021   NA 136 08/09/2021   K 3.9 08/09/2021   CL 102 08/09/2021   CREATININE 0.72 08/09/2021   BUN 13 08/09/2021   CO2 24 08/09/2021   TSH 1.49 03/28/2021   PSA 1.49 02/21/2019   INR 1.1 (H) 01/25/2012   HGBA1C 6.0 03/28/2021     BNP (last 3 results) No results for input(s): "BNP" in the last 8760 hours.  ProBNP (last 3 results) No results for input(s): "PROBNP" in the last 8760 hours.    Other Studies  Reviewed Today:  ECHO IMPRESSIONS 09/2019    1. Left ventricular ejection fraction, by visual estimation, is 65 to 70%. The left ventricle has normal function. Normal left ventricular size. There is mildly increased left ventricular hypertrophy.  2. Left ventricular diastolic Doppler parameters are consistent with impaired relaxation pattern of LV diastolic filling.  3. Global right ventricle has normal systolic function.The right ventricular size is mildly enlarged. No increase in right ventricular wall thickness.  4. Left atrial size was mildly dilated.  5. Right atrial size was normal.  6. Mild aortic valve annular calcification.  7. The mitral valve is grossly normal. Trace mitral valve regurgitation. No evidence of mitral stenosis.  8. The tricuspid valve is normal in structure. Tricuspid valve regurgitation was not visualized by color flow Doppler.  9. The aortic valve is normal in structure. Aortic valve regurgitation was not visualized by color flow Doppler. Mild aortic valve sclerosis without stenosis. 10. There is Mild calcification of the aortic valve. 11. There is Mild thickening of the aortic valve. 12. The pulmonic valve was normal in structure. Pulmonic valve regurgitation is trivial by color flow Doppler. 13. The inferior vena cava is normal in size with greater than 50% respiratory variability, suggesting right atrial pressure of 3 mmHg.   In comparison to the previous echocardiogram(s): 11/13/17 EF 60-65%.       Holter Study Highlights 10/2017   NSR PAC;s / PVC;s No significant arrhythmias PVC;s account for less than 1% total beats      Echo Study Conclusions 10/2017   - Left ventricle: The cavity size was normal. There was moderate   concentric hypertrophy. Systolic function was normal. The  estimated ejection fraction was in the range of 60% to 65%. Wall   motion was normal; there were no regional wall motion   abnormalities. Doppler parameters are consistent  with abnormal   left ventricular relaxation (grade 1 diastolic dysfunction).   Doppler parameters are consistent with elevated ventricular   end-diastolic filling pressure. - Aortic valve: Trileaflet; normal thickness leaflets. There was no   regurgitation. - Aortic root: The aortic root was normal in size. - Mitral valve: There was mild regurgitation. - Left atrium: The atrium was moderately dilated. - Right ventricle: Systolic function was normal. - Right atrium: The atrium was normal in size. - Tricuspid valve: There was trivial regurgitation. - Pulmonary arteries: Systolic pressure was within the normal   range. - Inferior vena cava: The vessel was normal in size. The   respirophasic diameter changes were in the normal range (= 50%),   consistent with normal central venous pressure. - Pericardium, extracardiac: There was no pericardial effusion.       ASSESSMENT & PLAN:     1. CAD - remote CABG - he has no exertional symptoms - would favor conservative management.   2. HTN - BP looks fine - will stay on current regimen - remains off his ARB. Some permissive HTN given age, parkinson's and dizzy spells   3. HLD - does not tolerate therapy and does not wish to try anything else.   4. Spinal stenosis - prior back surgery - not sure how much this has really helped him.   5. 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 Aortic sclerosis:  no change in murmur  7. Dizzy Spells:  monitor 02/2022 benign likely related to neurologic issues and change in parkinson's meds and nausea with L dopa     Disposition:   FU with [place] in a year    Patient is agreeable to this plan and will call if any problems develop in the interim.   Signed: Jenkins Rouge, MD  07/11/2022 10:12 AM  [facility] [address] [place], [place]  [address] Phone: [phone] Fax: [phone]

## 2022-07-11 ENCOUNTER — Ambulatory Visit (INDEPENDENT_AMBULATORY_CARE_PROVIDER_SITE_OTHER): Payer: Medicare Other | Admitting: Cardiovascular Disease

## 2022-07-11 ENCOUNTER — Encounter: Payer: Self-pay | Admitting: Cardiovascular Disease

## 2022-07-11 VITALS — BP 140/80 | HR 71 | Ht 67.5 in | Wt 198.0 lb

## 2022-07-11 DIAGNOSIS — I358 Other nonrheumatic aortic valve disorders: Secondary | ICD-10-CM

## 2022-07-11 DIAGNOSIS — I25810 Atherosclerosis of coronary artery bypass graft(s) without angina pectoris: Secondary | ICD-10-CM

## 2022-07-11 DIAGNOSIS — E7849 Other hyperlipidemia: Secondary | ICD-10-CM

## 2022-07-11 DIAGNOSIS — I259 Chronic ischemic heart disease, unspecified: Secondary | ICD-10-CM

## 2022-07-11 DIAGNOSIS — I440 Atrioventricular block, first degree: Secondary | ICD-10-CM | POA: Diagnosis not present

## 2022-07-11 DIAGNOSIS — I10 Essential (primary) hypertension: Secondary | ICD-10-CM

## 2022-07-11 MED ORDER — NITROGLYCERIN 0.4 MG SL SUBL: SUBLINGUAL_TABLET | SUBLINGUAL | 3 refills | Status: AC

## 2022-07-11 NOTE — Patient Instructions (Addendum)

## 2022-07-12 DIAGNOSIS — Z961 Presence of intraocular lens: Secondary | ICD-10-CM | POA: Diagnosis not present

## 2022-07-12 DIAGNOSIS — H524 Presbyopia: Secondary | ICD-10-CM | POA: Diagnosis not present

## 2022-07-12 DIAGNOSIS — H04123 Dry eye syndrome of bilateral lacrimal glands: Secondary | ICD-10-CM | POA: Diagnosis not present

## 2022-07-12 DIAGNOSIS — H35373 Puckering of macula, bilateral: Secondary | ICD-10-CM | POA: Diagnosis not present

## 2022-07-25 NOTE — Progress Notes (Unsigned)
Assessment/Plan:   1.  Parkinsons Disease,   although atypical state like PSP cannot be r/o given diplopia, anterocollis and urinary issues early (likely due to prostate issues)  -Discussed skin biopsies.  I would like to go ahead and proceed with that.  Inferiorly, if he had an atypical state like PSP the skin biopsy would be negative, but it would be positive in something like Parkinson's disease.  -Patient has reported consistent nausea with levodopa.  He has now been on the immediate release and extended release version.  I am actually going to have him hold levodopa for about a week and see how he does.  Following that, I am going to have him trial Rytary 195, 1 capsule 3 times per day.             -Discussed extensively how levodopa interacts with protein.             -MBE in May, 2022 was normal.  -PT recommended but declined.   2.  Vertigo             -hx of bppv.  When he gets nauseated, he takes meclizine with Zofran, and finds he has improvement, which is why I wonder if it is all really levodopa.  I do think levodopa is a contributing factor, which is why I want to hold it for a week and see if we can help differentiate things.   3.  Nausea             -As above, I think that this is multifactorial.  He has pretty significant GERD, for which she is seeing GI.  He has vertigo, which is causing nausea.  In addition, levodopa is likely contributing as well.   4.  Near syncope  -wonder if related to metoprolol, although BP good/high today  -discussed concept of permissive HTN with Parkinsons Disease  -he is to f/u with Dr. Johnsie Cancel  5.  Constipation  -discussed nature and pathophysiology and association with PD  -discussed importance of hydration.  Pt is to increase water intake  -pt is given a copy of the rancho recipe  -recommended daily colace  -recommended miralax prn      Subjective:   [name] was seen today in follow-up.  Last visit, we significantly  increased the patient's levodopa from 3 tablets/day to 5 tablets/day.  He reports today that ***.  Last visit, he was having some nausea, but I was not convinced it was all related to levodopa, as he would have some in the morning upon awakening, before he could even take levodopa.  I told him he had to follow back up with Dr. Ardis Hughs which she did and he was told that he had "fairly typical GERD symptoms."  He was changed to omeprazole and famotidine was added at bedtime.  Patient did tell him that the levodopa caused him to be nauseated.  He did want additional prescriptions for Zofran from [place], but I told him that we do not prescribe long-term Zofran for Parkinsons patients.  He did see Dr. Volanda Napoleon on June 21 regarding nausea and dizziness.  He becomes near syncopal (has had syncope in the past).  He does not like to drink water.  Current movement disorder medication: Carbidopa/levodopa 25/100 CR, 2 tablet at 9 AM/2 at 1 PM/1 at 5 PM  Prior meds: Carbidopa/levodopa 25/100 IR (stopped in case was contributing to nausea)  ALLERGIES:   Allergies  Allergen Reactions   Propoxyphene  N-Acetaminophen Nausea And Vomiting   Iodinated Contrast Media Swelling    Pt developed slight lt upper lip swelling only to one side about 15 minutes post IV contrast injection. No other symptoms.     CURRENT MEDICATIONS:  Current Outpatient Medications  Medication Instructions   acetaminophen (TYLENOL) 500 MG tablet Oral   aspirin 81 MG chewable tablet  81 mg, = 1 tab, Oral, Daily, 30 tab, 0 Refill(s), Do Not Route   Carbidopa-Levodopa ER (SINEMET CR) 25-100 MG tablet controlled release 2 tablet at 9 AM/2 at 1 PM/1 at 5 PM   clobetasol (OLUX) 0.05 % topical foam APPLY to back and leg   CLOBETASOL PROPIONATE E 0.05 % emollient cream 1 application , Topical, 2 times daily   Coenzyme Q10 100 MG capsule  100 mg = 1 cap, Cap, Oral, Daily, 30 cap, 0 Refill(s)   Cyanocobalamin (B-12) 100 MCG TABS 1 tablet, Oral, Once every  three days   DHA-EPA-Vitamin E (OMEGA-3 COMPLEX PO) 1,200 mg, Oral, Daily, ProOmega LDL    famotidine (PEPCID) 20 MG tablet Take one tablet in the morning and one tablet at bedtime.   magnesium oxide (MAG-OX) 400 mg, Oral, Daily   meclizine (ANTIVERT) 12.5-25 mg, Oral, 3 times daily PRN   metoprolol tartrate (LOPRESSOR) 50 MG tablet TAKE 1/2 TABLET BY MOUTH IN THE MORNING AND 1 TABLET BY MOUTH IN THE EVENING   MULTIPLE VITAMIN PO 1 tablet, Oral, Daily   Myrbetriq 25 mg, Oral, Daily   nitroGLYCERIN (NITROSTAT) 0.4 MG SL tablet Take 1 tablet, under your tongue, while sitting for chest pain  If no relief of pain may repeat NTG, one tab every 5 minutes up to 3 tablets total over 15 minutes.  If no relief CALL 911.   ondansetron (ZOFRAN) 4 mg, Oral, Every 8 hours PRN   tamsulosin (FLOMAX) 0.4 mg, Oral, Daily   vitamin C (ASCORBIC ACID) 250 mg, Oral, Daily   Vitamin D3 5,000 Units, Oral, Daily    Objective:   VITALS:   There were no vitals filed for this visit.    GEN:  The patient appears stated age and is in NAD. HEENT:  Normocephalic, atraumatic.  The mucous membranes are moist. The superficial temporal arteries are without ropiness or tenderness. CV:  RRR Lungs:  CTAB Neck/HEME:  There are no carotid bruits bilaterally.  Neck is forward flexed.    Neurological examination:  Orientation: The patient is alert and oriented x3.  Cranial nerves: There is good facial symmetry. There is facial hypomimia.  Extraocular muscles are intact. The visual fields are full to confrontational testing. The speech is fluent and clear. He is hypophonic.  Soft palate rises symmetrically and there is no tongue deviation. Hearing is intact to conversational tone. Sensation: Sensation is intact to light and pinprick throughout (facial, trunk, extremities). Vibration is intact at the bilateral big toe. There is no extinction with double simultaneous stimulation. There is no sensory dermatomal level  identified. Motor: Strength is 5/5 in the bilateral upper and lower extremities.   Shoulder shrug is equal and symmetric.  There is no pronator drift. Deep tendon reflexes: Deep tendon reflexes are 2/4 at the bilateral biceps, triceps, brachioradialis, patella and achilles. Plantar responses are downgoing bilaterally.  Movement examination: Tone: There is mild increased tone in the bilateral UE, R>L Abnormal movements: none Coordination:  There is decremation with RAM's, with any form of RAMS, including alternating supination and pronation of the forearm, hand opening and closing, finger taps, heel  taps and toe taps, R>L Gait and Station: The patient has difficulty arising out of a deep-seated chair without the use of the hands. The patient's stride length is decreased.  He is flexed at the waist.  Slightly drags the R leg.   I have reviewed and interpreted the following labs independently   Chemistry      Component Value Date/Time   NA 136 08/09/2021 2000   NA 141 07/14/2020 1407   K 3.9 08/09/2021 2000   CL 102 08/09/2021 2000   CO2 24 08/09/2021 2000   BUN 13 08/09/2021 2000   BUN 15 07/14/2020 1407   CREATININE 0.72 08/09/2021 2000      Component Value Date/Time   CALCIUM 9.1 08/09/2021 2000   ALKPHOS 66 08/09/2021 2000   AST 18 08/09/2021 2000   ALT 7 08/09/2021 2000   BILITOT 0.9 08/09/2021 2000   BILITOT 0.6 10/13/2019 1523      Lab Results  Component Value Date   TSH 1.49 03/28/2021   Lab Results  Component Value Date   WBC 9.6 08/09/2021   HGB 15.2 08/09/2021   HCT 44.7 08/09/2021   MCV 94.3 08/09/2021   PLT 232 08/09/2021     Total time spent on today's visit was *** minutes, including both face-to-face time and nonface-to-face time.  Time included that spent on review of records (prior notes available to me/labs/imaging if pertinent), discussing treatment and goals, answering patient's questions and coordinating care.  Cc:  Panosh, Standley Brooking, MD

## 2022-07-26 ENCOUNTER — Telehealth: Payer: Self-pay | Admitting: Internal Medicine

## 2022-07-26 NOTE — Telephone Encounter (Signed)
Pt's wife called to inform MD that Pt passed out last night, EMS was called, vitals were good, Pt cleared, may have dehydration.    Pt has an appointment tomorrow with (Neuro) Dr. Carles Collet.  They just wanted to keep MD in the loop.

## 2022-07-27 ENCOUNTER — Ambulatory Visit (INDEPENDENT_AMBULATORY_CARE_PROVIDER_SITE_OTHER): Payer: Medicare Other | Admitting: Neurology

## 2022-07-27 ENCOUNTER — Encounter: Payer: Self-pay | Admitting: Neurology

## 2022-07-27 VITALS — BP 130/72 | HR 64 | Ht 67.5 in | Wt 197.4 lb

## 2022-07-27 DIAGNOSIS — R110 Nausea: Secondary | ICD-10-CM | POA: Diagnosis not present

## 2022-07-27 DIAGNOSIS — G20 Parkinson's disease: Secondary | ICD-10-CM

## 2022-07-27 DIAGNOSIS — I259 Chronic ischemic heart disease, unspecified: Secondary | ICD-10-CM | POA: Diagnosis not present

## 2022-07-27 DIAGNOSIS — R55 Syncope and collapse: Secondary | ICD-10-CM

## 2022-07-27 MED ORDER — RYTARY 48.75-195 MG PO CPCR
ORAL_CAPSULE | ORAL | Status: AC
Start: 2022-07-27 — End: ?

## 2022-07-27 NOTE — Progress Notes (Signed)
Medication Samples have been provided to the patient.  Drug name: rytary       Strength: 195        Qty: 2 bottles  LOT: [id number]  Exp.Date: 8/23  Dosing instructions: take as directed   The patient has been instructed regarding the correct time, dose, and frequency of taking this medication, including desired effects and most common side effects.

## 2022-07-27 NOTE — Patient Instructions (Signed)
Hold carbidopa/levodopa CR Take rytary 195 mg, 1 at 9am/1pm/5pm

## 2022-07-31 ENCOUNTER — Other Ambulatory Visit (HOSPITAL_COMMUNITY): Payer: Self-pay

## 2022-07-31 ENCOUNTER — Telehealth: Payer: Self-pay | Admitting: Neurology

## 2022-07-31 ENCOUNTER — Other Ambulatory Visit: Payer: Self-pay

## 2022-07-31 DIAGNOSIS — G20 Parkinson's disease: Secondary | ICD-10-CM

## 2022-07-31 MED ORDER — RYTARY 48.75-195 MG PO CPCR: ORAL_CAPSULE | ORAL | 0 refills | Status: AC

## 2022-07-31 NOTE — Telephone Encounter (Signed)
Patients wife has called again about the samples she has a ride today and wanted to see if she can get them today

## 2022-07-31 NOTE — Telephone Encounter (Signed)
Pt's wife called in and left a message. She stated the pt liked the Rytary and has had good results for the last 4 days. She stated Dr. Carles Collet said they could come in and pick up samples if they liked the medication.

## 2022-08-01 ENCOUNTER — Telehealth: Payer: Self-pay

## 2022-08-01 NOTE — Telephone Encounter (Signed)
Sent into PA dept for PA This is the response  Just FYI for patients Rytary  ----- Message -----  From: Lenon Oms, CPhT  Sent: 07/31/2022   3:04 PM EDT  To: Patricia Nettle, CMA   PA is not needed for this this medication for him.  His copay for 30 day supply is $30.00  ----- Message -----  From: Patricia Nettle, CMA  Sent: 07/31/2022  12:31 PM EDT  To: Rx Prior Auth Team   I need to start a Pa for this patient. He has been receiving samples for Rytart 195 mg and we would like to see what his copay would be before he runs out

## 2022-08-01 NOTE — Telephone Encounter (Signed)
Thanks for the update

## 2022-08-18 ENCOUNTER — Ambulatory Visit (INDEPENDENT_AMBULATORY_CARE_PROVIDER_SITE_OTHER): Payer: Medicare Other | Admitting: Neurology

## 2022-08-18 DIAGNOSIS — G20 Parkinson's disease: Secondary | ICD-10-CM | POA: Diagnosis not present

## 2022-08-18 DIAGNOSIS — R258 Other abnormal involuntary movements: Secondary | ICD-10-CM

## 2022-08-18 MED ORDER — LIDOCAINE-EPINEPHRINE 1 %-1:100000 IJ SOLN: 4.0000 mL | Freq: Once | INTRAMUSCULAR | Status: AC

## 2022-08-18 NOTE — Procedures (Signed)
Punch Biopsy Procedure Note  Preprocedure Diagnosis: Bradykinesia; Tremor;   Postprocedure Diagnosis: same  Locations: Site 1: left, posterior shoulder;  Site 2: above, left knee;  Site 3: above, left foot  Indications: r/o alpha synucleinopathy  Anesthesia: 3 mL Lidocaine 1% without epinephrine without added sodium bicarbonate  Procedure Details Patient informed of the risks (including but not limited to bleeding, pain, infection, scar and infection) and benefits of the procedure.  Informed consent obtained.  The areas which were chosen for biopsy, as above, and surrounding areas were given a sterile prep using betadyne and draped in the usual sterile fashion. The skin was then stretched perpendicular to the skin tension lines and sample removed using the 3 mm punch. Pressure applied, hemostasis achieved.   Dressing applied. The specimen(s) was sent for pathologic examination. The patient tolerated the procedure well.  Estimated Blood Loss: 0 ml  Condition: Stable  Complications: none.  Plan: 1. Instructed to keep the wound dry and covered for 24-48h and clean thereafter. 2. Warning signs of infection were reviewed.

## 2022-08-18 NOTE — Addendum Note (Signed)
Addended by: Armen Pickup A on: 08/18/2022 03:59 PM   Modules accepted: Orders

## 2022-08-21 ENCOUNTER — Ambulatory Visit: Payer: Medicare Other | Admitting: Gastroenterology

## 2022-08-22 ENCOUNTER — Ambulatory Visit: Payer: Medicare Other | Admitting: Gastroenterology

## 2022-08-22 ENCOUNTER — Telehealth: Payer: Self-pay | Admitting: Neurology

## 2022-08-22 NOTE — Telephone Encounter (Signed)
Called patients wife and asked if patient can come in at 10:45 am so that Dr. Carles Collet can take a look at the Biopsy site. Patients wife said yes. Informed patients wife that I will let front staff know and Dr. Carles Collet.

## 2022-08-22 NOTE — Progress Notes (Unsigned)
Assessment/Plan:   1.  Parkinsons Disease,   although atypical state like PSP cannot be r/o given diplopia, anterocollis and urinary issues early (likely due to prostate issues)  -Skin biopsy is pending.  -Patient tolerating Rytary 195 mg, 1 capsule 3 times per day.  He is doing much better with this medication than he has other levodopa formulations.             -Discussed extensively how levodopa interacts with protein.             -MBE in May, 2022 was normal.  -PT recommended but declined again today.   2.  Vertigo             -hx of bppv.    3.  syncope  -recent episode doesn't really sound orthostatic as he has it while seated;  wonder if he is experiencing arrhthymia.  Will let Dr. Johnsie Cancel know as well.  -the episodes he has when he bends over and feels near syncopal could be related to orthostasis.  Discussed importance of hydration.  He admits he really is not drinking water.  Wife does nagged him about this.        Subjective:   [name] was seen today in follow-up.  Patient had skin biopsy done and wife concerned about area on his leg.  Separately, he was started on Rytary, 195 mg, 1 capsule 3 times per day and is tolerating this much better.  Current movement disorder medication: Rytary 195, 1 capsule 3 times per day  Prior meds: Carbidopa/levodopa 25/100 IR (stopped in case was contributing to nausea)  ALLERGIES:   Allergies  Allergen Reactions   Propoxyphene N-Acetaminophen Nausea And Vomiting   Iodinated Contrast Media Swelling    Pt developed slight lt upper lip swelling only to one side about 15 minutes post IV contrast injection. No other symptoms.     CURRENT MEDICATIONS:  Current Outpatient Medications  Medication Instructions   acetaminophen (TYLENOL) 500 MG tablet Oral   aspirin 81 MG chewable tablet  81 mg, = 1 tab, Oral, Daily, 30 tab, 0 Refill(s), Do Not Route   Carbidopa-Levodopa ER (RYTARY) 48.75-195 MG CPCR 1 at 9am/noon/5pm    Carbidopa-Levodopa ER (RYTARY) 48.75-195 MG CPCR Samples of this drug were given to the patient, quantity 4, Lot Number [id number] A Exp 07/2022   clobetasol (OLUX) 0.05 % topical foam APPLY to back and leg   CLOBETASOL PROPIONATE E 0.05 % emollient cream 1 application , Topical, 2 times daily   Coenzyme Q10 100 MG capsule  100 mg = 1 cap, Cap, Oral, Daily, 30 cap, 0 Refill(s)   Cyanocobalamin (B-12) 100 MCG TABS 1 tablet, Oral, Once every three days   DHA-EPA-Vitamin E (OMEGA-3 COMPLEX PO) 1,200 mg, Oral, Daily, ProOmega LDL    famotidine (PEPCID) 20 MG tablet Take one tablet in the morning and one tablet at bedtime.   magnesium oxide (MAG-OX) 400 mg, Oral, Daily   meclizine (ANTIVERT) 12.5-25 mg, Oral, 3 times daily PRN   metoprolol tartrate (LOPRESSOR) 50 MG tablet TAKE 1/2 TABLET BY MOUTH IN THE MORNING AND 1 TABLET BY MOUTH IN THE EVENING   MULTIPLE VITAMIN PO 1 tablet, Oral, Daily   Myrbetriq 25 mg, Oral, Daily   nitroGLYCERIN (NITROSTAT) 0.4 MG SL tablet Take 1 tablet, under your tongue, while sitting for chest pain  If no relief of pain may repeat NTG, one tab every 5 minutes up to 3 tablets total over 15 minutes.  If no relief CALL 911.   ondansetron (ZOFRAN) 4 mg, Oral, Every 8 hours PRN   tamsulosin (FLOMAX) 0.4 mg, Oral, Daily   vitamin C (ASCORBIC ACID) 250 mg, Oral, Daily   Vitamin D3 5,000 Units, Oral, Daily    Objective:   VITALS:   There were no vitals filed for this visit.     GEN:  The patient appears stated age and is in NAD. HEENT:  Normocephalic, atraumatic.  The mucous membranes are moist.   Neurological examination:  Orientation: The patient is alert and oriented x3.  Cranial nerves: There is good facial symmetry. There is facial hypomimia.  Extraocular muscles are intact. The visual fields are full to confrontational testing. The speech is fluent and clear. He is just slightly hypophonic.  Soft palate rises symmetrically and there is no tongue deviation.  Hearing is intact to conversational tone. Motor: Strength is at least antigravity x4  Movement examination: Not tested today.  Most of the visit was spent in counseling. I have reviewed and interpreted the following labs independently   Chemistry      Component Value Date/Time   NA 136 08/09/2021 2000   NA 141 07/14/2020 1407   K 3.9 08/09/2021 2000   CL 102 08/09/2021 2000   CO2 24 08/09/2021 2000   BUN 13 08/09/2021 2000   BUN 15 07/14/2020 1407   CREATININE 0.72 08/09/2021 2000      Component Value Date/Time   CALCIUM 9.1 08/09/2021 2000   ALKPHOS 66 08/09/2021 2000   AST 18 08/09/2021 2000   ALT 7 08/09/2021 2000   BILITOT 0.9 08/09/2021 2000   BILITOT 0.6 10/13/2019 1523      Lab Results  Component Value Date   TSH 1.49 03/28/2021   Lab Results  Component Value Date   WBC 9.6 08/09/2021   HGB 15.2 08/09/2021   HCT 44.7 08/09/2021   MCV 94.3 08/09/2021   PLT 232 08/09/2021     Total time spent on today's visit was *** minutes, including both face-to-face time and nonface-to-face time.  Time included that spent on review of records (prior notes available to me/labs/imaging if pertinent), discussing treatment and goals, answering patient's questions and coordinating care.  Cc:  Panosh, Standley Brooking, MD

## 2022-08-22 NOTE — Telephone Encounter (Signed)
I got patient on the schedule for 10:45 on 08-23-22

## 2022-08-22 NOTE — Telephone Encounter (Signed)
Pt's wife called in stating the pt had a skin biopsy last week and the biopsy site on his thigh is painful and still oozing. She is worried about it.

## 2022-08-23 ENCOUNTER — Ambulatory Visit (INDEPENDENT_AMBULATORY_CARE_PROVIDER_SITE_OTHER): Payer: Medicare Other | Admitting: Neurology

## 2022-08-23 ENCOUNTER — Encounter: Payer: Self-pay | Admitting: Neurology

## 2022-08-23 VITALS — BP 129/77 | HR 75 | Temp 98.3°F | Ht 67.5 in | Wt 193.0 lb

## 2022-08-23 DIAGNOSIS — I259 Chronic ischemic heart disease, unspecified: Secondary | ICD-10-CM

## 2022-08-23 DIAGNOSIS — L089 Local infection of the skin and subcutaneous tissue, unspecified: Secondary | ICD-10-CM | POA: Diagnosis not present

## 2022-08-23 DIAGNOSIS — T148XXA Other injury of unspecified body region, initial encounter: Secondary | ICD-10-CM | POA: Diagnosis not present

## 2022-08-23 MED ORDER — CEPHALEXIN 500 MG PO CAPS: 500.0000 mg | ORAL_CAPSULE | Freq: Three times a day (TID) | ORAL | 0 refills | Status: AC

## 2022-08-23 NOTE — Patient Instructions (Signed)
Start keflex 500 mg three times per day.  The physicians and staff at [facility] are committed to providing excellent care. You may receive a survey requesting feedback about your experience at our office. We strive to receive "very good" responses to the survey questions. If you feel that your experience would prevent you from giving the office a "very good " response, please contact our office to try to remedy the situation. We may be reached at [phone]. Thank you for taking the time out of your busy day to complete the survey.

## 2022-08-24 ENCOUNTER — Ambulatory Visit: Payer: Medicare Other | Admitting: Neurology

## 2022-08-29 ENCOUNTER — Telehealth: Payer: Self-pay | Admitting: Neurology

## 2022-08-29 NOTE — Telephone Encounter (Signed)
Called and spoke to patients wife and she wanted to let Dr. Carles Collet know that patients wound is not as red and has went down and does not hurt as much as it did. However, the opening is still large and it is still oozing a darker brown color.  I informed patient that I will send this message to Dr. Carles Collet and call her back if Dr. Carles Collet recommendations change.

## 2022-08-29 NOTE — Telephone Encounter (Signed)
Patient wife called and left a VM and would like a call back about patient wound  on the patient leg. Please call

## 2022-08-30 NOTE — Telephone Encounter (Signed)
Patient's wife called to say the wound has opened more and has puss. She'd like someone to see him for this.

## 2022-08-30 NOTE — Telephone Encounter (Signed)
Called and spoke to patients wife and informed her that patient will need to contact his PCP for treatment of his wound. Patients wife verbalized understanding and will do that.

## 2022-08-31 ENCOUNTER — Encounter: Payer: Self-pay | Admitting: Family Medicine

## 2022-08-31 ENCOUNTER — Ambulatory Visit (INDEPENDENT_AMBULATORY_CARE_PROVIDER_SITE_OTHER): Payer: Medicare Other | Admitting: Family Medicine

## 2022-08-31 VITALS — BP 122/68 | HR 75 | Temp 98.2°F | Wt 194.0 lb

## 2022-08-31 DIAGNOSIS — I259 Chronic ischemic heart disease, unspecified: Secondary | ICD-10-CM | POA: Diagnosis not present

## 2022-08-31 DIAGNOSIS — L089 Local infection of the skin and subcutaneous tissue, unspecified: Secondary | ICD-10-CM

## 2022-08-31 DIAGNOSIS — Z9889 Other specified postprocedural states: Secondary | ICD-10-CM | POA: Diagnosis not present

## 2022-08-31 DIAGNOSIS — T148XXA Other injury of unspecified body region, initial encounter: Secondary | ICD-10-CM

## 2022-08-31 MED ORDER — DOXYCYCLINE HYCLATE 100 MG PO TABS: 100.0000 mg | ORAL_TABLET | Freq: Two times a day (BID) | ORAL | 0 refills | Status: AC

## 2022-08-31 NOTE — Progress Notes (Signed)
Subjective:    Patient ID: [name], male    DOB: 27-Oct-1941, 81 y.o.   MRN: [id number]  Chief Complaint  Patient presents with   Leg Injury    2 wks ago had biopsy, was given abx, finished it and Dr Hall Busing wanted him to get it looked at. Had 3 tests done the other 2 ( in neck and left ankle) have healed but left thigh above the knee has not. States it hurts and itches at the same time, is still draining. Wound is still open. When places bandage on it, it will get soaked. Dr Hall Busing suggested to keep it dry.     HPI Patient was seen today for a concern.  Patient endorses having skin biopsies x 3 with dermatology 2 weeks ago.  Bxp on left lateral distal thigh has become infected.  Had some erythema and drainage from area.  Pt contacted Dr. Hall Busing who rx'd Keflex 3 times daily, but area still draining.  Patient denies fever, chills, nausea, vomiting.  States area of erythema is becoming smaller.  Other biopsy sites healing without issue.  Past Medical History:  Diagnosis Date   Abdominal pain, right lateral 02/21/2012   progressive.     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   Cataract    COLONIC POLYPS, HX OF 10/18/2007   Cough 10/25/2010   DIZZINESS 01/22/2009   DYSPNEA ON EXERTION 08/21/2008   ECHOCARDIOGRAM, ABNORMAL 08/19/2007   GERD 07/25/2007   Heart murmur    HERNIA 07/06/2009   HX, PERSONAL, MALIGNANCY, SKIN NEC 10/18/2007   HYPERGLYCEMIA, BORDERLINE 12/31/2007    HYPERLIPIDEMIA 07/25/2007   HYPERTENSION 07/23/2007   Inguinal hernia    LUMBAR RADICULOPATHY, RIGHT 10/18/2007   MUSCLE PAIN 01/22/2009   Nausea    NEURITIS 10/25/2010   Palpitations 05/09/2010   RHINITIS, CHRONIC 05/11/2010   RUQ PAIN 01/22/2009   Sinus polyp    UNS ADVRS EFF OTH RX MEDICINAL&BIOLOGICAL SBSTNC 04/15/2008   VERRUCA VULGARIS 12/31/2007   VITAMIN D DEFICIENCY 11/02/2009   Voice strain     Allergies  Allergen Reactions   Propoxyphene N-Acetaminophen Nausea And Vomiting   Iodinated Contrast Media Swelling    Pt developed slight lt upper lip swelling only to one side about 15 minutes post IV contrast injection. No other symptoms.     ROS General: Denies fever, chills, night sweats, changes in weight, changes in appetite HEENT: Denies headaches, ear pain, changes in vision, rhinorrhea, sore throat CV: Denies CP, palpitations, SOB, orthopnea Pulm: Denies SOB, cough, wheezing GI: Denies abdominal pain, nausea, vomiting, diarrhea, constipation GU: Denies dysuria, hematuria, frequency, vaginal discharge Msk: Denies muscle cramps, joint pains Neuro: Denies weakness, numbness, tingling Skin: Denies rashes, bruising + wound infection Psych: Denies depression, anxiety, hallucinations    Objective:    Blood pressure 122/68, pulse 75, temperature 98.2 F (36.8 C), temperature source Oral, weight 194 lb (  88 kg), SpO2 94 %.  Gen. Pleasant, well-nourished, in no distress, normal affect   HEENT: [place]/AT, face symmetric, conjunctiva clear, no scleral icterus, PERRLA, EOMI, nares patent without drainage Lungs: no accessory muscle use, CTAB, no wheezes or rales Cardiovascular: RRR, no m/r/g, no peripheral edema Neuro:  A&Ox3, CN II-XII intact, slowed gait ambulating with cane Skin:  Warm, dry, intact.  Left lateral distal thigh with open wound from punch biopsy, minimal erythema surrounding, granulation tissue within wound, scant purulent drainage on Band-Aid.  Mild TTP surrounding site.   Area of erythema demarcated with skin marker.   Wt Readings from Last 3 Encounters:  08/31/22 194 lb (88 kg)  08/23/22 193 lb (87.5 kg)  07/27/22 197 lb 6.4 oz (89.5 kg)    Lab Results  Component Value Date   WBC 9.6 08/09/2021   HGB 15.2 08/09/2021   HCT 44.7 08/09/2021   PLT 232 08/09/2021   GLUCOSE 118 (H) 08/09/2021   CHOL 180 10/13/2019   TRIG 126 10/13/2019   HDL 38 (L) 10/13/2019   LDLDIRECT 187.2 03/31/2008   LDLCALC 119 (H) 10/13/2019   ALT 7 08/09/2021   AST 18 08/09/2021   NA 136 08/09/2021   K 3.9 08/09/2021   CL 102 08/09/2021   CREATININE 0.72 08/09/2021   BUN 13 08/09/2021   CO2 24 08/09/2021   TSH 1.49 03/28/2021   PSA 1.49 02/21/2019   INR 1.1 (H) 01/25/2012   HGBA1C 6.0 03/28/2021    Assessment/Plan:  Infected wound  - Plan: doxycycline (VIBRA-TABS) 100 MG tablet  S/P skin biopsy -Punch biopsy by Derm  Concern for wound infection s/p punch biopsy of lateral distal L thigh despite course of Keflex 3 times daily.  Start doxycycline twice daily x7 days.  Patient advised to keep area clean and dry.  Area of erythema demarcated with skin marker.  Discussed S/S of infection.  Patient advised to let wound get air when at home.  Advised area likely to take longer to heal given location/frequent movement of leg.  Continue probiotic daily.  Given strict precautions.  Patient to notify clinic if develops diarrhea.  Given handout.  Follow-up with Derm.  F/u as needed  Grier Mitts, MD

## 2022-09-04 ENCOUNTER — Other Ambulatory Visit: Payer: Self-pay

## 2022-09-04 ENCOUNTER — Emergency Department (HOSPITAL_COMMUNITY)
Admission: EM | Admit: 2022-09-04 | Discharge: 2022-09-04 | Disposition: A | Discharge status: Home / Self Care | Payer: Medicare Other | Attending: Emergency Medicine | Admitting: Emergency Medicine

## 2022-09-04 ENCOUNTER — Encounter (HOSPITAL_COMMUNITY): Payer: Self-pay

## 2022-09-04 ENCOUNTER — Emergency Department (HOSPITAL_COMMUNITY): Payer: Medicare Other

## 2022-09-04 DIAGNOSIS — I10 Essential (primary) hypertension: Secondary | ICD-10-CM | POA: Insufficient documentation

## 2022-09-04 DIAGNOSIS — S81802A Unspecified open wound, left lower leg, initial encounter: Secondary | ICD-10-CM | POA: Insufficient documentation

## 2022-09-04 DIAGNOSIS — R109 Unspecified abdominal pain: Secondary | ICD-10-CM | POA: Diagnosis not present

## 2022-09-04 DIAGNOSIS — Z79899 Other long term (current) drug therapy: Secondary | ICD-10-CM | POA: Diagnosis not present

## 2022-09-04 DIAGNOSIS — I2510 Atherosclerotic heart disease of native coronary artery without angina pectoris: Secondary | ICD-10-CM | POA: Diagnosis not present

## 2022-09-04 DIAGNOSIS — R739 Hyperglycemia, unspecified: Secondary | ICD-10-CM | POA: Diagnosis not present

## 2022-09-04 DIAGNOSIS — S8992XA Unspecified injury of left lower leg, initial encounter: Secondary | ICD-10-CM | POA: Diagnosis present

## 2022-09-04 DIAGNOSIS — Z7982 Long term (current) use of aspirin: Secondary | ICD-10-CM | POA: Diagnosis not present

## 2022-09-04 DIAGNOSIS — G20 Parkinson's disease: Secondary | ICD-10-CM | POA: Insufficient documentation

## 2022-09-04 DIAGNOSIS — R404 Transient alteration of awareness: Secondary | ICD-10-CM | POA: Diagnosis not present

## 2022-09-04 DIAGNOSIS — R29818 Other symptoms and signs involving the nervous system: Secondary | ICD-10-CM | POA: Diagnosis not present

## 2022-09-04 DIAGNOSIS — X58XXXA Exposure to other specified factors, initial encounter: Secondary | ICD-10-CM | POA: Diagnosis not present

## 2022-09-04 DIAGNOSIS — I672 Cerebral atherosclerosis: Secondary | ICD-10-CM | POA: Diagnosis not present

## 2022-09-04 DIAGNOSIS — Z951 Presence of aortocoronary bypass graft: Secondary | ICD-10-CM | POA: Insufficient documentation

## 2022-09-04 DIAGNOSIS — Z743 Need for continuous supervision: Secondary | ICD-10-CM | POA: Diagnosis not present

## 2022-09-04 LAB — CBC WITH DIFFERENTIAL/PLATELET
Abs Immature Granulocytes: 0.02 10*3/uL (ref 0.00–0.07)
Basophils Absolute: 0.1 10*3/uL (ref 0.0–0.1)
Basophils Relative: 1 %
Eosinophils Absolute: 0.1 10*3/uL (ref 0.0–0.5)
Eosinophils Relative: 2 %
HCT: 47.4 % (ref 39.0–52.0)
Hemoglobin: 16.4 g/dL (ref 13.0–17.0)
Immature Granulocytes: 0 %
Lymphocytes Relative: 16 %
Lymphs Abs: 1.3 10*3/uL (ref 0.7–4.0)
MCH: 32.5 pg (ref 26.0–34.0)
MCHC: 34.6 g/dL (ref 30.0–36.0)
MCV: 94 fL (ref 80.0–100.0)
Monocytes Absolute: 1.2 10*3/uL — ABNORMAL HIGH (ref 0.1–1.0)
Monocytes Relative: 15 %
Neutro Abs: 5.3 10*3/uL (ref 1.7–7.7)
Neutrophils Relative %: 66 %
Platelets: 273 10*3/uL (ref 150–400)
RBC: 5.04 MIL/uL (ref 4.22–5.81)
RDW: 12.9 % (ref 11.5–15.5)
WBC: 7.9 10*3/uL (ref 4.0–10.5)
nRBC: 0 % (ref 0.0–0.2)

## 2022-09-04 LAB — COMPREHENSIVE METABOLIC PANEL
ALT: 9 U/L (ref 0–44)
AST: 17 U/L (ref 15–41)
Albumin: 4.1 g/dL (ref 3.5–5.0)
Alkaline Phosphatase: 65 U/L (ref 38–126)
Anion gap: 6 (ref 5–15)
BUN: 16 mg/dL (ref 8–23)
CO2: 27 mmol/L (ref 22–32)
Calcium: 9.6 mg/dL (ref 8.9–10.3)
Chloride: 111 mmol/L (ref 98–111)
Creatinine, Ser: 0.77 mg/dL (ref 0.61–1.24)
GFR, Estimated: >60 mL/min (ref >60)
Glucose, Bld: 123 mg/dL — ABNORMAL HIGH (ref 70–99)
Potassium: 3.9 mmol/L (ref 3.5–5.1)
Sodium: 144 mmol/L (ref 135–145)
Total Bilirubin: 0.8 mg/dL (ref 0.3–1.2)
Total Protein: 7.1 g/dL (ref 6.5–8.1)

## 2022-09-04 LAB — URINALYSIS, ROUTINE W REFLEX MICROSCOPIC
Bilirubin Urine: NEGATIVE
Glucose, UA: NEGATIVE mg/dL
Hgb urine dipstick: NEGATIVE
Ketones, ur: 5 mg/dL — AB
Leukocytes,Ua: NEGATIVE
Nitrite: NEGATIVE
Protein, ur: NEGATIVE mg/dL
Specific Gravity, Urine: 1.013 (ref 1.005–1.030)
pH: 5 (ref 5.0–8.0)

## 2022-09-04 MED ORDER — SODIUM CHLORIDE 0.9 % IV BOLUS
1000.0000 mL | Freq: Once | INTRAVENOUS | Status: AC
  Administered 2022-09-04: 1000 mL via INTRAVENOUS

## 2022-09-04 NOTE — ED Triage Notes (Signed)
Per EMS, patient from home, infection to wound on L lower leg. Currently taking doxy for infection. Recently completed additional abx for same. Site red per EMS. Hx parkinsons. Additionally, patient c/o abdominal pain that he cannot relate to antibiotic use. Patient also c/o head pressure and "feels empty" in his chest. A&O. Ambulatory with assistance.  BP 134/90 HR 70 97% RA

## 2022-09-04 NOTE — ED Provider Notes (Signed)
[facility] DEPT Provider Note   CSN: [id number] Arrival date & time: 09/04/22  1256     History  No chief complaint on file.   [name] is a 81 y.o. male with past medical history significant for hyperlipidemia, hypertension, CAD, previous history of CABG, Parkinsons who is not currently on anticoagulation who presents with multiple concerns.  First patient with concern for infection/poor wound healing on the left upper leg, after dermatologic biopsy 2 weeks ago.  He had started and not finish taking 2 different courses of antibiotics, most recently Doxy secondary to stomach upset.  His wife reports that it is improving with topical bacitracin.  Additionally patient with 2 isolated episodes, first was 1 month ago, and second was this Friday of feeling out of it, staring into space, with amnesia of the event, without tonic-clonic activity, postictal state.  Patient with no previous history of intracranial bleeding, no previous history of seizures.  At this time patient reports that he feels some fullness on the top of the head but denies any headache, vision changes, double vision, weakness, numbness, slurred speech, facial droop.  HPI     Home Medications Prior to Admission medications   Medication Sig Start Date End Date Taking? Authorizing Provider  acetaminophen (TYLENOL) 500 MG tablet Take by mouth.    [provider]  aspirin 81 MG chewable tablet  81 mg, = 1 tab, Oral, Daily, 30 tab, 0 Refill(s), Do Not Route 01/15/21   [provider]  Carbidopa-Levodopa ER (RYTARY) 48.75-195 MG CPCR 1 at 9am/noon/5pm 07/27/22   [name], [name], DO  Carbidopa-Levodopa ER (RYTARY) 48.75-195 MG CPCR Samples of this drug were given to the patient, quantity 4, Lot Number [id number] Exp 07/2022 07/31/22   [name], [name], DO  cephALEXin (KEFLEX) 500 MG capsule Take 1 capsule (500 mg total) by mouth 3 (three) times daily. 08/23/22   [name], [name], DO   Cholecalciferol (VITAMIN D3) 125 MCG (5000 UT) CAPS Take 1 capsule (5,000 Units total) by mouth daily. 05/20/21   Josue Hector, MD  clobetasol (OLUX) 0.05 % topical foam APPLY to back and leg 12/29/21   Sheffield, Vida Roller R, PA-C  CLOBETASOL PROPIONATE E 0.05 % emollient cream Apply 1 application topically 2 (two) times daily. 12/29/21   Warren Danes, PA-C  Coenzyme Q10 100 MG capsule  100 mg = 1 cap, Cap, Oral, Daily, 30 cap, 0 Refill(s) 01/07/21   [provider]  Cyanocobalamin (B-12) 100 MCG TABS Take 1 tablet by mouth. Once every three days    [provider]  DHA-EPA-Vitamin E (OMEGA-3 COMPLEX PO) Take 1,200 mg by mouth daily at 6 (six) AM. ProOmega LDL    [provider]  doxycycline (VIBRA-TABS) 100 MG tablet Take 1 tablet (100 mg total) by mouth 2 (two) times daily for 7 days. 08/31/22 09/07/22  Billie Ruddy, MD  famotidine (PEPCID) 20 MG tablet Take one tablet in the morning and one tablet at bedtime. 05/23/22   Milus Banister, MD  magnesium oxide (MAG-OX) 400 MG tablet Take 400 mg daily by mouth.    [provider]  meclizine (ANTIVERT) 25 MG tablet Take 0.5-1 tablets (12.5-25 mg total) by mouth 3 (three) times daily as needed for dizziness. 08/09/21   Margarita Mail, PA-C  metoprolol tartrate (LOPRESSOR) 50 MG tablet TAKE 1/2 TABLET BY MOUTH IN THE MORNING AND 1 TABLET BY MOUTH IN THE EVENING 06/14/22   Panosh, Standley Brooking, MD  MULTIPLE VITAMIN PO Take 1 tablet by mouth daily.    [provider]  MYRBETRIQ 25 MG TB24 tablet Take 25 mg by mouth daily. 06/10/16   [provider]  nitroGLYCERIN (NITROSTAT) 0.4 MG SL tablet Take 1 tablet, under your tongue, while sitting for chest pain  If no relief of pain may repeat NTG, one tab every 5 minutes up to 3 tablets total over 15 minutes.  If no relief CALL 911. 07/11/22   Josue Hector, MD  ondansetron (ZOFRAN) 4 MG tablet Take 1 tablet (4 mg total) by mouth every 8 (eight) hours as needed for  nausea or vomiting. 02/17/22   [name], [name], DO  tamsulosin (FLOMAX) 0.4 MG CAPS capsule Take 1 capsule (0.4 mg total) by mouth daily. 06/14/22   Panosh, Standley Brooking, MD  vitamin C (ASCORBIC ACID) 250 MG tablet Take 250 mg by mouth daily.    [provider]      Allergies    Propoxyphene n-acetaminophen and Iodinated contrast media    Review of Systems   Review of Systems  Skin:  Positive for wound.  All other systems reviewed and are negative.   Physical Exam Updated Vital Signs BP (!) 159/90   Pulse 78   Temp 98.4 F (36.9 C) (Oral)   Resp 13   Ht 5' 7.5" (1.715 m)   Wt 88.5 kg   SpO2 93%   BMI 30.09 kg/m  Physical Exam Vitals and nursing note reviewed.  Constitutional:      General: He is not in acute distress.    Appearance: Normal appearance.  HENT:     Head: Normocephalic and atraumatic.  Eyes:     General:        Right eye: No discharge.        Left eye: No discharge.  Cardiovascular:     Rate and Rhythm: Normal rate and regular rhythm.     Heart sounds: No murmur heard.    No friction rub. No gallop.  Pulmonary:     Effort: Pulmonary effort is normal.     Breath sounds: Normal breath sounds.  Abdominal:     General: Bowel sounds are normal.     Palpations: Abdomen is soft.  Skin:    General: Skin is warm and dry.     Capillary Refill: Capillary refill takes less than 2 seconds.     Comments: Patient with a round biopsy site on the left upper leg with approximately 2 mm of surrounding redness without fluctuance, or fluid collection or abscess formation noted.  No signs of tracking redness, or significant tenderness to palpation.  Neurological:     Mental Status: He is alert and oriented to person, place, and time.     Comments: Cranial nerves II through XII grossly intact.  Intact finger-nose, intact heel-to-shin.  Romberg negative, gait normal.  Alert and oriented x3.  Moves all 4 limbs spontaneously, normal coordination.  No pronator drift.  Intact  strength 5 out of 5 bilateral upper and lower extremities.    Psychiatric:        Mood and Affect: Mood normal.        Behavior: Behavior normal.     ED Results / Procedures / Treatments   Labs (all labs ordered are listed, but only abnormal results are displayed) Labs Reviewed  URINALYSIS, ROUTINE W REFLEX MICROSCOPIC - Abnormal; Notable for the following components:      Result Value   Ketones, ur 5 (*)  All other components within normal limits  CBC WITH DIFFERENTIAL/PLATELET - Abnormal; Notable for the following components:   Monocytes Absolute 1.2 (*)    All other components within normal limits  COMPREHENSIVE METABOLIC PANEL - Abnormal; Notable for the following components:   Glucose, Bld 123 (*)    All other components within normal limits    EKG EKG Interpretation  Date/Time:  Monday September 04 2022 16:13:40 EDT Ventricular Rate:  76 PR Interval:  234 QRS Duration: 95 QT Interval:  411 QTC Calculation: 463 R Axis:   22 Text Interpretation: Sinus rhythm Ventricular premature complex Prolonged PR interval Low voltage, precordial leads Probable anteroseptal infarct, old Confirmed by Dene Gentry [phone]) on 09/04/2022 4:21:13 PM  Radiology CT Head Wo Contrast  Result Date: 09/04/2022 CLINICAL DATA:  Neuro deficit, acute, stroke suspected. Head pressure. EXAM: CT HEAD WITHOUT CONTRAST TECHNIQUE: Contiguous axial images were obtained from the base of the skull through the vertex without intravenous contrast. RADIATION DOSE REDUCTION: This exam was performed according to the departmental dose-optimization program which includes automated exposure control, adjustment of the mA and/or kV according to patient size and/or use of iterative reconstruction technique. COMPARISON:  Head CT 01/07/2009 FINDINGS: Brain: There is no evidence of an acute infarct, intracranial hemorrhage, mass, midline shift, or extra-axial fluid collection. The ventricles and sulci are within normal  limits for age. Periventricular white matter hypodensities are nonspecific but compatible with minimal chronic small vessel ischemic disease, also within normal limits for age. Vascular: Calcified atherosclerosis at the skull base. No hyperdense vessel. Skull: No fracture or suspicious osseous lesion. Sinuses/Orbits: Visualized paranasal sinuses and mastoid air cells are clear. Bilateral cataract extraction. Other: None. IMPRESSION: No evidence of acute intracranial abnormality. Electronically Signed   By: Logan Bores M.D.   On: 09/04/2022 16:15    Procedures Procedures    Medications Ordered in ED Medications  sodium chloride 0.9 % bolus 1,000 mL (1,000 mLs Intravenous New Bag/Given 09/04/22 1600)    ED Course/ Medical Decision Making/ A&P                           Medical Decision Making Amount and/or Complexity of Data Reviewed Labs: ordered. Radiology: ordered.   This patient is a 81 y.o. male who presents to the ED for concern of 2 major concerns today, patient with ongoing left leg wound, and intolerance to current antibiotic regimen, as well as patient has had 2 isolated episodes of transient loss of awareness/consciousness without postictal or other seizure-like activity noted, this involves an extensive number of treatment options, and is a complaint that carries with it a high risk of complications and morbidity. The emergent differential diagnosis prior to evaluation includes, but is not limited to, atypical, or partial complex seizure, stroke, cardiogenic syncope, near syncope, neurogenic syncope, atypical absence seizure, possible Parkinson's related change, for patient's wound considered cellulitis, abscess, versus delayed wound healing.   This is not an exhaustive differential.   Past Medical History / Co-morbidities / Social History: hyperlipidemia, hypertension, CAD, previous history of CABG, Parkinsons  Additional history: Chart reviewed. Pertinent results include:  Reviewed lab work, imaging from recent previous emergency department visits, outpatient neurology visits  Physical Exam: Physical exam performed. The pertinent findings include: On my exam patient was noted focal neurologic deficits, is able to ambulate without difficulty, has had no transient losses of awareness or consciousness during his evaluation today.  He does have a wound noted on the left  upper thigh which appears appropriately healing at this time, I do not see significant signs of cellulitis, I think it would be appropriate for topical antibiotics alone.  Lab Tests: I ordered, and personally interpreted labs.  The pertinent results include: UA is unremarkable, normal specific gravity, low clinical concern for dehydration, CBC unremarkable, no leukocytosis, clinically significant anemia or platelet dysfunction.  CMP notable for no kidney dysfunction, mild hyperglycemia 123 on nonfasting lab values, otherwise reassuring metabolic panel.   Imaging Studies: I ordered imaging studies including CT head without contrast. I independently visualized and interpreted imaging which showed no acute intracranial abnormality. I agree with the radiologist interpretation.   Cardiac Monitoring:  The patient was maintained on a cardiac monitor.  My attending physician Dr. Francia Greaves viewed and interpreted the cardiac monitored which showed an underlying rhythm of: Normal sinus rhythm with first heart block, 1 PVC. I agree with this interpretation.   Medications: I ordered medication including fluid bolus for possible mild dehydration. Reevaluation of the patient after these medicines showed that the patient improved. I have reviewed the patients home medicines and have made adjustments as needed.   Disposition: After consideration of the diagnostic results and the patients response to treatment, I feel that patient's clinical presentation at this time does not seem consistent with acute stroke, and  description of his transient alteration of consciousness is not consistent with typical seizure without postictal period, tonic-clonic activity, history of previous seizures, intracranial bleeding. Encouraged outpatient neuro follow up.   emergency department workup does not suggest an emergent condition requiring admission or immediate intervention beyond what has been performed at this time. The plan is: as above. The patient is safe for discharge and has been instructed to return immediately for worsening symptoms, change in symptoms or any other concerns.  I discussed this case with my attending physician Dr. Francia Greaves who cosigned this note including patient's presenting symptoms, physical exam, and planned diagnostics and interventions. Attending physician stated agreement with plan or made changes to plan which were implemented.    Final Clinical Impression(s) / ED Diagnoses Final diagnoses:  Wound of left lower extremity, initial encounter  Transient alteration of awareness    Rx / DC Orders ED Discharge Orders     None         Dorien Chihuahua 09/04/22 1810    Valarie Merino, MD 09/05/22 [phone]

## 2022-09-04 NOTE — Discharge Instructions (Signed)
I would continue to use Polysporin, Neosporin, or bacitracin twice daily to the affected wound site, and monitor for healing.  I think that it is okay to discontinue the doxycycline at this time if it is causing severe stomach irritation as they do not see signs of severe cellulitis, and think that this wound is likely to respond to topical treatment alone.  In regards to patient's transient alteration of awareness, AKA the episodes where he is not aware what is going on around him for a few seconds before returning to normal consciousness I recommend that you follow-up outpatient with his neurologist, I see no signs of seizure-like activity, stroke, or other intracranial abnormality to explain his symptoms.  If he has more of these episodes, I recommend that you return to the emergency department for further evaluation, especially while the episodes are occurring.

## 2022-09-04 NOTE — ED Notes (Signed)
Pt states understanding of dc instructions, importance of follow up. Pt denies questions or concerns upon dc. Pt wheeled out of ed via wheelchair by ed rn and assisted into private vehicle. No belongings left in room upon dc.

## 2022-09-05 ENCOUNTER — Telehealth: Payer: Self-pay | Admitting: Neurology

## 2022-09-05 ENCOUNTER — Telehealth: Payer: Self-pay | Admitting: Internal Medicine

## 2022-09-05 NOTE — Telephone Encounter (Signed)
Keevon was blacking out for 30-40 seconds at a time. He would look at his wife with confusion. He was sent to ER. His wife wants to knows if the new medicine Rytary is the cause of this. They checked him out and sent him home. Told her to check with [name] and his PCP. He is fine now. His wife looked up Rytary side effects and she is afraid of him taking this due to side effects. Wants [name]'s opinion

## 2022-09-05 NOTE — Telephone Encounter (Signed)
Went to ED yesterday, thought he was having a seizure, was advised to f/u with his neurologist regarding a medication they prescribed. Wanted to provide update to pcp

## 2022-09-06 ENCOUNTER — Telehealth: Payer: Self-pay | Admitting: Cardiovascular Disease

## 2022-09-06 NOTE — Telephone Encounter (Signed)
Spoke with pt's wife and was instructed to call cardiology Pt  has had a couple of episodes where pt will just fall asleep  and episodes last for 30-40 seconds  Per wife pt had similar episode 1 month ago and has seen neuro Pt's meds have been changed to Carbidopa/levodopa  and this is listed as side effect Per wife neuro said could stop med and pt states feels better with this change and does not want to stop Pt is leaving for [place] next week  (09-14-22 ) and will be gone for lengthy amount of time 1 month or more .Will forward to Dr Johnsie Cancel for review and recommendations./cy

## 2022-09-06 NOTE — Telephone Encounter (Signed)
Patients leg is not longer red or pink the hole is closing up and the bacitracin seems to be helping and no more pain. Patients wife is contacting the cardiologist but wants to know if they can reduce Rytary to one in the morning and one at night

## 2022-09-06 NOTE — Telephone Encounter (Signed)
Called Patients wife and patient has had this episode last month and then had two episodes in the same day. The second episode is when patients wife called the EMS they took patient to hospital and wife said tat they did a full cardiac workup for this patient. Patients wife has to take patient to [place] for a surgery for there daughter and she is worried this will happen again. She did tell me that the patient appeared to be doing better while on the rytary. She doesn't know what is causing it but she had read that the rytary could cause him to just fall asleep

## 2022-09-06 NOTE — Telephone Encounter (Signed)
Wife is asking that the nurse give her a call she had things she was to discuss with her. Please advise

## 2022-09-07 NOTE — Telephone Encounter (Signed)
Called patient and she understands that medication needs to be taken TID and she is going to continue doing that

## 2022-09-10 ENCOUNTER — Other Ambulatory Visit: Payer: Self-pay | Admitting: Internal Medicine

## 2022-09-11 ENCOUNTER — Other Ambulatory Visit: Payer: Self-pay | Admitting: Neurology

## 2022-09-15 NOTE — Telephone Encounter (Signed)
Patient's wife returned call.  

## 2022-09-15 NOTE — Telephone Encounter (Signed)
Lm to call back ./cy 

## 2022-09-15 NOTE — Telephone Encounter (Signed)
Pt's wife aware not heart related and pt's wife did not think was related but was told to call Pt feeling fine at this time ./cy

## 2022-09-19 ENCOUNTER — Telehealth: Payer: Self-pay | Admitting: Neurology

## 2022-09-19 NOTE — Telephone Encounter (Signed)
Pts skin biopsy came back.  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  No evidence of small fiber neuropathy 3.  No evidence of amyloid deposition within the cutaneous nerves  Please call pt/family and let them know that his skin biopsy is consistent with a parkinsonian state (and not PSP).  Please put him on a cancellation list, as I see his next appointment is not until February.

## 2022-09-20 NOTE — Telephone Encounter (Signed)
Patient has been added to the wait list

## 2022-09-20 NOTE — Telephone Encounter (Signed)
Called patients wife and gave her results and let her know no questions at this time I let her know they are on the CX list

## 2022-09-28 ENCOUNTER — Telehealth: Payer: Self-pay | Admitting: Pharmacist

## 2022-09-28 NOTE — Chronic Care Management (AMB) (Signed)
Chronic Care Management Pharmacy Assistant   Name: [name]  MRN: [id number] DOB: January 06, 1941  Reason for Encounter: ER Follow up Assessment per Jeni Salles    Recent office visits:  08/31/22 Jon Ruddy, MD - Patient presented for Infected wound and other concerns. Prescribed Doxycycline Hyclate.  Recent consult visits:  08/23/22 [name], Jon Quail, DO - Patient presented for Wound infection. Prescribed Cephalexin.  08/18/22  TatEustace Quail, DO - Patient presented for Bradykinesia.  Lidocaine with Epi Administered.  07/27/22 TatEustace Quail, DO - Patient presented for Parkinson's disease and other concerns. Increased Carbidopa-Levodopa.  07/11/22 Jon Hector, MD (Cardiology) - Patient presented for CAD involving coronary bypass graft of native heart without angina pectoris and other concerns. No medication changes refills ordered.  Hospital visits:  Medication Reconciliation was completed by comparing discharge summary, patient's EMR and Pharmacy list, and upon discussion with patient.  Patient presented to [facility] ED on 09/04/22 due to Wound of left lower extremity and other concerns. Patient was present for 5 hours.    New?Medications Started at [facility] Discharge:?? -started none  Medication Changes at Hospital Discharge: -Changed  none  Medications Discontinued at Hospital Discharge: -Stopped  none  Medications that remain the same after Hospital Discharge:??  -All other medications will remain the same.    Medications: Outpatient Encounter Medications as of 09/28/2022  Medication Sig   acetaminophen (TYLENOL) 500 MG tablet Take by mouth.   aspirin 81 MG chewable tablet  81 mg, = 1 tab, Oral, Daily, 30 tab, 0 Refill(s), Do Not Route   Carbidopa-Levodopa ER (RYTARY) 48.75-195 MG CPCR 1 at 9am/noon/5pm   Carbidopa-Levodopa ER (RYTARY) 48.75-195 MG CPCR Samples of this drug were given to the patient, quantity 4, Lot Number [id number]  07/2022   cephALEXin (KEFLEX) 500 MG capsule Take 1 capsule (500 mg total) by mouth 3 (three) times daily.   Cholecalciferol (VITAMIN D3) 125 MCG (5000 UT) CAPS Take 1 capsule (5,000 Units total) by mouth daily.   clobetasol (OLUX) 0.05 % topical foam APPLY to back and leg   CLOBETASOL PROPIONATE E 0.05 % emollient cream Apply 1 application topically 2 (two) times daily.   Coenzyme Q10 100 MG capsule  100 mg = 1 cap, Cap, Oral, Daily, 30 cap, 0 Refill(s)   Cyanocobalamin (B-12) 100 MCG TABS Take 1 tablet by mouth. Once every three days   DHA-EPA-Vitamin E (OMEGA-3 COMPLEX PO) Take 1,200 mg by mouth daily at 6 (six) AM. ProOmega LDL   famotidine (PEPCID) 20 MG tablet Take one tablet in the morning and one tablet at bedtime.   magnesium oxide (MAG-OX) 400 MG tablet Take 400 mg daily by mouth.   meclizine (ANTIVERT) 25 MG tablet Take 0.5-1 tablets (12.5-25 mg total) by mouth 3 (three) times daily as needed for dizziness.   metoprolol tartrate (LOPRESSOR) 50 MG tablet TAKE 1/2 TABLET BY MOUTH IN THE MORNING AND 1 TABLET BY MOUTH IN THE EVENING   MULTIPLE VITAMIN PO Take 1 tablet by mouth daily.   MYRBETRIQ 25 MG TB24 tablet Take 25 mg by mouth daily.   nitroGLYCERIN (NITROSTAT) 0.4 MG SL tablet Take 1 tablet, under your tongue, while sitting for chest pain  If no relief of pain may repeat NTG, one tab every 5 minutes up to 3 tablets total over 15 minutes.  If no relief CALL 911.   ondansetron (ZOFRAN) 4 MG tablet Take 1 tablet (4 mg total) by mouth every 8 (  eight) hours as needed for nausea or vomiting.   tamsulosin (FLOMAX) 0.4 MG CAPS capsule Take 1 capsule (0.4 mg total) by mouth daily.   vitamin C (ASCORBIC ACID) 250 MG tablet Take 250 mg by mouth daily.   Facility-Administered Encounter Medications as of 09/28/2022  Medication   lidocaine-EPINEPHrine (XYLOCAINE W/EPI) 1 %-1:100000 (with pres) injection 4 mL    Contacted [name] for General Review Call  Adherence Review:  Does  the Clinical Pharmacist Assistant have access to adherence rates? Yes Adherence rates for STAR metric medications No star rated medications  Disease State Questions:  Able to connect with Patient? Spoke to patients wife Did patient have any problems with their health recently? Yes Wife reports he had a wound that became infected and required   an ED visit Have you had any admissions or emergency room visits or worsening of your condition(s) since last visit? Yes  Have you had any visits with new specialists or providers since your last visit? No  Have you had any new health care problem(s) since your last visit? No : Have you run out of any of your medications since you last spoke with clinical pharmacist? No  Are there any medications you are not taking as prescribed? No  Are you having any issues or side effects with your medications? No  Do you have any other health concerns or questions you want to discuss with your Clinical Pharmacist before your next visit? No  Are there any health concerns that you feel we can do a better job addressing? No  Are you having any problems with any of the following since the last visit: (select all that apply)  None  12. Any falls since last visit? No  13. Any increased or uncontrolled pain since last visit? No  . Additional Details? Wife reports husbands wound is much better and mostly healed. She reports no questions or concerns at this time. Decl CCM follow up at this time.     Care Gaps: COVID Booster - Overdue Flu Vaccine - Overdue CCM- Wife reports not necessary at this time BP- 122/68 08/31/22 AWV- 12/22  Star Rating Drugs: None    Ned Clines [facility] Clinical Pharmacist Assistant [phone]

## 2022-10-11 DIAGNOSIS — R319 Hematuria, unspecified: Secondary | ICD-10-CM | POA: Diagnosis not present

## 2022-10-11 DIAGNOSIS — N390 Urinary tract infection, site not specified: Secondary | ICD-10-CM | POA: Diagnosis not present

## 2022-10-12 DIAGNOSIS — Z23 Encounter for immunization: Secondary | ICD-10-CM | POA: Diagnosis not present

## 2022-10-13 DIAGNOSIS — N281 Cyst of kidney, acquired: Secondary | ICD-10-CM | POA: Diagnosis not present

## 2022-10-13 DIAGNOSIS — R319 Hematuria, unspecified: Secondary | ICD-10-CM | POA: Diagnosis not present

## 2022-10-18 DIAGNOSIS — M47819 Spondylosis without myelopathy or radiculopathy, site unspecified: Secondary | ICD-10-CM | POA: Diagnosis not present

## 2022-10-18 DIAGNOSIS — I259 Chronic ischemic heart disease, unspecified: Secondary | ICD-10-CM | POA: Diagnosis not present

## 2022-10-18 DIAGNOSIS — K219 Gastro-esophageal reflux disease without esophagitis: Secondary | ICD-10-CM | POA: Diagnosis not present

## 2022-10-18 DIAGNOSIS — G20B2 Parkinson's disease with dyskinesia, with fluctuations: Secondary | ICD-10-CM | POA: Diagnosis not present

## 2022-10-18 DIAGNOSIS — Z0000 Encounter for general adult medical examination without abnormal findings: Secondary | ICD-10-CM | POA: Diagnosis not present

## 2022-10-18 DIAGNOSIS — Z951 Presence of aortocoronary bypass graft: Secondary | ICD-10-CM | POA: Diagnosis not present

## 2022-10-18 DIAGNOSIS — I240 Acute coronary thrombosis not resulting in myocardial infarction: Secondary | ICD-10-CM | POA: Diagnosis not present

## 2022-10-19 DIAGNOSIS — R399 Unspecified symptoms and signs involving the genitourinary system: Secondary | ICD-10-CM | POA: Diagnosis not present

## 2022-10-19 DIAGNOSIS — R32 Unspecified urinary incontinence: Secondary | ICD-10-CM | POA: Diagnosis not present

## 2022-10-19 DIAGNOSIS — N400 Enlarged prostate without lower urinary tract symptoms: Secondary | ICD-10-CM | POA: Diagnosis not present

## 2022-10-19 DIAGNOSIS — R310 Gross hematuria: Secondary | ICD-10-CM | POA: Diagnosis not present

## 2022-10-19 DIAGNOSIS — Z112 Encounter for screening for other bacterial diseases: Secondary | ICD-10-CM | POA: Diagnosis not present

## 2022-11-07 ENCOUNTER — Other Ambulatory Visit: Payer: Self-pay | Admitting: Internal Medicine

## 2022-11-07 DIAGNOSIS — R310 Gross hematuria: Secondary | ICD-10-CM | POA: Diagnosis not present

## 2022-11-07 DIAGNOSIS — N200 Calculus of kidney: Secondary | ICD-10-CM | POA: Diagnosis not present

## 2022-11-07 DIAGNOSIS — Z112 Encounter for screening for other bacterial diseases: Secondary | ICD-10-CM | POA: Diagnosis not present

## 2022-11-07 DIAGNOSIS — N400 Enlarged prostate without lower urinary tract symptoms: Secondary | ICD-10-CM | POA: Diagnosis not present

## 2022-11-07 DIAGNOSIS — R399 Unspecified symptoms and signs involving the genitourinary system: Secondary | ICD-10-CM | POA: Diagnosis not present

## 2022-11-07 DIAGNOSIS — R32 Unspecified urinary incontinence: Secondary | ICD-10-CM | POA: Diagnosis not present

## 2022-11-07 DIAGNOSIS — N210 Calculus in bladder: Secondary | ICD-10-CM | POA: Diagnosis not present

## 2022-11-07 DIAGNOSIS — K5790 Diverticulosis of intestine, part unspecified, without perforation or abscess without bleeding: Secondary | ICD-10-CM | POA: Diagnosis not present

## 2022-11-13 DIAGNOSIS — I2510 Atherosclerotic heart disease of native coronary artery without angina pectoris: Secondary | ICD-10-CM | POA: Diagnosis not present

## 2022-11-13 DIAGNOSIS — I10 Essential (primary) hypertension: Secondary | ICD-10-CM | POA: Diagnosis not present

## 2022-11-13 DIAGNOSIS — E785 Hyperlipidemia, unspecified: Secondary | ICD-10-CM | POA: Diagnosis not present

## 2022-11-13 DIAGNOSIS — Z951 Presence of aortocoronary bypass graft: Secondary | ICD-10-CM | POA: Diagnosis not present

## 2022-11-23 DIAGNOSIS — G20A1 Parkinson's disease without dyskinesia, without mention of fluctuations: Secondary | ICD-10-CM | POA: Diagnosis not present

## 2022-11-29 DIAGNOSIS — N210 Calculus in bladder: Secondary | ICD-10-CM | POA: Diagnosis not present

## 2022-11-29 DIAGNOSIS — Z01818 Encounter for other preprocedural examination: Secondary | ICD-10-CM | POA: Diagnosis not present

## 2022-11-30 DIAGNOSIS — N400 Enlarged prostate without lower urinary tract symptoms: Secondary | ICD-10-CM | POA: Diagnosis not present

## 2022-11-30 DIAGNOSIS — N210 Calculus in bladder: Secondary | ICD-10-CM | POA: Diagnosis not present

## 2022-11-30 DIAGNOSIS — R399 Unspecified symptoms and signs involving the genitourinary system: Secondary | ICD-10-CM | POA: Diagnosis not present

## 2022-11-30 DIAGNOSIS — R310 Gross hematuria: Secondary | ICD-10-CM | POA: Diagnosis not present

## 2022-12-04 DIAGNOSIS — R310 Gross hematuria: Secondary | ICD-10-CM | POA: Diagnosis not present

## 2022-12-04 DIAGNOSIS — N210 Calculus in bladder: Secondary | ICD-10-CM | POA: Diagnosis not present

## 2022-12-04 DIAGNOSIS — R399 Unspecified symptoms and signs involving the genitourinary system: Secondary | ICD-10-CM | POA: Diagnosis not present

## 2022-12-07 ENCOUNTER — Other Ambulatory Visit: Payer: Self-pay | Admitting: Neurology

## 2023-01-01 ENCOUNTER — Telehealth: Payer: Self-pay | Admitting: Internal Medicine

## 2023-01-01 NOTE — Telephone Encounter (Signed)
Spoke to patient spouse to schedule AWV  She stated they moved to [place]

## 2023-01-02 ENCOUNTER — Ambulatory Visit: Payer: Medicare Other | Admitting: Physician Assistant

## 2023-01-08 ENCOUNTER — Other Ambulatory Visit: Payer: Self-pay | Admitting: Internal Medicine

## 2023-01-15 ENCOUNTER — Other Ambulatory Visit: Payer: Self-pay | Admitting: Internal Medicine

## 2023-01-19 DIAGNOSIS — R001 Bradycardia, unspecified: Secondary | ICD-10-CM | POA: Diagnosis not present

## 2023-01-19 DIAGNOSIS — Z8669 Personal history of other diseases of the nervous system and sense organs: Secondary | ICD-10-CM | POA: Diagnosis not present

## 2023-01-19 DIAGNOSIS — I10 Essential (primary) hypertension: Secondary | ICD-10-CM | POA: Diagnosis not present

## 2023-01-19 DIAGNOSIS — R55 Syncope and collapse: Secondary | ICD-10-CM | POA: Diagnosis not present

## 2023-01-19 DIAGNOSIS — G40909 Epilepsy, unspecified, not intractable, without status epilepticus: Secondary | ICD-10-CM | POA: Diagnosis not present

## 2023-01-19 DIAGNOSIS — Z951 Presence of aortocoronary bypass graft: Secondary | ICD-10-CM | POA: Diagnosis not present

## 2023-01-19 DIAGNOSIS — G20A1 Parkinson's disease without dyskinesia, without mention of fluctuations: Secondary | ICD-10-CM | POA: Diagnosis not present

## 2023-01-19 DIAGNOSIS — Z7982 Long term (current) use of aspirin: Secondary | ICD-10-CM | POA: Diagnosis not present

## 2023-01-19 DIAGNOSIS — I509 Heart failure, unspecified: Secondary | ICD-10-CM | POA: Diagnosis not present

## 2023-01-19 DIAGNOSIS — R569 Unspecified convulsions: Secondary | ICD-10-CM | POA: Diagnosis not present

## 2023-01-19 DIAGNOSIS — J986 Disorders of diaphragm: Secondary | ICD-10-CM | POA: Diagnosis not present

## 2023-01-19 DIAGNOSIS — Z87891 Personal history of nicotine dependence: Secondary | ICD-10-CM | POA: Diagnosis not present

## 2023-01-19 DIAGNOSIS — R9431 Abnormal electrocardiogram [ECG] [EKG]: Secondary | ICD-10-CM | POA: Diagnosis not present

## 2023-01-19 DIAGNOSIS — E785 Hyperlipidemia, unspecified: Secondary | ICD-10-CM | POA: Diagnosis not present

## 2023-01-19 DIAGNOSIS — I2510 Atherosclerotic heart disease of native coronary artery without angina pectoris: Secondary | ICD-10-CM | POA: Diagnosis not present

## 2023-01-26 NOTE — Progress Notes (Deleted)
Assessment/Plan:   1.  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  -Patient tolerating Rytary 195 mg, 1 capsule 3 times per day.  He is doing much better with this medication than he has other levodopa formulations.             -Discussed extensively how levodopa interacts with protein.             -MBE in May, 2022 was normal.  -PT recommended but declined .   2.  Vertigo             -hx of bppv.    3.  syncope  -recent episode doesn't really sound orthostatic as he has it while seated;  wonder if he is experiencing arrhthymia.  Cardiology aware  -the episodes he has when he bends over and feels near syncopal could be related to orthostasis.  Discussed importance of hydration.  He admits he really is not drinking water.  Wife does nagged him about this.        Subjective:   [name] was seen today in follow-up for parkinsonism.  Skin biopsy completed.  There was evidence of alpha-synuclein in the distal leg biopsy site.  Current movement disorder medication: Rytary 195, 1 capsule 3 times per day  Prior meds: Carbidopa/levodopa 25/100 IR (stopped in case was contributing to nausea)  ALLERGIES:   Allergies  Allergen Reactions   Propoxyphene N-Acetaminophen Nausea And Vomiting   Iodinated Contrast Media Swelling    Pt developed slight lt upper lip swelling only to one side about 15 minutes post IV contrast injection. No other symptoms.     CURRENT MEDICATIONS:  Current Outpatient Medications  Medication Instructions   acetaminophen (TYLENOL) 500 MG tablet Oral   aspirin 81 MG chewable tablet  81 mg, = 1 tab, Oral, Daily, 30 tab, 0 Refill(s), Do Not Route   Carbidopa-Levodopa ER (RYTARY) 48.75-195 MG CPCR 1 at 9am/noon/5pm   Carbidopa-Levodopa  ER (RYTARY) 48.75-195 MG CPCR Samples of this drug were given to the patient, quantity 4, Lot Number [id number] Exp 07/2022   cephALEXin (KEFLEX) 500 mg, Oral, 3 times daily   clobetasol (OLUX) 0.05 % topical foam APPLY to back and leg   CLOBETASOL PROPIONATE E 0.05 % emollient cream 1 application , Topical, 2 times daily   Coenzyme Q10 100 MG capsule  100 mg = 1 cap, Cap, Oral, Daily, 30 cap, 0 Refill(s)   Cyanocobalamin (B-12) 100 MCG TABS 1 tablet, Oral, Once every three days   DHA-EPA-Vitamin E (OMEGA-3 COMPLEX PO) 1,200 mg, Oral, Daily, ProOmega LDL    famotidine (PEPCID) 20 MG tablet Take one tablet in the morning and one tablet at bedtime.   magnesium oxide (MAG-OX) 400 mg, Oral, Daily   meclizine (ANTIVERT) 12.5-25 mg, Oral, 3 times daily PRN   metoprolol tartrate (LOPRESSOR) 50 MG tablet TAKE 1/2 TABLET BY MOUTH IN THE MORNING AND 1 TABLET BY MOUTH IN THE EVENING   MULTIPLE VITAMIN PO 1 tablet, Oral, Daily   Myrbetriq 25 mg, Oral, Daily   nitroGLYCERIN (NITROSTAT) 0.4 MG SL tablet Take 1 tablet, under your tongue, while sitting for chest pain  If no relief of pain may repeat NTG, one tab every 5 minutes up to 3 tablets total over 15 minutes.  If no relief CALL 911.   ondansetron (ZOFRAN) 4 mg, Oral, Every 8 hours PRN   tamsulosin (FLOMAX) 0.4 mg, Oral, Daily   vitamin C (ASCORBIC ACID) 250 mg, Oral, Daily   Vitamin D3 5,000 Units, Oral, Daily    Objective:   VITALS:   There were no vitals filed for this visit.      GEN:  The patient appears stated age and is in NAD. HEENT:  Normocephalic, atraumatic.  The mucous membranes are moist.  Skin:  there is redness and induration around biopsy site over the L thigh  Neurological examination:  Orientation: The patient is alert and oriented x3.  Cranial nerves: There is good facial symmetry. There is facial hypomimia.  Extraocular muscles are intact. The visual fields are full to confrontational testing. The speech is fluent and  clear. He is just slightly hypophonic.  Soft palate rises symmetrically and there is no tongue deviation. Hearing is intact to conversational tone. Motor: Strength is at least antigravity x4  Movement examination: Not tested today.  Most of the visit was spent in counseling. I have reviewed and interpreted the following labs independently   Chemistry      Component Value Date/Time   NA 144 09/04/2022 1431   NA 141 07/14/2020 1407   K 3.9 09/04/2022 1431   CL 111 09/04/2022 1431   CO2 27 09/04/2022 1431   BUN 16 09/04/2022 1431   BUN 15 07/14/2020 1407   CREATININE 0.77 09/04/2022 1431      Component Value Date/Time   CALCIUM 9.6 09/04/2022 1431   ALKPHOS 65 09/04/2022 1431   AST 17 09/04/2022 1431   ALT 9 09/04/2022 1431   BILITOT 0.8 09/04/2022 1431   BILITOT 0.6 10/13/2019 1523      Lab Results  Component Value Date   TSH 1.49 03/28/2021   Lab Results  Component Value Date   WBC 7.9 09/04/2022   HGB 16.4 09/04/2022   HCT 47.4 09/04/2022   MCV 94.0 09/04/2022   PLT 273 09/04/2022      Cc:  No primary care provider on file.

## 2023-01-29 ENCOUNTER — Ambulatory Visit: Payer: Medicare Other | Admitting: Neurology

## 2023-02-15 DIAGNOSIS — G40109 Localization-related (focal) (partial) symptomatic epilepsy and epileptic syndromes with simple partial seizures, not intractable, without status epilepticus: Secondary | ICD-10-CM | POA: Diagnosis not present

## 2023-02-15 DIAGNOSIS — G20A1 Parkinson's disease without dyskinesia, without mention of fluctuations: Secondary | ICD-10-CM | POA: Diagnosis not present

## 2023-02-16 DIAGNOSIS — N210 Calculus in bladder: Secondary | ICD-10-CM | POA: Diagnosis not present

## 2023-02-16 DIAGNOSIS — N400 Enlarged prostate without lower urinary tract symptoms: Secondary | ICD-10-CM | POA: Diagnosis not present

## 2023-02-16 DIAGNOSIS — R339 Retention of urine, unspecified: Secondary | ICD-10-CM | POA: Diagnosis not present

## 2023-02-23 DIAGNOSIS — Z951 Presence of aortocoronary bypass graft: Secondary | ICD-10-CM | POA: Diagnosis not present

## 2023-02-23 DIAGNOSIS — G20B2 Parkinson's disease with dyskinesia, with fluctuations: Secondary | ICD-10-CM | POA: Diagnosis not present

## 2023-02-23 DIAGNOSIS — R569 Unspecified convulsions: Secondary | ICD-10-CM | POA: Diagnosis not present

## 2023-02-23 DIAGNOSIS — J302 Other seasonal allergic rhinitis: Secondary | ICD-10-CM | POA: Diagnosis not present

## 2023-03-22 DIAGNOSIS — N400 Enlarged prostate without lower urinary tract symptoms: Secondary | ICD-10-CM | POA: Diagnosis not present

## 2023-03-22 DIAGNOSIS — N390 Urinary tract infection, site not specified: Secondary | ICD-10-CM | POA: Diagnosis not present

## 2023-04-02 DIAGNOSIS — N210 Calculus in bladder: Secondary | ICD-10-CM | POA: Diagnosis not present

## 2023-04-02 DIAGNOSIS — N400 Enlarged prostate without lower urinary tract symptoms: Secondary | ICD-10-CM | POA: Diagnosis not present

## 2023-04-02 DIAGNOSIS — R310 Gross hematuria: Secondary | ICD-10-CM | POA: Diagnosis not present

## 2023-04-05 DIAGNOSIS — R339 Retention of urine, unspecified: Secondary | ICD-10-CM | POA: Diagnosis not present

## 2023-04-25 DIAGNOSIS — I10 Essential (primary) hypertension: Secondary | ICD-10-CM | POA: Diagnosis not present

## 2023-04-29 ENCOUNTER — Other Ambulatory Visit: Payer: Self-pay | Admitting: Family

## 2023-05-10 DIAGNOSIS — G20A1 Parkinson's disease without dyskinesia, without mention of fluctuations: Secondary | ICD-10-CM | POA: Diagnosis not present

## 2023-05-10 DIAGNOSIS — G40109 Localization-related (focal) (partial) symptomatic epilepsy and epileptic syndromes with simple partial seizures, not intractable, without status epilepticus: Secondary | ICD-10-CM | POA: Diagnosis not present

## 2023-05-10 DIAGNOSIS — R2681 Unsteadiness on feet: Secondary | ICD-10-CM | POA: Diagnosis not present

## 2023-05-14 DIAGNOSIS — E785 Hyperlipidemia, unspecified: Secondary | ICD-10-CM | POA: Diagnosis not present

## 2023-05-14 DIAGNOSIS — I6529 Occlusion and stenosis of unspecified carotid artery: Secondary | ICD-10-CM | POA: Diagnosis not present

## 2023-05-14 DIAGNOSIS — I2510 Atherosclerotic heart disease of native coronary artery without angina pectoris: Secondary | ICD-10-CM | POA: Diagnosis not present

## 2023-05-14 DIAGNOSIS — I350 Nonrheumatic aortic (valve) stenosis: Secondary | ICD-10-CM | POA: Diagnosis not present

## 2023-05-14 DIAGNOSIS — I10 Essential (primary) hypertension: Secondary | ICD-10-CM | POA: Diagnosis not present

## 2023-05-15 DIAGNOSIS — N410 Acute prostatitis: Secondary | ICD-10-CM | POA: Diagnosis not present

## 2023-05-29 DIAGNOSIS — N210 Calculus in bladder: Secondary | ICD-10-CM | POA: Diagnosis not present

## 2023-05-29 DIAGNOSIS — N410 Acute prostatitis: Secondary | ICD-10-CM | POA: Diagnosis not present

## 2023-05-29 DIAGNOSIS — N400 Enlarged prostate without lower urinary tract symptoms: Secondary | ICD-10-CM | POA: Diagnosis not present

## 2023-05-29 DIAGNOSIS — R310 Gross hematuria: Secondary | ICD-10-CM | POA: Diagnosis not present

## 2023-05-29 DIAGNOSIS — R339 Retention of urine, unspecified: Secondary | ICD-10-CM | POA: Diagnosis not present

## 2023-05-29 DIAGNOSIS — R32 Unspecified urinary incontinence: Secondary | ICD-10-CM | POA: Diagnosis not present

## 2023-05-30 DIAGNOSIS — G20B1 Parkinson's disease with dyskinesia, without mention of fluctuations: Secondary | ICD-10-CM | POA: Diagnosis not present

## 2023-06-06 DIAGNOSIS — G20B1 Parkinson's disease with dyskinesia, without mention of fluctuations: Secondary | ICD-10-CM | POA: Diagnosis not present

## 2023-06-08 DIAGNOSIS — G20B1 Parkinson's disease with dyskinesia, without mention of fluctuations: Secondary | ICD-10-CM | POA: Diagnosis not present

## 2023-06-13 DIAGNOSIS — G20B1 Parkinson's disease with dyskinesia, without mention of fluctuations: Secondary | ICD-10-CM | POA: Diagnosis not present

## 2023-06-15 DIAGNOSIS — G20B1 Parkinson's disease with dyskinesia, without mention of fluctuations: Secondary | ICD-10-CM | POA: Diagnosis not present

## 2023-06-20 DIAGNOSIS — G20B1 Parkinson's disease with dyskinesia, without mention of fluctuations: Secondary | ICD-10-CM | POA: Diagnosis not present

## 2023-06-22 DIAGNOSIS — G20B1 Parkinson's disease with dyskinesia, without mention of fluctuations: Secondary | ICD-10-CM | POA: Diagnosis not present

## 2023-06-26 DIAGNOSIS — Z112 Encounter for screening for other bacterial diseases: Secondary | ICD-10-CM | POA: Diagnosis not present

## 2023-06-26 DIAGNOSIS — N410 Acute prostatitis: Secondary | ICD-10-CM | POA: Diagnosis not present

## 2023-06-26 DIAGNOSIS — N400 Enlarged prostate without lower urinary tract symptoms: Secondary | ICD-10-CM | POA: Diagnosis not present

## 2023-06-26 DIAGNOSIS — R32 Unspecified urinary incontinence: Secondary | ICD-10-CM | POA: Diagnosis not present

## 2023-06-26 DIAGNOSIS — N210 Calculus in bladder: Secondary | ICD-10-CM | POA: Diagnosis not present

## 2023-06-27 DIAGNOSIS — G20B1 Parkinson's disease with dyskinesia, without mention of fluctuations: Secondary | ICD-10-CM | POA: Diagnosis not present

## 2023-07-04 ENCOUNTER — Other Ambulatory Visit: Payer: Self-pay | Admitting: Internal Medicine

## 2023-07-04 DIAGNOSIS — G20B1 Parkinson's disease with dyskinesia, without mention of fluctuations: Secondary | ICD-10-CM | POA: Diagnosis not present

## 2023-07-06 DIAGNOSIS — G20B1 Parkinson's disease with dyskinesia, without mention of fluctuations: Secondary | ICD-10-CM | POA: Diagnosis not present

## 2023-07-09 DIAGNOSIS — G20B1 Parkinson's disease with dyskinesia, without mention of fluctuations: Secondary | ICD-10-CM | POA: Diagnosis not present

## 2023-07-11 DIAGNOSIS — K219 Gastro-esophageal reflux disease without esophagitis: Secondary | ICD-10-CM | POA: Diagnosis not present

## 2023-07-11 DIAGNOSIS — R42 Dizziness and giddiness: Secondary | ICD-10-CM | POA: Diagnosis not present

## 2023-07-11 DIAGNOSIS — I2510 Atherosclerotic heart disease of native coronary artery without angina pectoris: Secondary | ICD-10-CM | POA: Diagnosis not present

## 2023-07-11 DIAGNOSIS — R9431 Abnormal electrocardiogram [ECG] [EKG]: Secondary | ICD-10-CM | POA: Diagnosis not present

## 2023-07-11 DIAGNOSIS — Z951 Presence of aortocoronary bypass graft: Secondary | ICD-10-CM | POA: Diagnosis not present

## 2023-07-11 DIAGNOSIS — I259 Chronic ischemic heart disease, unspecified: Secondary | ICD-10-CM | POA: Diagnosis not present

## 2023-07-11 DIAGNOSIS — Z87891 Personal history of nicotine dependence: Secondary | ICD-10-CM | POA: Diagnosis not present

## 2023-07-11 DIAGNOSIS — R110 Nausea: Secondary | ICD-10-CM | POA: Diagnosis not present

## 2023-07-11 DIAGNOSIS — R079 Chest pain, unspecified: Secondary | ICD-10-CM | POA: Diagnosis not present

## 2023-07-13 DIAGNOSIS — I350 Nonrheumatic aortic (valve) stenosis: Secondary | ICD-10-CM | POA: Diagnosis not present

## 2023-07-13 DIAGNOSIS — I10 Essential (primary) hypertension: Secondary | ICD-10-CM | POA: Diagnosis not present

## 2023-07-13 DIAGNOSIS — E785 Hyperlipidemia, unspecified: Secondary | ICD-10-CM | POA: Diagnosis not present

## 2023-07-13 DIAGNOSIS — R079 Chest pain, unspecified: Secondary | ICD-10-CM | POA: Diagnosis not present

## 2023-07-13 DIAGNOSIS — I2510 Atherosclerotic heart disease of native coronary artery without angina pectoris: Secondary | ICD-10-CM | POA: Diagnosis not present

## 2023-07-14 DIAGNOSIS — H35363 Drusen (degenerative) of macula, bilateral: Secondary | ICD-10-CM | POA: Diagnosis not present

## 2023-07-14 DIAGNOSIS — H5203 Hypermetropia, bilateral: Secondary | ICD-10-CM | POA: Diagnosis not present

## 2023-07-14 DIAGNOSIS — H52223 Regular astigmatism, bilateral: Secondary | ICD-10-CM | POA: Diagnosis not present

## 2023-07-14 DIAGNOSIS — H43393 Other vitreous opacities, bilateral: Secondary | ICD-10-CM | POA: Diagnosis not present

## 2023-07-16 DIAGNOSIS — G20B1 Parkinson's disease with dyskinesia, without mention of fluctuations: Secondary | ICD-10-CM | POA: Diagnosis not present

## 2023-07-18 DIAGNOSIS — G20B1 Parkinson's disease with dyskinesia, without mention of fluctuations: Secondary | ICD-10-CM | POA: Diagnosis not present

## 2023-07-23 DIAGNOSIS — G20B1 Parkinson's disease with dyskinesia, without mention of fluctuations: Secondary | ICD-10-CM | POA: Diagnosis not present

## 2023-07-26 DIAGNOSIS — G20B1 Parkinson's disease with dyskinesia, without mention of fluctuations: Secondary | ICD-10-CM | POA: Diagnosis not present

## 2023-07-30 DIAGNOSIS — G20A1 Parkinson's disease without dyskinesia, without mention of fluctuations: Secondary | ICD-10-CM | POA: Diagnosis not present

## 2023-07-30 DIAGNOSIS — Z951 Presence of aortocoronary bypass graft: Secondary | ICD-10-CM | POA: Diagnosis not present

## 2023-07-30 DIAGNOSIS — I10 Essential (primary) hypertension: Secondary | ICD-10-CM | POA: Diagnosis not present

## 2023-07-30 DIAGNOSIS — I2510 Atherosclerotic heart disease of native coronary artery without angina pectoris: Secondary | ICD-10-CM | POA: Diagnosis not present

## 2023-07-30 DIAGNOSIS — E785 Hyperlipidemia, unspecified: Secondary | ICD-10-CM | POA: Diagnosis not present

## 2023-07-30 DIAGNOSIS — I491 Atrial premature depolarization: Secondary | ICD-10-CM | POA: Diagnosis not present

## 2023-07-30 DIAGNOSIS — I350 Nonrheumatic aortic (valve) stenosis: Secondary | ICD-10-CM | POA: Diagnosis not present

## 2023-07-31 ENCOUNTER — Other Ambulatory Visit: Payer: Self-pay | Admitting: Internal Medicine

## 2023-07-31 NOTE — Telephone Encounter (Signed)
Pcp: none  Attempted to reach pt. Left a voicemail to contact us back.

## 2023-08-01 ENCOUNTER — Telehealth: Payer: Self-pay

## 2023-08-01 DIAGNOSIS — I350 Nonrheumatic aortic (valve) stenosis: Secondary | ICD-10-CM | POA: Diagnosis not present

## 2023-08-01 DIAGNOSIS — G20A1 Parkinson's disease without dyskinesia, without mention of fluctuations: Secondary | ICD-10-CM | POA: Diagnosis not present

## 2023-08-01 DIAGNOSIS — I10 Essential (primary) hypertension: Secondary | ICD-10-CM | POA: Diagnosis not present

## 2023-08-01 DIAGNOSIS — I491 Atrial premature depolarization: Secondary | ICD-10-CM | POA: Diagnosis not present

## 2023-08-01 DIAGNOSIS — I2510 Atherosclerotic heart disease of native coronary artery without angina pectoris: Secondary | ICD-10-CM | POA: Diagnosis not present

## 2023-08-01 DIAGNOSIS — E785 Hyperlipidemia, unspecified: Secondary | ICD-10-CM | POA: Diagnosis not present

## 2023-08-01 NOTE — Telephone Encounter (Signed)
Contacted pt and spoke to wife( on [place]). Wife reports they moved to [place] and see cardiologist  for the medication in [place]. She continues that we should not be receiving a request from [facility]. Also in fact pt cardiologist put pt off of medication.   Wife states she will contact [facility] and make sure they don't send refill to our office. She thank for calling.

## 2023-08-01 NOTE — Telephone Encounter (Signed)
Spoke to pt wife. Please see phone encounter.

## 2023-08-06 DIAGNOSIS — G20B1 Parkinson's disease with dyskinesia, without mention of fluctuations: Secondary | ICD-10-CM | POA: Diagnosis not present

## 2023-08-08 DIAGNOSIS — G20B1 Parkinson's disease with dyskinesia, without mention of fluctuations: Secondary | ICD-10-CM | POA: Diagnosis not present

## 2023-08-10 DIAGNOSIS — R300 Dysuria: Secondary | ICD-10-CM | POA: Diagnosis not present

## 2023-08-10 DIAGNOSIS — K219 Gastro-esophageal reflux disease without esophagitis: Secondary | ICD-10-CM | POA: Diagnosis not present

## 2023-08-15 DIAGNOSIS — G20C Parkinsonism, unspecified: Secondary | ICD-10-CM | POA: Diagnosis not present

## 2023-08-17 DIAGNOSIS — N390 Urinary tract infection, site not specified: Secondary | ICD-10-CM | POA: Diagnosis not present

## 2023-08-17 DIAGNOSIS — N400 Enlarged prostate without lower urinary tract symptoms: Secondary | ICD-10-CM | POA: Diagnosis not present

## 2023-08-17 DIAGNOSIS — N210 Calculus in bladder: Secondary | ICD-10-CM | POA: Diagnosis not present

## 2023-08-17 DIAGNOSIS — R32 Unspecified urinary incontinence: Secondary | ICD-10-CM | POA: Diagnosis not present

## 2023-08-17 DIAGNOSIS — R300 Dysuria: Secondary | ICD-10-CM | POA: Diagnosis not present

## 2023-08-17 DIAGNOSIS — N410 Acute prostatitis: Secondary | ICD-10-CM | POA: Diagnosis not present

## 2023-08-24 DIAGNOSIS — N410 Acute prostatitis: Secondary | ICD-10-CM | POA: Diagnosis not present

## 2023-08-24 DIAGNOSIS — R300 Dysuria: Secondary | ICD-10-CM | POA: Diagnosis not present

## 2023-08-24 DIAGNOSIS — N400 Enlarged prostate without lower urinary tract symptoms: Secondary | ICD-10-CM | POA: Diagnosis not present

## 2023-09-05 DIAGNOSIS — N3946 Mixed incontinence: Secondary | ICD-10-CM | POA: Diagnosis not present

## 2023-09-05 DIAGNOSIS — N210 Calculus in bladder: Secondary | ICD-10-CM | POA: Diagnosis not present

## 2023-09-05 DIAGNOSIS — N400 Enlarged prostate without lower urinary tract symptoms: Secondary | ICD-10-CM | POA: Diagnosis not present

## 2023-09-05 DIAGNOSIS — R300 Dysuria: Secondary | ICD-10-CM | POA: Diagnosis not present

## 2023-09-19 DIAGNOSIS — Z951 Presence of aortocoronary bypass graft: Secondary | ICD-10-CM | POA: Diagnosis not present

## 2023-09-19 DIAGNOSIS — S51012A Laceration without foreign body of left elbow, initial encounter: Secondary | ICD-10-CM | POA: Diagnosis not present

## 2023-09-19 DIAGNOSIS — R531 Weakness: Secondary | ICD-10-CM | POA: Diagnosis not present

## 2023-09-19 DIAGNOSIS — R4020 Unspecified coma: Secondary | ICD-10-CM | POA: Diagnosis not present

## 2023-09-19 DIAGNOSIS — M25511 Pain in right shoulder: Secondary | ICD-10-CM | POA: Diagnosis not present

## 2023-09-19 DIAGNOSIS — R55 Syncope and collapse: Secondary | ICD-10-CM | POA: Diagnosis not present

## 2023-09-19 DIAGNOSIS — I491 Atrial premature depolarization: Secondary | ICD-10-CM | POA: Diagnosis not present

## 2023-09-19 DIAGNOSIS — Z23 Encounter for immunization: Secondary | ICD-10-CM | POA: Diagnosis not present

## 2023-09-19 DIAGNOSIS — Z87891 Personal history of nicotine dependence: Secondary | ICD-10-CM | POA: Diagnosis not present

## 2023-09-19 DIAGNOSIS — W19XXXA Unspecified fall, initial encounter: Secondary | ICD-10-CM | POA: Diagnosis not present

## 2023-09-19 DIAGNOSIS — Z823 Family history of stroke: Secondary | ICD-10-CM | POA: Diagnosis not present

## 2023-09-19 DIAGNOSIS — I2510 Atherosclerotic heart disease of native coronary artery without angina pectoris: Secondary | ICD-10-CM | POA: Diagnosis not present

## 2023-09-19 DIAGNOSIS — I350 Nonrheumatic aortic (valve) stenosis: Secondary | ICD-10-CM | POA: Diagnosis not present

## 2023-09-19 DIAGNOSIS — I10 Essential (primary) hypertension: Secondary | ICD-10-CM | POA: Diagnosis not present

## 2023-09-19 DIAGNOSIS — I6523 Occlusion and stenosis of bilateral carotid arteries: Secondary | ICD-10-CM | POA: Diagnosis not present

## 2023-09-19 DIAGNOSIS — I119 Hypertensive heart disease without heart failure: Secondary | ICD-10-CM | POA: Diagnosis not present

## 2023-09-19 DIAGNOSIS — S34109A Unspecified injury to unspecified level of lumbar spinal cord, initial encounter: Secondary | ICD-10-CM | POA: Diagnosis not present

## 2023-09-19 DIAGNOSIS — G20A1 Parkinson's disease without dyskinesia, without mention of fluctuations: Secondary | ICD-10-CM | POA: Diagnosis not present

## 2023-09-19 DIAGNOSIS — G40109 Localization-related (focal) (partial) symptomatic epilepsy and epileptic syndromes with simple partial seizures, not intractable, without status epilepticus: Secondary | ICD-10-CM | POA: Diagnosis not present

## 2023-09-19 DIAGNOSIS — R4182 Altered mental status, unspecified: Secondary | ICD-10-CM | POA: Diagnosis not present

## 2023-09-19 DIAGNOSIS — I2699 Other pulmonary embolism without acute cor pulmonale: Secondary | ICD-10-CM | POA: Diagnosis not present

## 2023-09-19 DIAGNOSIS — S4991XA Unspecified injury of right shoulder and upper arm, initial encounter: Secondary | ICD-10-CM | POA: Diagnosis not present

## 2023-09-19 DIAGNOSIS — S0990XA Unspecified injury of head, initial encounter: Secondary | ICD-10-CM | POA: Diagnosis not present

## 2023-09-19 DIAGNOSIS — E785 Hyperlipidemia, unspecified: Secondary | ICD-10-CM | POA: Diagnosis not present

## 2023-09-19 DIAGNOSIS — G20C Parkinsonism, unspecified: Secondary | ICD-10-CM | POA: Diagnosis not present

## 2023-09-19 DIAGNOSIS — Z7982 Long term (current) use of aspirin: Secondary | ICD-10-CM | POA: Diagnosis not present

## 2023-09-19 DIAGNOSIS — R7989 Other specified abnormal findings of blood chemistry: Secondary | ICD-10-CM | POA: Diagnosis not present

## 2023-09-19 DIAGNOSIS — S299XXA Unspecified injury of thorax, initial encounter: Secondary | ICD-10-CM | POA: Diagnosis not present

## 2023-09-19 DIAGNOSIS — R9431 Abnormal electrocardiogram [ECG] [EKG]: Secondary | ICD-10-CM | POA: Diagnosis not present

## 2023-09-20 DIAGNOSIS — R55 Syncope and collapse: Secondary | ICD-10-CM | POA: Diagnosis not present

## 2023-09-20 DIAGNOSIS — R9431 Abnormal electrocardiogram [ECG] [EKG]: Secondary | ICD-10-CM | POA: Diagnosis not present

## 2023-09-20 DIAGNOSIS — I6523 Occlusion and stenosis of bilateral carotid arteries: Secondary | ICD-10-CM | POA: Diagnosis not present

## 2023-09-28 DIAGNOSIS — G20C Parkinsonism, unspecified: Secondary | ICD-10-CM | POA: Diagnosis not present

## 2023-10-04 DIAGNOSIS — Z87891 Personal history of nicotine dependence: Secondary | ICD-10-CM | POA: Diagnosis not present

## 2023-10-04 DIAGNOSIS — W260XXA Contact with knife, initial encounter: Secondary | ICD-10-CM | POA: Diagnosis not present

## 2023-10-04 DIAGNOSIS — S61219A Laceration without foreign body of unspecified finger without damage to nail, initial encounter: Secondary | ICD-10-CM | POA: Diagnosis not present

## 2023-10-04 DIAGNOSIS — Z23 Encounter for immunization: Secondary | ICD-10-CM | POA: Diagnosis not present

## 2023-10-04 DIAGNOSIS — S61211A Laceration without foreign body of left index finger without damage to nail, initial encounter: Secondary | ICD-10-CM | POA: Diagnosis not present

## 2023-10-05 DIAGNOSIS — R55 Syncope and collapse: Secondary | ICD-10-CM | POA: Diagnosis not present

## 2023-10-09 DIAGNOSIS — I350 Nonrheumatic aortic (valve) stenosis: Secondary | ICD-10-CM | POA: Diagnosis not present

## 2023-10-09 DIAGNOSIS — E785 Hyperlipidemia, unspecified: Secondary | ICD-10-CM | POA: Diagnosis not present

## 2023-10-09 DIAGNOSIS — I2510 Atherosclerotic heart disease of native coronary artery without angina pectoris: Secondary | ICD-10-CM | POA: Diagnosis not present

## 2023-10-09 DIAGNOSIS — R55 Syncope and collapse: Secondary | ICD-10-CM | POA: Diagnosis not present

## 2023-10-23 DIAGNOSIS — G20C Parkinsonism, unspecified: Secondary | ICD-10-CM | POA: Diagnosis not present

## 2023-10-23 DIAGNOSIS — R9089 Other abnormal findings on diagnostic imaging of central nervous system: Secondary | ICD-10-CM | POA: Diagnosis not present

## 2023-11-26 DIAGNOSIS — G20C Parkinsonism, unspecified: Secondary | ICD-10-CM | POA: Diagnosis not present

## 2023-11-28 DIAGNOSIS — R4182 Altered mental status, unspecified: Secondary | ICD-10-CM | POA: Diagnosis not present

## 2023-11-28 DIAGNOSIS — G20C Parkinsonism, unspecified: Secondary | ICD-10-CM | POA: Diagnosis not present

## 2023-11-28 DIAGNOSIS — Z87891 Personal history of nicotine dependence: Secondary | ICD-10-CM | POA: Diagnosis not present

## 2023-11-28 DIAGNOSIS — R9431 Abnormal electrocardiogram [ECG] [EKG]: Secondary | ICD-10-CM | POA: Diagnosis not present

## 2023-11-28 DIAGNOSIS — R5383 Other fatigue: Secondary | ICD-10-CM | POA: Diagnosis not present

## 2023-11-28 DIAGNOSIS — R739 Hyperglycemia, unspecified: Secondary | ICD-10-CM | POA: Diagnosis not present

## 2024-01-07 DIAGNOSIS — R1319 Other dysphagia: Secondary | ICD-10-CM | POA: Diagnosis not present

## 2024-01-07 DIAGNOSIS — Z0000 Encounter for general adult medical examination without abnormal findings: Secondary | ICD-10-CM | POA: Diagnosis not present

## 2024-01-07 DIAGNOSIS — G909 Disorder of the autonomic nervous system, unspecified: Secondary | ICD-10-CM | POA: Diagnosis not present

## 2024-01-07 DIAGNOSIS — N319 Neuromuscular dysfunction of bladder, unspecified: Secondary | ICD-10-CM | POA: Diagnosis not present

## 2024-01-07 DIAGNOSIS — R0982 Postnasal drip: Secondary | ICD-10-CM | POA: Diagnosis not present

## 2024-01-07 DIAGNOSIS — K219 Gastro-esophageal reflux disease without esophagitis: Secondary | ICD-10-CM | POA: Diagnosis not present

## 2024-01-07 DIAGNOSIS — G20B2 Parkinson's disease with dyskinesia, with fluctuations: Secondary | ICD-10-CM | POA: Diagnosis not present

## 2024-03-29 DIAGNOSIS — Z7982 Long term (current) use of aspirin: Secondary | ICD-10-CM | POA: Diagnosis not present

## 2024-03-29 DIAGNOSIS — R5383 Other fatigue: Secondary | ICD-10-CM | POA: Diagnosis not present

## 2024-03-29 DIAGNOSIS — G20A1 Parkinson's disease without dyskinesia, without mention of fluctuations: Secondary | ICD-10-CM | POA: Diagnosis not present

## 2024-03-29 DIAGNOSIS — R569 Unspecified convulsions: Secondary | ICD-10-CM | POA: Diagnosis not present

## 2024-03-29 DIAGNOSIS — R42 Dizziness and giddiness: Secondary | ICD-10-CM | POA: Diagnosis not present

## 2024-03-29 DIAGNOSIS — I10 Essential (primary) hypertension: Secondary | ICD-10-CM | POA: Diagnosis not present

## 2024-03-29 DIAGNOSIS — I2510 Atherosclerotic heart disease of native coronary artery without angina pectoris: Secondary | ICD-10-CM | POA: Diagnosis not present

## 2024-03-29 DIAGNOSIS — Z79899 Other long term (current) drug therapy: Secondary | ICD-10-CM | POA: Diagnosis not present

## 2024-03-29 DIAGNOSIS — E785 Hyperlipidemia, unspecified: Secondary | ICD-10-CM | POA: Diagnosis not present

## 2024-04-03 DIAGNOSIS — I2510 Atherosclerotic heart disease of native coronary artery without angina pectoris: Secondary | ICD-10-CM | POA: Diagnosis not present

## 2024-04-03 DIAGNOSIS — E785 Hyperlipidemia, unspecified: Secondary | ICD-10-CM | POA: Diagnosis not present

## 2024-04-03 DIAGNOSIS — R55 Syncope and collapse: Secondary | ICD-10-CM | POA: Diagnosis not present

## 2024-04-03 DIAGNOSIS — I10 Essential (primary) hypertension: Secondary | ICD-10-CM | POA: Diagnosis not present

## 2024-04-04 DIAGNOSIS — G20C Parkinsonism, unspecified: Secondary | ICD-10-CM | POA: Diagnosis not present

## 2024-04-07 DIAGNOSIS — I259 Chronic ischemic heart disease, unspecified: Secondary | ICD-10-CM | POA: Diagnosis not present

## 2024-04-07 DIAGNOSIS — G909 Disorder of the autonomic nervous system, unspecified: Secondary | ICD-10-CM | POA: Diagnosis not present

## 2024-04-07 DIAGNOSIS — R569 Unspecified convulsions: Secondary | ICD-10-CM | POA: Diagnosis not present

## 2024-04-11 DIAGNOSIS — L0390 Cellulitis, unspecified: Secondary | ICD-10-CM | POA: Diagnosis not present

## 2024-04-11 DIAGNOSIS — A498 Other bacterial infections of unspecified site: Secondary | ICD-10-CM | POA: Diagnosis not present

## 2024-04-16 DIAGNOSIS — L723 Sebaceous cyst: Secondary | ICD-10-CM | POA: Diagnosis not present

## 2024-04-24 DIAGNOSIS — R55 Syncope and collapse: Secondary | ICD-10-CM | POA: Diagnosis not present

## 2024-07-10 DIAGNOSIS — G20C Parkinsonism, unspecified: Secondary | ICD-10-CM | POA: Diagnosis not present

## 2024-07-11 DIAGNOSIS — N400 Enlarged prostate without lower urinary tract symptoms: Secondary | ICD-10-CM | POA: Diagnosis not present

## 2024-07-11 DIAGNOSIS — R300 Dysuria: Secondary | ICD-10-CM | POA: Diagnosis not present

## 2024-07-14 DIAGNOSIS — R569 Unspecified convulsions: Secondary | ICD-10-CM | POA: Diagnosis not present

## 2024-07-14 DIAGNOSIS — G909 Disorder of the autonomic nervous system, unspecified: Secondary | ICD-10-CM | POA: Diagnosis not present

## 2024-07-14 DIAGNOSIS — Z1283 Encounter for screening for malignant neoplasm of skin: Secondary | ICD-10-CM | POA: Diagnosis not present

## 2024-07-14 DIAGNOSIS — K219 Gastro-esophageal reflux disease without esophagitis: Secondary | ICD-10-CM | POA: Diagnosis not present

## 2024-07-21 DIAGNOSIS — E785 Hyperlipidemia, unspecified: Secondary | ICD-10-CM | POA: Diagnosis not present

## 2024-07-21 DIAGNOSIS — I2510 Atherosclerotic heart disease of native coronary artery without angina pectoris: Secondary | ICD-10-CM | POA: Diagnosis not present

## 2024-07-21 DIAGNOSIS — I350 Nonrheumatic aortic (valve) stenosis: Secondary | ICD-10-CM | POA: Diagnosis not present

## 2024-07-25 DIAGNOSIS — N390 Urinary tract infection, site not specified: Secondary | ICD-10-CM | POA: Diagnosis not present

## 2024-07-29 DIAGNOSIS — Z0389 Encounter for observation for other suspected diseases and conditions ruled out: Secondary | ICD-10-CM | POA: Diagnosis not present

## 2024-07-29 DIAGNOSIS — H04123 Dry eye syndrome of bilateral lacrimal glands: Secondary | ICD-10-CM | POA: Diagnosis not present

## 2024-08-20 DIAGNOSIS — R300 Dysuria: Secondary | ICD-10-CM | POA: Diagnosis not present

## 2024-08-20 DIAGNOSIS — N400 Enlarged prostate without lower urinary tract symptoms: Secondary | ICD-10-CM | POA: Diagnosis not present

## 2024-08-20 DIAGNOSIS — Z112 Encounter for screening for other bacterial diseases: Secondary | ICD-10-CM | POA: Diagnosis not present

## 2024-08-20 DIAGNOSIS — R350 Frequency of micturition: Secondary | ICD-10-CM | POA: Diagnosis not present

## 2024-09-04 DIAGNOSIS — L720 Epidermal cyst: Secondary | ICD-10-CM | POA: Diagnosis not present

## 2024-09-04 DIAGNOSIS — L2989 Other pruritus: Secondary | ICD-10-CM | POA: Diagnosis not present

## 2024-09-11 ENCOUNTER — Other Ambulatory Visit: Payer: Self-pay | Admitting: Internal Medicine

## 2024-09-11 NOTE — Telephone Encounter (Signed)
 Attempted to reach pt. Left a voicemail to call us back.

## 2024-10-06 DIAGNOSIS — Z125 Encounter for screening for malignant neoplasm of prostate: Secondary | ICD-10-CM | POA: Diagnosis not present

## 2024-10-06 DIAGNOSIS — G20C Parkinsonism, unspecified: Secondary | ICD-10-CM | POA: Diagnosis not present

## 2024-10-06 DIAGNOSIS — I350 Nonrheumatic aortic (valve) stenosis: Secondary | ICD-10-CM | POA: Diagnosis not present

## 2024-10-06 DIAGNOSIS — R739 Hyperglycemia, unspecified: Secondary | ICD-10-CM | POA: Diagnosis not present

## 2024-10-06 DIAGNOSIS — E785 Hyperlipidemia, unspecified: Secondary | ICD-10-CM | POA: Diagnosis not present

## 2024-10-06 DIAGNOSIS — N3281 Overactive bladder: Secondary | ICD-10-CM | POA: Diagnosis not present

## 2024-10-06 DIAGNOSIS — I2510 Atherosclerotic heart disease of native coronary artery without angina pectoris: Secondary | ICD-10-CM | POA: Diagnosis not present

## 2024-10-06 DIAGNOSIS — R7989 Other specified abnormal findings of blood chemistry: Secondary | ICD-10-CM | POA: Diagnosis not present

## 2024-10-09 DIAGNOSIS — Z23 Encounter for immunization: Secondary | ICD-10-CM | POA: Diagnosis not present

## 2024-10-10 DIAGNOSIS — G20C Parkinsonism, unspecified: Secondary | ICD-10-CM | POA: Diagnosis not present

## 2024-10-20 DIAGNOSIS — I10 Essential (primary) hypertension: Secondary | ICD-10-CM | POA: Diagnosis not present

## 2024-10-21 DIAGNOSIS — I2510 Atherosclerotic heart disease of native coronary artery without angina pectoris: Secondary | ICD-10-CM | POA: Diagnosis not present

## 2024-11-07 DIAGNOSIS — I10 Essential (primary) hypertension: Secondary | ICD-10-CM | POA: Diagnosis not present

## 2024-12-10 DIAGNOSIS — R55 Syncope and collapse: Secondary | ICD-10-CM | POA: Diagnosis not present

## 2024-12-10 DIAGNOSIS — Z743 Need for continuous supervision: Secondary | ICD-10-CM | POA: Diagnosis not present
